# Patient Record
Sex: Male | Born: 1935 | Race: White | Hispanic: No | Smoking: Former smoker
Health system: Southern US, Community
[De-identification: ages and names within clinical notes are randomized; demographics above are authoritative.]

## PROBLEM LIST (undated history)

## (undated) ENCOUNTER — Emergency Department (HOSPITAL_BASED_OUTPATIENT_CLINIC_OR_DEPARTMENT_OTHER)
Admission: EM | Payer: Medicare Other | Source: Home / Self Care | Attending: Emergency Medicine | Admitting: Emergency Medicine

## (undated) DIAGNOSIS — E119 Type 2 diabetes mellitus without complications: Secondary | ICD-10-CM

## (undated) DIAGNOSIS — I10 Essential (primary) hypertension: Secondary | ICD-10-CM

## (undated) DIAGNOSIS — E113599 Type 2 diabetes mellitus with proliferative diabetic retinopathy without macular edema, unspecified eye: Secondary | ICD-10-CM

## (undated) DIAGNOSIS — I714 Abdominal aortic aneurysm, without rupture, unspecified: Secondary | ICD-10-CM

## (undated) DIAGNOSIS — Z794 Long term (current) use of insulin: Secondary | ICD-10-CM

## (undated) DIAGNOSIS — I4821 Permanent atrial fibrillation: Secondary | ICD-10-CM

## (undated) DIAGNOSIS — Z7901 Long term (current) use of anticoagulants: Secondary | ICD-10-CM

## (undated) DIAGNOSIS — Z951 Presence of aortocoronary bypass graft: Secondary | ICD-10-CM

## (undated) DIAGNOSIS — N401 Enlarged prostate with lower urinary tract symptoms: Secondary | ICD-10-CM

## (undated) DIAGNOSIS — H9190 Unspecified hearing loss, unspecified ear: Secondary | ICD-10-CM

## (undated) DIAGNOSIS — M5030 Other cervical disc degeneration, unspecified cervical region: Secondary | ICD-10-CM

## (undated) DIAGNOSIS — N471 Phimosis: Secondary | ICD-10-CM

## (undated) DIAGNOSIS — E785 Hyperlipidemia, unspecified: Secondary | ICD-10-CM

## (undated) DIAGNOSIS — I2510 Atherosclerotic heart disease of native coronary artery without angina pectoris: Secondary | ICD-10-CM

## (undated) DIAGNOSIS — R3916 Straining to void: Secondary | ICD-10-CM

## (undated) DIAGNOSIS — Z972 Presence of dental prosthetic device (complete) (partial): Secondary | ICD-10-CM

## (undated) DIAGNOSIS — K08109 Complete loss of teeth, unspecified cause, unspecified class: Secondary | ICD-10-CM

## (undated) DIAGNOSIS — Z923 Personal history of irradiation: Secondary | ICD-10-CM

## (undated) DIAGNOSIS — C61 Malignant neoplasm of prostate: Secondary | ICD-10-CM

## (undated) HISTORY — PX: KNEE ARTHROSCOPY: SUR90

## (undated) HISTORY — DX: Abdominal aortic aneurysm, without rupture, unspecified: I71.40

## (undated) HISTORY — DX: Hyperlipidemia, unspecified: E78.5

## (undated) HISTORY — DX: Abdominal aortic aneurysm, without rupture: I71.4

## (undated) HISTORY — PX: CARDIAC CATHETERIZATION: SHX172

## (undated) HISTORY — DX: Atherosclerotic heart disease of native coronary artery without angina pectoris: I25.10

## (undated) HISTORY — PX: CARPAL TUNNEL RELEASE: SHX101

## (undated) HISTORY — DX: Malignant neoplasm of prostate: C61

## (undated) HISTORY — PX: CATARACT EXTRACTION W/ INTRAOCULAR LENS  IMPLANT, BILATERAL: SHX1307

---

## 1988-11-23 HISTORY — PX: CORONARY ANGIOPLASTY: SHX604

## 1995-11-24 DIAGNOSIS — Z951 Presence of aortocoronary bypass graft: Secondary | ICD-10-CM

## 1995-11-24 HISTORY — DX: Presence of aortocoronary bypass graft: Z95.1

## 1995-11-24 HISTORY — PX: CORONARY ARTERY BYPASS GRAFT: SHX141

## 2004-01-18 ENCOUNTER — Ambulatory Visit (HOSPITAL_COMMUNITY): Admission: RE | Admit: 2004-01-18 | Discharge: 2004-01-18 | Payer: Self-pay | Admitting: Cardiology

## 2004-09-23 ENCOUNTER — Ambulatory Visit (HOSPITAL_COMMUNITY): Admission: RE | Admit: 2004-09-23 | Discharge: 2004-09-24 | Payer: Self-pay | Admitting: Ophthalmology

## 2004-12-26 ENCOUNTER — Ambulatory Visit (HOSPITAL_COMMUNITY): Admission: RE | Admit: 2004-12-26 | Discharge: 2004-12-26 | Payer: Self-pay | Admitting: Ophthalmology

## 2010-05-12 ENCOUNTER — Emergency Department (HOSPITAL_COMMUNITY): Admission: EM | Admit: 2010-05-12 | Discharge: 2010-05-12 | Payer: Self-pay | Admitting: Emergency Medicine

## 2010-06-24 ENCOUNTER — Ambulatory Visit: Payer: Self-pay | Admitting: Cardiology

## 2010-12-25 ENCOUNTER — Ambulatory Visit (INDEPENDENT_AMBULATORY_CARE_PROVIDER_SITE_OTHER): Payer: Medicare Other | Admitting: Cardiology

## 2010-12-25 DIAGNOSIS — E119 Type 2 diabetes mellitus without complications: Secondary | ICD-10-CM

## 2010-12-25 DIAGNOSIS — I2510 Atherosclerotic heart disease of native coronary artery without angina pectoris: Secondary | ICD-10-CM

## 2011-01-05 ENCOUNTER — Telehealth (INDEPENDENT_AMBULATORY_CARE_PROVIDER_SITE_OTHER): Payer: Self-pay | Admitting: *Deleted

## 2011-01-07 ENCOUNTER — Encounter: Payer: Self-pay | Admitting: Cardiology

## 2011-01-07 ENCOUNTER — Ambulatory Visit (HOSPITAL_COMMUNITY): Payer: Medicare Other | Attending: Cardiology

## 2011-01-07 DIAGNOSIS — R0609 Other forms of dyspnea: Secondary | ICD-10-CM

## 2011-01-07 DIAGNOSIS — R0989 Other specified symptoms and signs involving the circulatory and respiratory systems: Secondary | ICD-10-CM

## 2011-01-07 DIAGNOSIS — I2510 Atherosclerotic heart disease of native coronary artery without angina pectoris: Secondary | ICD-10-CM

## 2011-01-07 DIAGNOSIS — R079 Chest pain, unspecified: Secondary | ICD-10-CM

## 2011-01-07 HISTORY — PX: CARDIOVASCULAR STRESS TEST: SHX262

## 2011-01-14 NOTE — Progress Notes (Signed)
Summary: Nuclear Pre-Procedure  Phone Note Outgoing Call Call back at [facility] Phone [phone]   Call placed by: Stanton Kidney, EMT-P,  January 05, 2011 2:29 PM Call placed to: Patient Action Taken: Phone Call Completed Summary of Call: Reviewed information on [employer] Information Sheet (see scanned document for further details).  Spoke with the patient and his wife. Stanton Kidney, EMT-P  January 05, 2011 2:29 PM     Nuclear Med Background Indications for Stress Test: Evaluation for Ischemia, Graft Patency, PTCA Patency   History: Angioplasty, CABG, Heart Catheterization, Myocardial Perfusion Study  History Comments: '90 Angioplasty '97 CABG 2/10 MPS: EF=60%  Symptoms: Chest Pain, DOE    Nuclear Pre-Procedure Cardiac Risk Factors: Family History - CAD, History of Smoking, IDDM Type 2, Lipids

## 2011-01-14 NOTE — Assessment & Plan Note (Signed)
Summary: Cardiology Nuclear Testing  Nuclear Med Background Indications for Stress Test: Evaluation for Ischemia, Graft Patency, PTCA Patency   History: Angioplasty, CABG, Heart Catheterization, Myocardial Perfusion Study  History Comments: '90 Angioplasty '97 CABG 2/10 MPS: EF=60%  Symptoms: Chest Pain, DOE  Symptoms Comments: Last episode of CP > 1 month ago   Nuclear Pre-Procedure Cardiac Risk Factors: Family History - CAD, History of Smoking, IDDM Type 2, Lipids Caffeine/Decaff Intake: None NPO After: 9:00 PM Lungs: Clear IV 0.9% NS with Angio Cath: 18g     IV Site: R Antecubital IV Started by: Stanton Kidney, EMT-P Chest Size (in) 44     Height (in): 72 Weight (lb): 217 BMI: 29.54  Nuclear Med Study 1 or 2 day study:  1 day     Stress Test Type:  Treadmill/Lexiscan Reading MD:  Marca Ancona, MD     Referring MD:  Dr. Roger Shelter Resting Radionuclide:  Technetium 6m Tetrofosmin     Resting Radionuclide Dose:  11.0 mCi  Stress Radionuclide:  Technetium 24m Tetrofosmin     Stress Radionuclide Dose:  33.0 mCi   Stress Protocol Exercise Time (min):  2:00 min     Max HR:  98 bpm     Predicted Max HR:  146 bpm  Max Systolic BP: 183 mm Hg     Percent Max HR:  67.12 %     METS: 1.6 Rate Pressure Product:  53664  Lexiscan: 0.4 mg   Stress Test Technologist:  Bonnita Levan, RN     Nuclear Technologist:  Doyne Keel, CNMT  Rest Procedure  Myocardial perfusion imaging was performed at rest 45 minutes following the intravenous administration of Technetium 16m Tetrofosmin.  Stress Procedure  The patient received IV Lexiscan 0.4 mg over 15-seconds with concurrent low level exercise and then Technetium 32m Tetrofosmin was injected at 30-seconds while the patient continued walking one more minute.  There were no significant changes with Lexiscan.  Quantitative spect images were obtained after a 45 minute delay.  QPS Raw Data Images:  Normal; no motion artifact; normal  heart/lung ratio. Stress Images:  Normal homogeneous uptake in all areas of the myocardium. Rest Images:  Normal homogeneous uptake in all areas of the myocardium. Subtraction (SDS):  There is no evidence of scar or ischemia. Transient Ischemic Dilatation:  1.16  (Normal <1.22)  Lung/Heart Ratio:  0.28  (Normal <0.45)  Quantitative Gated Spect Images QGS EDV:  129 ml QGS ESV:  51 ml QGS EF:  60 % QGS cine images:  Normal wall motion.    Overall Impression  Exercise Capacity: Lexiscan with no exercise. BP Response: Normal blood pressure response. Clinical Symptoms: Lightheaded ECG Impression: No significant ST segment change suggestive of ischemia. Overall Impression: Normal stress nuclear study.  Appended Document: Cardiology Nuclear Testing copy sent to DR. TENNNAT  Appended Document: Cardiology Nuclear Testing COPY SENT TO DR. Deborah Chalk

## 2011-02-19 ENCOUNTER — Emergency Department (HOSPITAL_COMMUNITY)
Admission: EM | Admit: 2011-02-19 | Discharge: 2011-02-19 | Disposition: A | Discharge status: Home / Self Care | Payer: Medicare Other | Attending: Emergency Medicine | Admitting: Emergency Medicine

## 2011-02-19 ENCOUNTER — Emergency Department (HOSPITAL_COMMUNITY): Payer: Medicare Other

## 2011-02-19 DIAGNOSIS — S61209A Unspecified open wound of unspecified finger without damage to nail, initial encounter: Secondary | ICD-10-CM | POA: Insufficient documentation

## 2011-02-19 DIAGNOSIS — E119 Type 2 diabetes mellitus without complications: Secondary | ICD-10-CM | POA: Insufficient documentation

## 2011-02-19 DIAGNOSIS — W269XXA Contact with unspecified sharp object(s), initial encounter: Secondary | ICD-10-CM | POA: Insufficient documentation

## 2011-02-19 DIAGNOSIS — I10 Essential (primary) hypertension: Secondary | ICD-10-CM | POA: Insufficient documentation

## 2011-02-19 DIAGNOSIS — Z79899 Other long term (current) drug therapy: Secondary | ICD-10-CM | POA: Insufficient documentation

## 2011-02-19 DIAGNOSIS — Y92009 Unspecified place in unspecified non-institutional (private) residence as the place of occurrence of the external cause: Secondary | ICD-10-CM | POA: Insufficient documentation

## 2011-02-19 LAB — GLUCOSE, CAPILLARY: Glucose-Capillary: 157 mg/dL — ABNORMAL HIGH (ref 70–99)

## 2011-04-10 NOTE — Op Note (Signed)
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   POSTOPERATIVE DIAGNOSIS:  Chronic and recurrent vitreous hemorrhage left  eye.   OPERATION:  Pars plana vitrectomy using vitreous infusion suction cutter,  preretinal membrane peeling and excision and endolaser panretinal  photocoagulation.   SURGEON:  Guadelupe Sabin, M.D.   ASSISTANT:  Nurse.   ANESTHESIA:  General anesthesia.   OPHTHALMOSCOPY:  As previously described.   DESCRIPTION OF PROCEDURE:  After the patient was prepped and draped, a lid  speculum was inserted in the left eye.  The eye was turned downward and a  superior rectus traction suture placed.  A peritomy was performed adjacent  to the limbus from the 9 to 4 o'clock position.  The subconjunctival tissue  was cleaned and three sclerotomy sites prepared with the MVR blade 3.5 mm  from the limbus.  The 4 mm vitreous infusion terminal was secured in place  at the 4 o'clock position with the 5.0 gree Mersilene suture.  The  fiberoptic light pipe was inserted at the 2 o'clock position and the  handpiece of the vitreous infusion suction cutter at the 10 o'clock  position.  Slow vitreous infusion suction cutting were begun from an  anterior to posterior direction.  Dense vitreous hemorrhage was present.  This was slowly aspirated.  Scleral depression was used along the inferior  retina.  As the vitreous began to clear, the underlying retinal details were  seen.  There appeared to be a proliferative vascular stalk at the optic  nerve extending along the inferior temporal retinal blood vessel.   This was  slowly removed, peeled from the retinal service.  There appeared to be some  slight vitreous bleeding along the inferior temporal blood vessel.  The  pressure in the eye was slightly elevated to 30 mm and this gradually  stopped.  The surface blood was aspirated with the silicone tipped aspirator  tip.  The Endolaser photocoagulator was assembled and panretinal laser  photocoagulation applied.  A total number of burns 1150, duration 0.1  second, power 500 to 600 milliwatts.  Good panretinal burns were achieved.  Indirect ophthalmoscopy was then performed revealing no peripheral retinal  tears or detachment.  It was then elected to close.  The vitreous  instruments were withdrawn and the sclerotomy sites closed with 7.0  interrupted Vicryl sutures.  The conjunctiva was pulled back forward with  tenons capsule and closed with a running 7-0 Vicryl suture.  Depo Garamycin  and  dexamethasone were injected in the subtenon space inferiorly.  Maxitrol  and atropine  ointment were instilled in the conjunctival cul-de-sac.  A light patch and  protector shield were applied.  Duration of procedure and anesthesia  administration was one to one and a half hours.  The patient tolerated the  procedure well in general and left the operating room for the recovery room  and subsequently to the 23-hour observation unit.       HNJ/MEDQ  D:  09/23/2004  T:  09/23/2004  Job:  [id number]   cc:   Reather Littler, M.D.  [address], [address]  [place]  [place] [address]  Fax: [phone]

## 2011-04-10 NOTE — H&P (Signed)
Steve Bailey, Steve Bailey                ACCOUNT NO.:  [id number]   MEDICAL RECORD NO.:  [id number]          PATIENT TYPE:  OIB   LOCATION:  2899                         FACILITY:  [facility]   PHYSICIAN:  Guadelupe Sabin, M.D.DATE OF BIRTH:  07-31-1934   DATE OF ADMISSION:  09/23/2004  DATE OF DISCHARGE:                                HISTORY & PHYSICAL   REASON FOR ADMISSION:  This was a planned outpatient surgical admission of  this 77 year old insulin-dependent diabetic, admitted with a chronic  vitreous hemorrhage of the left eye and proliferative diabetic retinopathy  for vitrectomy surgery.   HISTORY OF PRESENT ILLNESS:  This patient has a long history of insulin-  dependent diabetes mellitus and secondary complications of diabetic  retinopathy.  He has been seen previously at the [facility] by  Dr. Alan Mulder and has had laser photocoagulation 2-3 times, of the left  eye.  Blurred vision has continued to be a problem.  The patient was first  seen in my office on March 10, 2000, at which time his vision was recorded  at 20/30 right eye, less than 20/400 left eye with correction.  Refraction  did not improve the vision.  Slit-lamp examination showed early cortical  cataract change greater in the right than left eye.  Dilated fundus  examination showed minimal retinopathy in the right eye and background  diabetic retinopathy with clinically significant macular edema in the left  eye with paramacular laser photocoagulation.  We felt that the patient's  condition was stable.  The patient was followed intermittently.  Recently,  however, he developed an acute vitreous hemorrhage in the left eye obscuring  his limited, but useful vision.  This has persisted without resolution and  the patient has elected to proceed with vitrectomy surgery at this time.  The patient was given oral discussion and printed information concerning the  procedure and its possible complications.  He  signed an informed consent and  arrangements were made for his outpatient admission at this time.   PAST MEDICAL HISTORY:  The patient is under the care of Dr. Reather Littler of  the [facility] and is felt to be in satisfactory  condition for the proposed surgery.  The patient also is being followed by  Dr. Deborah Chalk, his cardiologist, for arteriosclerotic heart disease, coronary  artery disease with bypass surgery, 1977.   MEDICATIONS:  The patient takes multiple medications including:  1.  Actos 45 mg daily.  2.  Humulin 70/30 at 19 units in the morning, 24 in the afternoon.  3.  The patient states that his blood sugars are relatively stable on this      regimen.   Other medications include:  1.  Vytorin.  2.  Norvasc.  3.  Toprol XL.  4.  Lisinopril.  5.  Aspirin 81 mg daily which the patient has discontinued for the      operation.   REVIEW OF SYSTEMS:  No current cardiorespiratory complaints.   PHYSICAL EXAMINATION:  VITAL SIGNS:  As recorded on admission, blood  pressure 122/69, pulse 57, respirations 18,  temperature 98.0.  GENERAL:  The patient is a pleasant well-nourished, well-developed 77-year-  old white male in no acute distress.  HEENT:  Eyes, visual acuity as noted above.  Detailed fundus examination at  this time shows a dense vitreous hemorrhage and retinal details are  obscured.  CHEST/LUNGS:  Clear to percussion and auscultation.  Central coronary artery  bypass surgical scar in the sternum.  ABDOMEN:  Negative.  EXTREMITIES:  Negative.   ADMISSION DIAGNOSIS:  1.  Chronic and recurrent vitreous hemorrhage at left eye.  2.  Proliferative diabetic retinopathy, left eye.  3.  Insulin-dependent diabetes mellitus.  4.  Arteriosclerotic heart disease.  5.  Coronary artery disease.  6.  Status post coronary artery bypass surgery.   SURGICAL PLAN:  Pars plana vitrectomy with endolaser photocoagulation and  possible membrane peeling as  needed.       HNJ/MEDQ  D:  09/23/2004  T:  09/23/2004  Job:  [id number]

## 2011-04-10 NOTE — Cardiovascular Report (Signed)
NAME:  EUAL, LINDSTROM                          ACCOUNT NO.:  [id number]   MEDICAL RECORD NO.:  [id number]                   PATIENT TYPE:  OIB   LOCATION:  2867                                 FACILITY:  [facility]   PHYSICIAN:  Colleen Can. Deborah Chalk, M.D.            DATE OF BIRTH:  Mar 24, 1934   DATE OF PROCEDURE:  01/18/2004  DATE OF DISCHARGE:                              CARDIAC CATHETERIZATION   HISTORY:  Mr. Matteucci had previous coronary artery bypass grafting in 1997.  He had an abnormal stress Cardiolite study.  He is referred for follow-up  evaluation.   PROCEDURE:  Left heart catheterization with selective coronary angiography,  left ventricular angiography, angiography of the saphenous vein grafts x3,  and angiography of the left internal mammary artery.   TYPE AND SITE OF ENTRY:  Percutaneous, right femoral artery with Perclose.   CATHETERS:  6-French 4-curved Judkins right and left coronary catheters; 6-  French pigtail ventriculographic catheter; right bypass graft catheter,  internal mammary graft catheter.   CONTRAST MATERIAL:  Omnipaque.   MEDICATIONS GIVEN PRIOR TO THE PROCEDURE:  Valium 10 mg p.o.   MEDICATIONS GIVEN DURING THE PROCEDURE:  Versed 2 mg IV, Ancef 1 g IV.   COMMENTS:  Angio-Seal was used.   HEMODYNAMIC DATA:  1. The aortic pressure was 143/71.  2. The LV was 135/11/19.  3. There was no aortic valve gradient noted on pullback.   ANGIOGRAPHIC DATA:  Left ventricular angiogram was performed in the RAO  position.  Overall cardiac size and silhouette are normal.  Global ejection  fraction is within normal range.  It is approximately 55%.  There is very  mild anterior hypokinesis.   1. Saphenous vein graft to the right coronary artery is widely patent with a     smooth body of the graft and nice insertion with good distal runoff.     There is some mild to moderate diffuse atherosclerosis in the right     coronary artery, but overall it appears  to be  satisfactory flow with no     significant obstructive disease.  There is mild diffuse distal disease in     this distribution.  2. Left internal mammary artery to the LAD is widely patent with a nice     insertion and good distal runoff.  3. Saphenous vein graft to the diagonal system has a smooth body of the     graft and a nice insertion.  It is a relatively small distribution but     excellent distal runoff.  4. Saphenous vein graft to the obtuse marginal is widely patent.  There is     satisfactory flow into the graft.  There is retrograde filling of a more     proximal and relatively large obtuse marginal.  This could be a potential     area of ischemia at some point in time but currently appears to be  well     perfused.  5. Right coronary artery:  The native right coronary artery is severely and     diffusely diseased in a segmental fashion proximally.  However, there is     antegrade flow and subsequent retrograde flow into the saphenous vein     graft.  The right coronary artery in its proximal segment is diffusely     diseased in the 70-80% segmental range.  6. Left coronary system:  The left main coronary artery is basically normal     with the left circumflex being totally occluded in its proximal portions.     The left anterior descending is totally occluded after a septal     perforating branch.  There appears to be severe stenosis before the     second diagonal, and the second diagonal is relatively small.  It appears     that the distal left anterior descending is totally occluded.  The vessel     would be too small for any attempts at percutaneous antegrade     revascularization, but it is also felt that it is not needed at this     point in time.   OVERALL IMPRESSION:  1. Severe native coronary artery disease with totally occluded left     circumflex, totally occluded left anterior descending, severe disease in     a small second diagonal vessel, and severe segmental  disease in the right     coronary artery.  2. Patent saphenous vein graft x3 with patent left internal mammary graft.  3. Reasonably well-preserved left ventricular function with very minimal     anterior hypokinesis.   DISCUSSION:  It is felt that Mr. Lichtman should have an excellent outlook at  this point in time.  His saphenous vein grafts appear to be in excellent  condition with no obvious atherosclerotic disease.  The distal vessels are  satisfactory and have good runoff.  It is overall felt that his prognosis  should be good.                                               Colleen Can. Deborah Chalk, M.D.    SNT/MEDQ  D:  01/18/2004  T:  01/20/2004  Job:  [id number]

## 2011-04-10 NOTE — H&P (Signed)
NAME:  Steve Bailey, Steve Bailey                         ACCOUNT NO.:  [id number]   MEDICAL RECORD NO.:  [id number]                   PATIENT TYPE:  OIB   LOCATION:                                       FACILITY:  [facility]   PHYSICIAN:  Colleen Can. Deborah Chalk, M.D.            DATE OF BIRTH:  July 13, 1934   DATE OF ADMISSION:  01/18/2004  DATE OF DISCHARGE:                                HISTORY & PHYSICAL   CHIEF COMPLAINT:  None.   HISTORY OF PRESENT ILLNESS:  Mr. Paskett is a 77 year old male who has  multiple medical problems.  He has had previous bypass grafting that dates  back to 1997.  He has basically done well since that time.  He has  clinically done well without recurrence of chest pain.  His exercise  tolerance has basically been satisfactory.  He was referred for adenosine  Cardiolite as part of his cardiac evaluation which was performed on January 09, 2004.  With this he demonstrated inferior posterior and basal  hypoperfusion.  He is now referred for repeat cardiac catheterization.  His  ejection fraction was 58%.   PAST MEDICAL HISTORY:  1. Known ischemic heart disease with remote history of angioplasty to the     LAD in 1990, previous coronary artery bypass grafting x4 in January 1997,     with left internal mammary artery to the LAD, saphenous vein grafts to     the first diagonal, saphenous vein grafts to the OM and saphenous vein     grafts to the distal right coronary.  His last Cardiolite study is as     noted above.  2. Insulin-dependent diabetes mellitus, followed by Dr. Reather Littler.  3. Weight gain.  4. History of hypertension.  5. Hyperlipidemia.   ALLERGIES:  No known drug allergies.   CURRENT MEDICATIONS:  1. Vytorin 10-40 daily.  2. Lisinopril HCT 20/12.5 daily.  3. Vitamin E daily.  4. Baby aspirin daily.  5. Beta keratin daily.  6. Norvasc 10 mg daily.  7. Glucophage 500 daily.  8. Toprol XL 100 mg daily.  9. Humulin 70/30 insulin 17 units in the morning and  22 units in the     evening.   FAMILY HISTORY:  Noncontributory.   SOCIAL HISTORY:  He has no current alcohol or tobacco.   REVIEW OF SYSTEMS:  Basically as noted above and is otherwise unremarkable.   PHYSICAL EXAMINATION:  VITAL SIGNS:  Weight 221 pounds.  Blood pressure  130/80 sitting, 120/80 standing. Heart rate 72 and regular.  Respirations  18.  Afebrile.  SKIN:  Warm and dry.  Color is unremarkable.  HEENT:  Unremarkable.  LUNGS:  Basically clear.  HEART:  Regular rhythm.  ABDOMEN:  Obese.  Soft, positive bowel sounds, nontender.  EXTREMITIES:  No edema.   LABORATORY DATA:  Pertinent laboratory studies are pending.   IMPRESSION:  1. Abnormal adenosine Cardiolite study.  2. Known coronary artery disease with previous bypass grafting dating back     to 1997.  3. Diabetes mellitus.  4. Hypertension.  5. Hyperlipidemia.   PLAN:  We will proceed with repeat cardiac catheterization.  The procedure  has been reviewed in full detail and he is willing to proceed on Friday,  01/18/2004.      Juanell Fairly C. Earl Gala, N.P.                 Colleen Can. Deborah Chalk, M.D.    LCO/MEDQ  D:  01/16/2004  T:  01/16/2004  Job:  [id number]   cc:   Reather Littler, M.D.  [address]  [place]  [place] [address]  Fax: [phone]

## 2011-04-10 NOTE — Op Note (Signed)
Steve Bailey, Steve Bailey                ACCOUNT NO.:  [id number]   MEDICAL RECORD NO.:  [id number]          PATIENT TYPE:  OIB   LOCATION:  2899                         FACILITY:  [facility]   PHYSICIAN:  Guadelupe Sabin, M.D.DATE OF BIRTH:  January 14, 1934   DATE OF PROCEDURE:  12/26/2004  DATE OF DISCHARGE:                                 OPERATIVE REPORT   PREOPERATIVE DIAGNOSIS:  Senile nuclear cataract left eye.   POSTOPERATIVE DIAGNOSIS:  Senile nuclear cataract left eye.   NAME OF OPERATION:  Planned extracapsular cataract extraction -  phacoemulsification, primary insertion of posterior chamber intraocular lens  implant.   SURGEON:  Guadelupe Sabin, M.D.   ASSISTANT:  Nurse.   ANESTHESIA:  Local 4% Xylocaine, 0.75 Marcaine retrobulbar block with Wydase  added, topical tetracaine, intraocular Xylocaine, anesthesia standby  required in this cardiac patient.   OPERATIVE PROCEDURE:  After the patient was prepped and draped. A lid  speculum was inserted in the left eye. The eye was turned downward and the  superior rectus traction suture placed. Schiotz tonometry was recorded at 10  scale units with a 5.5 g weight. A peritomy was performed adjacent to the  limbus from the 11 to 1 o'clock position. The corneoscleral junction was  cleaned and a corneoscleral groove made with a 45 degree Superblade. The  anterior chamber was then entered with the 2.5 mm diamond keratome at the 12  o'clock position and the 15 degrees blade at the 2:30 position.  Using a  bent 26- gauge needle on OcuCoat syringe, a circular capsulorrhexis was  begun and then completed with the Grabow forceps. Hydrodissection and  hydrodelineation were performed using 1% Xylocaine.  This flipped the  nucleus in a more vertical position.  The 30 degree phacoemulsification tip  was then inserted and the nucleus emulsified with back cracking and  fragmentation with the Bechert pick.  Total ultrasonic time 43 seconds,  average  power level 20%, total amount of fluid used 40 mL.  Following  removal of the nucleus, the residual cortex was aspirated with the  irrigation-aspiration tip. The posterior capsule appeared clear and intact  with a brilliant red fundus reflex.  It was therefore elected to insert an  [employer] SI 40 NB silicone three-piece posterior chamber  intraocular lens implant, diopter strength plus 20.50 diopters. This was  inserted into the anterior chamber with the McDonald forceps and then  centered into the capsular bag using the [facility] lens rotator. The lens  appeared to be well-centered. The  Provisc and [place]oat which had been used  intermittently during the procedure were aspirated replaced with balanced  salt solution and Miochol ophthalmic solution. The operative incisions  appeared to be self-sealing. It was, however, elected to place a single 10-0  interrupted nylon suture across the 12 o'clock incision to insure closure  and to prevent endophthalmitis. Maxitrol ointment was instilled in the  conjunctival cul-de-sac and a light patch and protector shield applied.  Duration of procedure and anesthesia administration, one to 1-1/4  hours.  The patient tolerated the procedure well in general, left  the operating room  for the recovery room in good condition.      HNJ/MEDQ  D:  12/26/2004  T:  12/26/2004  Job:  [id number]

## 2011-04-10 NOTE — H&P (Signed)
Steve Bailey, Steve Bailey                ACCOUNT NO.:  [id number]   MEDICAL RECORD NO.:  [id number]          PATIENT TYPE:  OIB   LOCATION:  2899                         FACILITY:  [facility]   PHYSICIAN:  Guadelupe Sabin, M.D.DATE OF BIRTH:  1934/04/10   DATE OF ADMISSION:  12/26/2004  DATE OF DISCHARGE:                                HISTORY & PHYSICAL   This was a planned outpatient readmission of this 77 year old white male  insulin-dependent diabetic admitted for cataract implant surgery of the left  eye.   PRESENT ILLNESS:  This patient has a past history of insulin-dependent  diabetes mellitus with secondary complications of diabetic retinopathy. He  has previously had laser photocoagulation to the left eye and was admitted  on September 23, 2004 for a pars plana vitrectomy with additional endolaser  photocoagulation of the left eye. Postoperatively the patient did well with  clearing of his vitreous hemorrhage and good laser photocoagulation. He,  however, gradually developed a nuclear cataract and is now admitted for  cataract implant surgery which has reduced his vision significantly. Patient  was given oral discussion and printed information concerning the procedure  and its possible complications. He signed an informed consent and  arrangements were made for his outpatient readmission at this time.   PAST MEDICAL HISTORY:  See old chart. The patient is under the care of Dr.  Reather Littler.   CURRENT MEDICATIONS:  Insulin-dependent diabetes mellitus. Other medications  under the direction of Dr. Lucianne Muss. The patient has been given Flomax or  associated compound of medication for urinary retention which developed  during the postoperative period of his previous admission in November. Other  medications include Vytorin, Norvasc, Toprol XL, lisinopril, and 81 mg  aspirin, which he has discontinued for a few days prior to the surgery.   ALLERGIES:  No known allergies.   REVIEW OF  SYSTEMS:  No cardiorespiratory complaints.   PHYSICAL EXAMINATION:  VITAL SIGNS: As recorded on admission:  Blood  pressure 136/79, temperature 97.1, heart rate 63, respirations 18.  GENERAL: This patient is a pleasant, well-nourished, well-developed 70-year-  old white male in no acute distress.  HEENT:  EYES:  Visual acuity 20/30, +2 right eye with correction, finger  counting left eye. Applanation tonometry: 16 mm right eye, 22 left eye. Slit  lamp examination: The eyes are white and clear with a clear cornea, deep and  clear anterior chamber. A nuclear and posterior subcapsular cataract is  present in the left eye. Fundus evaluation is difficult but the vitreous is  clear, retina attached with good panretinal laser photocoagulation.  CHEST: Old coronary artery bypass central scar.  LUNGS: Clear to percussion and auscultation.  HEART: Normal sinus rhythm. No cardiomegaly. No murmurs.  ABDOMEN: Negative.  EXTREMITIES: Negative.   ADMISSION DIAGNOSIS:  Nuclear and posterior subcapsular cataract left eye.  Status post posterior vitrectomy for proliferative diabetic retinopathy with  vitreous hemorrhage left eye.   SURGICAL PLAN:  Cataract implant surgery with intraocular lens implantation.   ENCLOSURE:  Please send Dr. Lucianne Muss copies of these notes and any associated  laboratory data, EKG,  or chest x-ray material.      HNJ/MEDQ  D:  12/26/2004  T:  12/26/2004  Job:  [id number]   cc:   Reather Littler, M.D.  [address], [address]  [place]  [place] [address]  Fax: [phone]

## 2011-12-29 DIAGNOSIS — E113599 Type 2 diabetes mellitus with proliferative diabetic retinopathy without macular edema, unspecified eye: Secondary | ICD-10-CM | POA: Insufficient documentation

## 2011-12-29 DIAGNOSIS — E11359 Type 2 diabetes mellitus with proliferative diabetic retinopathy without macular edema: Secondary | ICD-10-CM | POA: Diagnosis not present

## 2011-12-29 DIAGNOSIS — E119 Type 2 diabetes mellitus without complications: Secondary | ICD-10-CM | POA: Diagnosis not present

## 2012-01-29 DIAGNOSIS — I10 Essential (primary) hypertension: Secondary | ICD-10-CM | POA: Diagnosis not present

## 2012-01-29 DIAGNOSIS — E785 Hyperlipidemia, unspecified: Secondary | ICD-10-CM | POA: Diagnosis not present

## 2012-01-29 DIAGNOSIS — E119 Type 2 diabetes mellitus without complications: Secondary | ICD-10-CM | POA: Diagnosis not present

## 2012-02-17 ENCOUNTER — Other Ambulatory Visit: Payer: Self-pay | Admitting: *Deleted

## 2012-02-17 MED ORDER — METOPROLOL SUCCINATE ER 50 MG PO TB24: 50.0000 mg | ORAL_TABLET | Freq: Every day | ORAL | Status: DC

## 2012-02-17 NOTE — Telephone Encounter (Signed)
Pt aware of new location and appt. Pt aware he has to keep Appointment to get more refills on his Toprol. Pt needs appointment then refill can be made Fax Received. Refill Completed. Steve Bailey (R.M.A)  Fax Received. Refill Completed. [name] (R.M.A)

## 2012-02-18 ENCOUNTER — Other Ambulatory Visit: Payer: Self-pay | Admitting: *Deleted

## 2012-03-10 ENCOUNTER — Encounter: Payer: Self-pay | Admitting: *Deleted

## 2012-03-11 ENCOUNTER — Encounter: Payer: Self-pay | Admitting: Cardiovascular Disease

## 2012-03-11 ENCOUNTER — Ambulatory Visit (INDEPENDENT_AMBULATORY_CARE_PROVIDER_SITE_OTHER): Payer: Medicare Other | Admitting: Cardiovascular Disease

## 2012-03-11 VITALS — BP 130/70 | HR 69 | Resp 18 | Ht 72.0 in | Wt 205.8 lb

## 2012-03-11 DIAGNOSIS — E1165 Type 2 diabetes mellitus with hyperglycemia: Secondary | ICD-10-CM | POA: Insufficient documentation

## 2012-03-11 DIAGNOSIS — I2510 Atherosclerotic heart disease of native coronary artery without angina pectoris: Secondary | ICD-10-CM | POA: Insufficient documentation

## 2012-03-11 DIAGNOSIS — E785 Hyperlipidemia, unspecified: Secondary | ICD-10-CM | POA: Diagnosis not present

## 2012-03-11 DIAGNOSIS — E119 Type 2 diabetes mellitus without complications: Secondary | ICD-10-CM

## 2012-03-11 DIAGNOSIS — E1169 Type 2 diabetes mellitus with other specified complication: Secondary | ICD-10-CM | POA: Insufficient documentation

## 2012-03-11 MED ORDER — ATORVASTATIN CALCIUM 40 MG PO TABS: 40.0000 mg | ORAL_TABLET | Freq: Every day | ORAL | Status: DC

## 2012-03-11 MED ORDER — EZETIMIBE 10 MG PO TABS: 10.0000 mg | ORAL_TABLET | Freq: Every day | ORAL | Status: DC

## 2012-03-11 NOTE — Assessment & Plan Note (Signed)
We will discontinue his Vytorin start him on Lipitor 40 mg a day and Zetia. Recheck his lipids in 3 months.

## 2012-03-11 NOTE — Assessment & Plan Note (Signed)
Steve Bailey is doing fairly well. He's not having any complaints. He's been out plowing in his garden. He's not had any episodes of chest pain or shortness breath.  He's currently on Vytorin 10/80. I have recommended that we discontinue this and start on Zetia 10 mg and atorvastatin 40 mg a day. We'll check his lipids, basic metabolic profile, and hepatic profile in 3 months. I will see him back in 6 months for an office visit.

## 2012-03-11 NOTE — Progress Notes (Signed)
Steve Bailey Date of Birth  14-Jun-1934 [facility]     [place] [facility]  [address]    [address]   [address] [place], [place]  [address]    [place], [place]  [address] [phone]  Fax  [phone]  [phone]  Fax [phone]  Problem list: 1. Coronary artery disease-status post coronary artery bypass grafting in 1997 2. Diabetes mellitus 3. Hyperlipidemia  History of Present Illness:  Steve Bailey is a 78 year old gentleman with the above-noted medical history. He's done well since he was last seen in February, 2012.  Current Outpatient Prescriptions on File Prior to Visit  Medication Sig Dispense Refill  . alfuzosin (UROXATRAL) 10 MG 24 hr tablet Take 10 mg by mouth daily.      Marland Kitchen aspirin 81 MG tablet Take 81 mg by mouth daily.      Marland Kitchen ezetimibe-simvastatin (VYTORIN) 10-80 MG per tablet Take 1 tablet by mouth at bedtime.      . finasteride (PROSCAR) 5 MG tablet Take 5 mg by mouth daily.      . Insulin Isophane & Regular (HUMULIN 70/30 [place]) Inject 18 Units into the skin daily. Take as Directed.      Marland Kitchen lisinopril (PRINIVIL,ZESTRIL) 20 MG tablet Take 20 mg by mouth daily.      . metoprolol succinate (TOPROL-XL) 50 MG 24 hr tablet Take 1 tablet (50 mg total) by mouth daily. Take with or immediately following a meal.  90 tablet  0  . pioglitazone (ACTOS) 45 MG tablet Take 45 mg by mouth daily.        No Known Allergies  Past Medical History  Diagnosis Date  . DM (diabetes mellitus)   . Hyperlipidemia   . Tobacco abuse   . CAD (coronary artery disease)     asymptomatic    Past Surgical History  Procedure Date  . Coronary artery bypass graft 11-1995    shows distal LAD disease, without recurrent symptoms of angina with questionable mild apical ischemia but with normal EF   . Angioplasty     of his left anterior descending previously in 1990.  . Cardiovascular stress test 12-2008    showed EF 60%  . Cataract extraction 2010  . Knee surgery    left  . Carpal tunnel release     right hand    History  Smoking status  . Former Smoker  Smokeless tobacco  . Former Neurosurgeon  . Quit date: 11/23/1978    History  Alcohol Use No    No family history on file.  Reviw of Systems:  Reviewed in the HPI.  All other systems are negative.  Physical Exam: Blood pressure 130/70, pulse 69, resp. rate 18, height 6' (1.829 m), weight 205 lb 12.8 oz (93.35 kg). General: Well developed, well nourished, in no acute distress.  Head: Normocephalic, atraumatic, sclera non-icteric, mucus membranes are moist,   Neck: Supple. Carotids are 2 + without bruits. No JVD  Lungs: Clear bilaterally to auscultation.  Heart: regular rate.  normal  S1 S2. No murmurs, gallops or rubs.  Abdomen: Soft, non-tender, non-distended with normal bowel sounds. No hepatomegaly. No rebound/guarding. No masses.  Msk:  Strength and tone are normal  Extremities: No clubbing or cyanosis. No edema.  Distal pedal pulses are 2+ and equal bilaterally.  Neuro: Alert and oriented X 3. Moves all extremities spontaneously.  Psych:  Responds to questions appropriately with a normal affect.  ECG: 03/11/2012-normal sinus rhythm at 69 beats  a minute. Normal EKG.  Assessment / Plan:

## 2012-03-11 NOTE — Patient Instructions (Signed)
Your physician wants you to follow-up in: 6 MONTHS  You will receive a reminder letter in the mail two months in advance. If you don't receive a letter, please call our office to schedule the follow-up appointment.  Your physician has recommended you make the following change in your medication:   STOP VYTORIN START ZETIA 10 MG DAILY FOR CHOLESTEROL START ATORVASTATIN 40 MG DAILY FOR CHOLESTEROL  Your physician recommends that you return for a FASTING lipid profile: 3 MONTHS//APP GIVEN

## 2012-03-29 ENCOUNTER — Encounter: Payer: Self-pay | Admitting: *Deleted

## 2012-05-23 HISTORY — PX: ESOPHAGOGASTRODUODENOSCOPY (EGD) WITH ESOPHAGEAL DILATION: SHX5812

## 2012-06-02 DIAGNOSIS — E785 Hyperlipidemia, unspecified: Secondary | ICD-10-CM | POA: Diagnosis not present

## 2012-06-02 DIAGNOSIS — E119 Type 2 diabetes mellitus without complications: Secondary | ICD-10-CM | POA: Diagnosis not present

## 2012-06-07 DIAGNOSIS — E119 Type 2 diabetes mellitus without complications: Secondary | ICD-10-CM | POA: Diagnosis not present

## 2012-06-07 DIAGNOSIS — I10 Essential (primary) hypertension: Secondary | ICD-10-CM | POA: Diagnosis not present

## 2012-06-07 DIAGNOSIS — E538 Deficiency of other specified B group vitamins: Secondary | ICD-10-CM | POA: Diagnosis not present

## 2012-06-07 DIAGNOSIS — R809 Proteinuria, unspecified: Secondary | ICD-10-CM | POA: Diagnosis not present

## 2012-06-07 DIAGNOSIS — E785 Hyperlipidemia, unspecified: Secondary | ICD-10-CM | POA: Diagnosis not present

## 2012-06-11 DIAGNOSIS — K449 Diaphragmatic hernia without obstruction or gangrene: Secondary | ICD-10-CM | POA: Diagnosis not present

## 2012-06-11 DIAGNOSIS — K222 Esophageal obstruction: Secondary | ICD-10-CM | POA: Diagnosis not present

## 2012-06-11 DIAGNOSIS — I10 Essential (primary) hypertension: Secondary | ICD-10-CM | POA: Diagnosis not present

## 2012-06-11 DIAGNOSIS — R079 Chest pain, unspecified: Secondary | ICD-10-CM | POA: Diagnosis not present

## 2012-06-11 DIAGNOSIS — T18108A Unspecified foreign body in esophagus causing other injury, initial encounter: Secondary | ICD-10-CM | POA: Diagnosis not present

## 2012-06-11 DIAGNOSIS — E119 Type 2 diabetes mellitus without complications: Secondary | ICD-10-CM | POA: Diagnosis not present

## 2012-06-11 DIAGNOSIS — Z794 Long term (current) use of insulin: Secondary | ICD-10-CM | POA: Diagnosis not present

## 2012-06-11 DIAGNOSIS — Z951 Presence of aortocoronary bypass graft: Secondary | ICD-10-CM | POA: Diagnosis not present

## 2012-06-15 ENCOUNTER — Other Ambulatory Visit (INDEPENDENT_AMBULATORY_CARE_PROVIDER_SITE_OTHER): Payer: Medicare Other

## 2012-06-15 DIAGNOSIS — E785 Hyperlipidemia, unspecified: Secondary | ICD-10-CM

## 2012-06-15 DIAGNOSIS — E119 Type 2 diabetes mellitus without complications: Secondary | ICD-10-CM

## 2012-06-15 DIAGNOSIS — I2510 Atherosclerotic heart disease of native coronary artery without angina pectoris: Secondary | ICD-10-CM

## 2012-06-15 LAB — LIPID PANEL
Cholesterol: 94 mg/dL (ref 0–200)
HDL: 41.6 mg/dL (ref >39.00)
LDL Cholesterol: 41 mg/dL (ref 0–99)
Total CHOL/HDL Ratio: 2
Triglycerides: 58 mg/dL (ref 0.0–149.0)
VLDL: 11.6 mg/dL (ref 0.0–40.0)

## 2012-06-15 LAB — HEPATIC FUNCTION PANEL
ALT: 23 U/L (ref 0–53)
AST: 19 U/L (ref 0–37)
Albumin: 3.6 g/dL (ref 3.5–5.2)
Alkaline Phosphatase: 72 U/L (ref 39–117)
Bilirubin, Direct: 0.2 mg/dL (ref 0.0–0.3)
Total Bilirubin: 1.6 mg/dL — ABNORMAL HIGH (ref 0.3–1.2)
Total Protein: 6.6 g/dL (ref 6.0–8.3)

## 2012-06-15 LAB — BASIC METABOLIC PANEL
BUN: 21 mg/dL (ref 6–23)
CO2: 27 mEq/L (ref 19–32)
Calcium: 8.7 mg/dL (ref 8.4–10.5)
Chloride: 104 mEq/L (ref 96–112)
Creatinine, Ser: 1.3 mg/dL (ref 0.4–1.5)
GFR: 56.03 mL/min — ABNORMAL LOW (ref >60.00)
Glucose, Bld: 167 mg/dL — ABNORMAL HIGH (ref 70–99)
Potassium: 3.7 mEq/L (ref 3.5–5.1)
Sodium: 138 mEq/L (ref 135–145)

## 2012-06-23 DIAGNOSIS — N400 Enlarged prostate without lower urinary tract symptoms: Secondary | ICD-10-CM | POA: Diagnosis not present

## 2012-06-23 DIAGNOSIS — R972 Elevated prostate specific antigen [PSA]: Secondary | ICD-10-CM | POA: Diagnosis not present

## 2012-07-22 DIAGNOSIS — H31019 Macula scars of posterior pole (postinflammatory) (post-traumatic), unspecified eye: Secondary | ICD-10-CM | POA: Diagnosis not present

## 2012-07-22 DIAGNOSIS — E11311 Type 2 diabetes mellitus with unspecified diabetic retinopathy with macular edema: Secondary | ICD-10-CM | POA: Insufficient documentation

## 2012-07-22 DIAGNOSIS — E1139 Type 2 diabetes mellitus with other diabetic ophthalmic complication: Secondary | ICD-10-CM | POA: Diagnosis not present

## 2012-07-22 DIAGNOSIS — E11359 Type 2 diabetes mellitus with proliferative diabetic retinopathy without macular edema: Secondary | ICD-10-CM | POA: Diagnosis not present

## 2012-07-26 DIAGNOSIS — Z23 Encounter for immunization: Secondary | ICD-10-CM | POA: Diagnosis not present

## 2012-07-29 DIAGNOSIS — R972 Elevated prostate specific antigen [PSA]: Secondary | ICD-10-CM | POA: Diagnosis not present

## 2012-09-02 DIAGNOSIS — C61 Malignant neoplasm of prostate: Secondary | ICD-10-CM

## 2012-09-02 DIAGNOSIS — R972 Elevated prostate specific antigen [PSA]: Secondary | ICD-10-CM | POA: Diagnosis not present

## 2012-09-02 DIAGNOSIS — IMO0002 Reserved for concepts with insufficient information to code with codable children: Secondary | ICD-10-CM | POA: Diagnosis not present

## 2012-09-02 HISTORY — DX: Malignant neoplasm of prostate: C61

## 2012-09-08 ENCOUNTER — Other Ambulatory Visit: Payer: Self-pay | Admitting: *Deleted

## 2012-09-08 MED ORDER — ATORVASTATIN CALCIUM 40 MG PO TABS: 40.0000 mg | ORAL_TABLET | Freq: Every day | ORAL | Status: DC

## 2012-09-08 MED ORDER — EZETIMIBE 10 MG PO TABS: 10.0000 mg | ORAL_TABLET | Freq: Every day | ORAL | Status: DC

## 2012-09-08 NOTE — Telephone Encounter (Signed)
Fax Received. Refill Completed. Miliani Deike Chowoe (R.M.A)   

## 2012-09-08 NOTE — Telephone Encounter (Signed)
Fax Received. Refill Completed. Briggs Edelen Chowoe (R.M.A)   

## 2012-09-21 DIAGNOSIS — C61 Malignant neoplasm of prostate: Secondary | ICD-10-CM | POA: Diagnosis not present

## 2012-09-23 ENCOUNTER — Other Ambulatory Visit (HOSPITAL_COMMUNITY): Payer: Self-pay | Admitting: Urology

## 2012-09-23 DIAGNOSIS — C61 Malignant neoplasm of prostate: Secondary | ICD-10-CM

## 2012-09-23 DIAGNOSIS — I4821 Permanent atrial fibrillation: Secondary | ICD-10-CM

## 2012-09-23 HISTORY — DX: Permanent atrial fibrillation: I48.21

## 2012-09-26 ENCOUNTER — Other Ambulatory Visit (HOSPITAL_COMMUNITY): Payer: Medicare Other

## 2012-09-28 ENCOUNTER — Other Ambulatory Visit (HOSPITAL_COMMUNITY): Payer: Medicare Other

## 2012-10-03 ENCOUNTER — Other Ambulatory Visit (HOSPITAL_COMMUNITY): Payer: Self-pay | Admitting: Urology

## 2012-10-03 ENCOUNTER — Ambulatory Visit (HOSPITAL_COMMUNITY)
Admission: RE | Admit: 2012-10-03 | Discharge: 2012-10-03 | Disposition: A | Discharge status: Home / Self Care | Payer: Medicare Other | Source: Ambulatory Visit | Attending: Urology | Admitting: Urology

## 2012-10-03 ENCOUNTER — Other Ambulatory Visit (HOSPITAL_COMMUNITY): Payer: Medicare Other

## 2012-10-03 DIAGNOSIS — C61 Malignant neoplasm of prostate: Secondary | ICD-10-CM | POA: Insufficient documentation

## 2012-10-03 DIAGNOSIS — Z135 Encounter for screening for eye and ear disorders: Secondary | ICD-10-CM | POA: Diagnosis not present

## 2012-10-03 MED ORDER — GADOBENATE DIMEGLUMINE 529 MG/ML IV SOLN
19.0000 mL | Freq: Once | INTRAVENOUS | Status: AC | PRN
  Administered 2012-10-03: 19 mL via INTRAVENOUS

## 2012-10-06 DIAGNOSIS — E538 Deficiency of other specified B group vitamins: Secondary | ICD-10-CM | POA: Diagnosis not present

## 2012-10-06 DIAGNOSIS — I10 Essential (primary) hypertension: Secondary | ICD-10-CM | POA: Diagnosis not present

## 2012-10-06 DIAGNOSIS — C61 Malignant neoplasm of prostate: Secondary | ICD-10-CM | POA: Diagnosis not present

## 2012-10-06 DIAGNOSIS — E119 Type 2 diabetes mellitus without complications: Secondary | ICD-10-CM | POA: Diagnosis not present

## 2012-10-10 ENCOUNTER — Ambulatory Visit (HOSPITAL_COMMUNITY): Payer: Medicare Other | Attending: Cardiology | Admitting: Radiology

## 2012-10-10 ENCOUNTER — Encounter: Payer: Self-pay | Admitting: Nurse Practitioner

## 2012-10-10 ENCOUNTER — Telehealth: Payer: Self-pay | Admitting: *Deleted

## 2012-10-10 ENCOUNTER — Other Ambulatory Visit (HOSPITAL_COMMUNITY): Payer: Self-pay | Admitting: Nurse Practitioner

## 2012-10-10 ENCOUNTER — Ambulatory Visit (INDEPENDENT_AMBULATORY_CARE_PROVIDER_SITE_OTHER): Payer: Medicare Other | Admitting: Nurse Practitioner

## 2012-10-10 VITALS — BP 180/100 | HR 72 | Resp 20 | Ht 72.0 in | Wt 202.0 lb

## 2012-10-10 DIAGNOSIS — R809 Proteinuria, unspecified: Secondary | ICD-10-CM | POA: Diagnosis not present

## 2012-10-10 DIAGNOSIS — I517 Cardiomegaly: Secondary | ICD-10-CM | POA: Insufficient documentation

## 2012-10-10 DIAGNOSIS — F17200 Nicotine dependence, unspecified, uncomplicated: Secondary | ICD-10-CM | POA: Insufficient documentation

## 2012-10-10 DIAGNOSIS — I10 Essential (primary) hypertension: Secondary | ICD-10-CM | POA: Diagnosis not present

## 2012-10-10 DIAGNOSIS — I2510 Atherosclerotic heart disease of native coronary artery without angina pectoris: Secondary | ICD-10-CM | POA: Diagnosis not present

## 2012-10-10 DIAGNOSIS — E038 Other specified hypothyroidism: Secondary | ICD-10-CM | POA: Insufficient documentation

## 2012-10-10 DIAGNOSIS — I059 Rheumatic mitral valve disease, unspecified: Secondary | ICD-10-CM | POA: Diagnosis not present

## 2012-10-10 DIAGNOSIS — E785 Hyperlipidemia, unspecified: Secondary | ICD-10-CM | POA: Diagnosis not present

## 2012-10-10 DIAGNOSIS — I4891 Unspecified atrial fibrillation: Secondary | ICD-10-CM

## 2012-10-10 DIAGNOSIS — E119 Type 2 diabetes mellitus without complications: Secondary | ICD-10-CM | POA: Diagnosis not present

## 2012-10-10 DIAGNOSIS — E538 Deficiency of other specified B group vitamins: Secondary | ICD-10-CM | POA: Diagnosis not present

## 2012-10-10 HISTORY — PX: TRANSTHORACIC ECHOCARDIOGRAM: SHX275

## 2012-10-10 MED ORDER — LISINOPRIL 20 MG PO TABS: 20.0000 mg | ORAL_TABLET | Freq: Two times a day (BID) | ORAL | Status: DC

## 2012-10-10 MED ORDER — RIVAROXABAN 20 MG PO TABS: 20.0000 mg | ORAL_TABLET | Freq: Every day | ORAL | Status: DC

## 2012-10-10 NOTE — Patient Instructions (Addendum)
You are out of rhythm (in atrial fibrillation)  We need to check labs today  We need to put you on some blood thinner to prevent a stroke   We are going to start Xarelto 20 mg a day - take with your supper. Use the samples that you have been given.  Stop your aspirin as of Thursday. Use Tylenol if needed for discomfort, headache, etc.  Increase your Lisinopril to two times a day. I have sent a new prescription to the drug store  We need to check an ultrasound of your heart  I need to get you a follow up visit with Dr. Elease Hashimoto

## 2012-10-10 NOTE — Progress Notes (Signed)
Echocardiogram performed.  

## 2012-10-10 NOTE — Telephone Encounter (Signed)
Walk-in sent in by Dr. Remus Blake office, according to pt. Pt's BP today manual left arm 144/100 right arm 150/94 pulse 88 beats/minute. Pt had his EKQ done at Dr. Bettey Mare office. Pt's office visit was moved from yesterday to today at 3:00 PM pt aware.

## 2012-10-10 NOTE — Progress Notes (Signed)
Steve Bailey Date of Birth: 1934-04-23 Medical Record #[id number]  History of Present Illness: Steve Bailey is seen back today for a work in visit. He is seen for Dr. Elease Hashimoto. He is a former patient of Dr. Ronnald Nian. He has known CAD with remote PCI and subsequent CABG. Last cath in 2010. Managed medically. Other issues include DM, HTN, HLD and past tobacco abuse.   He comes in today. He is seen at the request of Dr. Lucianne Muss. He is here alone. He went for his regular check up with Dr. Lucianne Muss earlier today. Noted to have an irregular pulse. EKG showed atrial fib. Rate is controlled. Duration is unknown. Steve Bailey says he feels fine. Remains active on his farm. No chest pain. No palpitations. Not dizzy or lightheaded. No syncope. Not short of breath. Has been diagnosed with prostate cancer. Sees the radiation oncologist later this week.   Current Outpatient Prescriptions on File Prior to Visit  Medication Sig Dispense Refill  . alfuzosin (UROXATRAL) 10 MG 24 hr tablet Take 10 mg by mouth daily.      Marland Kitchen atorvastatin (LIPITOR) 40 MG tablet Take 1 tablet (40 mg total) by mouth daily.  90 tablet  3  . Cinnamon 500 MG capsule Take 500 mg by mouth 2 (two) times daily.      Marland Kitchen ezetimibe (ZETIA) 10 MG tablet Take 1 tablet (10 mg total) by mouth daily.  90 tablet  1  . finasteride (PROSCAR) 5 MG tablet Take 5 mg by mouth daily.      . Insulin Isophane & Regular (HUMULIN 70/30 [place]) Inject 20 Units into the skin 2 (two) times daily. Take as Directed.      . metoprolol succinate (TOPROL-XL) 50 MG 24 hr tablet Take 1 tablet (50 mg total) by mouth daily. Take with or immediately following a meal.  90 tablet  0  . [DISCONTINUED] lisinopril (PRINIVIL,ZESTRIL) 20 MG tablet Take 20 mg by mouth daily.      . Rivaroxaban (XARELTO) 20 MG TABS Take 1 tablet (20 mg total) by mouth daily with supper.  30 tablet  6    No Known Allergies  Past Medical History  Diagnosis Date  . DM (diabetes mellitus)   . Hyperlipidemia   .  Tobacco abuse   . CAD (coronary artery disease)     remote CABG in 1997; prior PCI to the LAD i n1990; Nuclear study in February of 2010 with an EF of 60% with repeat cath showing distal LAD disease. Managed medically  . Atrial fibrillation   . Prostate cancer     Past Surgical History  Procedure Date  . Coronary artery bypass graft 11-1995    shows distal LAD disease, without recurrent symptoms of angina with questionable mild apical ischemia but with normal EF   . Angioplasty     of his left anterior descending previously in 1990.  . Cardiovascular stress test 12-2008    showed EF 60%  . Cataract extraction 2010  . Knee surgery     left  . Carpal tunnel release     right hand    History  Smoking status  . Former Smoker  Smokeless tobacco  . Former Neurosurgeon  . Quit date: 11/23/1978    History  Alcohol Use No    History reviewed. No pertinent family history.  Review of Systems: The review of systems is per the HPI.  All other systems were reviewed and are negative.  Physical Exam: BP 180/100  Pulse 72  Resp 20  Ht 6' (1.829 m)  Wt 202 lb (91.627 kg)  BMI 27.40 kg/m2 Patient is very pleasant and in no acute distress. Skin is warm and dry. Color is normal.  HEENT is unremarkable. Normocephalic/atraumatic. PERRL. Sclera are nonicteric. Neck is supple. No masses. No JVD. Lungs are clear. Cardiac exam shows an irregular rhythm. His rate is controlled. Abdomen is soft. Extremities are without edema. Gait and ROM are intact. No gross neurologic deficits noted.  LABORATORY DATA: EKG from Dr. Remus Blake office shows atrial fib/flutter with a controlled VR.   Lab Results  Component Value Date   GLUCOSE 167* 06/15/2012   CHOL 94 06/15/2012   TRIG 58.0 06/15/2012   HDL 41.60 06/15/2012   LDLCALC 41 06/15/2012   ALT 23 06/15/2012   AST 19 06/15/2012   NA 138 06/15/2012   K 3.7 06/15/2012   CL 104 06/15/2012   CREATININE 1.3 06/15/2012   BUN 21 06/15/2012   CO2 27 06/15/2012     Assessment / Plan:  1. New onset of atrial fib/flutter - duration unknown. Will check echo. Check labs. His rate is controlled. We have discussed anticoagulation. He is not interested in coumadin. He is agreeable to Xarelto. Samples are given of Xarelto 20 mg a day - to take at supper. Will see him back in about 3 weeks. Could consider cardioversion at that time.   2. HTN - blood pressure is up. I have increased his Linisopril to BID.  3. CAD - remote CABG - doing well. Will continue with medical management.   4. Prostate cancer - he is to see radiation oncology later this week.   Patient is agreeable to this plan and will call if any problems develop in the interim.

## 2012-10-11 ENCOUNTER — Other Ambulatory Visit: Payer: Self-pay | Admitting: *Deleted

## 2012-10-11 ENCOUNTER — Telehealth: Payer: Self-pay | Admitting: *Deleted

## 2012-10-11 ENCOUNTER — Ambulatory Visit: Payer: Medicare Other | Admitting: Nurse Practitioner

## 2012-10-11 DIAGNOSIS — E039 Hypothyroidism, unspecified: Secondary | ICD-10-CM

## 2012-10-11 DIAGNOSIS — I25810 Atherosclerosis of coronary artery bypass graft(s) without angina pectoris: Secondary | ICD-10-CM

## 2012-10-11 LAB — BASIC METABOLIC PANEL
BUN: 20 mg/dL (ref 6–23)
CO2: 29 mEq/L (ref 19–32)
Calcium: 8.8 mg/dL (ref 8.4–10.5)
Chloride: 104 mEq/L (ref 96–112)
Creatinine, Ser: 1.2 mg/dL (ref 0.4–1.5)
GFR: 63.1 mL/min (ref >60.00)
Glucose, Bld: 117 mg/dL — ABNORMAL HIGH (ref 70–99)
Potassium: 4.4 mEq/L (ref 3.5–5.1)
Sodium: 138 mEq/L (ref 135–145)

## 2012-10-11 LAB — CBC WITH DIFFERENTIAL/PLATELET
Basophils Absolute: 0 10*3/uL (ref 0.0–0.1)
Basophils Relative: 0.1 % (ref 0.0–3.0)
Eosinophils Absolute: 0.1 10*3/uL (ref 0.0–0.7)
Eosinophils Relative: 1.3 % (ref 0.0–5.0)
HCT: 39.8 % (ref 39.0–52.0)
Hemoglobin: 13.5 g/dL (ref 13.0–17.0)
Lymphocytes Relative: 28.3 % (ref 12.0–46.0)
Lymphs Abs: 1.3 10*3/uL (ref 0.7–4.0)
MCHC: 34 g/dL (ref 30.0–36.0)
MCV: 93.9 fl (ref 78.0–100.0)
Monocytes Absolute: 0.3 10*3/uL (ref 0.1–1.0)
Monocytes Relative: 7.7 % (ref 3.0–12.0)
Neutro Abs: 2.8 10*3/uL (ref 1.4–7.7)
Neutrophils Relative %: 62.6 % (ref 43.0–77.0)
Platelets: 160 10*3/uL (ref 150.0–400.0)
RBC: 4.24 Mil/uL (ref 4.22–5.81)
RDW: 14.7 % — ABNORMAL HIGH (ref 11.5–14.6)
WBC: 4.5 10*3/uL (ref 4.5–10.5)

## 2012-10-11 LAB — TSH: TSH: 9.25 u[IU]/mL — ABNORMAL HIGH (ref 0.35–5.50)

## 2012-10-11 MED ORDER — LEVOTHYROXINE SODIUM 25 MCG PO TABS: 25.0000 ug | ORAL_TABLET | Freq: Every day | ORAL | Status: DC

## 2012-10-11 NOTE — Telephone Encounter (Signed)
received note from [facility] endocrinology stating the pt was in Afib, chart reviewed, no hx of afib, asked them to send over the EKG in question, they will fax today.

## 2012-10-11 NOTE — Telephone Encounter (Signed)
Received faxed ekg and will have it reviewed by Dr Elease Hashimoto.

## 2012-10-11 NOTE — Telephone Encounter (Signed)
Gave results of echo done yesterday, pt has f/u with Dr Elease Hashimoto, Dr Elease Hashimoto will discuss need for cardioversion at that app, pt states he is" feeling well", denies increased sob, told him to call with any changes and pt agreed to plan.

## 2012-10-12 ENCOUNTER — Ambulatory Visit: Payer: Medicare Other

## 2012-10-12 ENCOUNTER — Encounter: Payer: Self-pay | Admitting: *Deleted

## 2012-10-12 ENCOUNTER — Ambulatory Visit: Payer: Medicare Other | Admitting: Radiation Oncology

## 2012-10-12 DIAGNOSIS — C61 Malignant neoplasm of prostate: Secondary | ICD-10-CM | POA: Insufficient documentation

## 2012-10-13 ENCOUNTER — Ambulatory Visit
Admission: RE | Admit: 2012-10-13 | Discharge: 2012-10-13 | Disposition: A | Discharge status: Home / Self Care | Payer: Medicare Other | Source: Ambulatory Visit | Attending: Radiation Oncology | Admitting: Radiation Oncology

## 2012-10-13 ENCOUNTER — Telehealth: Payer: Self-pay | Admitting: *Deleted

## 2012-10-13 ENCOUNTER — Encounter: Payer: Self-pay | Admitting: Radiation Oncology

## 2012-10-13 VITALS — BP 147/88 | HR 76 | Temp 97.7°F | Resp 20 | Ht 72.0 in | Wt 205.0 lb

## 2012-10-13 DIAGNOSIS — C61 Malignant neoplasm of prostate: Secondary | ICD-10-CM | POA: Insufficient documentation

## 2012-10-13 DIAGNOSIS — I4891 Unspecified atrial fibrillation: Secondary | ICD-10-CM | POA: Insufficient documentation

## 2012-10-13 HISTORY — DX: Essential (primary) hypertension: I10

## 2012-10-13 NOTE — Addendum Note (Signed)
Encounter addended by: Glennie Hawk, RN on: 10/13/2012  9:45 AM<BR>     Documentation filed: Charges VN

## 2012-10-13 NOTE — Progress Notes (Signed)
Radiation Oncology         [phone] ________________________________  Initial outpatient Consultation  Name: Steve Bailey MRN: [id number]  Date: 10/13/2012  DOB: Jan 30, 1934  YN:WGNFA,OZHY, MD  Milford Cage,*   REFERRING PHYSICIAN: Milford Cage,*  DIAGNOSIS: 78 yo man with stage T2a adenocarcinoma of the prostate with a Gleason's score of 4+3 and a PSA of 8.12  HISTORY OF PRESENT ILLNESS::Steve Bailey is a 78 y.o. gentleman. He was noted to have an elevated PSA with a history of negative prostate biopsies.  His PSA was 8.12 earlier this year andthe patient proceeded to transrectal ultrasound with 12 biopsies of the prostate on 10/11.  The prostate volume measured 103 cc and a nodular area was noted on imaging.  Out of 12 core biopsies,1 was positive.  The maximum Gleason score was 3+4, and this was seen in 15% of the left mid.  The patient reviewed the biopsy results with his urologist and he has kindly been referred today for discussion of potential radiation treatment options.   PREVIOUS RADIATION THERAPY: No  PAST MEDICAL HISTORY:  has a past medical history of DM (diabetes mellitus); Hyperlipidemia; Tobacco abuse; CAD (coronary artery disease); Prostate cancer (09/02/12); Atrial fibrillation (dx 10/10/12); Hypertension (dx 10/10/12); and Thyroid disease (dx 10/10/12).    PAST SURGICAL HISTORY: Past Surgical History  Procedure Date  . Coronary artery bypass graft 11-1995    shows distal LAD disease, without recurrent symptoms of angina with questionable mild apical ischemia but with normal EF   . Angioplasty     of his left anterior descending previously in 1990.  . Cardiovascular stress test 12-2008    showed EF 60%  . Cataract extraction 2010    bilateral  . Knee surgery     left, arthroscopic  . Carpal tunnel release     right hand  . Esophagogastroduodenoscopy (egd) with esophageal dilation 05/2012    FAMILY HISTORY: family history  includes Cancer in his brother and sisters; Diabetes in his brother; Heart attack in his mother and sisters; Heart disease in his brothers and sisters; and Stroke in his brothers and father.  SOCIAL HISTORY:  reports that he quit smoking about 33 years ago. He quit smokeless tobacco use about 33 years ago. His smokeless tobacco use included Chew. He reports that he does not drink alcohol or use illicit drugs.  ALLERGIES: Review of patient's allergies indicates no known allergies.  MEDICATIONS:  Current Outpatient Prescriptions  Medication Sig Dispense Refill  . alfuzosin (UROXATRAL) 10 MG 24 hr tablet Take 10 mg by mouth daily.      Marland Kitchen atorvastatin (LIPITOR) 40 MG tablet Take 1 tablet (40 mg total) by mouth daily.  90 tablet  3  . Cinnamon 500 MG capsule Take 500 mg by mouth 2 (two) times daily.      Marland Kitchen ezetimibe (ZETIA) 10 MG tablet Take 1 tablet (10 mg total) by mouth daily.  90 tablet  1  . finasteride (PROSCAR) 5 MG tablet Take 5 mg by mouth daily.      . Insulin Isophane & Regular (HUMULIN 70/30 [place]) Inject 20 Units into the skin 2 (two) times daily. Take as Directed.      Marland Kitchen levothyroxine (SYNTHROID, LEVOTHROID) 25 MCG tablet Take 1 tablet (25 mcg total) by mouth daily.  30 tablet  11  . lisinopril (PRINIVIL,ZESTRIL) 20 MG tablet Take 1 tablet (20 mg total) by mouth 2 (two) times daily.  60 tablet  6  .  metoprolol succinate (TOPROL-XL) 50 MG 24 hr tablet Take 1 tablet (50 mg total) by mouth daily. Take with or immediately following a meal.  90 tablet  0  . Rivaroxaban (XARELTO) 20 MG TABS Take 1 tablet (20 mg total) by mouth daily with supper.  30 tablet  6    REVIEW OF SYSTEMS:  A 15 point review of systems is documented in the electronic medical record. This was obtained by the nursing staff. However, I reviewed this with the patient to discuss relevant findings and make appropriate changes.  A comprehensive review of systems was negative.   PHYSICAL EXAM:  height is 6' (1.829 m) and  weight is 205 lb (92.987 kg). His oral temperature is 97.7 F (36.5 C). His blood pressure is 147/88 and his pulse is 76. His respiration is 20.   Detailed exam deferred  LABORATORY DATA:  Lab Results  Component Value Date   WBC 4.5 10/10/2012   HGB 13.5 10/10/2012   HCT 39.8 10/10/2012   MCV 93.9 10/10/2012   PLT 160.0 10/10/2012   Lab Results  Component Value Date   NA 138 10/10/2012   K 4.4 10/10/2012   CL 104 10/10/2012   CO2 29 10/10/2012   Lab Results  Component Value Date   ALT 23 06/15/2012   AST 19 06/15/2012   ALKPHOS 72 06/15/2012   BILITOT 1.6* 06/15/2012     RADIOGRAPHY: Dg Eye Foreign Body  10/03/2012  *RADIOLOGY REPORT*  Clinical Data: Metal working/exposure; clearance prior to MRI  ORBITS FOR FOREIGN BODY - 2 VIEW  Comparison:  None.  Findings:  There is no evidence of metallic foreign body within the orbits.  No significant bone abnormality identified.  IMPRESSION: No evidence of metallic foreign body within the orbits.   Original Report Authenticated By: Davonna Belling, M.D.    Mr Prostate W / Wo Cm  10/03/2012  *RADIOLOGY REPORT*  Clinical Data:  Newly diagnosed prostate cancer  MR PROSTATE WITHOUT AND WITH CONTRAST  Technique:  Multiplanar multisequence MRI images were obtained of the pelvis, centered about the prostate, using both external and endorectal coils.  Pre and post contrast images were obtained.  Contrast:  19mL MULTIHANCE GADOBENATE DIMEGLUMINE 529 MG/ML IV SOLN  Comparison:  None  FINDINGS:  Prostate: Marked enlargement of the central gland of the prostate, compatible with benign prostatic hyperplasia.  Multiple central gland nodules, some of which are hemorrhagic, likely related to prior biopsy.  Possible focal macroscopic tumor along the lateral left mid gland (series 5/image 19).  No restricted diffusion.  No definite early arterial enhancement.  Transcapsular spread:  Absent.  Seminal vesicle involvement:  Absent.  Hemorrhage within the left seminal  vesicle.  Neurovascular bundle involvement: Absent.  Nodularity on the left (series 5/image 20), of uncertain etiology, but benign.  Pelvic adenopathy:  Absent.  Bone metastasis:  Absent.  Other findings:Thick-walled bladder, likely reflecting chronic outlet obstruction.  IMPRESSION: Possible focal macroscopic tumor along the lateral left mid gland.  No evidence of extracapsular spread, seminal vesicle involvement, or neurovascular bundle involvement.  No pelvic lymphadenopathy or osseous metastases.  Marked enlargement of the central gland, compatible with BPH, with suspected chronic bladder outlet obstruction.   Original Report Authenticated By: Charline Bills, M.D.       IMPRESSION: Mr. Wilms is a very nice gentleman with with stage T2a adenocarcinoma of the prostate with a Gleason's score of 4+3 and a PSA of 8.12.  He falls into an intermediate risk grouping because of  his Gleason's score and is eligible for a variety of approaches including IMRT.  PLAN:Today I reviewed the findings and workup thus far.  We discussed the natural history of prostate cancer.  We reviewed the the implications of T-stage, Gleason's Score, and PSA on decision-making and outcomes in prostate cancer.  We discussed radiation treatment in the management of prostate cancer with regard to the logistics and delivery of external beam radiation treatment as well as the logistics and delivery of prostate brachytherapy.  We compared and contrasted each of these approaches and also compared these against prostatectomy.  The patient expressed interest in external beam radiotherapy.  I filled out a patient counseling form for him with relevant treatment diagrams and we retained a copy for our records.   The patient would like to proceed with prostate IMRT.  I will share my findings with Dr. Margarita Grizzle and move forward with scheduling placement of three gold fiducial markers into the prostate to proceed with IMRT in the near future.     I  enjoyed meeting with him today, and will look forward to participating in the care of this very nice gentleman.  I spent 60 minutes minutes face to face with the patient and more than 50% of that time was spent in counseling and/or coordination of care.   ------------------------------------------------  Artist Pais. Kathrynn Running, M.D.

## 2012-10-13 NOTE — Telephone Encounter (Signed)
Called patient to inform of gold seed placement on 11-08-12 - arrival time - 3:30 pm, and his sim on 11-11-12 arrival time - 3:00 pm, spoke with patient 's wife and they are aware of these appts.

## 2012-10-13 NOTE — Progress Notes (Signed)
Please see the Nurse Progress Note in the MD Initial Consult Encounter for this patient. 

## 2012-10-13 NOTE — Progress Notes (Addendum)
Retired from [employer]. Married, 1 daughter. Had 7 sisters, 4 brothers. Interested in external beam therapy. IPSS score today 6.

## 2012-10-18 ENCOUNTER — Telehealth: Payer: Self-pay | Admitting: Cardiovascular Disease

## 2012-10-18 DIAGNOSIS — I2510 Atherosclerotic heart disease of native coronary artery without angina pectoris: Secondary | ICD-10-CM

## 2012-10-18 NOTE — Telephone Encounter (Signed)
Please advise if pt can hold  Xarelto for 7 days.

## 2012-10-18 NOTE — Telephone Encounter (Signed)
Pt having beads implanted by his urologist dr Margarita Grizzle on 12-17, and needs to go off blood thinner seven days prior , pls fax to [phone] dr Margarita Grizzle

## 2012-10-19 NOTE — Telephone Encounter (Signed)
He is on xarelto.  It only needs to be held 2 days.  He just started Xarelto for AF.  Will need delay his OV to consider cardioversion

## 2012-10-19 NOTE — Telephone Encounter (Signed)
WILL FAX TO DR WOODRUF/ UROLOGY TO HOLD XARELTO FOR 2 DAYS PRIOR TO PROCEDURE. Carmon Ginsberg [phone]

## 2012-10-24 NOTE — Telephone Encounter (Signed)
Pt aware we have cancelled his appointment to discuss afib/ cardioversion and he is to call back to reschedule after his urology app. Pt verbalized understanding.

## 2012-10-26 ENCOUNTER — Ambulatory Visit: Payer: Medicare Other

## 2012-10-26 ENCOUNTER — Ambulatory Visit: Payer: Medicare Other | Admitting: Radiation Oncology

## 2012-11-01 ENCOUNTER — Ambulatory Visit: Payer: Medicare Other | Admitting: Cardiovascular Disease

## 2012-11-08 ENCOUNTER — Telehealth: Payer: Self-pay | Admitting: Cardiovascular Disease

## 2012-11-08 DIAGNOSIS — C61 Malignant neoplasm of prostate: Secondary | ICD-10-CM | POA: Diagnosis not present

## 2012-11-08 DIAGNOSIS — IMO0002 Reserved for concepts with insufficient information to code with codable children: Secondary | ICD-10-CM | POA: Diagnosis not present

## 2012-11-08 NOTE — Telephone Encounter (Signed)
Pt has script at pharmacy, he is off currently, he will have seeds planted for prostate cancer tomorrow and has many treatments to follow, expressed importance of xarelto after procedure. Pt agreed to plan.

## 2012-11-08 NOTE — Telephone Encounter (Signed)
New Problem:    Patient's wife called in because he was given samples of Rivaroxaban (XARELTO) 20 MG TABS and  Now that his samples are finished she would like to know how to proceed. Please call back.

## 2012-11-11 ENCOUNTER — Telehealth: Payer: Self-pay | Admitting: Radiation Oncology

## 2012-11-11 ENCOUNTER — Encounter: Payer: Self-pay | Admitting: Radiation Oncology

## 2012-11-11 ENCOUNTER — Ambulatory Visit
Admission: RE | Admit: 2012-11-11 | Discharge: 2012-11-11 | Disposition: A | Discharge status: Home / Self Care | Payer: Medicare Other | Source: Ambulatory Visit | Attending: Radiation Oncology | Admitting: Radiation Oncology

## 2012-11-11 DIAGNOSIS — R351 Nocturia: Secondary | ICD-10-CM | POA: Diagnosis not present

## 2012-11-11 DIAGNOSIS — C61 Malignant neoplasm of prostate: Secondary | ICD-10-CM | POA: Insufficient documentation

## 2012-11-11 DIAGNOSIS — Z510 Encounter for antineoplastic radiation therapy: Secondary | ICD-10-CM | POA: Insufficient documentation

## 2012-11-11 DIAGNOSIS — R300 Dysuria: Secondary | ICD-10-CM | POA: Insufficient documentation

## 2012-11-11 DIAGNOSIS — Z794 Long term (current) use of insulin: Secondary | ICD-10-CM | POA: Diagnosis not present

## 2012-11-11 DIAGNOSIS — R110 Nausea: Secondary | ICD-10-CM | POA: Diagnosis not present

## 2012-11-11 DIAGNOSIS — Z79899 Other long term (current) drug therapy: Secondary | ICD-10-CM | POA: Diagnosis not present

## 2012-11-11 NOTE — Progress Notes (Signed)
  Radiation Oncology         [phone] ________________________________  Name: Steve Bailey MRN: [id number]  Date: 11/11/2012  DOB: [birth year]  SIMULATION AND TREATMENT PLANNING NOTE  DIAGNOSIS:  78 yo man with stage T2a adenocarcinoma of the prostate with a Gleason's score of 4+3 and a PSA of 8.12  NARRATIVE:  The patient was brought to the CT Simulation planning suite.  Identity was confirmed.  All relevant records and images related to the planned course of therapy were reviewed.  The patient freely provided informed written consent to proceed with treatment after reviewing the details related to the planned course of therapy. The consent form was witnessed and verified by the simulation staff.  Then, the patient was set-up in a stable reproducible supine position for radiation therapy.  A vacuum lock pillow device was custom fabricated to position his legs in a reproducible immobilized position.  Then, I performed a urethrogram under sterile conditions to identify the prostatic apex.  CT images were obtained.  Surface markings were placed.  The CT images were loaded into the planning software.  Then the prostate target and avoidance structures including the rectum, bladder, bowel and hips were contoured.  Treatment planning then occurred.  The radiation prescription was entered and confirmed.  A total of one complex treatment device was fabricated. I have requested : Intensity Modulated Radiotherapy (IMRT) is medically necessary for this case for the following reason:  Rectal sparing.Marland Kitchen  PLAN:  The patient will receive 78 Gy in 40 fractions.  ________________________________  [name], M.D.

## 2012-11-11 NOTE — Telephone Encounter (Signed)
Met w patient to discuss RO billing. Pt had no financial concerns today. ° °Dx: 185 Prostate ° °Attending Rad: MM ° °Rad Tx: IMRT x40 °

## 2012-11-14 ENCOUNTER — Telehealth: Payer: Self-pay | Admitting: Cardiovascular Disease

## 2012-11-14 MED ORDER — RIVAROXABAN 20 MG PO TABS: 20.0000 mg | ORAL_TABLET | Freq: Every day | ORAL | Status: DC

## 2012-11-14 NOTE — Telephone Encounter (Signed)
Pt daughter wants to is pt gonna stay on xarelto is so they need a Rx called in

## 2012-11-14 NOTE — Telephone Encounter (Signed)
Script sent, msg left that he needs to remain on med till he can be cardioverted.

## 2012-11-15 ENCOUNTER — Other Ambulatory Visit: Payer: Self-pay | Admitting: Nurse Practitioner

## 2012-11-15 MED ORDER — RIVAROXABAN 20 MG PO TABS: 20.0000 mg | ORAL_TABLET | Freq: Every day | ORAL | Status: DC

## 2012-11-17 ENCOUNTER — Encounter: Payer: Self-pay | Admitting: Cardiology

## 2012-11-21 DIAGNOSIS — R300 Dysuria: Secondary | ICD-10-CM | POA: Diagnosis not present

## 2012-11-21 DIAGNOSIS — Z510 Encounter for antineoplastic radiation therapy: Secondary | ICD-10-CM | POA: Diagnosis not present

## 2012-11-21 DIAGNOSIS — R110 Nausea: Secondary | ICD-10-CM | POA: Diagnosis not present

## 2012-11-21 DIAGNOSIS — Z79899 Other long term (current) drug therapy: Secondary | ICD-10-CM | POA: Diagnosis not present

## 2012-11-21 DIAGNOSIS — R351 Nocturia: Secondary | ICD-10-CM | POA: Diagnosis not present

## 2012-11-21 DIAGNOSIS — C61 Malignant neoplasm of prostate: Secondary | ICD-10-CM | POA: Diagnosis not present

## 2012-11-24 ENCOUNTER — Ambulatory Visit
Admission: RE | Admit: 2012-11-24 | Discharge: 2012-11-24 | Disposition: A | Discharge status: Home / Self Care | Payer: Medicare Other | Source: Ambulatory Visit | Attending: Radiation Oncology | Admitting: Radiation Oncology

## 2012-11-24 DIAGNOSIS — R351 Nocturia: Secondary | ICD-10-CM | POA: Diagnosis not present

## 2012-11-24 DIAGNOSIS — R300 Dysuria: Secondary | ICD-10-CM | POA: Diagnosis not present

## 2012-11-24 DIAGNOSIS — Z510 Encounter for antineoplastic radiation therapy: Secondary | ICD-10-CM | POA: Diagnosis not present

## 2012-11-24 DIAGNOSIS — C61 Malignant neoplasm of prostate: Secondary | ICD-10-CM

## 2012-11-24 DIAGNOSIS — Z794 Long term (current) use of insulin: Secondary | ICD-10-CM | POA: Diagnosis not present

## 2012-11-24 DIAGNOSIS — R110 Nausea: Secondary | ICD-10-CM | POA: Diagnosis not present

## 2012-11-24 DIAGNOSIS — Z79899 Other long term (current) drug therapy: Secondary | ICD-10-CM | POA: Diagnosis not present

## 2012-11-25 ENCOUNTER — Ambulatory Visit
Admission: RE | Admit: 2012-11-25 | Discharge: 2012-11-25 | Disposition: A | Discharge status: Home / Self Care | Payer: Medicare Other | Source: Ambulatory Visit | Attending: Radiation Oncology | Admitting: Radiation Oncology

## 2012-11-25 ENCOUNTER — Encounter: Payer: Self-pay | Admitting: Radiation Oncology

## 2012-11-25 VITALS — BP 137/76 | HR 72 | Temp 97.8°F | Resp 20 | Wt 206.2 lb

## 2012-11-25 DIAGNOSIS — C61 Malignant neoplasm of prostate: Secondary | ICD-10-CM

## 2012-11-25 DIAGNOSIS — R300 Dysuria: Secondary | ICD-10-CM | POA: Diagnosis not present

## 2012-11-25 DIAGNOSIS — Z510 Encounter for antineoplastic radiation therapy: Secondary | ICD-10-CM | POA: Diagnosis not present

## 2012-11-25 DIAGNOSIS — R351 Nocturia: Secondary | ICD-10-CM | POA: Diagnosis not present

## 2012-11-25 DIAGNOSIS — R110 Nausea: Secondary | ICD-10-CM | POA: Diagnosis not present

## 2012-11-25 DIAGNOSIS — Z79899 Other long term (current) drug therapy: Secondary | ICD-10-CM | POA: Diagnosis not present

## 2012-11-25 NOTE — Progress Notes (Signed)
  Radiation Oncology         [phone] ________________________________  Name: Steve Bailey MRN: [id number]  Date: 11/25/2012  DOB: November 13, 1934  Weekly Radiation Therapy Management  Current Dose: 3.9 Gy     Planned Dose:  78 Gy  Narrative . . . . . . . . The patient presents for routine under treatment assessment.                                            Patient here for weekly rad txs, 2/40, no c/o pqin,does have slight dysuria started today stated, slow stream, only gets up 1` x night to void, regular bowel movements, patient education given, radiation therapy and you book given along with my business card,can call for any questions/concerns, discussed side effects/symptoms to report, sees MD every Friday and prn            The patient is without complaint.                                 Set-up films were reviewed.                                 The chart was checked. Physical Findings. . .  weight is 206 lb 3.2 oz (93.532 kg). His oral temperature is 97.8 F (36.6 C). His blood pressure is 137/76 and his pulse is 72. His respiration is 20. . Weight essentially stable.  No significant changes. Impression . . . . . . . The patient is  tolerating radiation. Plan . . . . . . . . . . . . Continue treatment as planned.  ________________________________  Artist Pais. Kathrynn Running, M.D.

## 2012-11-25 NOTE — Progress Notes (Signed)
Patient here for weekly rad txs, 2/40,  no c/o pqin,does have slight dysuria started today stated, slow stream, only gets up 1` x night to void, regular bowel movements, patient education given, radiation therapy and you book given along with my business card,can call for any questions/concerns, discussed side effects/symptoms to report, sees MD every Friday and prn 2:15 PM'

## 2012-11-28 ENCOUNTER — Ambulatory Visit
Admission: RE | Admit: 2012-11-28 | Discharge: 2012-11-28 | Disposition: A | Discharge status: Home / Self Care | Payer: Medicare Other | Source: Ambulatory Visit | Attending: Radiation Oncology | Admitting: Radiation Oncology

## 2012-11-28 ENCOUNTER — Other Ambulatory Visit (INDEPENDENT_AMBULATORY_CARE_PROVIDER_SITE_OTHER): Payer: Medicare Other

## 2012-11-28 DIAGNOSIS — C61 Malignant neoplasm of prostate: Secondary | ICD-10-CM | POA: Diagnosis not present

## 2012-11-28 DIAGNOSIS — R300 Dysuria: Secondary | ICD-10-CM | POA: Diagnosis not present

## 2012-11-28 DIAGNOSIS — E039 Hypothyroidism, unspecified: Secondary | ICD-10-CM

## 2012-11-28 DIAGNOSIS — R110 Nausea: Secondary | ICD-10-CM | POA: Diagnosis not present

## 2012-11-28 DIAGNOSIS — I25810 Atherosclerosis of coronary artery bypass graft(s) without angina pectoris: Secondary | ICD-10-CM

## 2012-11-28 DIAGNOSIS — R351 Nocturia: Secondary | ICD-10-CM | POA: Diagnosis not present

## 2012-11-28 DIAGNOSIS — Z79899 Other long term (current) drug therapy: Secondary | ICD-10-CM | POA: Diagnosis not present

## 2012-11-28 DIAGNOSIS — Z510 Encounter for antineoplastic radiation therapy: Secondary | ICD-10-CM | POA: Diagnosis not present

## 2012-11-28 LAB — TSH: TSH: 4.38 u[IU]/mL (ref 0.35–5.50)

## 2012-11-29 ENCOUNTER — Ambulatory Visit
Admission: RE | Admit: 2012-11-29 | Discharge: 2012-11-29 | Disposition: A | Discharge status: Home / Self Care | Payer: Medicare Other | Source: Ambulatory Visit | Attending: Radiation Oncology | Admitting: Radiation Oncology

## 2012-11-29 DIAGNOSIS — R110 Nausea: Secondary | ICD-10-CM | POA: Diagnosis not present

## 2012-11-29 DIAGNOSIS — R351 Nocturia: Secondary | ICD-10-CM | POA: Diagnosis not present

## 2012-11-29 DIAGNOSIS — R300 Dysuria: Secondary | ICD-10-CM | POA: Diagnosis not present

## 2012-11-29 DIAGNOSIS — Z510 Encounter for antineoplastic radiation therapy: Secondary | ICD-10-CM | POA: Diagnosis not present

## 2012-11-29 DIAGNOSIS — Z79899 Other long term (current) drug therapy: Secondary | ICD-10-CM | POA: Diagnosis not present

## 2012-11-29 DIAGNOSIS — C61 Malignant neoplasm of prostate: Secondary | ICD-10-CM | POA: Diagnosis not present

## 2012-11-30 ENCOUNTER — Ambulatory Visit
Admission: RE | Admit: 2012-11-30 | Discharge: 2012-11-30 | Disposition: A | Discharge status: Home / Self Care | Payer: Medicare Other | Source: Ambulatory Visit | Attending: Radiation Oncology | Admitting: Radiation Oncology

## 2012-11-30 DIAGNOSIS — R300 Dysuria: Secondary | ICD-10-CM | POA: Diagnosis not present

## 2012-11-30 DIAGNOSIS — R351 Nocturia: Secondary | ICD-10-CM | POA: Diagnosis not present

## 2012-11-30 DIAGNOSIS — Z510 Encounter for antineoplastic radiation therapy: Secondary | ICD-10-CM | POA: Diagnosis not present

## 2012-11-30 DIAGNOSIS — C61 Malignant neoplasm of prostate: Secondary | ICD-10-CM | POA: Diagnosis not present

## 2012-11-30 DIAGNOSIS — Z79899 Other long term (current) drug therapy: Secondary | ICD-10-CM | POA: Diagnosis not present

## 2012-11-30 DIAGNOSIS — R110 Nausea: Secondary | ICD-10-CM | POA: Diagnosis not present

## 2012-12-01 ENCOUNTER — Ambulatory Visit
Admission: RE | Admit: 2012-12-01 | Discharge: 2012-12-01 | Disposition: A | Discharge status: Home / Self Care | Payer: Medicare Other | Source: Ambulatory Visit | Attending: Radiation Oncology | Admitting: Radiation Oncology

## 2012-12-01 DIAGNOSIS — Z510 Encounter for antineoplastic radiation therapy: Secondary | ICD-10-CM | POA: Diagnosis not present

## 2012-12-01 DIAGNOSIS — R300 Dysuria: Secondary | ICD-10-CM | POA: Diagnosis not present

## 2012-12-01 DIAGNOSIS — C61 Malignant neoplasm of prostate: Secondary | ICD-10-CM | POA: Diagnosis not present

## 2012-12-01 DIAGNOSIS — Z79899 Other long term (current) drug therapy: Secondary | ICD-10-CM | POA: Diagnosis not present

## 2012-12-01 DIAGNOSIS — R351 Nocturia: Secondary | ICD-10-CM | POA: Diagnosis not present

## 2012-12-01 DIAGNOSIS — R110 Nausea: Secondary | ICD-10-CM | POA: Diagnosis not present

## 2012-12-02 ENCOUNTER — Encounter: Payer: Self-pay | Admitting: Radiation Oncology

## 2012-12-02 ENCOUNTER — Ambulatory Visit
Admission: RE | Admit: 2012-12-02 | Discharge: 2012-12-02 | Disposition: A | Discharge status: Home / Self Care | Payer: Medicare Other | Source: Ambulatory Visit | Attending: Radiation Oncology | Admitting: Radiation Oncology

## 2012-12-02 VITALS — BP 161/93 | HR 73 | Temp 98.4°F | Resp 20 | Wt 204.0 lb

## 2012-12-02 DIAGNOSIS — Z79899 Other long term (current) drug therapy: Secondary | ICD-10-CM | POA: Diagnosis not present

## 2012-12-02 DIAGNOSIS — Z510 Encounter for antineoplastic radiation therapy: Secondary | ICD-10-CM | POA: Diagnosis not present

## 2012-12-02 DIAGNOSIS — R300 Dysuria: Secondary | ICD-10-CM | POA: Diagnosis not present

## 2012-12-02 DIAGNOSIS — C61 Malignant neoplasm of prostate: Secondary | ICD-10-CM | POA: Diagnosis not present

## 2012-12-02 DIAGNOSIS — R110 Nausea: Secondary | ICD-10-CM | POA: Diagnosis not present

## 2012-12-02 DIAGNOSIS — R351 Nocturia: Secondary | ICD-10-CM | POA: Diagnosis not present

## 2012-12-02 NOTE — Progress Notes (Signed)
Pt c/o "burning w/urination which began 2 days after I started radiation".  Pt on Uroxatral. He reports nocturia x 2-3 and urinary urgency. He denies other urianry or bowel issues, fatigue, loss of appetite.

## 2012-12-02 NOTE — Progress Notes (Signed)
  Radiation Oncology         [phone] ________________________________  Name: Steve Bailey MRN: [id number]  Date: 12/02/2012  DOB: 03/09/1934  Weekly Radiation Therapy Management  Current Dose: 13.65 Gy     Planned Dose:  78 Gy  Narrative . . . . . . . . The patient presents for routine under treatment assessment.                                           The patient is without complaint.                                 Set-up films were reviewed.                                 The chart was checked. Physical Findings. . .  weight is 204 lb (92.534 kg). His oral temperature is 98.4 F (36.9 C). His blood pressure is 161/93 and his pulse is 73. His respiration is 20. . Weight essentially stable.  No significant changes. Impression . . . . . . . The patient is  tolerating radiation. Plan . . . . . . . . . . . . Continue treatment as planned.  ________________________________  Artist Pais. Kathrynn Running, M.D.

## 2012-12-03 ENCOUNTER — Encounter (HOSPITAL_COMMUNITY): Payer: Self-pay | Admitting: *Deleted

## 2012-12-03 ENCOUNTER — Emergency Department (HOSPITAL_COMMUNITY)
Admission: EM | Admit: 2012-12-03 | Discharge: 2012-12-04 | Disposition: A | Discharge status: Home / Self Care | Payer: Medicare Other | Attending: Emergency Medicine | Admitting: Emergency Medicine

## 2012-12-03 DIAGNOSIS — I10 Essential (primary) hypertension: Secondary | ICD-10-CM | POA: Insufficient documentation

## 2012-12-03 DIAGNOSIS — Z79899 Other long term (current) drug therapy: Secondary | ICD-10-CM | POA: Diagnosis not present

## 2012-12-03 DIAGNOSIS — I2510 Atherosclerotic heart disease of native coronary artery without angina pectoris: Secondary | ICD-10-CM | POA: Diagnosis not present

## 2012-12-03 DIAGNOSIS — E119 Type 2 diabetes mellitus without complications: Secondary | ICD-10-CM | POA: Insufficient documentation

## 2012-12-03 DIAGNOSIS — I499 Cardiac arrhythmia, unspecified: Secondary | ICD-10-CM | POA: Diagnosis not present

## 2012-12-03 DIAGNOSIS — Z794 Long term (current) use of insulin: Secondary | ICD-10-CM | POA: Insufficient documentation

## 2012-12-03 DIAGNOSIS — E785 Hyperlipidemia, unspecified: Secondary | ICD-10-CM | POA: Diagnosis not present

## 2012-12-03 DIAGNOSIS — C61 Malignant neoplasm of prostate: Secondary | ICD-10-CM | POA: Diagnosis not present

## 2012-12-03 DIAGNOSIS — R300 Dysuria: Secondary | ICD-10-CM | POA: Diagnosis not present

## 2012-12-03 DIAGNOSIS — R3915 Urgency of urination: Secondary | ICD-10-CM | POA: Diagnosis not present

## 2012-12-03 DIAGNOSIS — N390 Urinary tract infection, site not specified: Secondary | ICD-10-CM | POA: Diagnosis not present

## 2012-12-03 DIAGNOSIS — E0789 Other specified disorders of thyroid: Secondary | ICD-10-CM | POA: Diagnosis not present

## 2012-12-03 DIAGNOSIS — Z87891 Personal history of nicotine dependence: Secondary | ICD-10-CM | POA: Diagnosis not present

## 2012-12-03 DIAGNOSIS — Z951 Presence of aortocoronary bypass graft: Secondary | ICD-10-CM | POA: Insufficient documentation

## 2012-12-03 DIAGNOSIS — Z8679 Personal history of other diseases of the circulatory system: Secondary | ICD-10-CM | POA: Insufficient documentation

## 2012-12-03 LAB — URINE MICROSCOPIC-ADD ON

## 2012-12-03 LAB — URINALYSIS, ROUTINE W REFLEX MICROSCOPIC
Glucose, UA: NEGATIVE mg/dL
Leukocytes, UA: NEGATIVE
Nitrite: NEGATIVE
Protein, ur: 100 mg/dL — AB
Specific Gravity, Urine: 1.025 (ref 1.005–1.030)
Urobilinogen, UA: 0.2 mg/dL (ref 0.0–1.0)
pH: 6.5 (ref 5.0–8.0)

## 2012-12-03 MED ORDER — DEXTROSE 5 % IV SOLN
1.0000 g | Freq: Once | INTRAVENOUS | Status: AC
  Administered 2012-12-04: 1 g via INTRAVENOUS
  Filled 2012-12-03: qty 10

## 2012-12-03 MED ORDER — OXYCODONE-ACETAMINOPHEN 5-325 MG PO TABS
2.0000 | ORAL_TABLET | Freq: Once | ORAL | Status: AC
  Administered 2012-12-04: 2 via ORAL
  Filled 2012-12-03: qty 2

## 2012-12-03 NOTE — ED Provider Notes (Signed)
History     CSN: [id number]  Arrival date & time 12/03/12  2248   First MD Initiated Contact with Patient 12/03/12 2340      Chief Complaint  Patient presents with  . Urinary Retention    (Consider location/radiation/quality/duration/timing/severity/associated sxs/prior treatment) HPI Patient with burning with urination and only able to void small amounts at a time all day.  Feels urge to void.  Patient undergoing radiation for prostates cancer with last treatment yesterday.  Patient without previous similar symptoms.  No nausea, vomiting, fever or chills.  Pain is midline and burning.  He is taking xarelto for a fib.  Past Medical History  Diagnosis Date  . DM (diabetes mellitus)   . Hyperlipidemia   . Tobacco abuse   . CAD (coronary artery disease)     remote CABG in 1997; prior PCI to the LAD i n1990; Nuclear study in February of 2010 with an EF of 60% with repeat cath showing distal LAD disease. Managed medically  . 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 Atrial fibrillation dx 10/10/12    started on med 10/10/12  . Hypertension dx 10/10/12  . Thyroid disease dx 10/10/12    started on Synthroid    Past Surgical History  Procedure Date  . Coronary artery bypass graft 11-1995    shows distal LAD disease, without recurrent symptoms of angina with questionable mild apical ischemia but with normal EF   . Angioplasty     of his left anterior descending previously in 1990.  . Cardiovascular stress test 12-2008    showed EF 60%  . Cataract extraction 2010    bilateral  . Knee surgery     left, arthroscopic  . Carpal tunnel release     right hand  . Esophagogastroduodenoscopy (egd) with esophageal dilation 05/2012    Family History  Problem Relation Age of Onset  . Stroke Father   . Heart attack Mother   . Cancer Sister     lung  . Cancer Brother     prostate  . Diabetes Brother   . Stroke Brother   . Stroke Brother   . Heart disease Brother   .  Heart disease Brother   . Heart attack Sister   . Heart disease Sister   . Heart attack Sister   . Cancer Sister     breast  . Heart disease Sister   . Heart attack Sister   . Heart disease Sister   . Heart disease Sister     History  Substance Use Topics  . Smoking status: Former Smoker    Quit date: 10/14/1979  . Smokeless tobacco: Former Neurosurgeon    Types: Chew    Quit date: 11/23/1978  . Alcohol Use: No      Review of Systems  All other systems reviewed and are negative.    Allergies  Review of patient's allergies indicates no known allergies.  Home Medications   Current Outpatient Rx  Name  Route  Sig  Dispense  Refill  . ALFUZOSIN HCL ER 10 MG PO TB24   Oral   Take 10 mg by mouth daily.         . ATORVASTATIN CALCIUM 40 MG PO TABS   Oral   Take 1 tablet (40 mg total) by mouth daily.   90 tablet   3   . CINNAMON 500 MG PO CAPS   Oral   Take 500 mg by mouth 2 (  two) times daily.         Marland Kitchen EZETIMIBE 10 MG PO TABS   Oral   Take 1 tablet (10 mg total) by mouth daily.   90 tablet   1   . FINASTERIDE 5 MG PO TABS   Oral   Take 5 mg by mouth daily.         Marland Kitchen HUMULIN 70/30 [place]   Subcutaneous   Inject 20 Units into the skin 2 (two) times daily. Take as Directed.         Marland Kitchen LEVOTHYROXINE SODIUM 25 MCG PO TABS   Oral   Take 1 tablet (25 mcg total) by mouth daily.   30 tablet   11   . LISINOPRIL 20 MG PO TABS   Oral   Take 1 tablet (20 mg total) by mouth 2 (two) times daily.   60 tablet   6   . METOPROLOL SUCCINATE ER 50 MG PO TB24   Oral   Take 1 tablet (50 mg total) by mouth daily. Take with or immediately following a meal.   90 tablet   0   . RIVAROXABAN 20 MG PO TABS   Oral   Take 1 tablet (20 mg total) by mouth daily with supper.   30 tablet   6     BP 190/108  Pulse 56  Temp 98.2 F (36.8 C) (Oral)  Resp 20  Ht 6' (1.829 m)  Wt 200 lb (90.719 kg)  BMI 27.12 kg/m2  SpO2 98%  Physical Exam  Nursing note and vitals  reviewed. Constitutional: He is oriented to person, place, and time. He appears well-developed and well-nourished.  HENT:  Head: Normocephalic and atraumatic.  Eyes: Pupils are equal, round, and reactive to light.  Neck: Normal range of motion.  Cardiovascular: An irregularly irregular rhythm present. Bradycardia present.   Pulmonary/Chest: Effort normal and breath sounds normal.  Abdominal: Soft. There is tenderness.       Suprapubic tenderness  Genitourinary: Penis normal.  Musculoskeletal: Normal range of motion.  Neurological: He is alert and oriented to person, place, and time.  Skin: Skin is warm.  Psychiatric: He has a normal mood and affect. His behavior is normal. Judgment and thought content normal.    ED Course  Procedures (including critical care time)  Labs Reviewed  URINALYSIS, ROUTINE W REFLEX MICROSCOPIC - Abnormal; Notable for the following:    Hgb urine dipstick LARGE (*)     Bilirubin Urine SMALL (*)     Ketones, ur TRACE (*)     Protein, ur 100 (*)     All other components within normal limits  URINE MICROSCOPIC-ADD ON - Abnormal; Notable for the following:    Bacteria, UA MANY (*)     All other components within normal limits  URINE CULTURE   No results found.   No diagnosis found.    MDM  Patient voided for ua. Foley placed as patient feels like he needs to void but only small amount of urine return.  Bladder scan done and only minimal urine in bladder.  Urine cultured.  Rocephin iv given and pain meds.  Patient will have foley left in place and be started on keflex.  He is advised follow up with his urologist Monday.     Hilario Quarry, MD 12/04/12 2104

## 2012-12-03 NOTE — ED Notes (Signed)
Pt c/o urinary retention since this morning.

## 2012-12-04 MED ORDER — OXYCODONE-ACETAMINOPHEN 5-325 MG PO TABS: 1.0000 | ORAL_TABLET | ORAL | Status: DC | PRN

## 2012-12-04 MED ORDER — CEPHALEXIN 500 MG PO CAPS: 500.0000 mg | ORAL_CAPSULE | Freq: Four times a day (QID) | ORAL | Status: DC

## 2012-12-04 NOTE — ED Notes (Signed)
Assisted pt to restroom & back to bed w/ no complications.

## 2012-12-05 ENCOUNTER — Telehealth: Payer: Self-pay | Admitting: *Deleted

## 2012-12-05 ENCOUNTER — Ambulatory Visit
Admission: RE | Admit: 2012-12-05 | Discharge: 2012-12-05 | Disposition: A | Discharge status: Home / Self Care | Payer: Medicare Other | Source: Ambulatory Visit | Attending: Radiation Oncology | Admitting: Radiation Oncology

## 2012-12-05 VITALS — BP 144/96 | HR 109 | Temp 97.7°F | Wt 201.4 lb

## 2012-12-05 DIAGNOSIS — C61 Malignant neoplasm of prostate: Secondary | ICD-10-CM | POA: Diagnosis not present

## 2012-12-05 DIAGNOSIS — R300 Dysuria: Secondary | ICD-10-CM | POA: Diagnosis not present

## 2012-12-05 DIAGNOSIS — Z79899 Other long term (current) drug therapy: Secondary | ICD-10-CM | POA: Diagnosis not present

## 2012-12-05 DIAGNOSIS — Z510 Encounter for antineoplastic radiation therapy: Secondary | ICD-10-CM | POA: Diagnosis not present

## 2012-12-05 DIAGNOSIS — R351 Nocturia: Secondary | ICD-10-CM | POA: Diagnosis not present

## 2012-12-05 DIAGNOSIS — I10 Essential (primary) hypertension: Secondary | ICD-10-CM

## 2012-12-05 DIAGNOSIS — R110 Nausea: Secondary | ICD-10-CM | POA: Diagnosis not present

## 2012-12-05 LAB — URINE CULTURE
Colony Count: NO GROWTH
Culture: NO GROWTH

## 2012-12-05 NOTE — Progress Notes (Signed)
Pt has apparent UTI, but unable to tolerate Keflex.  He has [facility]

## 2012-12-05 NOTE — Telephone Encounter (Signed)
Patient daughter betty Alla Feeling called stating"Dad went to [facility] Saturday, and has a foley cath and  Does have an UTI," was put on antibiotic, patient took on empty stomach and was nauseated and threw up, called Dr. Margarita Grizzle and was placed on another antibiotic, but patient is afraid to take, says he's afraid he'll throw up again, he is drinking flat ginger ale and that is helping some, checked patient med list and is not on any nausea meds, encouraged patient to come for rad tx and after or before he can see MD , daughter will let Mr. donnavan, covault [place] for call back 9:37 AM

## 2012-12-05 NOTE — Progress Notes (Signed)
Patient here for assessment of pain on urination and nausea and vomiting since starting  Cephalexin on 12/04/12.A prescription has [facility] [place].I will call to inquire about rx and dosage.See previous note by Gloriann Loan.

## 2012-12-06 ENCOUNTER — Telehealth: Payer: Self-pay | Admitting: *Deleted

## 2012-12-06 ENCOUNTER — Ambulatory Visit
Admission: RE | Admit: 2012-12-06 | Discharge: 2012-12-06 | Disposition: A | Discharge status: Home / Self Care | Payer: Medicare Other | Source: Ambulatory Visit | Attending: Radiation Oncology | Admitting: Radiation Oncology

## 2012-12-06 ENCOUNTER — Encounter: Payer: Self-pay | Admitting: Radiation Oncology

## 2012-12-06 VITALS — BP 140/90 | HR 80 | Temp 97.8°F | Resp 20 | Wt 201.6 lb

## 2012-12-06 DIAGNOSIS — R351 Nocturia: Secondary | ICD-10-CM | POA: Diagnosis not present

## 2012-12-06 DIAGNOSIS — C61 Malignant neoplasm of prostate: Secondary | ICD-10-CM

## 2012-12-06 DIAGNOSIS — R110 Nausea: Secondary | ICD-10-CM | POA: Diagnosis not present

## 2012-12-06 DIAGNOSIS — Z510 Encounter for antineoplastic radiation therapy: Secondary | ICD-10-CM | POA: Diagnosis not present

## 2012-12-06 DIAGNOSIS — R300 Dysuria: Secondary | ICD-10-CM | POA: Diagnosis not present

## 2012-12-06 DIAGNOSIS — Z79899 Other long term (current) drug therapy: Secondary | ICD-10-CM | POA: Diagnosis not present

## 2012-12-06 NOTE — Telephone Encounter (Signed)
Daughter called again, stating"Steve Bailey is still nauseated, drinks ginger ale, wasn't given any med for nausea when he saw Dr.manning yesterday,maybe Steve Bailey forgot to tell MD",was given rx Bactrim,which patient has taken, informed her MD off will have on call MD see pateint after rad tx today,and see if rx can be written ,daughter thanked [place] for return call 9:12 AM

## 2012-12-06 NOTE — Progress Notes (Signed)
Patient here  After rad tx prostate again, c/o nausea still even with taking new RX Bactrim for his UTI, took ortho vitals manually, sitting=170/100,p=75,rr=20,t=97.8, room air sats=98% Standing b/p=140/90,p=80,rr=20,  Has irregular heart beat , patient squirming around a lot, "Have to go to the bathroom ,not much comes out though", patient stated last time he threw up emesis was yesterday morning, had toast, applesauce  And 2 bottle water, now state" no bowel movement since Saturday" , requesting something for his nausea and sleep, hasn't had much sleep up most night, slept only 2-3 hours stated 2:19 PM

## 2012-12-06 NOTE — Progress Notes (Signed)
Weekly Management Note Current Dose: 17.55  Gy  Projected Dose: 78 Gy   Narrative:  The patient presents for routine under treatment assessment.  CBCT/MVCT images/Port film x-rays were reviewed.  The chart was checked. Asked to be seen prior to tx due to nausea since Saturday. No BM since Saturday as well. No abdominal pain but some distension. No fever or chills.   Physical Findings: Weight: 201 lb 9.6 oz (91.445 kg). Unchanged  Impression:  The patient is tolerating radiation.  Plan:  Continue treatment as planned. Pt suggested using milk of magnesia which I think is a good place to start. He will let us know tomorrow how it goes. If having BM does not relieve nausea, can add anti-emetic although cuase for nausea would be unclear.

## 2012-12-07 ENCOUNTER — Ambulatory Visit
Admission: RE | Admit: 2012-12-07 | Discharge: 2012-12-07 | Disposition: A | Discharge status: Home / Self Care | Payer: Medicare Other | Source: Ambulatory Visit | Attending: Radiation Oncology | Admitting: Radiation Oncology

## 2012-12-07 ENCOUNTER — Telehealth: Payer: Self-pay | Admitting: *Deleted

## 2012-12-07 DIAGNOSIS — C61 Malignant neoplasm of prostate: Secondary | ICD-10-CM

## 2012-12-07 DIAGNOSIS — Z79899 Other long term (current) drug therapy: Secondary | ICD-10-CM | POA: Diagnosis not present

## 2012-12-07 DIAGNOSIS — Z510 Encounter for antineoplastic radiation therapy: Secondary | ICD-10-CM | POA: Diagnosis not present

## 2012-12-07 DIAGNOSIS — R300 Dysuria: Secondary | ICD-10-CM | POA: Diagnosis not present

## 2012-12-07 DIAGNOSIS — R351 Nocturia: Secondary | ICD-10-CM | POA: Diagnosis not present

## 2012-12-07 DIAGNOSIS — R110 Nausea: Secondary | ICD-10-CM | POA: Diagnosis not present

## 2012-12-07 MED FILL — Oxycodone w/ Acetaminophen Tab 5-325 MG: ORAL | Qty: 6 | Status: AC

## 2012-12-07 NOTE — Telephone Encounter (Signed)
error 

## 2012-12-07 NOTE — Telephone Encounter (Signed)
[facility] [place] [facility]spoke with Michelle,pharmacist, for 10mg  compazine 1 every 6 hours prn,nausea, dispense#30, 2 refills, called treatment [place]#2, spoke with Irving Burton, she has patient on the table and he was informed to [facility] his [facility] and pick up his Rx 2:04 PM

## 2012-12-08 ENCOUNTER — Ambulatory Visit
Admission: RE | Admit: 2012-12-08 | Discharge: 2012-12-08 | Disposition: A | Discharge status: Home / Self Care | Payer: Medicare Other | Source: Ambulatory Visit | Attending: Radiation Oncology | Admitting: Radiation Oncology

## 2012-12-08 DIAGNOSIS — Z510 Encounter for antineoplastic radiation therapy: Secondary | ICD-10-CM | POA: Diagnosis not present

## 2012-12-08 DIAGNOSIS — R110 Nausea: Secondary | ICD-10-CM | POA: Diagnosis not present

## 2012-12-08 DIAGNOSIS — Z79899 Other long term (current) drug therapy: Secondary | ICD-10-CM | POA: Diagnosis not present

## 2012-12-08 DIAGNOSIS — R300 Dysuria: Secondary | ICD-10-CM | POA: Diagnosis not present

## 2012-12-08 DIAGNOSIS — R351 Nocturia: Secondary | ICD-10-CM | POA: Diagnosis not present

## 2012-12-08 DIAGNOSIS — C61 Malignant neoplasm of prostate: Secondary | ICD-10-CM | POA: Diagnosis not present

## 2012-12-09 ENCOUNTER — Encounter (HOSPITAL_COMMUNITY): Payer: Self-pay | Admitting: Emergency Medicine

## 2012-12-09 ENCOUNTER — Ambulatory Visit
Admission: RE | Admit: 2012-12-09 | Discharge: 2012-12-09 | Disposition: A | Discharge status: Home / Self Care | Payer: Medicare Other | Source: Ambulatory Visit | Attending: Radiation Oncology | Admitting: Radiation Oncology

## 2012-12-09 ENCOUNTER — Emergency Department (HOSPITAL_COMMUNITY)
Admission: EM | Admit: 2012-12-09 | Discharge: 2012-12-09 | Disposition: A | Discharge status: Home / Self Care | Payer: Medicare Other | Attending: Emergency Medicine | Admitting: Emergency Medicine

## 2012-12-09 ENCOUNTER — Encounter: Payer: Self-pay | Admitting: Radiation Oncology

## 2012-12-09 VITALS — BP 136/81 | HR 115 | Temp 98.5°F | Resp 20 | Wt 200.4 lb

## 2012-12-09 DIAGNOSIS — R110 Nausea: Secondary | ICD-10-CM | POA: Diagnosis not present

## 2012-12-09 DIAGNOSIS — C61 Malignant neoplasm of prostate: Secondary | ICD-10-CM

## 2012-12-09 DIAGNOSIS — R300 Dysuria: Secondary | ICD-10-CM | POA: Diagnosis not present

## 2012-12-09 DIAGNOSIS — E119 Type 2 diabetes mellitus without complications: Secondary | ICD-10-CM | POA: Diagnosis not present

## 2012-12-09 DIAGNOSIS — I4891 Unspecified atrial fibrillation: Secondary | ICD-10-CM | POA: Diagnosis not present

## 2012-12-09 DIAGNOSIS — I2510 Atherosclerotic heart disease of native coronary artery without angina pectoris: Secondary | ICD-10-CM | POA: Insufficient documentation

## 2012-12-09 DIAGNOSIS — I10 Essential (primary) hypertension: Secondary | ICD-10-CM | POA: Insufficient documentation

## 2012-12-09 DIAGNOSIS — R112 Nausea with vomiting, unspecified: Secondary | ICD-10-CM | POA: Insufficient documentation

## 2012-12-09 DIAGNOSIS — R42 Dizziness and giddiness: Secondary | ICD-10-CM

## 2012-12-09 DIAGNOSIS — Z87891 Personal history of nicotine dependence: Secondary | ICD-10-CM | POA: Diagnosis not present

## 2012-12-09 DIAGNOSIS — Y842 Radiological procedure and radiotherapy as the cause of abnormal reaction of the patient, or of later complication, without mention of misadventure at the time of the procedure: Secondary | ICD-10-CM | POA: Insufficient documentation

## 2012-12-09 DIAGNOSIS — T66XXXA Radiation sickness, unspecified, initial encounter: Secondary | ICD-10-CM | POA: Diagnosis not present

## 2012-12-09 DIAGNOSIS — E785 Hyperlipidemia, unspecified: Secondary | ICD-10-CM | POA: Diagnosis not present

## 2012-12-09 DIAGNOSIS — Z794 Long term (current) use of insulin: Secondary | ICD-10-CM | POA: Insufficient documentation

## 2012-12-09 DIAGNOSIS — R630 Anorexia: Secondary | ICD-10-CM | POA: Insufficient documentation

## 2012-12-09 DIAGNOSIS — H9209 Otalgia, unspecified ear: Secondary | ICD-10-CM | POA: Diagnosis not present

## 2012-12-09 DIAGNOSIS — R51 Headache: Secondary | ICD-10-CM | POA: Diagnosis not present

## 2012-12-09 DIAGNOSIS — E860 Dehydration: Secondary | ICD-10-CM | POA: Diagnosis not present

## 2012-12-09 DIAGNOSIS — Z79899 Other long term (current) drug therapy: Secondary | ICD-10-CM | POA: Diagnosis not present

## 2012-12-09 DIAGNOSIS — E079 Disorder of thyroid, unspecified: Secondary | ICD-10-CM | POA: Diagnosis not present

## 2012-12-09 DIAGNOSIS — R351 Nocturia: Secondary | ICD-10-CM | POA: Diagnosis not present

## 2012-12-09 DIAGNOSIS — Z510 Encounter for antineoplastic radiation therapy: Secondary | ICD-10-CM | POA: Diagnosis not present

## 2012-12-09 LAB — URINALYSIS, ROUTINE W REFLEX MICROSCOPIC
Bilirubin Urine: NEGATIVE
Glucose, UA: NEGATIVE mg/dL
Ketones, ur: 40 mg/dL — AB
Leukocytes, UA: NEGATIVE
Nitrite: NEGATIVE
Protein, ur: NEGATIVE mg/dL
Specific Gravity, Urine: >1.03 — ABNORMAL HIGH (ref 1.005–1.030)
Urobilinogen, UA: 0.2 mg/dL (ref 0.0–1.0)
pH: 6 (ref 5.0–8.0)

## 2012-12-09 LAB — COMPREHENSIVE METABOLIC PANEL
ALT: 31 U/L (ref 0–53)
AST: 39 U/L — ABNORMAL HIGH (ref 0–37)
Albumin: 3.1 g/dL — ABNORMAL LOW (ref 3.5–5.2)
Alkaline Phosphatase: 64 U/L (ref 39–117)
BUN: 21 mg/dL (ref 6–23)
CO2: 27 mEq/L (ref 19–32)
Calcium: 8.1 mg/dL — ABNORMAL LOW (ref 8.4–10.5)
Chloride: 101 mEq/L (ref 96–112)
Creatinine, Ser: 1.53 mg/dL — ABNORMAL HIGH (ref 0.50–1.35)
GFR calc Af Amer: 49 mL/min — ABNORMAL LOW (ref >90)
GFR calc non Af Amer: 42 mL/min — ABNORMAL LOW (ref >90)
Glucose, Bld: 126 mg/dL — ABNORMAL HIGH (ref 70–99)
Potassium: 3.8 mEq/L (ref 3.5–5.1)
Sodium: 137 mEq/L (ref 135–145)
Total Bilirubin: 0.9 mg/dL (ref 0.3–1.2)
Total Protein: 5.5 g/dL — ABNORMAL LOW (ref 6.0–8.3)

## 2012-12-09 LAB — CBC
HCT: 34 % — ABNORMAL LOW (ref 39.0–52.0)
Hemoglobin: 12 g/dL — ABNORMAL LOW (ref 13.0–17.0)
MCH: 32.2 pg (ref 26.0–34.0)
MCHC: 35.3 g/dL (ref 30.0–36.0)
MCV: 91.2 fL (ref 78.0–100.0)
Platelets: 135 10*3/uL — ABNORMAL LOW (ref 150–400)
RBC: 3.73 MIL/uL — ABNORMAL LOW (ref 4.22–5.81)
RDW: 13.9 % (ref 11.5–15.5)
WBC: 4.8 10*3/uL (ref 4.0–10.5)

## 2012-12-09 LAB — URINE MICROSCOPIC-ADD ON

## 2012-12-09 MED ORDER — SODIUM CHLORIDE 0.9 % IV BOLUS (SEPSIS): 1000.0000 mL | Freq: Once | INTRAVENOUS | Status: DC

## 2012-12-09 MED ORDER — SODIUM CHLORIDE 0.9 % IV BOLUS (SEPSIS)
1000.0000 mL | Freq: Once | INTRAVENOUS | Status: AC
  Administered 2012-12-09: 1000 mL via INTRAVENOUS

## 2012-12-09 NOTE — ED Provider Notes (Signed)
History     CSN: [id number]  Arrival date & time 12/09/12  4098   First MD Initiated Contact with Patient 12/09/12 1030      Chief Complaint  Patient presents with  . Dizziness  . Headache  . Otalgia    (Consider location/radiation/quality/duration/timing/severity/associated sxs/prior treatment) HPI Comments: 79 y/o male currently undergoing radiation treatment for prostate CA presents to the ED with his wife complaining of dizziness, decreased appetite and nausea since Sunday. Admits to vomiting twice on Monday and Tuesday and has lost 15 pounds since then. His left ear has been bothering him which he believes is making him dizzy. He called his doctor's office who prescribed him compazine which has not been helping. He was constipated earlier in the week which was relieved by milk of magnesia. He was treated on 1/11 for UTI which he is no longer experiencing any of these symptoms. Received radiation on Wednesday, however states his oncologist Dr. Michell Heinrich did not mention anything about weight loss.   The history is provided by the patient and the spouse.    Past Medical History  Diagnosis Date  . DM (diabetes mellitus)   . Hyperlipidemia   . Tobacco abuse   . CAD (coronary artery disease)     remote CABG in 1997; prior PCI to the LAD i n1990; Nuclear study in February of 2010 with an EF of 60% with repeat cath showing distal LAD disease. Managed medically  . 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 Atrial fibrillation dx 10/10/12    started on med 10/10/12  . Hypertension dx 10/10/12  . Thyroid disease dx 10/10/12    started on Synthroid    Past Surgical History  Procedure Date  . Coronary artery bypass graft 11-1995    shows distal LAD disease, without recurrent symptoms of angina with questionable mild apical ischemia but with normal EF   . Angioplasty     of his left anterior descending previously in 1990.  . Cardiovascular stress test 12-2008    showed  EF 60%  . Cataract extraction 2010    bilateral  . Knee surgery     left, arthroscopic  . Carpal tunnel release     right hand  . Esophagogastroduodenoscopy (egd) with esophageal dilation 05/2012    Family History  Problem Relation Age of Onset  . Stroke Father   . Heart attack Mother   . Cancer Sister     lung  . Cancer Brother     prostate  . Diabetes Brother   . Stroke Brother   . Stroke Brother   . Heart disease Brother   . Heart disease Brother   . Heart attack Sister   . Heart disease Sister   . Heart attack Sister   . Cancer Sister     breast  . Heart disease Sister   . Heart attack Sister   . Heart disease Sister   . Heart disease Sister     History  Substance Use Topics  . Smoking status: Former Smoker    Quit date: 10/14/1979  . Smokeless tobacco: Former Neurosurgeon    Types: Chew    Quit date: 11/23/1978  . Alcohol Use: No      Review of Systems  Constitutional: Positive for appetite change and unexpected weight change. Negative for fatigue.  HENT: Negative for neck pain and neck stiffness.   Eyes: Negative for visual disturbance.  Respiratory: Negative for shortness of breath.  Cardiovascular: Negative for chest pain.  Gastrointestinal: Positive for nausea, vomiting and constipation.  Genitourinary: Negative.   Musculoskeletal: Negative for back pain.  Skin: Positive for pallor.  Neurological: Positive for dizziness and headaches. Negative for weakness.  Psychiatric/Behavioral: Negative for confusion.    Allergies  Cephalexin  Home Medications   Current Outpatient Rx  Name  Route  Sig  Dispense  Refill  . ALFUZOSIN HCL ER 10 MG PO TB24   Oral   Take 10 mg by mouth daily.         . ATORVASTATIN CALCIUM 40 MG PO TABS   Oral   Take 1 tablet (40 mg total) by mouth daily.   90 tablet   3   . CINNAMON 500 MG PO CAPS   Oral   Take 500 mg by mouth 2 (two) times daily.         Marland Kitchen EZETIMIBE 10 MG PO TABS   Oral   Take 1 tablet (10 mg  total) by mouth daily.   90 tablet   1   . FINASTERIDE 5 MG PO TABS   Oral   Take 5 mg by mouth daily.         Marland Kitchen HUMULIN 70/30 [place]   Subcutaneous   Inject 20 Units into the skin 2 (two) times daily.          Marland Kitchen LEVOTHYROXINE SODIUM 25 MCG PO TABS   Oral   Take 1 tablet (25 mcg total) by mouth daily.   30 tablet   11   . LISINOPRIL 20 MG PO TABS   Oral   Take 1 tablet (20 mg total) by mouth 2 (two) times daily.   60 tablet   6   . MAGNESIUM HYDROXIDE 400 MG/5ML PO SUSP   Oral   Take 30 mLs by mouth daily as needed. constipation         . METOPROLOL SUCCINATE ER 50 MG PO TB24   Oral   Take 1 tablet (50 mg total) by mouth daily. Take with or immediately following a meal.   90 tablet   0   . OVER THE COUNTER MEDICATION   Oral   Take 1 tablet by mouth daily. Mega Red Capsules         . OXYCODONE-ACETAMINOPHEN 5-325 MG PO TABS   Oral   Take 1 tablet by mouth every 4 (four) hours as needed for pain.   6 tablet   0   . PROCHLORPERAZINE MALEATE 10 MG PO TABS   Oral   Take 10 mg by mouth every 6 (six) hours as needed. For nausea         . RIVAROXABAN 20 MG PO TABS   Oral   Take 1 tablet (20 mg total) by mouth daily with supper.   30 tablet   6   . SULFAMETHOXAZOLE-TMP DS 800-160 MG PO TABS   Oral   Take 1 tablet by mouth 2 (two) times daily.           BP 134/63  Pulse 57  Temp 97.9 F (36.6 C) (Oral)  Resp 15  Ht 6' (1.829 m)  Wt 185 lb (83.915 kg)  BMI 25.09 kg/m2  SpO2 95%  Physical Exam  Nursing note and vitals reviewed. Constitutional: He is oriented to person, place, and time. He appears well-developed and well-nourished. No distress.  HENT:  Head: Normocephalic and atraumatic.  Right Ear: Tympanic membrane normal.  Left Ear: Tympanic membrane normal.  Mouth/Throat: Mucous  membranes are dry.  Eyes: Conjunctivae normal and EOM are normal. Pupils are equal, round, and reactive to light.  Neck: Normal range of motion. Neck supple.    Cardiovascular: Normal rate and intact distal pulses.  An irregularly irregular rhythm present.  Pulmonary/Chest: Effort normal and breath sounds normal.  Abdominal: Soft. Bowel sounds are normal. There is no tenderness.  Musculoskeletal: Normal range of motion. He exhibits no edema.  Neurological: He is alert and oriented to person, place, and time.  Skin: Skin is warm and dry.  Psychiatric: He has a normal mood and affect. His speech is normal and behavior is normal. Thought content normal.    ED Course  Procedures (including critical care time)  Labs Reviewed  CBC - Abnormal; Notable for the following:    RBC 3.73 (*)     Hemoglobin 12.0 (*)     HCT 34.0 (*)     Platelets 135 (*)     All other components within normal limits  COMPREHENSIVE METABOLIC PANEL - Abnormal; Notable for the following:    Glucose, Bld 126 (*)     Creatinine, Ser 1.53 (*)     Calcium 8.1 (*)     Total Protein 5.5 (*)     Albumin 3.1 (*)     AST 39 (*)     GFR calc non Af Amer 42 (*)     GFR calc Af Amer 49 (*)     All other components within normal limits  URINALYSIS, ROUTINE W REFLEX MICROSCOPIC - Abnormal; Notable for the following:    APPearance CLOUDY (*)     Specific Gravity, Urine >1.030 (*)     Hgb urine dipstick MODERATE (*)     Ketones, ur 40 (*)     All other components within normal limits  URINE MICROSCOPIC-ADD ON - Abnormal; Notable for the following:    Casts HYALINE CASTS (*)     Crystals CA OXALATE CRYSTALS (*)     All other components within normal limits   No results found.   1. Dehydration   2. Dizziness       MDM  79 y/o male with dizziness and dehydration. He is feeling "much better and ready to go home" after receiving fluids. He has radiation later today and will see his oncologist. No acute findings concerning patient's symptoms. Patient is currently undergoing radiation for prostate CA. He is stable for discharge. Case discussed with Dr. Lynelle Doctor who agrees with pan  of care. Return precautions discussed.        Trevor Mace, PA-C 12/09/12 1400

## 2012-12-09 NOTE — Progress Notes (Signed)
Patient here before rad tx prostate, was at [facility] today , had 1 liter IVF's, no c/p nausea now, feels much better, slight dysuria at times, slight urgency , "I haven't fell down lately" \4:32 PM

## 2012-12-09 NOTE — ED Provider Notes (Signed)
Medical screening examination/treatment/procedure(s) were performed by non-physician practitioner and as supervising physician I was immediately available for consultation/collaboration. Devoria Albe, MD, Armando Gang   Ward Givens, MD 12/09/12 [phone]

## 2012-12-09 NOTE — ED Notes (Addendum)
States he has been feeling dizzy, not able to eat, complains of a left sided headache and left ear pain.  States he did vomit on Monday or Tuesday.

## 2012-12-09 NOTE — Progress Notes (Signed)
  Radiation Oncology         [phone] ________________________________  Name: Steve Bailey MRN: [id number]  Date: 12/09/2012  DOB: 07/25/1934  Weekly Radiation Therapy Management  Current Dose: Reviewed  Narrative . . . . . . . . The patient presents for routine under treatment assessment.                                                      The patient is without complaint.                                 Set-up films were reviewed.                                 The chart was checked. Physical Findings. . .  weight is 200 lb 6.4 oz (90.901 kg). His oral temperature is 98.5 F (36.9 C). His blood pressure is 136/81 and his pulse is 115. His respiration is 20 and oxygen saturation is 100%. . Weight essentially stable.  No significant changes. Impression . . . . . . . The patient is  tolerating radiation. Plan . . . . . . . . . . . . Continue treatment as planned.  ________________________________  Artist Pais. Kathrynn Running, M.D.

## 2012-12-09 NOTE — ED Notes (Signed)
Patient states that he unhooked himself from the monitor and went to the bathroom, states he is unable to provide a urine sample at this time.  Pt reoriented to call bell use.

## 2012-12-09 NOTE — ED Notes (Signed)
Reports that he is currently undergoing radiation treatments at [facility] for prostate CA.

## 2012-12-12 ENCOUNTER — Ambulatory Visit
Admission: RE | Admit: 2012-12-12 | Discharge: 2012-12-12 | Disposition: A | Discharge status: Home / Self Care | Payer: Medicare Other | Source: Ambulatory Visit | Attending: Radiation Oncology | Admitting: Radiation Oncology

## 2012-12-12 DIAGNOSIS — R300 Dysuria: Secondary | ICD-10-CM | POA: Diagnosis not present

## 2012-12-12 DIAGNOSIS — Z510 Encounter for antineoplastic radiation therapy: Secondary | ICD-10-CM | POA: Diagnosis not present

## 2012-12-12 DIAGNOSIS — R110 Nausea: Secondary | ICD-10-CM | POA: Diagnosis not present

## 2012-12-12 DIAGNOSIS — Z79899 Other long term (current) drug therapy: Secondary | ICD-10-CM | POA: Diagnosis not present

## 2012-12-12 DIAGNOSIS — C61 Malignant neoplasm of prostate: Secondary | ICD-10-CM | POA: Diagnosis not present

## 2012-12-12 DIAGNOSIS — R351 Nocturia: Secondary | ICD-10-CM | POA: Diagnosis not present

## 2012-12-13 ENCOUNTER — Ambulatory Visit
Admission: RE | Admit: 2012-12-13 | Discharge: 2012-12-13 | Disposition: A | Discharge status: Home / Self Care | Payer: Medicare Other | Source: Ambulatory Visit | Attending: Radiation Oncology | Admitting: Radiation Oncology

## 2012-12-13 DIAGNOSIS — R351 Nocturia: Secondary | ICD-10-CM | POA: Diagnosis not present

## 2012-12-13 DIAGNOSIS — C61 Malignant neoplasm of prostate: Secondary | ICD-10-CM | POA: Diagnosis not present

## 2012-12-13 DIAGNOSIS — R300 Dysuria: Secondary | ICD-10-CM | POA: Diagnosis not present

## 2012-12-13 DIAGNOSIS — Z79899 Other long term (current) drug therapy: Secondary | ICD-10-CM | POA: Diagnosis not present

## 2012-12-13 DIAGNOSIS — R110 Nausea: Secondary | ICD-10-CM | POA: Diagnosis not present

## 2012-12-13 DIAGNOSIS — Z510 Encounter for antineoplastic radiation therapy: Secondary | ICD-10-CM | POA: Diagnosis not present

## 2012-12-14 ENCOUNTER — Ambulatory Visit
Admission: RE | Admit: 2012-12-14 | Discharge: 2012-12-14 | Disposition: A | Discharge status: Home / Self Care | Payer: Medicare Other | Source: Ambulatory Visit | Attending: Radiation Oncology | Admitting: Radiation Oncology

## 2012-12-14 DIAGNOSIS — R351 Nocturia: Secondary | ICD-10-CM | POA: Diagnosis not present

## 2012-12-14 DIAGNOSIS — C61 Malignant neoplasm of prostate: Secondary | ICD-10-CM | POA: Diagnosis not present

## 2012-12-14 DIAGNOSIS — R110 Nausea: Secondary | ICD-10-CM | POA: Diagnosis not present

## 2012-12-14 DIAGNOSIS — Z79899 Other long term (current) drug therapy: Secondary | ICD-10-CM | POA: Diagnosis not present

## 2012-12-14 DIAGNOSIS — R300 Dysuria: Secondary | ICD-10-CM | POA: Diagnosis not present

## 2012-12-14 DIAGNOSIS — Z510 Encounter for antineoplastic radiation therapy: Secondary | ICD-10-CM | POA: Diagnosis not present

## 2012-12-15 ENCOUNTER — Ambulatory Visit
Admission: RE | Admit: 2012-12-15 | Discharge: 2012-12-15 | Disposition: A | Discharge status: Home / Self Care | Payer: Medicare Other | Source: Ambulatory Visit | Attending: Radiation Oncology | Admitting: Radiation Oncology

## 2012-12-15 DIAGNOSIS — Z510 Encounter for antineoplastic radiation therapy: Secondary | ICD-10-CM | POA: Diagnosis not present

## 2012-12-15 DIAGNOSIS — R351 Nocturia: Secondary | ICD-10-CM | POA: Diagnosis not present

## 2012-12-15 DIAGNOSIS — C61 Malignant neoplasm of prostate: Secondary | ICD-10-CM | POA: Diagnosis not present

## 2012-12-15 DIAGNOSIS — R300 Dysuria: Secondary | ICD-10-CM | POA: Diagnosis not present

## 2012-12-15 DIAGNOSIS — Z79899 Other long term (current) drug therapy: Secondary | ICD-10-CM | POA: Diagnosis not present

## 2012-12-15 DIAGNOSIS — R110 Nausea: Secondary | ICD-10-CM | POA: Diagnosis not present

## 2012-12-16 ENCOUNTER — Ambulatory Visit
Admission: RE | Admit: 2012-12-16 | Discharge: 2012-12-16 | Disposition: A | Discharge status: Home / Self Care | Payer: Medicare Other | Source: Ambulatory Visit | Attending: Radiation Oncology | Admitting: Radiation Oncology

## 2012-12-16 ENCOUNTER — Encounter: Payer: Self-pay | Admitting: Radiation Oncology

## 2012-12-16 VITALS — BP 182/72 | HR 64 | Temp 98.8°F | Resp 20 | Wt 204.9 lb

## 2012-12-16 DIAGNOSIS — R300 Dysuria: Secondary | ICD-10-CM | POA: Diagnosis not present

## 2012-12-16 DIAGNOSIS — C61 Malignant neoplasm of prostate: Secondary | ICD-10-CM | POA: Diagnosis not present

## 2012-12-16 DIAGNOSIS — R110 Nausea: Secondary | ICD-10-CM | POA: Diagnosis not present

## 2012-12-16 DIAGNOSIS — Z79899 Other long term (current) drug therapy: Secondary | ICD-10-CM | POA: Diagnosis not present

## 2012-12-16 DIAGNOSIS — Z510 Encounter for antineoplastic radiation therapy: Secondary | ICD-10-CM | POA: Diagnosis not present

## 2012-12-16 DIAGNOSIS — R351 Nocturia: Secondary | ICD-10-CM | POA: Diagnosis not present

## 2012-12-16 NOTE — Progress Notes (Addendum)
Pt denies pain, bowel issues, fatigue. He states he has nocturia x 6, dysuria. Pt states he completed course of Bactrim.  Pt was in [facility] ED last week for n/v,vertigo. He denies those problems today.

## 2012-12-16 NOTE — Progress Notes (Signed)
   Department of Radiation Oncology  Phone:  [phone] Fax:        [phone]  Weekly Treatment Note    Name: Steve Bailey Date: 12/16/2012 MRN: [id number] DOB: 1934/02/29   Current dose: 33.15 Gy  Current fraction: 17   MEDICATIONS: Current Outpatient Prescriptions  Medication Sig Dispense Refill  . alfuzosin (UROXATRAL) 10 MG 24 hr tablet Take 10 mg by mouth daily.      Marland Kitchen atorvastatin (LIPITOR) 40 MG tablet Take 1 tablet (40 mg total) by mouth daily.  90 tablet  3  . Cinnamon 500 MG capsule Take 500 mg by mouth 2 (two) times daily.      Marland Kitchen ezetimibe (ZETIA) 10 MG tablet Take 1 tablet (10 mg total) by mouth daily.  90 tablet  1  . finasteride (PROSCAR) 5 MG tablet Take 5 mg by mouth daily.      . Insulin Isophane & Regular (HUMULIN 70/30 [place]) Inject 20 Units into the skin 2 (two) times daily.       Marland Kitchen levothyroxine (SYNTHROID, LEVOTHROID) 25 MCG tablet Take 1 tablet (25 mcg total) by mouth daily.  30 tablet  11  . lisinopril (PRINIVIL,ZESTRIL) 20 MG tablet Take 1 tablet (20 mg total) by mouth 2 (two) times daily.  60 tablet  6  . magnesium hydroxide (MILK OF MAGNESIA) 400 MG/5ML suspension Take 30 mLs by mouth daily as needed. constipation      . metoprolol succinate (TOPROL-XL) 50 MG 24 hr tablet Take 1 tablet (50 mg total) by mouth daily. Take with or immediately following a meal.  90 tablet  0  . OVER THE COUNTER MEDICATION Take 1 tablet by mouth daily. Mega Red Capsules      . oxyCODONE-acetaminophen (PERCOCET/ROXICET) 5-325 MG per tablet Take 1 tablet by mouth every 4 (four) hours as needed for pain.  6 tablet  0  . prochlorperazine (COMPAZINE) 10 MG tablet Take 10 mg by mouth every 6 (six) hours as needed. For nausea      . Rivaroxaban (XARELTO) 20 MG TABS Take 1 tablet (20 mg total) by mouth daily with supper.  30 tablet  6     ALLERGIES: Cephalexin   LABORATORY DATA:  Lab Results  Component Value Date   WBC 4.8 12/09/2012   HGB 12.0* 12/09/2012   HCT 34.0*  12/09/2012   MCV 91.2 12/09/2012   PLT 135* 12/09/2012   Lab Results  Component Value Date   NA 137 12/09/2012   K 3.8 12/09/2012   CL 101 12/09/2012   CO2 27 12/09/2012   Lab Results  Component Value Date   ALT 31 12/09/2012   AST 39* 12/09/2012   ALKPHOS 64 12/09/2012   BILITOT 0.9 12/09/2012     NARRATIVE: Steve Bailey was seen today for weekly treatment management. The chart was checked and the patient's films were reviewed. The patient is doing well. He has some ongoing nocturia and dysuria which have been stable. No diarrhea. No nausea.  PHYSICAL EXAMINATION: weight is 204 lb 14.4 oz (92.942 kg). His oral temperature is 98.8 F (37.1 C). His blood pressure is 182/72 and his pulse is 64. His respiration is 20.        ASSESSMENT: The patient is doing satisfactorily with treatment.  PLAN: We will continue with the patient's radiation treatment as planned.

## 2012-12-19 ENCOUNTER — Ambulatory Visit
Admission: RE | Admit: 2012-12-19 | Discharge: 2012-12-19 | Disposition: A | Discharge status: Home / Self Care | Payer: Medicare Other | Source: Ambulatory Visit | Attending: Radiation Oncology | Admitting: Radiation Oncology

## 2012-12-19 ENCOUNTER — Other Ambulatory Visit: Payer: Self-pay | Admitting: *Deleted

## 2012-12-19 DIAGNOSIS — Z79899 Other long term (current) drug therapy: Secondary | ICD-10-CM | POA: Diagnosis not present

## 2012-12-19 DIAGNOSIS — R110 Nausea: Secondary | ICD-10-CM | POA: Diagnosis not present

## 2012-12-19 DIAGNOSIS — C61 Malignant neoplasm of prostate: Secondary | ICD-10-CM | POA: Diagnosis not present

## 2012-12-19 DIAGNOSIS — Z510 Encounter for antineoplastic radiation therapy: Secondary | ICD-10-CM | POA: Diagnosis not present

## 2012-12-19 DIAGNOSIS — R351 Nocturia: Secondary | ICD-10-CM | POA: Diagnosis not present

## 2012-12-19 DIAGNOSIS — R300 Dysuria: Secondary | ICD-10-CM | POA: Diagnosis not present

## 2012-12-19 MED ORDER — LISINOPRIL 20 MG PO TABS: 20.0000 mg | ORAL_TABLET | Freq: Two times a day (BID) | ORAL | Status: DC

## 2012-12-20 ENCOUNTER — Ambulatory Visit
Admission: RE | Admit: 2012-12-20 | Discharge: 2012-12-20 | Disposition: A | Discharge status: Home / Self Care | Payer: Medicare Other | Source: Ambulatory Visit | Attending: Radiation Oncology | Admitting: Radiation Oncology

## 2012-12-20 DIAGNOSIS — Z510 Encounter for antineoplastic radiation therapy: Secondary | ICD-10-CM | POA: Diagnosis not present

## 2012-12-20 DIAGNOSIS — Z79899 Other long term (current) drug therapy: Secondary | ICD-10-CM | POA: Diagnosis not present

## 2012-12-20 DIAGNOSIS — R110 Nausea: Secondary | ICD-10-CM | POA: Diagnosis not present

## 2012-12-20 DIAGNOSIS — C61 Malignant neoplasm of prostate: Secondary | ICD-10-CM | POA: Diagnosis not present

## 2012-12-20 DIAGNOSIS — R351 Nocturia: Secondary | ICD-10-CM | POA: Diagnosis not present

## 2012-12-20 DIAGNOSIS — R300 Dysuria: Secondary | ICD-10-CM | POA: Diagnosis not present

## 2012-12-21 ENCOUNTER — Ambulatory Visit
Admission: RE | Admit: 2012-12-21 | Discharge: 2012-12-21 | Disposition: A | Discharge status: Home / Self Care | Payer: Medicare Other | Source: Ambulatory Visit | Attending: Radiation Oncology | Admitting: Radiation Oncology

## 2012-12-21 DIAGNOSIS — Z510 Encounter for antineoplastic radiation therapy: Secondary | ICD-10-CM | POA: Diagnosis not present

## 2012-12-21 DIAGNOSIS — Z79899 Other long term (current) drug therapy: Secondary | ICD-10-CM | POA: Diagnosis not present

## 2012-12-21 DIAGNOSIS — R300 Dysuria: Secondary | ICD-10-CM | POA: Diagnosis not present

## 2012-12-21 DIAGNOSIS — R110 Nausea: Secondary | ICD-10-CM | POA: Diagnosis not present

## 2012-12-21 DIAGNOSIS — R351 Nocturia: Secondary | ICD-10-CM | POA: Diagnosis not present

## 2012-12-21 DIAGNOSIS — C61 Malignant neoplasm of prostate: Secondary | ICD-10-CM | POA: Diagnosis not present

## 2012-12-22 ENCOUNTER — Ambulatory Visit
Admission: RE | Admit: 2012-12-22 | Discharge: 2012-12-22 | Disposition: A | Discharge status: Home / Self Care | Payer: Medicare Other | Source: Ambulatory Visit | Attending: Radiation Oncology | Admitting: Radiation Oncology

## 2012-12-22 DIAGNOSIS — R110 Nausea: Secondary | ICD-10-CM | POA: Diagnosis not present

## 2012-12-22 DIAGNOSIS — Z510 Encounter for antineoplastic radiation therapy: Secondary | ICD-10-CM | POA: Diagnosis not present

## 2012-12-22 DIAGNOSIS — C61 Malignant neoplasm of prostate: Secondary | ICD-10-CM | POA: Diagnosis not present

## 2012-12-22 DIAGNOSIS — Z79899 Other long term (current) drug therapy: Secondary | ICD-10-CM | POA: Diagnosis not present

## 2012-12-22 DIAGNOSIS — R300 Dysuria: Secondary | ICD-10-CM | POA: Diagnosis not present

## 2012-12-22 DIAGNOSIS — R351 Nocturia: Secondary | ICD-10-CM | POA: Diagnosis not present

## 2012-12-23 ENCOUNTER — Ambulatory Visit
Admission: RE | Admit: 2012-12-23 | Discharge: 2012-12-23 | Disposition: A | Discharge status: Home / Self Care | Payer: Medicare Other | Source: Ambulatory Visit | Attending: Radiation Oncology | Admitting: Radiation Oncology

## 2012-12-23 ENCOUNTER — Encounter: Payer: Self-pay | Admitting: Radiation Oncology

## 2012-12-23 VITALS — BP 176/81 | HR 63 | Temp 97.8°F | Resp 20 | Wt 203.0 lb

## 2012-12-23 DIAGNOSIS — R110 Nausea: Secondary | ICD-10-CM | POA: Diagnosis not present

## 2012-12-23 DIAGNOSIS — C61 Malignant neoplasm of prostate: Secondary | ICD-10-CM | POA: Diagnosis not present

## 2012-12-23 DIAGNOSIS — Z510 Encounter for antineoplastic radiation therapy: Secondary | ICD-10-CM | POA: Diagnosis not present

## 2012-12-23 DIAGNOSIS — R351 Nocturia: Secondary | ICD-10-CM | POA: Diagnosis not present

## 2012-12-23 DIAGNOSIS — R300 Dysuria: Secondary | ICD-10-CM | POA: Diagnosis not present

## 2012-12-23 DIAGNOSIS — T66XXXA Radiation sickness, unspecified, initial encounter: Secondary | ICD-10-CM

## 2012-12-23 DIAGNOSIS — N3090 Cystitis, unspecified without hematuria: Secondary | ICD-10-CM

## 2012-12-23 DIAGNOSIS — Z79899 Other long term (current) drug therapy: Secondary | ICD-10-CM | POA: Diagnosis not present

## 2012-12-23 DIAGNOSIS — E079 Disorder of thyroid, unspecified: Secondary | ICD-10-CM

## 2012-12-23 MED ORDER — PHENAZOPYRIDINE HCL 200 MG PO TABS: 200.0000 mg | ORAL_TABLET | Freq: Three times a day (TID) | ORAL | Status: DC | PRN

## 2012-12-23 NOTE — Progress Notes (Signed)
Patient  Rad tx prostate 22/40 completed, alert,oriented x3, ,still has dysuria, takes 1/2 teaspoon MOM daily for constipation, up 6x last night to void, increased frequency /urgency, bottom is irritated Steve Bailey,  Eating and drinking 3-4 bottles water daily, gator ade v-8 juice too 2:19 PM

## 2012-12-23 NOTE — Progress Notes (Signed)
  Radiation Oncology         [phone] ________________________________  Name: Steve Bailey MRN: [id number]  Date: 12/23/2012  DOB: 03-Aug-1934  Weekly Radiation Therapy Management  Current Dose: 42.9 Gy     Planned Dose:  78 Gy  Narrative . . . . . . . . The patient presents for routine under treatment assessment.                                                        The patient is without complaint.  Dysuria.                                 Set-up films were reviewed.                                 The chart was checked. Physical Findings. . .  weight is 203 lb (92.08 kg). His oral temperature is 97.8 F (36.6 C). His blood pressure is 176/81 and his pulse is 63. His respiration is 20. . Weight essentially stable.  No significant changes. Impression . . . . . . . The patient is  tolerating radiation. Plan . . . . . . . . . . . . Continue treatment as planned.  Given Pyridium.  ________________________________  [name], M.D.

## 2012-12-26 ENCOUNTER — Ambulatory Visit
Admission: RE | Admit: 2012-12-26 | Discharge: 2012-12-26 | Disposition: A | Discharge status: Home / Self Care | Payer: Medicare Other | Source: Ambulatory Visit | Attending: Radiation Oncology | Admitting: Radiation Oncology

## 2012-12-26 DIAGNOSIS — R300 Dysuria: Secondary | ICD-10-CM | POA: Diagnosis not present

## 2012-12-26 DIAGNOSIS — R351 Nocturia: Secondary | ICD-10-CM | POA: Diagnosis not present

## 2012-12-26 DIAGNOSIS — C61 Malignant neoplasm of prostate: Secondary | ICD-10-CM | POA: Diagnosis not present

## 2012-12-26 DIAGNOSIS — R110 Nausea: Secondary | ICD-10-CM | POA: Diagnosis not present

## 2012-12-26 DIAGNOSIS — Z79899 Other long term (current) drug therapy: Secondary | ICD-10-CM | POA: Diagnosis not present

## 2012-12-26 DIAGNOSIS — Z510 Encounter for antineoplastic radiation therapy: Secondary | ICD-10-CM | POA: Diagnosis not present

## 2012-12-27 ENCOUNTER — Ambulatory Visit
Admission: RE | Admit: 2012-12-27 | Discharge: 2012-12-27 | Disposition: A | Discharge status: Home / Self Care | Payer: Medicare Other | Source: Ambulatory Visit | Attending: Radiation Oncology | Admitting: Radiation Oncology

## 2012-12-27 DIAGNOSIS — R110 Nausea: Secondary | ICD-10-CM | POA: Diagnosis not present

## 2012-12-27 DIAGNOSIS — Z79899 Other long term (current) drug therapy: Secondary | ICD-10-CM | POA: Diagnosis not present

## 2012-12-27 DIAGNOSIS — R300 Dysuria: Secondary | ICD-10-CM | POA: Diagnosis not present

## 2012-12-27 DIAGNOSIS — Z510 Encounter for antineoplastic radiation therapy: Secondary | ICD-10-CM | POA: Diagnosis not present

## 2012-12-27 DIAGNOSIS — C61 Malignant neoplasm of prostate: Secondary | ICD-10-CM | POA: Diagnosis not present

## 2012-12-27 DIAGNOSIS — R351 Nocturia: Secondary | ICD-10-CM | POA: Diagnosis not present

## 2012-12-28 ENCOUNTER — Ambulatory Visit
Admission: RE | Admit: 2012-12-28 | Discharge: 2012-12-28 | Disposition: A | Discharge status: Home / Self Care | Payer: Medicare Other | Source: Ambulatory Visit | Attending: Radiation Oncology | Admitting: Radiation Oncology

## 2012-12-28 DIAGNOSIS — C61 Malignant neoplasm of prostate: Secondary | ICD-10-CM | POA: Diagnosis not present

## 2012-12-28 DIAGNOSIS — R300 Dysuria: Secondary | ICD-10-CM | POA: Diagnosis not present

## 2012-12-28 DIAGNOSIS — Z79899 Other long term (current) drug therapy: Secondary | ICD-10-CM | POA: Diagnosis not present

## 2012-12-28 DIAGNOSIS — R351 Nocturia: Secondary | ICD-10-CM | POA: Diagnosis not present

## 2012-12-28 DIAGNOSIS — Z510 Encounter for antineoplastic radiation therapy: Secondary | ICD-10-CM | POA: Diagnosis not present

## 2012-12-28 DIAGNOSIS — R110 Nausea: Secondary | ICD-10-CM | POA: Diagnosis not present

## 2012-12-29 ENCOUNTER — Ambulatory Visit
Admission: RE | Admit: 2012-12-29 | Discharge: 2012-12-29 | Disposition: A | Discharge status: Home / Self Care | Payer: Medicare Other | Source: Ambulatory Visit | Attending: Radiation Oncology | Admitting: Radiation Oncology

## 2012-12-29 DIAGNOSIS — Z510 Encounter for antineoplastic radiation therapy: Secondary | ICD-10-CM | POA: Diagnosis not present

## 2012-12-29 DIAGNOSIS — Z79899 Other long term (current) drug therapy: Secondary | ICD-10-CM | POA: Diagnosis not present

## 2012-12-29 DIAGNOSIS — R110 Nausea: Secondary | ICD-10-CM | POA: Diagnosis not present

## 2012-12-29 DIAGNOSIS — R351 Nocturia: Secondary | ICD-10-CM | POA: Diagnosis not present

## 2012-12-29 DIAGNOSIS — C61 Malignant neoplasm of prostate: Secondary | ICD-10-CM | POA: Diagnosis not present

## 2012-12-29 DIAGNOSIS — R300 Dysuria: Secondary | ICD-10-CM | POA: Diagnosis not present

## 2012-12-30 ENCOUNTER — Ambulatory Visit
Admission: RE | Admit: 2012-12-30 | Discharge: 2012-12-30 | Disposition: A | Discharge status: Home / Self Care | Payer: Medicare Other | Source: Ambulatory Visit | Attending: Radiation Oncology | Admitting: Radiation Oncology

## 2012-12-30 ENCOUNTER — Encounter: Payer: Self-pay | Admitting: Radiation Oncology

## 2012-12-30 VITALS — BP 185/89 | HR 68 | Temp 98.8°F | Resp 20 | Wt 200.5 lb

## 2012-12-30 DIAGNOSIS — R110 Nausea: Secondary | ICD-10-CM | POA: Diagnosis not present

## 2012-12-30 DIAGNOSIS — C61 Malignant neoplasm of prostate: Secondary | ICD-10-CM

## 2012-12-30 DIAGNOSIS — Z510 Encounter for antineoplastic radiation therapy: Secondary | ICD-10-CM | POA: Diagnosis not present

## 2012-12-30 DIAGNOSIS — R300 Dysuria: Secondary | ICD-10-CM | POA: Diagnosis not present

## 2012-12-30 DIAGNOSIS — Z79899 Other long term (current) drug therapy: Secondary | ICD-10-CM | POA: Diagnosis not present

## 2012-12-30 DIAGNOSIS — R351 Nocturia: Secondary | ICD-10-CM | POA: Diagnosis not present

## 2012-12-30 NOTE — Progress Notes (Signed)
  Radiation Oncology         [phone] ________________________________  Name: Steve Bailey MRN: [id number]  Date: 12/30/2012  DOB: 12-07-1933  Weekly Radiation Therapy Management  Current Dose: 52.65 Gy     Planned Dose:  78 Gy  Narrative . . . . . . . . The patient presents for routine under treatment assessment.                                                      Patient here prostate ca rad txs 27/40 completed, alert,oriented x3, takes pyridium 1 po q 12 hours ,says hardly any pain anymore", takes MOM q 2-3 days keeps his bowels regular, eating well, drinking plenty fluids                                  Set-up films were reviewed.                                 The chart was checked. Physical Findings. . .  weight is 200 lb 8 oz (90.946 kg). His oral temperature is 98.8 F (37.1 C). His blood pressure is 185/89 and his pulse is 68. His respiration is 20. . Weight essentially stable.  No significant changes. Impression . . . . . . . The patient is tolerating radiation. Plan . . . . . . . . . . . . Continue treatment as planned.  ________________________________  Artist Pais. Kathrynn Running, M.D.

## 2012-12-30 NOTE — Progress Notes (Signed)
Patient here prostate ca rad txs 27/40 completed, alert,oriented x3, takes pyridium 1 po q 12 hours ,says hardly any pain anymore", takes MOM q 2-3 days keeps his bowels regular, eating well, drinking plenty fluids ,  2:21 PM

## 2013-01-02 ENCOUNTER — Ambulatory Visit
Admission: RE | Admit: 2013-01-02 | Discharge: 2013-01-02 | Disposition: A | Discharge status: Home / Self Care | Payer: Medicare Other | Source: Ambulatory Visit | Attending: Radiation Oncology | Admitting: Radiation Oncology

## 2013-01-02 DIAGNOSIS — Z79899 Other long term (current) drug therapy: Secondary | ICD-10-CM | POA: Diagnosis not present

## 2013-01-02 DIAGNOSIS — C61 Malignant neoplasm of prostate: Secondary | ICD-10-CM | POA: Diagnosis not present

## 2013-01-02 DIAGNOSIS — R300 Dysuria: Secondary | ICD-10-CM | POA: Diagnosis not present

## 2013-01-02 DIAGNOSIS — R110 Nausea: Secondary | ICD-10-CM | POA: Diagnosis not present

## 2013-01-02 DIAGNOSIS — Z510 Encounter for antineoplastic radiation therapy: Secondary | ICD-10-CM | POA: Diagnosis not present

## 2013-01-02 DIAGNOSIS — R351 Nocturia: Secondary | ICD-10-CM | POA: Diagnosis not present

## 2013-01-03 ENCOUNTER — Ambulatory Visit
Admission: RE | Admit: 2013-01-03 | Discharge: 2013-01-03 | Disposition: A | Discharge status: Home / Self Care | Payer: Medicare Other | Source: Ambulatory Visit | Attending: Radiation Oncology | Admitting: Radiation Oncology

## 2013-01-03 DIAGNOSIS — R110 Nausea: Secondary | ICD-10-CM | POA: Diagnosis not present

## 2013-01-03 DIAGNOSIS — C61 Malignant neoplasm of prostate: Secondary | ICD-10-CM | POA: Diagnosis not present

## 2013-01-03 DIAGNOSIS — Z79899 Other long term (current) drug therapy: Secondary | ICD-10-CM | POA: Diagnosis not present

## 2013-01-03 DIAGNOSIS — Z510 Encounter for antineoplastic radiation therapy: Secondary | ICD-10-CM | POA: Diagnosis not present

## 2013-01-03 DIAGNOSIS — R300 Dysuria: Secondary | ICD-10-CM | POA: Diagnosis not present

## 2013-01-03 DIAGNOSIS — R351 Nocturia: Secondary | ICD-10-CM | POA: Diagnosis not present

## 2013-01-04 ENCOUNTER — Ambulatory Visit
Admission: RE | Admit: 2013-01-04 | Discharge: 2013-01-04 | Disposition: A | Discharge status: Home / Self Care | Payer: Medicare Other | Source: Ambulatory Visit | Attending: Radiation Oncology | Admitting: Radiation Oncology

## 2013-01-04 DIAGNOSIS — R351 Nocturia: Secondary | ICD-10-CM | POA: Diagnosis not present

## 2013-01-04 DIAGNOSIS — R300 Dysuria: Secondary | ICD-10-CM | POA: Diagnosis not present

## 2013-01-04 DIAGNOSIS — R110 Nausea: Secondary | ICD-10-CM | POA: Diagnosis not present

## 2013-01-04 DIAGNOSIS — Z79899 Other long term (current) drug therapy: Secondary | ICD-10-CM | POA: Diagnosis not present

## 2013-01-04 DIAGNOSIS — C61 Malignant neoplasm of prostate: Secondary | ICD-10-CM | POA: Diagnosis not present

## 2013-01-04 DIAGNOSIS — Z510 Encounter for antineoplastic radiation therapy: Secondary | ICD-10-CM | POA: Diagnosis not present

## 2013-01-05 ENCOUNTER — Ambulatory Visit
Admission: RE | Admit: 2013-01-05 | Discharge: 2013-01-05 | Disposition: A | Discharge status: Home / Self Care | Payer: Medicare Other | Source: Ambulatory Visit | Attending: Radiation Oncology | Admitting: Radiation Oncology

## 2013-01-05 DIAGNOSIS — R300 Dysuria: Secondary | ICD-10-CM | POA: Diagnosis not present

## 2013-01-05 DIAGNOSIS — R110 Nausea: Secondary | ICD-10-CM | POA: Diagnosis not present

## 2013-01-05 DIAGNOSIS — Z79899 Other long term (current) drug therapy: Secondary | ICD-10-CM | POA: Diagnosis not present

## 2013-01-05 DIAGNOSIS — Z510 Encounter for antineoplastic radiation therapy: Secondary | ICD-10-CM | POA: Diagnosis not present

## 2013-01-05 DIAGNOSIS — C61 Malignant neoplasm of prostate: Secondary | ICD-10-CM | POA: Diagnosis not present

## 2013-01-05 DIAGNOSIS — R351 Nocturia: Secondary | ICD-10-CM | POA: Diagnosis not present

## 2013-01-06 ENCOUNTER — Ambulatory Visit
Admission: RE | Admit: 2013-01-06 | Discharge: 2013-01-06 | Disposition: A | Discharge status: Home / Self Care | Payer: Medicare Other | Source: Ambulatory Visit | Attending: Radiation Oncology | Admitting: Radiation Oncology

## 2013-01-06 DIAGNOSIS — C61 Malignant neoplasm of prostate: Secondary | ICD-10-CM | POA: Diagnosis not present

## 2013-01-06 DIAGNOSIS — Z79899 Other long term (current) drug therapy: Secondary | ICD-10-CM | POA: Diagnosis not present

## 2013-01-06 DIAGNOSIS — Z510 Encounter for antineoplastic radiation therapy: Secondary | ICD-10-CM | POA: Diagnosis not present

## 2013-01-06 DIAGNOSIS — R351 Nocturia: Secondary | ICD-10-CM | POA: Diagnosis not present

## 2013-01-06 DIAGNOSIS — R110 Nausea: Secondary | ICD-10-CM | POA: Diagnosis not present

## 2013-01-06 DIAGNOSIS — R300 Dysuria: Secondary | ICD-10-CM | POA: Diagnosis not present

## 2013-01-09 ENCOUNTER — Ambulatory Visit
Admission: RE | Admit: 2013-01-09 | Discharge: 2013-01-09 | Disposition: A | Discharge status: Home / Self Care | Payer: Medicare Other | Source: Ambulatory Visit | Attending: Radiation Oncology | Admitting: Radiation Oncology

## 2013-01-09 DIAGNOSIS — Z510 Encounter for antineoplastic radiation therapy: Secondary | ICD-10-CM | POA: Diagnosis not present

## 2013-01-09 DIAGNOSIS — R351 Nocturia: Secondary | ICD-10-CM | POA: Diagnosis not present

## 2013-01-09 DIAGNOSIS — Z79899 Other long term (current) drug therapy: Secondary | ICD-10-CM | POA: Diagnosis not present

## 2013-01-09 DIAGNOSIS — R110 Nausea: Secondary | ICD-10-CM | POA: Diagnosis not present

## 2013-01-09 DIAGNOSIS — R300 Dysuria: Secondary | ICD-10-CM | POA: Diagnosis not present

## 2013-01-09 DIAGNOSIS — C61 Malignant neoplasm of prostate: Secondary | ICD-10-CM | POA: Diagnosis not present

## 2013-01-10 ENCOUNTER — Ambulatory Visit
Admission: RE | Admit: 2013-01-10 | Discharge: 2013-01-10 | Disposition: A | Discharge status: Home / Self Care | Payer: Medicare Other | Source: Ambulatory Visit | Attending: Radiation Oncology | Admitting: Radiation Oncology

## 2013-01-10 DIAGNOSIS — Z79899 Other long term (current) drug therapy: Secondary | ICD-10-CM | POA: Diagnosis not present

## 2013-01-10 DIAGNOSIS — Z510 Encounter for antineoplastic radiation therapy: Secondary | ICD-10-CM | POA: Diagnosis not present

## 2013-01-10 DIAGNOSIS — R300 Dysuria: Secondary | ICD-10-CM | POA: Diagnosis not present

## 2013-01-10 DIAGNOSIS — C61 Malignant neoplasm of prostate: Secondary | ICD-10-CM | POA: Diagnosis not present

## 2013-01-10 DIAGNOSIS — R110 Nausea: Secondary | ICD-10-CM | POA: Diagnosis not present

## 2013-01-10 DIAGNOSIS — R351 Nocturia: Secondary | ICD-10-CM | POA: Diagnosis not present

## 2013-01-11 ENCOUNTER — Ambulatory Visit
Admission: RE | Admit: 2013-01-11 | Discharge: 2013-01-11 | Disposition: A | Discharge status: Home / Self Care | Payer: Medicare Other | Source: Ambulatory Visit | Attending: Radiation Oncology | Admitting: Radiation Oncology

## 2013-01-11 ENCOUNTER — Encounter: Payer: Self-pay | Admitting: Radiation Oncology

## 2013-01-11 VITALS — BP 146/68 | HR 66 | Temp 97.8°F | Resp 20 | Wt 203.5 lb

## 2013-01-11 DIAGNOSIS — R351 Nocturia: Secondary | ICD-10-CM | POA: Diagnosis not present

## 2013-01-11 DIAGNOSIS — Z510 Encounter for antineoplastic radiation therapy: Secondary | ICD-10-CM | POA: Diagnosis not present

## 2013-01-11 DIAGNOSIS — C61 Malignant neoplasm of prostate: Secondary | ICD-10-CM | POA: Diagnosis not present

## 2013-01-11 DIAGNOSIS — R110 Nausea: Secondary | ICD-10-CM | POA: Diagnosis not present

## 2013-01-11 DIAGNOSIS — R300 Dysuria: Secondary | ICD-10-CM | POA: Diagnosis not present

## 2013-01-11 DIAGNOSIS — Z79899 Other long term (current) drug therapy: Secondary | ICD-10-CM | POA: Diagnosis not present

## 2013-01-11 NOTE — Progress Notes (Signed)
  Radiation Oncology         [phone] ________________________________  Name: Steve Bailey MRN: [id number]  Date: 01/11/2013  DOB: [birth year]  Weekly Radiation Therapy Management  Current Dose: 68.25 Gy     Planned Dose:  78 Gy  Narrative . . . . . . . . The patient presents for routine under treatment assessment.                                                     Patient rad tx prostate 35/40 completed, alert,oriented x3, pyridium taken today ansd last 2 days, mom 1/2 teasponn days he needs it stated", no c/o pain, bowels regular, drinking plenty fluids, slight dysuria"NOt too bad now"                                 Set-up films were reviewed.                                 The chart was checked. Physical Findings. . .  weight is 203 lb 8 oz (92.307 kg). His oral temperature is 97.8 F (36.6 C). His blood pressure is 146/68 and his pulse is 66. His respiration is 20. . Weight essentially stable.  No significant changes. Impression . . . . . . . The patient is  tolerating radiation. Plan . . . . . . . . . . . . Continue treatment as planned.  ________________________________  Artist Pais. Kathrynn Running, M.D.

## 2013-01-11 NOTE — Progress Notes (Signed)
Patient rad tx prostate 35/40 completed, alert,oriented x3, pyridium taken today ansd last 2 days, mom 1/2 teasponn days he needs it stated", no c/o pain, bowels regular, drinking plenty fluids, slight dysuria"NOt too bad now" 2:57 PM

## 2013-01-12 ENCOUNTER — Ambulatory Visit
Admission: RE | Admit: 2013-01-12 | Discharge: 2013-01-12 | Disposition: A | Discharge status: Home / Self Care | Payer: Medicare Other | Source: Ambulatory Visit | Attending: Radiation Oncology | Admitting: Radiation Oncology

## 2013-01-12 DIAGNOSIS — R110 Nausea: Secondary | ICD-10-CM | POA: Diagnosis not present

## 2013-01-12 DIAGNOSIS — Z510 Encounter for antineoplastic radiation therapy: Secondary | ICD-10-CM | POA: Diagnosis not present

## 2013-01-12 DIAGNOSIS — R351 Nocturia: Secondary | ICD-10-CM | POA: Diagnosis not present

## 2013-01-12 DIAGNOSIS — C61 Malignant neoplasm of prostate: Secondary | ICD-10-CM | POA: Diagnosis not present

## 2013-01-12 DIAGNOSIS — R300 Dysuria: Secondary | ICD-10-CM | POA: Diagnosis not present

## 2013-01-12 DIAGNOSIS — Z79899 Other long term (current) drug therapy: Secondary | ICD-10-CM | POA: Diagnosis not present

## 2013-01-13 ENCOUNTER — Ambulatory Visit
Admission: RE | Admit: 2013-01-13 | Discharge: 2013-01-13 | Disposition: A | Discharge status: Home / Self Care | Payer: Medicare Other | Source: Ambulatory Visit | Attending: Radiation Oncology | Admitting: Radiation Oncology

## 2013-01-13 DIAGNOSIS — R351 Nocturia: Secondary | ICD-10-CM | POA: Diagnosis not present

## 2013-01-13 DIAGNOSIS — R110 Nausea: Secondary | ICD-10-CM | POA: Diagnosis not present

## 2013-01-13 DIAGNOSIS — C61 Malignant neoplasm of prostate: Secondary | ICD-10-CM | POA: Diagnosis not present

## 2013-01-13 DIAGNOSIS — Z79899 Other long term (current) drug therapy: Secondary | ICD-10-CM | POA: Diagnosis not present

## 2013-01-13 DIAGNOSIS — R300 Dysuria: Secondary | ICD-10-CM | POA: Diagnosis not present

## 2013-01-13 DIAGNOSIS — Z510 Encounter for antineoplastic radiation therapy: Secondary | ICD-10-CM | POA: Diagnosis not present

## 2013-01-16 ENCOUNTER — Ambulatory Visit
Admission: RE | Admit: 2013-01-16 | Discharge: 2013-01-16 | Disposition: A | Discharge status: Home / Self Care | Payer: Medicare Other | Source: Ambulatory Visit | Attending: Radiation Oncology | Admitting: Radiation Oncology

## 2013-01-16 DIAGNOSIS — R300 Dysuria: Secondary | ICD-10-CM | POA: Diagnosis not present

## 2013-01-16 DIAGNOSIS — Z510 Encounter for antineoplastic radiation therapy: Secondary | ICD-10-CM | POA: Diagnosis not present

## 2013-01-16 DIAGNOSIS — C61 Malignant neoplasm of prostate: Secondary | ICD-10-CM | POA: Diagnosis not present

## 2013-01-16 DIAGNOSIS — R110 Nausea: Secondary | ICD-10-CM | POA: Diagnosis not present

## 2013-01-16 DIAGNOSIS — Z79899 Other long term (current) drug therapy: Secondary | ICD-10-CM | POA: Diagnosis not present

## 2013-01-16 DIAGNOSIS — R351 Nocturia: Secondary | ICD-10-CM | POA: Diagnosis not present

## 2013-01-17 ENCOUNTER — Ambulatory Visit
Admission: RE | Admit: 2013-01-17 | Discharge: 2013-01-17 | Disposition: A | Discharge status: Home / Self Care | Payer: Medicare Other | Source: Ambulatory Visit | Attending: Radiation Oncology | Admitting: Radiation Oncology

## 2013-01-17 DIAGNOSIS — R300 Dysuria: Secondary | ICD-10-CM | POA: Diagnosis not present

## 2013-01-17 DIAGNOSIS — C61 Malignant neoplasm of prostate: Secondary | ICD-10-CM | POA: Diagnosis not present

## 2013-01-17 DIAGNOSIS — R351 Nocturia: Secondary | ICD-10-CM | POA: Diagnosis not present

## 2013-01-17 DIAGNOSIS — Z510 Encounter for antineoplastic radiation therapy: Secondary | ICD-10-CM | POA: Diagnosis not present

## 2013-01-17 DIAGNOSIS — R110 Nausea: Secondary | ICD-10-CM | POA: Diagnosis not present

## 2013-01-17 DIAGNOSIS — Z79899 Other long term (current) drug therapy: Secondary | ICD-10-CM | POA: Diagnosis not present

## 2013-01-18 ENCOUNTER — Encounter: Payer: Self-pay | Admitting: Radiation Oncology

## 2013-01-18 ENCOUNTER — Ambulatory Visit
Admission: RE | Admit: 2013-01-18 | Discharge: 2013-01-18 | Disposition: A | Discharge status: Home / Self Care | Payer: Medicare Other | Source: Ambulatory Visit | Attending: Radiation Oncology | Admitting: Radiation Oncology

## 2013-01-18 VITALS — BP 172/90 | HR 66 | Temp 98.0°F | Resp 20 | Wt 198.8 lb

## 2013-01-18 DIAGNOSIS — Z79899 Other long term (current) drug therapy: Secondary | ICD-10-CM | POA: Diagnosis not present

## 2013-01-18 DIAGNOSIS — C61 Malignant neoplasm of prostate: Secondary | ICD-10-CM

## 2013-01-18 DIAGNOSIS — R110 Nausea: Secondary | ICD-10-CM | POA: Diagnosis not present

## 2013-01-18 DIAGNOSIS — R351 Nocturia: Secondary | ICD-10-CM | POA: Diagnosis not present

## 2013-01-18 DIAGNOSIS — Z510 Encounter for antineoplastic radiation therapy: Secondary | ICD-10-CM | POA: Diagnosis not present

## 2013-01-18 DIAGNOSIS — R300 Dysuria: Secondary | ICD-10-CM | POA: Diagnosis not present

## 2013-01-18 NOTE — Progress Notes (Signed)
  Radiation Oncology         [phone] ________________________________  Name: Steve Bailey MRN: [id number]  Date: 01/18/2013  DOB: 1934-09-14  Weekly Radiation Therapy Management  Current Dose: 78 Gy     Planned Dose:  78 Gy  Narrative . . . . . . . . The patient presents for routine under treatment assessment.              Patient here final rad tx 40/40 prostate, alert,oriented x3, nop c/o dysuria, takes pyridium 1 x day now ,, getting better stated patient, drinks plenty fluids, regular bowels stated, energy good, 1 month f/u appt given, nocturia 5-6x stated                                 Set-up films were reviewed.                                 The chart was checked. Physical Findings. . .  weight is 198 lb 12.8 oz (90.175 kg). His oral temperature is 98 F (36.7 C). His blood pressure is 172/90 and his pulse is 66. His respiration is 20. . Weight essentially stable.  No significant changes. Impression . . . . . . . The patient is  tolerating radiation. Plan . . . . . . . . . . . . Complete treatment today, and follow-up in one month  ________________________________  Artist Pais. Kathrynn Running, M.D.

## 2013-01-18 NOTE — Progress Notes (Addendum)
Patient here final rad tx 40/40 prostate, alert,oriented x3, nop c/o dysuria, takes pyridium 1 x day now ,, getting better stated patient, drinks plenty fluids, regular bowels stated, energy good, 1 month f/u appt given, nocturia 5-6x stated 2:23 PM

## 2013-01-19 DIAGNOSIS — E119 Type 2 diabetes mellitus without complications: Secondary | ICD-10-CM | POA: Diagnosis not present

## 2013-01-22 NOTE — Progress Notes (Signed)
  Radiation Oncology         [phone] ________________________________  Name: Steve Bailey MRN: [id number]  Date: 01/18/2013  DOB: 09/13/1934  End of Treatment Note  Diagnosis:   79 yo man with stage T2a adenocarcinoma of the prostate with a Gleason's score of 4+3 and a PSA of 8.12  Indication for treatment:  Curative radiotherapy and       Radiation treatment dates:   11/24/2012-01/18/2013  Site/dose:   The prostate received 78 gray in 40 fractions of 1.95 gray  Beams/energy:   6 megavolt photons were delivered using volumetric ARC therapy or rotational IMRT, with daily cone beam CT for image guidance.  Narrative: The patient tolerated radiation treatment relatively well.   The patient experienced slight dysuria during radiation which was not severe upon completion. He did rely on a half teaspoon of milk of magnesia daily to stay regular with bowels.  Plan: The patient has completed radiation treatment. The patient will return to radiation oncology clinic for routine followup in one month. I advised them to call or return sooner if they have any questions or concerns related to their recovery or treatment. ________________________________  Artist Pais. Kathrynn Running, M.D.

## 2013-01-23 DIAGNOSIS — E119 Type 2 diabetes mellitus without complications: Secondary | ICD-10-CM | POA: Diagnosis not present

## 2013-01-23 DIAGNOSIS — R809 Proteinuria, unspecified: Secondary | ICD-10-CM | POA: Diagnosis not present

## 2013-01-24 ENCOUNTER — Ambulatory Visit (INDEPENDENT_AMBULATORY_CARE_PROVIDER_SITE_OTHER): Payer: Medicare Other | Admitting: Cardiovascular Disease

## 2013-01-24 ENCOUNTER — Encounter: Payer: Self-pay | Admitting: Cardiovascular Disease

## 2013-01-24 VITALS — BP 150/80 | HR 72 | Ht 72.0 in | Wt 201.0 lb

## 2013-01-24 DIAGNOSIS — I10 Essential (primary) hypertension: Secondary | ICD-10-CM

## 2013-01-24 DIAGNOSIS — H31019 Macula scars of posterior pole (postinflammatory) (post-traumatic), unspecified eye: Secondary | ICD-10-CM | POA: Diagnosis not present

## 2013-01-24 DIAGNOSIS — I4891 Unspecified atrial fibrillation: Secondary | ICD-10-CM

## 2013-01-24 DIAGNOSIS — E119 Type 2 diabetes mellitus without complications: Secondary | ICD-10-CM | POA: Diagnosis not present

## 2013-01-24 DIAGNOSIS — E11359 Type 2 diabetes mellitus with proliferative diabetic retinopathy without macular edema: Secondary | ICD-10-CM | POA: Diagnosis not present

## 2013-01-24 DIAGNOSIS — E11311 Type 2 diabetes mellitus with unspecified diabetic retinopathy with macular edema: Secondary | ICD-10-CM | POA: Diagnosis not present

## 2013-01-24 NOTE — Assessment & Plan Note (Signed)
BP is OK,  A bit high today.  Encouraged him to avoid salt

## 2013-01-24 NOTE — Progress Notes (Signed)
Steve Bailey Date of Birth  31-Oct-1934 [facility]      Office  [address]    [address]   [address] [place], [place]  [address]    [name], [place]  [address] [phone]  Fax  [phone]  [phone]  Fax [phone]  Problem list: 1. Coronary artery disease-status post coronary artery bypass grafting in 1997 2. Diabetes mellitus 3. Hyperlipidemia  History of Present Illness:  Steve Bailey is a 79 year old gentleman with the above-noted medical history. He was diagnosed as having atrial fibrillation last November and was started on Xarelto.  He had many radiation oncology appointments for his prostate cancer since that time. He has not yet been cardioverted.  He's completely asymptomatic. He denies any chest pain, shortness breath, syncope, or presyncope.  January 24, 2013:  Steve Bailey has done well since he was last seen by Steve Bailey.  He has been rate controlled.  No symptoms.  Current Outpatient Prescriptions on File Prior to Visit  Medication Sig Dispense Refill  . alfuzosin (UROXATRAL) 10 MG 24 hr tablet Take 10 mg by mouth daily.      Marland Kitchen atorvastatin (LIPITOR) 40 MG tablet Take 1 tablet (40 mg total) by mouth daily.  90 tablet  3  . Cinnamon 500 MG capsule Take 500 mg by mouth 2 (two) times daily.      Marland Kitchen ezetimibe (ZETIA) 10 MG tablet Take 1 tablet (10 mg total) by mouth daily.  90 tablet  1  . finasteride (PROSCAR) 5 MG tablet Take 5 mg by mouth daily.      . Insulin Isophane & Regular (HUMULIN 70/30 [place]) Inject 20 Units into the skin 2 (two) times daily.       Marland Kitchen levothyroxine (SYNTHROID, LEVOTHROID) 25 MCG tablet Take 1 tablet (25 mcg total) by mouth daily.  30 tablet  11  . lisinopril (PRINIVIL,ZESTRIL) 20 MG tablet Take 1 tablet (20 mg total) by mouth 2 (two) times daily.  180 tablet  3  . magnesium hydroxide (MILK OF MAGNESIA) 400 MG/5ML suspension Take 30 mLs by mouth daily as needed. constipation      . metoprolol succinate (TOPROL-XL) 50 MG 24 hr  tablet Take 1 tablet (50 mg total) by mouth daily. Take with or immediately following a meal.  90 tablet  0  . OVER THE COUNTER MEDICATION Take 1 tablet by mouth daily. Mega Red Capsules      . Rivaroxaban (XARELTO) 20 MG TABS Take 1 tablet (20 mg total) by mouth daily with supper.  30 tablet  6   No current facility-administered medications on file prior to visit.    Allergies  Allergen Reactions  . Cephalexin Nausea And Vomiting    Past Medical History  Diagnosis Date  . DM (diabetes mellitus)   . Hyperlipidemia   . Tobacco abuse   . CAD (coronary artery disease)     remote CABG in 1997; prior PCI to the LAD i n1990; Nuclear study in February of 2010 with an EF of 60% with repeat cath showing distal LAD disease. Managed medically  . 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 Atrial fibrillation dx 10/10/12    started on med 10/10/12  . Hypertension dx 10/10/12  . Thyroid disease dx 10/10/12    started on Synthroid    Past Surgical History  Procedure Laterality Date  . Coronary artery bypass graft  11-1995    shows distal LAD disease, without recurrent  symptoms of angina with questionable mild apical ischemia but with normal EF   . Angioplasty      of his left anterior descending previously in 1990.  . Cardiovascular stress test  12-2008    showed EF 60%  . Cataract extraction  2010    bilateral  . Knee surgery      left, arthroscopic  . Carpal tunnel release      right hand  . Esophagogastroduodenoscopy (egd) with esophageal dilation  05/2012    History  Smoking status  . Former Smoker  . Quit date: 10/14/1979  Smokeless tobacco  . Former Neurosurgeon  . Types: Chew  . Quit date: 11/23/1978    History  Alcohol Use No    Family History  Problem Relation Age of Onset  . Stroke Father   . Heart attack Mother   . Cancer Sister     lung  . Cancer Brother     prostate  . Diabetes Brother   . Stroke Brother   . Stroke Brother   . Heart disease  Brother   . Heart disease Brother   . Heart attack Sister   . Heart disease Sister   . Heart attack Sister   . Cancer Sister     breast  . Heart disease Sister   . Heart attack Sister   . Heart disease Sister   . Heart disease Sister     Reviw of Systems:  Reviewed in the HPI.  All other systems are negative.  Physical Exam: Blood pressure 150/80, pulse 72, height 6' (1.829 m), weight 201 lb (91.173 kg), SpO2 99.00%. General: Well developed, well nourished, in no acute distress.  Head: Normocephalic, atraumatic, sclera non-icteric, mucus membranes are moist,   Neck: Supple. Carotids are 2 + without bruits. No JVD  Lungs: Clear bilaterally to auscultation.  Heart: irregularly irregular  rate.  normal  S1 S2. No murmurs, gallops or rubs.  Abdomen: Soft, non-tender, non-distended with normal bowel sounds. No hepatomegaly. No rebound/guarding. No masses.  Msk:  Strength and tone are normal  Extremities: No clubbing or cyanosis. No edema.  Distal pedal pulses are 2+ and equal bilaterally.  Neuro: Alert and oriented X 3. Moves all extremities spontaneously.  Psych:  Responds to questions appropriately with a normal affect.  ECG:  Assessment / Plan:

## 2013-01-24 NOTE — Patient Instructions (Addendum)
Your physician wants you to follow-up in: 6 months with ekg  You will receive a reminder letter in the mail two months in advance. If you don't receive a letter, please call our office to schedule the follow-up appointment.  Your physician recommends that you return for a FASTING lipid profile: 6 months   REDUCE HIGH SODIUM FOODS LIKE CANNED SOUP, GRAVY, SAUCES, READY PREPARED FOODS LIKE FROZEN FOODS; LEAN CUISINE, LASAGNA. BACON, SAUSAGE, LUNCH MEAT, FAST FOODS.Marland Kitchen

## 2013-01-24 NOTE — Assessment & Plan Note (Signed)
He is rate controlled and has no symptoms.  Will continue his current meds.  I will see him again in 6 months.

## 2013-02-16 ENCOUNTER — Ambulatory Visit: Payer: Medicare Other | Admitting: Radiation Oncology

## 2013-02-21 ENCOUNTER — Encounter: Payer: Self-pay | Admitting: Radiation Oncology

## 2013-02-23 ENCOUNTER — Encounter: Payer: Self-pay | Admitting: Radiation Oncology

## 2013-02-23 ENCOUNTER — Ambulatory Visit
Admission: RE | Admit: 2013-02-23 | Discharge: 2013-02-23 | Disposition: A | Discharge status: Home / Self Care | Payer: Medicare Other | Source: Ambulatory Visit | Attending: Radiation Oncology | Admitting: Radiation Oncology

## 2013-02-23 ENCOUNTER — Other Ambulatory Visit: Payer: Self-pay | Admitting: *Deleted

## 2013-02-23 VITALS — BP 182/77 | HR 72 | Temp 98.0°F | Resp 20 | Wt 202.2 lb

## 2013-02-23 DIAGNOSIS — C61 Malignant neoplasm of prostate: Secondary | ICD-10-CM

## 2013-02-23 HISTORY — DX: Personal history of irradiation: Z92.3

## 2013-02-23 MED ORDER — EZETIMIBE 10 MG PO TABS: 10.0000 mg | ORAL_TABLET | Freq: Every day | ORAL | Status: DC

## 2013-02-23 NOTE — Progress Notes (Signed)
Radiation Oncology         [phone] ________________________________  Name: Steve Bailey MRN: [id number]  Date: 02/23/2013  DOB: Jan 13, 1934  Follow-Up Visit Note  CC: Reather Littler, MD  Milford Cage,*  Diagnosis:   79 yo man with stage T2a adenocarcinoma of the prostate with a Gleason's score of 4+3 and a PSA of 8.12 s/p curative radiotherapy 11/24/2012-01/18/2013 using IMRT to 78 Gy in 40 fractions  Interval Since Last Radiation:  1  months  Narrative:  The patient returns today for routine follow-up.  He is without any new complaints. He denies dysuria and pain. He reports a good urinary stream but he does awaken with nocturia 4-5 times nightly. He suffers with some modest urgency and frequency. The patient does report daily regular bowel movements. He reports good energy and appetite.                              ALLERGIES:  is allergic to cephalexin.  Meds: Current Outpatient Prescriptions  Medication Sig Dispense Refill  . alfuzosin (UROXATRAL) 10 MG 24 hr tablet Take 10 mg by mouth daily.      Marland Kitchen atorvastatin (LIPITOR) 40 MG tablet Take 1 tablet (40 mg total) by mouth daily.  90 tablet  3  . Cinnamon 500 MG capsule Take 500 mg by mouth 2 (two) times daily.      . finasteride (PROSCAR) 5 MG tablet Take 5 mg by mouth daily.      . Insulin Isophane & Regular (HUMULIN 70/30 [place]) Inject 20 Units into the skin 2 (two) times daily.       Marland Kitchen levothyroxine (SYNTHROID, LEVOTHROID) 25 MCG tablet Take 1 tablet (25 mcg total) by mouth daily.  30 tablet  11  . lisinopril (PRINIVIL,ZESTRIL) 20 MG tablet Take 1 tablet (20 mg total) by mouth 2 (two) times daily.  180 tablet  3  . magnesium hydroxide (MILK OF MAGNESIA) 400 MG/5ML suspension Take 30 mLs by mouth daily as needed. constipation      . metoprolol succinate (TOPROL-XL) 50 MG 24 hr tablet Take 1 tablet (50 mg total) by mouth daily. Take with or immediately following a meal.  90 tablet  0  . OVER THE COUNTER MEDICATION Take 1 tablet by  mouth daily. Mega Red Capsules      . Rivaroxaban (XARELTO) 20 MG TABS Take 1 tablet (20 mg total) by mouth daily with supper.  30 tablet  6  . ezetimibe (ZETIA) 10 MG tablet Take 1 tablet (10 mg total) by mouth daily.  90 tablet  3   No current facility-administered medications for this encounter.    Physical Findings: The patient is in no acute distress. Patient is alert and oriented.  weight is 202 lb 3.2 oz (91.717 kg). His oral temperature is 98 F (36.7 C). His blood pressure is 182/77 and his pulse is 72. His respiration is 20. Marland Kitchen  No significant changes.  Impression:  The patient is recovering from the effects of radiation.    Plan:  He will continue to follow-up with urology for ongoing PSA determinations.  I will look forward to following his response through their correspondence, and be happy to participate in care if clinically indicated.  I talked to the patient about what to expect in the future, including his risk for erectile dysfunction and rectal bleeding.  I encouraged him to call or return to the office if he has any  question about his previous radiation or possible radiation effects.  He was comfortable with this plan.  _____________________________________  Artist Pais. Kathrynn Running, M.D.

## 2013-02-23 NOTE — Progress Notes (Signed)
Patient here follow up s/p radiation prostate 11/24/12-01/18/13, 78GY/72fx Alert,oriented x3 no c/o pain, no dysuria, good stream, nocturia 4-5x, frequency/urgency , regular daily bowel movements, takes 1/2 t. Mom daily, no c/o nausea, no fatigue 10:52 AM

## 2013-02-23 NOTE — Telephone Encounter (Signed)
Fax Received. Refill Completed. Steve Bailey (R.M.A)   

## 2013-03-07 DIAGNOSIS — C61 Malignant neoplasm of prostate: Secondary | ICD-10-CM | POA: Diagnosis not present

## 2013-03-14 ENCOUNTER — Other Ambulatory Visit: Payer: Self-pay | Admitting: *Deleted

## 2013-03-14 DIAGNOSIS — R351 Nocturia: Secondary | ICD-10-CM | POA: Diagnosis not present

## 2013-03-14 DIAGNOSIS — N400 Enlarged prostate without lower urinary tract symptoms: Secondary | ICD-10-CM | POA: Diagnosis not present

## 2013-03-14 DIAGNOSIS — C61 Malignant neoplasm of prostate: Secondary | ICD-10-CM | POA: Diagnosis not present

## 2013-05-22 ENCOUNTER — Other Ambulatory Visit: Payer: Self-pay | Admitting: Endocrinology

## 2013-05-22 DIAGNOSIS — D510 Vitamin B12 deficiency anemia due to intrinsic factor deficiency: Secondary | ICD-10-CM

## 2013-05-22 DIAGNOSIS — E1059 Type 1 diabetes mellitus with other circulatory complications: Secondary | ICD-10-CM

## 2013-05-25 ENCOUNTER — Ambulatory Visit (INDEPENDENT_AMBULATORY_CARE_PROVIDER_SITE_OTHER): Payer: Medicare Other

## 2013-05-25 DIAGNOSIS — E1059 Type 1 diabetes mellitus with other circulatory complications: Secondary | ICD-10-CM | POA: Diagnosis not present

## 2013-05-25 DIAGNOSIS — D510 Vitamin B12 deficiency anemia due to intrinsic factor deficiency: Secondary | ICD-10-CM

## 2013-05-25 LAB — COMPREHENSIVE METABOLIC PANEL
ALT: 30 U/L (ref 0–53)
AST: 29 U/L (ref 0–37)
Albumin: 3.8 g/dL (ref 3.5–5.2)
Alkaline Phosphatase: 66 U/L (ref 39–117)
BUN: 19 mg/dL (ref 6–23)
CO2: 26 mEq/L (ref 19–32)
Calcium: 8.7 mg/dL (ref 8.4–10.5)
Chloride: 103 mEq/L (ref 96–112)
Creatinine, Ser: 1.1 mg/dL (ref 0.4–1.5)
GFR: 69.71 mL/min (ref >60.00)
Glucose, Bld: 241 mg/dL — ABNORMAL HIGH (ref 70–99)
Potassium: 4.8 mEq/L (ref 3.5–5.1)
Sodium: 136 mEq/L (ref 135–145)
Total Bilirubin: 1.1 mg/dL (ref 0.3–1.2)
Total Protein: 6.6 g/dL (ref 6.0–8.3)

## 2013-05-25 LAB — CBC WITH DIFFERENTIAL/PLATELET
Basophils Absolute: 0 10*3/uL (ref 0.0–0.1)
Basophils Relative: 0.4 % (ref 0.0–3.0)
Eosinophils Absolute: 0 10*3/uL (ref 0.0–0.7)
Eosinophils Relative: 0.9 % (ref 0.0–5.0)
HCT: 37.1 % — ABNORMAL LOW (ref 39.0–52.0)
Hemoglobin: 12.9 g/dL — ABNORMAL LOW (ref 13.0–17.0)
Lymphocytes Relative: 9.2 % — ABNORMAL LOW (ref 12.0–46.0)
Lymphs Abs: 0.5 10*3/uL — ABNORMAL LOW (ref 0.7–4.0)
MCHC: 34.8 g/dL (ref 30.0–36.0)
MCV: 97.4 fl (ref 78.0–100.0)
Monocytes Absolute: 0.3 10*3/uL (ref 0.1–1.0)
Monocytes Relative: 5.5 % (ref 3.0–12.0)
Neutro Abs: 4.3 10*3/uL (ref 1.4–7.7)
Neutrophils Relative %: 84 % — ABNORMAL HIGH (ref 43.0–77.0)
Platelets: 133 10*3/uL — ABNORMAL LOW (ref 150.0–400.0)
RBC: 3.81 Mil/uL — ABNORMAL LOW (ref 4.22–5.81)
RDW: 13.9 % (ref 11.5–14.6)
WBC: 5.1 10*3/uL (ref 4.5–10.5)

## 2013-05-25 LAB — VITAMIN B12: Vitamin B-12: 738 pg/mL (ref 211–911)

## 2013-05-25 LAB — MICROALBUMIN / CREATININE URINE RATIO
Creatinine,U: 253.5 mg/dL
Microalb Creat Ratio: 2.7 mg/g (ref 0.0–30.0)
Microalb, Ur: 6.8 mg/dL — ABNORMAL HIGH (ref 0.0–1.9)

## 2013-05-25 LAB — HEMOGLOBIN A1C: Hgb A1c MFr Bld: 6.3 % (ref 4.6–6.5)

## 2013-05-29 ENCOUNTER — Encounter: Payer: Self-pay | Admitting: Endocrinology

## 2013-05-29 ENCOUNTER — Ambulatory Visit (INDEPENDENT_AMBULATORY_CARE_PROVIDER_SITE_OTHER): Payer: Medicare Other | Admitting: Endocrinology

## 2013-05-29 VITALS — BP 120/70 | HR 70 | Temp 98.7°F | Ht 71.5 in | Wt 206.4 lb

## 2013-05-29 DIAGNOSIS — E7800 Pure hypercholesterolemia, unspecified: Secondary | ICD-10-CM | POA: Diagnosis not present

## 2013-05-29 DIAGNOSIS — E039 Hypothyroidism, unspecified: Secondary | ICD-10-CM

## 2013-05-29 DIAGNOSIS — E119 Type 2 diabetes mellitus without complications: Secondary | ICD-10-CM | POA: Diagnosis not present

## 2013-05-29 MED ORDER — GLUCOSE BLOOD VI STRP: ORAL_STRIP | Status: DC

## 2013-05-29 NOTE — Patient Instructions (Addendum)
Get new Accucheck strips from drugstore, cal  800 # to help reset date and time, check sugars either in am or midday or bedtime. Avoid walking barefoot and check feet daily

## 2013-05-29 NOTE — Progress Notes (Addendum)
Patient ID: Steve Bailey, male   DOB: 02-26-1934, 79 y.o.   MRN: [id number] Reason for Appointment: Diabetes follow-up   History of Present Illness   Diagnosis: Type 2 DIABETES MELITUS, date of diagnosis: 1995          Oral hypoglycemic drugs: None     Insulin regimen: Humulin 70/30, 20 units a.c. twice a day      Proper timing of medications in relation to meals: Recently taking insulin before meals  Insulin delivery device: Pens.  Glucometer: Accucheck Aviva.  Blood Glucose readings: None recently, has strips dated 2012 in his box  Hypoglycemia frequency: None recently.   Carbohydrate intake: at meals: Mostly with vegetables, also getting some lean meats like Chicken and pork but not consistently.  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    History: His blood sugars are again very well controlled with Humulin premixed insulin twice a day. HEMOGLOBIN A1c is usually near normal.  However glucose was over 200 in the lab and he thinks he was drinking a sports drink that day before coming in He is taking his insulin before breakfast and supper as discussed on the last visit.  The last HbgA1c was 5.5 on 01/19/13, and now 6.3           HYPERTENSION:  Has been present for several years.  The blood pressure control is excellent with no lightheadedness, no monitoring at home  HYPERLIPIDEMIA:         The lipid abnormality consists of elevated LDL  which was last checked in 11/13 and was 65 .     Appointment on 05/25/2013  Component Date Value Range Status  . Hemoglobin A1C 05/25/2013 6.3  4.6 - 6.5 % Final   Glycemic Control Guidelines for People with Diabetes:Non Diabetic:  <6%Goal of Therapy: <7%Additional Action Suggested:  >8%   . Sodium 05/25/2013 136  135 - 145 mEq/L Final  . Potassium 05/25/2013 4.8  3.5 - 5.1 mEq/L Final  . Chloride 05/25/2013 103  96 - 112 mEq/L Final  . CO2 05/25/2013 26  19 - 32 mEq/L Final  . Glucose, Bld 05/25/2013 241* 70 - 99 mg/dL Final  . BUN  40/98/1191 19  6 - 23 mg/dL Final  . Creatinine, Ser 05/25/2013 1.1  0.4 - 1.5 mg/dL Final  . Total Bilirubin 05/25/2013 1.1  0.3 - 1.2 mg/dL Final  . Alkaline Phosphatase 05/25/2013 66  39 - 117 U/L Final  . AST 05/25/2013 29  0 - 37 U/L Final  . ALT 05/25/2013 30  0 - 53 U/L Final  . Total Protein 05/25/2013 6.6  6.0 - 8.3 g/dL Final  . Albumin 47/82/9562 3.8  3.5 - 5.2 g/dL Final  . Calcium 13/06/6577 8.7  8.4 - 10.5 mg/dL Final  . GFR 46/96/2952 69.71  >60.00 mL/min Final  . WBC 05/25/2013 5.1  4.5 - 10.5 K/uL Final  . RBC 05/25/2013 3.81* 4.22 - 5.81 Mil/uL Final  . Hemoglobin 05/25/2013 12.9* 13.0 - 17.0 g/dL Final  . HCT 84/13/2440 37.1* 39.0 - 52.0 % Final  . MCV 05/25/2013 97.4  78.0 - 100.0 fl Final  . MCHC 05/25/2013 34.8  30.0 - 36.0 g/dL Final  . RDW 09/19/2535 13.9  11.5 - 14.6 % Final  . Platelets 05/25/2013 133.0* 150.0 - 400.0 K/uL Final  . Neutrophils Relative % 05/25/2013 84.0* 43.0 - 77.0 % Final  . Lymphocytes Relative 05/25/2013 9.2* 12.0 - 46.0 % Final  . Monocytes Relative 05/25/2013 5.5  3.0 -  12.0 % Final  . Eosinophils Relative 05/25/2013 0.9  0.0 - 5.0 % Final  . Basophils Relative 05/25/2013 0.4  0.0 - 3.0 % Final  . Neutro Abs 05/25/2013 4.3  1.4 - 7.7 K/uL Final  . Lymphs Abs 05/25/2013 0.5* 0.7 - 4.0 K/uL Final  . Monocytes Absolute 05/25/2013 0.3  0.1 - 1.0 K/uL Final  . Eosinophils Absolute 05/25/2013 0.0  0.0 - 0.7 K/uL Final  . Basophils Absolute 05/25/2013 0.0  0.0 - 0.1 K/uL Final  . Microalb, Ur 05/25/2013 6.8* 0.0 - 1.9 mg/dL Final  . Creatinine,U 09/81/1914 253.5   Final  . Microalb Creat Ratio 05/25/2013 2.7  0.0 - 30.0 mg/g Final  . Vitamin B-12 05/25/2013 738  211 - 911 pg/mL Final      Medication List       This list is accurate as of: 05/29/13  9:24 AM.  Always use your most recent med list.               alfuzosin 10 MG 24 hr tablet  Commonly known as:  UROXATRAL  Take 10 mg by mouth daily.     atorvastatin 40 MG tablet   Commonly known as:  LIPITOR  Take 1 tablet (40 mg total) by mouth daily.     Cinnamon 500 MG capsule  Take 500 mg by mouth 2 (two) times daily.     ezetimibe 10 MG tablet  Commonly known as:  ZETIA  Take 1 tablet (10 mg total) by mouth daily.     finasteride 5 MG tablet  Commonly known as:  PROSCAR  Take 5 mg by mouth daily.     HUMULIN 70/30 [place]  Inject 20 Units into the skin 2 (two) times daily.     levothyroxine 25 MCG tablet  Commonly known as:  SYNTHROID, LEVOTHROID  Take 1 tablet (25 mcg total) by mouth daily.     lisinopril 20 MG tablet  Commonly known as:  PRINIVIL,ZESTRIL  Take 1 tablet (20 mg total) by mouth 2 (two) times daily.     magnesium hydroxide 400 MG/5ML suspension  Commonly known as:  MILK OF MAGNESIA  Take 30 mLs by mouth daily as needed. constipation     metoprolol succinate 50 MG 24 hr tablet  Commonly known as:  TOPROL-XL  Take 1 tablet (50 mg total) by mouth daily. Take with or immediately following a meal.     OVER THE COUNTER MEDICATION  Take 1 tablet by mouth daily. Mega Red Capsules     Rivaroxaban 20 MG Tabs  Commonly known as:  XARELTO  Take 1 tablet (20 mg total) by mouth daily with supper.        Allergies:  Allergies  Allergen Reactions  . Cephalexin Nausea And Vomiting    Past Medical History  Diagnosis Date  . DM (diabetes mellitus)   . Hyperlipidemia   . Tobacco abuse   . CAD (coronary artery disease)     remote CABG in 1997; prior PCI to the LAD i n1990; Nuclear study in February of 2010 with an EF of 60% with repeat cath showing distal LAD disease. Managed medically  . 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 Atrial fibrillation dx 10/10/12    started on med 10/10/12  . Hypertension dx 10/10/12  . Thyroid disease dx 10/10/12    started on Synthroid  . History of radiation therapy 11/24/12-01/18/13    Prostate 78Gy/47fx    Past  Surgical History  Procedure Laterality Date  . Coronary artery bypass  graft  11-1995    shows distal LAD disease, without recurrent symptoms of angina with questionable mild apical ischemia but with normal EF   . Angioplasty      of his left anterior descending previously in 1990.  . Cardiovascular stress test  12-2008    showed EF 60%  . Cataract extraction  2010    bilateral  . Knee surgery      left, arthroscopic  . Carpal tunnel release      right hand  . Esophagogastroduodenoscopy (egd) with esophageal dilation  05/2012    Family History  Problem Relation Age of Onset  . Stroke Father   . Heart attack Mother   . Cancer Sister     lung  . Cancer Brother     prostate  . Diabetes Brother   . Stroke Brother   . Stroke Brother   . Heart disease Brother   . Heart disease Brother   . Heart attack Sister   . Heart disease Sister   . Heart attack Sister   . Cancer Sister     breast  . Heart disease Sister   . Heart attack Sister   . Heart disease Sister   . Heart disease Sister     Social History:  reports that he quit smoking about 33 years ago. He quit smokeless tobacco use about 34 years ago. His smokeless tobacco use included Chew. He reports that he does not drink alcohol or use illicit drugs.  Review of Systems  Cardiovascular: Negative for leg swelling.  Neurological: Negative for weakness.       He has no numbness, some burning in his feet not interfering with sleep  Endo/Heme/Allergies:       He has a diagnosis of hypothyroidism and known results of TSH available; he does not think he is taking any thyroid medication even though it is on his chart     Examination:   BP 120/70  Pulse 70  Temp(Src) 98.7 F (37.1 C) (Oral)  Ht 5' 11.5" (1.816 m)  Wt 206 lb 6 oz (93.611 kg)  BMI 28.39 kg/m2  SpO2 95%  Body mass index is 28.39 kg/(m^2).   Diabetic foot exam done  Assesment:   1. Diabetes type 2  The patient's diabetes control appears to be well controlled but he is not monitoring his blood sugars at all. His A1c is  excellent at 6.3 and he had no hypoglycemia. He is compliant with his insulin before meals and appears to have a stable control despite taking premixed insulin. Discussed adding protein to each meal especially in summer when he is getting more starchy vegetables. Also advised to avoid drinks with sugar including sports drinks which made his glucose high on the lab work. Emphasized the need to check blood sugars at various times and also take insulin consistently before breakfast and supper. Discussed foot care and diabetic neuropathy  Counseling time over 50% of today's 20 minute visit  Complications: Only increase microalbumin, last level was 118 in 2/14  2. History of hypercholesterolemia, will need to recheck his lipid levels today and labs will be added on  3. Reduced pedal pulses but no symptoms of claudication  ? Hypothyroidism: No history available on the chart but the diagnosis has been entered; this needs to be substantiated and will recheck TSH today  PLAN:  Currently not on any oral hypoglycemics like metformin and will continue  on insulin alone at the same doses  To check home blood sugars on waking up or 2 hours after any meal. He will get strips at the drugstore for the Accu-Chek, advised him not to use expired strips  Walking regularly when not being active otherwise  Medication changes:  none   Corianne Buccellato 05/29/2013, 9:24 AM

## 2013-06-13 DIAGNOSIS — C61 Malignant neoplasm of prostate: Secondary | ICD-10-CM | POA: Diagnosis not present

## 2013-06-19 ENCOUNTER — Other Ambulatory Visit: Payer: Self-pay | Admitting: Nurse Practitioner

## 2013-06-23 ENCOUNTER — Other Ambulatory Visit: Payer: Self-pay | Admitting: Cardiovascular Disease

## 2013-07-14 ENCOUNTER — Other Ambulatory Visit: Payer: Self-pay | Admitting: *Deleted

## 2013-07-14 ENCOUNTER — Other Ambulatory Visit: Payer: Self-pay | Admitting: Endocrinology

## 2013-07-14 MED ORDER — INSULIN NPH ISOPHANE & REGULAR (70-30) 100 UNIT/ML ~~LOC~~ SUSP: SUBCUTANEOUS | Status: DC

## 2013-07-28 ENCOUNTER — Other Ambulatory Visit: Payer: Self-pay | Admitting: *Deleted

## 2013-07-28 MED ORDER — INSULIN ISOPHANE & REGULAR (HUMAN 70-30)100 UNIT/ML KWIKPEN: 18.0000 [IU] | PEN_INJECTOR | Freq: Every morning | SUBCUTANEOUS | Status: DC

## 2013-07-28 NOTE — Telephone Encounter (Signed)
Rx ordered wrong. Reordering.

## 2013-07-31 ENCOUNTER — Other Ambulatory Visit: Payer: Self-pay | Admitting: *Deleted

## 2013-08-09 ENCOUNTER — Encounter: Payer: Self-pay | Admitting: Nurse Practitioner

## 2013-08-09 ENCOUNTER — Ambulatory Visit (INDEPENDENT_AMBULATORY_CARE_PROVIDER_SITE_OTHER): Payer: Medicare Other | Admitting: Nurse Practitioner

## 2013-08-09 ENCOUNTER — Other Ambulatory Visit: Payer: Medicare Other

## 2013-08-09 VITALS — BP 160/80 | HR 96 | Ht 72.0 in | Wt 210.8 lb

## 2013-08-09 DIAGNOSIS — I4891 Unspecified atrial fibrillation: Secondary | ICD-10-CM

## 2013-08-09 DIAGNOSIS — E785 Hyperlipidemia, unspecified: Secondary | ICD-10-CM

## 2013-08-09 DIAGNOSIS — I10 Essential (primary) hypertension: Secondary | ICD-10-CM

## 2013-08-09 DIAGNOSIS — I259 Chronic ischemic heart disease, unspecified: Secondary | ICD-10-CM | POA: Diagnosis not present

## 2013-08-09 LAB — CBC WITH DIFFERENTIAL/PLATELET
Basophils Absolute: 0 10*3/uL (ref 0.0–0.1)
Basophils Relative: 0.5 % (ref 0.0–3.0)
Eosinophils Absolute: 0.1 10*3/uL (ref 0.0–0.7)
Eosinophils Relative: 3.9 % (ref 0.0–5.0)
HCT: 38.7 % — ABNORMAL LOW (ref 39.0–52.0)
Hemoglobin: 13.3 g/dL (ref 13.0–17.0)
Lymphocytes Relative: 23.6 % (ref 12.0–46.0)
Lymphs Abs: 0.9 10*3/uL (ref 0.7–4.0)
MCHC: 34.4 g/dL (ref 30.0–36.0)
MCV: 96.3 fl (ref 78.0–100.0)
Monocytes Absolute: 0.3 10*3/uL (ref 0.1–1.0)
Monocytes Relative: 8.3 % (ref 3.0–12.0)
Neutro Abs: 2.5 10*3/uL (ref 1.4–7.7)
Neutrophils Relative %: 63.7 % (ref 43.0–77.0)
Platelets: 136 10*3/uL — ABNORMAL LOW (ref 150.0–400.0)
RBC: 4.01 Mil/uL — ABNORMAL LOW (ref 4.22–5.81)
RDW: 14 % (ref 11.5–14.6)
WBC: 3.9 10*3/uL — ABNORMAL LOW (ref 4.5–10.5)

## 2013-08-09 LAB — BASIC METABOLIC PANEL
BUN: 14 mg/dL (ref 6–23)
CO2: 27 mEq/L (ref 19–32)
Calcium: 8.6 mg/dL (ref 8.4–10.5)
Chloride: 103 mEq/L (ref 96–112)
Creatinine, Ser: 1.1 mg/dL (ref 0.4–1.5)
GFR: 69.67 mL/min (ref >60.00)
Glucose, Bld: 151 mg/dL — ABNORMAL HIGH (ref 70–99)
Potassium: 3.8 mEq/L (ref 3.5–5.1)
Sodium: 136 mEq/L (ref 135–145)

## 2013-08-09 LAB — LIPID PANEL
Cholesterol: 115 mg/dL (ref 0–200)
HDL: 44.7 mg/dL (ref >39.00)
LDL Cholesterol: 58 mg/dL (ref 0–99)
Total CHOL/HDL Ratio: 3
Triglycerides: 64 mg/dL (ref 0.0–149.0)
VLDL: 12.8 mg/dL (ref 0.0–40.0)

## 2013-08-09 LAB — HEPATIC FUNCTION PANEL
ALT: 32 U/L (ref 0–53)
AST: 25 U/L (ref 0–37)
Albumin: 4 g/dL (ref 3.5–5.2)
Alkaline Phosphatase: 66 U/L (ref 39–117)
Bilirubin, Direct: 0.1 mg/dL (ref 0.0–0.3)
Total Bilirubin: 0.9 mg/dL (ref 0.3–1.2)
Total Protein: 6.8 g/dL (ref 6.0–8.3)

## 2013-08-09 LAB — TSH: TSH: 3.37 u[IU]/mL (ref 0.35–5.50)

## 2013-08-09 MED ORDER — METOPROLOL SUCCINATE ER 100 MG PO TB24: 100.0000 mg | ORAL_TABLET | Freq: Every day | ORAL | Status: DC

## 2013-08-09 NOTE — Progress Notes (Signed)
Steve Bailey Date of Birth: 03/27/1934 Medical Record #[id number]  History of Present Illness: Steve Bailey is seen back today for a 6 month check. Seen for Dr. Elease Bailey. He is a former patient of Dr. Ronnald Bailey. He has known CAD with remote PCI and subsequent CABG. Last cath in 2010. Managed medically. Other issues include DM, HTN, HLD and past tobacco abuse. He does have chronic atrial fib and has been managed with rate control and anticoagulation with Xarelto. He has also had prostate cancer and has had radiation.   Seen here back in March of 2014 - seemed to be doing ok. Remained in atrial fib.   Comes back today. Here alone. Doing well. Still doing some farming. Not checking his BP at home. No complaints of palpitations. Not dizzy or lightheaded. No angina. Does have some upper left chest pain that sounds more musculoskeletal - Tylenol makes it go away. He does not feel like it is his heart pain. Needs labs today. Not sure if he is on his Toprol - some mixup at the drug store. He remains quite talkative.   Current Outpatient Prescriptions  Medication Sig Dispense Refill  . alfuzosin (UROXATRAL) 10 MG 24 hr tablet Take 10 mg by mouth daily.      Marland Kitchen atorvastatin (LIPITOR) 40 MG tablet TAKE 1 TABLET EVERY DAY  90 tablet  3  . Cinnamon 500 MG capsule Take 500 mg by mouth 2 (two) times daily.      Marland Kitchen ezetimibe (ZETIA) 10 MG tablet Take 1 tablet (10 mg total) by mouth daily.  90 tablet  3  . finasteride (PROSCAR) 5 MG tablet Take 5 mg by mouth daily.      Marland Kitchen glucose blood (ACCU-CHEK ACTIVE STRIPS) test strip Use as instructed  100 each  0  . Insulin Isophane & Regular (HUMULIN 70/30 KWIKPEN) (70-30) 100 UNIT/ML SUPN Inject 18 Units into the skin every morning. and 22 units at supper daily  5 pen  5  . Insulin Isophane & Regular (HUMULIN 70/30 [place]) Inject 20 Units into the skin 2 (two) times daily.       . insulin NPH-regular (NOVOLIN 70/30) (70-30) 100 UNIT/ML injection INJECT 20 UNITS INTO SKIN 2 (TWO)  TIMES A DAY  10 mL  3  . levothyroxine (SYNTHROID, LEVOTHROID) 25 MCG tablet Take 1 tablet (25 mcg total) by mouth daily.  30 tablet  11  . lisinopril (PRINIVIL,ZESTRIL) 20 MG tablet Take 1 tablet (20 mg total) by mouth 2 (two) times daily.  180 tablet  3  . magnesium hydroxide (MILK OF MAGNESIA) 400 MG/5ML suspension Take 30 mLs by mouth daily as needed. constipation      . metoprolol succinate (TOPROL-XL) 50 MG 24 hr tablet Take 1 tablet (50 mg total) by mouth daily. Take with or immediately following a meal.  90 tablet  0  . OVER THE COUNTER MEDICATION Take 1 tablet by mouth daily. Mega Red Capsules      . Rivaroxaban (XARELTO) 20 MG TABS Take 1 tablet (20 mg total) by mouth daily with supper.  30 tablet  6  . XARELTO 20 MG TABS TAKE 1 TABLET (20 MG TOTAL) BY MOUTH DAILY WITH SUPPER.  30 tablet  6   No current facility-administered medications for this visit.    Allergies  Allergen Reactions  . Cephalexin Nausea And Vomiting    Past Medical History  Diagnosis Date  . DM (diabetes mellitus)   . Hyperlipidemia   . Tobacco abuse   .  CAD (coronary artery disease)     remote CABG in 1997; prior PCI to the LAD i n1990; Nuclear study in February of 2010 with an EF of 60% with repeat cath showing distal LAD disease. Managed medically  . 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 Atrial fibrillation dx 10/10/12    started on med 10/10/12  . Hypertension dx 10/10/12  . Thyroid disease dx 10/10/12    started on Synthroid  . History of radiation therapy 11/24/12-01/18/13    Prostate 78Gy/56fx    Past Surgical History  Procedure Laterality Date  . Coronary artery bypass graft  11-1995    shows distal LAD disease, without recurrent symptoms of angina with questionable mild apical ischemia but with normal EF   . Angioplasty      of his left anterior descending previously in 1990.  . Cardiovascular stress test  12-2008    showed EF 60%  . Cataract extraction  2010    bilateral   . Knee surgery      left, arthroscopic  . Carpal tunnel release      right hand  . Esophagogastroduodenoscopy (egd) with esophageal dilation  05/2012    History  Smoking status  . Former Smoker  . Quit date: 10/14/1979  Smokeless tobacco  . Former Neurosurgeon  . Types: Chew  . Quit date: 11/23/1978    History  Alcohol Use No    Family History  Problem Relation Age of Onset  . Stroke Father   . Heart attack Mother   . Cancer Sister     lung  . Cancer Brother     prostate  . Diabetes Brother   . Stroke Brother   . Stroke Brother   . Heart disease Brother   . Heart disease Brother   . Heart attack Sister   . Heart disease Sister   . Heart attack Sister   . Cancer Sister     breast  . Heart disease Sister   . Heart attack Sister   . Heart disease Sister   . Heart disease Sister     Review of Systems: The review of systems is per the HPI.  All other systems were reviewed and are negative.  Physical Exam: BP 160/80  Pulse 96  Ht 6' (1.829 m)  Wt 210 lb 12.8 oz (95.618 kg)  BMI 28.58 kg/m2 Patient is very pleasant and in no acute distress. Skin is warm and dry. Color is normal.  HEENT is unremarkable. Normocephalic/atraumatic. PERRL. Sclera are nonicteric. Neck is supple. No masses. No JVD. Lungs are clear. Cardiac exam shows a regular rate and rhythm. Abdomen is soft. Extremities are without edema. Gait and ROM are intact. No gross neurologic deficits noted.  LABORATORY DATA: PENDING  EKG today shows sinus rhythm with PACs - rate of 99.   Lab Results  Component Value Date   WBC 5.1 05/25/2013   HGB 12.9* 05/25/2013   HCT 37.1* 05/25/2013   PLT 133.0* 05/25/2013   GLUCOSE 241* 05/25/2013   CHOL 94 06/15/2012   TRIG 58.0 06/15/2012   HDL 41.60 06/15/2012   LDLCALC 41 06/15/2012   ALT 30 05/25/2013   AST 29 05/25/2013   NA 136 05/25/2013   K 4.8 05/25/2013   CL 103 05/25/2013   CREATININE 1.1 05/25/2013   BUN 19 05/25/2013   CO2 26 05/25/2013   TSH 4.38 11/28/2012   HGBA1C 6.3  05/25/2013   MICROALBUR 6.8* 05/25/2013  Assessment / Plan: 1. Chronic atrial fib - but in sinus today - he was never cardioverted - I suspect he is going in and out - will keep him on his Xarelto due to increase stroke risk - I did increase his Toprol to 100 mg a day - hopefully this will help his BP and bring his rate down some.   2. CAD - no exertional symptoms.   3. HTN - Toprol increased today.  4. HLD - checking labs today  5. DM - sees Dr. Lucianne Muss next month.   Patient is agreeable to this plan and will call if any problems develop in the interim.   Rosalio Macadamia, RN, ANP-C [facility] [address] [place], [place]  [address]

## 2013-08-09 NOTE — Patient Instructions (Addendum)
We will check labs today  Stay on your current medicines but I am increasing your Toprol to 100 mg a day - this is at the [facility] in [place]  I will see you in 3 months  Call the [facility] office at [phone] if you have any questions, problems or concerns.

## 2013-08-10 ENCOUNTER — Encounter: Payer: Self-pay | Admitting: Cardiology

## 2013-08-11 DIAGNOSIS — E11359 Type 2 diabetes mellitus with proliferative diabetic retinopathy without macular edema: Secondary | ICD-10-CM | POA: Diagnosis not present

## 2013-08-11 DIAGNOSIS — E1139 Type 2 diabetes mellitus with other diabetic ophthalmic complication: Secondary | ICD-10-CM | POA: Diagnosis not present

## 2013-08-16 ENCOUNTER — Other Ambulatory Visit: Payer: Self-pay | Admitting: Cardiovascular Disease

## 2013-08-17 DIAGNOSIS — Z23 Encounter for immunization: Secondary | ICD-10-CM | POA: Diagnosis not present

## 2013-09-08 DIAGNOSIS — C61 Malignant neoplasm of prostate: Secondary | ICD-10-CM | POA: Diagnosis not present

## 2013-09-13 ENCOUNTER — Other Ambulatory Visit: Payer: Self-pay | Admitting: Cardiovascular Disease

## 2013-09-15 DIAGNOSIS — C61 Malignant neoplasm of prostate: Secondary | ICD-10-CM | POA: Diagnosis not present

## 2013-09-15 DIAGNOSIS — N400 Enlarged prostate without lower urinary tract symptoms: Secondary | ICD-10-CM | POA: Diagnosis not present

## 2013-09-15 DIAGNOSIS — R351 Nocturia: Secondary | ICD-10-CM | POA: Diagnosis not present

## 2013-09-29 ENCOUNTER — Ambulatory Visit: Payer: Medicare Other | Admitting: Endocrinology

## 2013-09-29 DIAGNOSIS — Z0289 Encounter for other administrative examinations: Secondary | ICD-10-CM

## 2013-10-12 ENCOUNTER — Other Ambulatory Visit: Payer: Self-pay | Admitting: Nurse Practitioner

## 2013-11-02 ENCOUNTER — Encounter: Payer: Self-pay | Admitting: Cardiovascular Disease

## 2013-11-02 ENCOUNTER — Ambulatory Visit: Payer: Medicare Other | Admitting: Cardiovascular Disease

## 2013-11-02 ENCOUNTER — Ambulatory Visit (INDEPENDENT_AMBULATORY_CARE_PROVIDER_SITE_OTHER): Payer: Medicare Other | Admitting: Cardiovascular Disease

## 2013-11-02 VITALS — BP 178/77 | HR 100 | Ht 72.0 in | Wt 218.0 lb

## 2013-11-02 DIAGNOSIS — I25810 Atherosclerosis of coronary artery bypass graft(s) without angina pectoris: Secondary | ICD-10-CM

## 2013-11-02 DIAGNOSIS — E039 Hypothyroidism, unspecified: Secondary | ICD-10-CM

## 2013-11-02 DIAGNOSIS — I10 Essential (primary) hypertension: Secondary | ICD-10-CM

## 2013-11-02 DIAGNOSIS — Z91199 Patient's noncompliance with other medical treatment and regimen due to unspecified reason: Secondary | ICD-10-CM

## 2013-11-02 DIAGNOSIS — Z9114 Patient's other noncompliance with medication regimen: Secondary | ICD-10-CM

## 2013-11-02 DIAGNOSIS — E785 Hyperlipidemia, unspecified: Secondary | ICD-10-CM

## 2013-11-02 DIAGNOSIS — I4891 Unspecified atrial fibrillation: Secondary | ICD-10-CM

## 2013-11-02 DIAGNOSIS — Z9119 Patient's noncompliance with other medical treatment and regimen: Secondary | ICD-10-CM

## 2013-11-02 MED ORDER — AMLODIPINE BESYLATE 5 MG PO TABS: 5.0000 mg | ORAL_TABLET | Freq: Every day | ORAL | Status: DC

## 2013-11-02 MED ORDER — ATORVASTATIN CALCIUM 40 MG PO TABS: ORAL_TABLET | ORAL | Status: DC

## 2013-11-02 MED ORDER — APIXABAN 2.5 MG PO TABS: 2.5000 mg | ORAL_TABLET | Freq: Two times a day (BID) | ORAL | Status: DC

## 2013-11-02 NOTE — Patient Instructions (Addendum)
Your physician recommends that you schedule a follow-up appointment in: 2 months  Your physician has recommended you make the following change in your medication:   STOP:  Xarelto  START:  Eliquis  2.5 mg twice a day  START: Norvasc 5 mg daily  Your physician recommends that you return for lab work in: one week for LFT'S, Lipids in 3 months

## 2013-11-02 NOTE — Progress Notes (Signed)
Patient ID: Kristi Hyer, male   DOB: 04-16-1934, 79 y.o.   MRN: [id number]           SUBJECTIVE: The patient is a 79 year old male who I am meeting for the first time today. He has a history of coronary artery disease and coronary artery bypass graft surgery, with his most recent cardiac catheterization performed in 2010. He also has hypertension, hyperlipidemia, IDDM, and atrial fibrillation. He is very noncompliant with his medication regimen. The nursing room him elicited that he is taking Xarelto every other day due to headaches. He has not filled his Lipitor or Zetia since early summer. He also has not been taking his Synthroid.  He does not have a primary care physician. He denies chest pain, shortness of breath, syncope, orthopnea, leg swelling and paroxysmal nocturnal dyspnea. He has occasional palpitations. He used to be seen at our [facility] in [place] but this was too long of a drive for him.     Allergies  Allergen Reactions  . Cephalexin Nausea And Vomiting    Current Outpatient Prescriptions  Medication Sig Dispense Refill  . alfuzosin (UROXATRAL) 10 MG 24 hr tablet Take 10 mg by mouth daily.      . Cinnamon 500 MG capsule Take 500 mg by mouth 2 (two) times daily.      . finasteride (PROSCAR) 5 MG tablet Take 5 mg by mouth daily.      Marland Kitchen glucose blood (ACCU-CHEK ACTIVE STRIPS) test strip Use as instructed  100 each  0  . Insulin Isophane & Regular (HUMULIN 70/30 [place]) (70-30) 100 UNIT/ML SUPN Inject 18 Units into the skin every morning. and 22 units at supper daily  5 pen  5  . Insulin Isophane & Regular (HUMULIN 70/30 [place]) Inject 20 Units into the skin 2 (two) times daily.       . insulin NPH-regular (NOVOLIN 70/30) (70-30) 100 UNIT/ML injection INJECT 20 UNITS INTO SKIN 2 (TWO) TIMES A DAY  10 mL  3  . lisinopril (PRINIVIL,ZESTRIL) 20 MG tablet TAKE 1 TABLET (20 MG TOTAL) BY MOUTH 2 (TWO) TIMES DAILY.  180 tablet  0  . magnesium hydroxide (MILK OF  MAGNESIA) 400 MG/5ML suspension Take 30 mLs by mouth daily as needed. constipation      . metoprolol succinate (TOPROL-XL) 100 MG 24 hr tablet Take 1 tablet (100 mg total) by mouth daily. Take with or immediately following a meal.  90 tablet  3  . OVER THE COUNTER MEDICATION Take 1 tablet by mouth daily. Mega Red Capsules      . atorvastatin (LIPITOR) 40 MG tablet TAKE 1 TABLET EVERY DAY  90 tablet  3  . ezetimibe (ZETIA) 10 MG tablet Take 1 tablet (10 mg total) by mouth daily.  90 tablet  3  . levothyroxine (SYNTHROID, LEVOTHROID) 25 MCG tablet Take 1 tablet (25 mcg total) by mouth daily.  30 tablet  11  . XARELTO 20 MG TABS TAKE 1 TABLET (20 MG TOTAL) BY MOUTH DAILY WITH SUPPER.  30 tablet  6   No current facility-administered medications for this visit.    Past Medical History  Diagnosis Date  . DM (diabetes mellitus)   . Hyperlipidemia   . Tobacco abuse   . CAD (coronary artery disease)     remote CABG in 1997; prior PCI to the LAD i n1990; Nuclear study in February of 2010 with an EF of 60% with repeat cath showing distal LAD disease. Managed medically  .  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 Atrial fibrillation dx 10/10/12    started on med 10/10/12  . Hypertension dx 10/10/12  . Thyroid disease dx 10/10/12    started on Synthroid  . History of radiation therapy 11/24/12-01/18/13    Prostate 78Gy/43fx    Past Surgical History  Procedure Laterality Date  . Coronary artery bypass graft  11-1995    shows distal LAD disease, without recurrent symptoms of angina with questionable mild apical ischemia but with normal EF   . Angioplasty      of his left anterior descending previously in 1990.  . Cardiovascular stress test  12-2008    showed EF 60%  . Cataract extraction  2010    bilateral  . Knee surgery      left, arthroscopic  . Carpal tunnel release      right hand  . Esophagogastroduodenoscopy (egd) with esophageal dilation  05/2012    History   Social  History  . Marital Status: Married    Spouse Name: N/A    Number of Children: N/A  . Years of Education: N/A   Occupational History  . Not on file.   Social History Main Topics  . Smoking status: Former Smoker    Quit date: 10/14/1979  . Smokeless tobacco: Former Neurosurgeon    Types: Chew    Quit date: 11/23/1978  . Alcohol Use: No  . Drug Use: No  . Sexual Activity: No   Other Topics Concern  . Not on file   Social History Narrative  . No narrative on file     Filed Vitals:   11/02/13 0916  BP: 178/77  Pulse: 100  Height: 6' (1.829 m)  Weight: 218 lb (98.884 kg)   Repeat vitals: HR 64 bpm, BP 205/89  PHYSICAL EXAM General: NAD Neck: No JVD, no thyromegaly or thyroid nodule.  Lungs: Clear to auscultation bilaterally with normal respiratory effort. CV: Nondisplaced PMI.  Heart regular, HR 62 bpm, normal S1/S2, no S3/S4, no murmur.  No peripheral edema.  No carotid bruit.  Normal pedal pulses.  Abdomen: Soft, nontender, no hepatosplenomegaly, no distention.  Neurologic: Alert and oriented x 3.  Psych: Normal affect. Extremities: No clubbing or cyanosis.   ECG: reviewed and available in electronic records (NSR with PAC's on 08/09/2013).      ASSESSMENT AND PLAN: 1. CAD s/p CABG: symptomatically stable. Will refill Lipitor 40 mg daily and check LFT's in four weeks and repeat a lipid panel in 3 months. Continue metoprolol at current dose. As pt is noncompliant with medications, I will only have him take an anticoagulant rather than having him take ASA too. 2. HTN: uncontrolled. Will add amlodipine 5 mg daily. 3. Hyperlipidemia: last lipids in 07/2013 were within normal limits (TC 115, TG 64, HDL 45, LDL 58). As he has not been taking Lipitor, I will refill a 90-day supply of 40 mg daily, and repeat LFT's in 4 weeks and lipids in 3 months. 4. Atrial fibrillation: currently in a regular rhythm. As he has been having headaches with Xarelto, I will switch initially to apixaban  2.5 mg bid. I would consider increasing this to 5 mg bid at his next visit. 5. Hypothyroidism: he has not been taking Synthroid. I will refill a 90-day supply of 25 mcg daily.   Prentice Docker, M.D., F.A.C.C.

## 2013-11-08 ENCOUNTER — Ambulatory Visit: Payer: Medicare Other | Admitting: Nurse Practitioner

## 2013-11-27 ENCOUNTER — Other Ambulatory Visit: Payer: Self-pay | Admitting: Cardiovascular Disease

## 2013-12-19 DIAGNOSIS — N400 Enlarged prostate without lower urinary tract symptoms: Secondary | ICD-10-CM | POA: Diagnosis not present

## 2013-12-19 DIAGNOSIS — R351 Nocturia: Secondary | ICD-10-CM | POA: Diagnosis not present

## 2013-12-19 DIAGNOSIS — C61 Malignant neoplasm of prostate: Secondary | ICD-10-CM | POA: Diagnosis not present

## 2014-01-08 ENCOUNTER — Ambulatory Visit (INDEPENDENT_AMBULATORY_CARE_PROVIDER_SITE_OTHER): Payer: Medicare Other | Admitting: Cardiovascular Disease

## 2014-01-08 ENCOUNTER — Encounter: Payer: Self-pay | Admitting: Cardiovascular Disease

## 2014-01-08 VITALS — BP 177/78 | HR 60 | Ht 72.0 in | Wt 224.0 lb

## 2014-01-08 DIAGNOSIS — I4891 Unspecified atrial fibrillation: Secondary | ICD-10-CM

## 2014-01-08 DIAGNOSIS — I10 Essential (primary) hypertension: Secondary | ICD-10-CM

## 2014-01-08 DIAGNOSIS — E785 Hyperlipidemia, unspecified: Secondary | ICD-10-CM

## 2014-01-08 DIAGNOSIS — I25810 Atherosclerosis of coronary artery bypass graft(s) without angina pectoris: Secondary | ICD-10-CM

## 2014-01-08 DIAGNOSIS — Z9119 Patient's noncompliance with other medical treatment and regimen: Secondary | ICD-10-CM

## 2014-01-08 DIAGNOSIS — Z91148 Patient's other noncompliance with medication regimen for other reason: Secondary | ICD-10-CM | POA: Insufficient documentation

## 2014-01-08 DIAGNOSIS — E039 Hypothyroidism, unspecified: Secondary | ICD-10-CM

## 2014-01-08 DIAGNOSIS — Z91199 Patient's noncompliance with other medical treatment and regimen due to unspecified reason: Secondary | ICD-10-CM | POA: Diagnosis not present

## 2014-01-08 DIAGNOSIS — E782 Mixed hyperlipidemia: Secondary | ICD-10-CM

## 2014-01-08 DIAGNOSIS — Z9114 Patient's other noncompliance with medication regimen: Secondary | ICD-10-CM

## 2014-01-08 MED ORDER — ATORVASTATIN CALCIUM 40 MG PO TABS: ORAL_TABLET | ORAL | Status: DC

## 2014-01-08 MED ORDER — AMLODIPINE BESYLATE 5 MG PO TABS: 5.0000 mg | ORAL_TABLET | Freq: Every day | ORAL | Status: DC

## 2014-01-08 MED ORDER — METOPROLOL SUCCINATE ER 100 MG PO TB24: 100.0000 mg | ORAL_TABLET | Freq: Every day | ORAL | Status: DC

## 2014-01-08 MED ORDER — NITROGLYCERIN 0.4 MG SL SUBL: 0.4000 mg | SUBLINGUAL_TABLET | SUBLINGUAL | Status: DC | PRN

## 2014-01-08 MED ORDER — APIXABAN 2.5 MG PO TABS: 2.5000 mg | ORAL_TABLET | Freq: Two times a day (BID) | ORAL | Status: DC

## 2014-01-08 MED ORDER — LISINOPRIL 20 MG PO TABS: 20.0000 mg | ORAL_TABLET | Freq: Every day | ORAL | Status: DC

## 2014-01-08 NOTE — Patient Instructions (Addendum)
Your physician recommends that you schedule a follow-up appointment in: 3 months  Your physician has recommended you make the following change in your medication:   1) START TAKING NITRO AS NEEDED The proper use and anticipated side effects of nitroglycerine has been carefully explained.  If a single episode of chest pain is not relieved by one tablet, the patient will try another within 5 minutes; and if this doesn't relieve the pain, the patient is instructed to call 911 for transportation to an emergency department. 2) RE-START YOUR ELIQUIS 2.5MG  TWICE DAILY, SAMPLES PROVIDED TODAY 3) RE-START YOUR AMLODIPINE 5MG  ONCE DAILY  Your physician recommends that you return for lab work in: [place] TSH,LFTS, 3 MONTHS FOR YOUR FASTING LIPID PANEL, DATES WRITTEN ON YOUR LAB SLIPS  WE WILL CALL YOU WITH YOUR TEST RESULTS/INSTRUCTIONS/NEXT STEPS ONCE RECEIVED BY THE PROVIDER   Your physician recommends THAT YOU ESTABLISH CARE WITH A PRIMARY CARE PHYSICIAN FROM THE LIST PROVIDED AT [facility]

## 2014-01-08 NOTE — Progress Notes (Signed)
Patient ID: Asaad Gulley, male   DOB: 07-26-1934, 80 y.o.   MRN: [id number]      SUBJECTIVE: The patient is a 80 year old male who has a history of coronary artery disease and coronary artery bypass graft surgery, with his most recent cardiac catheterization performed in 2010. He also has hypertension, hyperlipidemia, IDDM, and atrial fibrillation. He is very noncompliant with his medication regimen. Since his last visit, he has not been taking Synthroid and Eliquis. He ran out of amlodipine.  He does not have a primary care physician. He denies shortness of breath, syncope, orthopnea, leg swelling and paroxysmal nocturnal dyspnea. He has occasional palpitations. He had chest pain last night, relieved with two baby aspirin.     Allergies  Allergen Reactions  . Cephalexin Nausea And Vomiting    Current Outpatient Prescriptions  Medication Sig Dispense Refill  . alfuzosin (UROXATRAL) 10 MG 24 hr tablet Take 10 mg by mouth daily.      Marland Kitchen atorvastatin (LIPITOR) 40 MG tablet TAKE 1 TABLET EVERY DAY  90 tablet  3  . Cinnamon 500 MG capsule Take 500 mg by mouth 2 (two) times daily.      Marland Kitchen ezetimibe (ZETIA) 10 MG tablet Take 1 tablet (10 mg total) by mouth daily.  90 tablet  3  . finasteride (PROSCAR) 5 MG tablet Take 5 mg by mouth daily.      Marland Kitchen glucose blood (ACCU-CHEK ACTIVE STRIPS) test strip Use as instructed  100 each  0  . Insulin Isophane & Regular (HUMULIN 70/30 KWIKPEN) (70-30) 100 UNIT/ML SUPN Inject 18 Units into the skin every morning. and 22 units at supper daily  5 pen  5  . Insulin Isophane & Regular (HUMULIN 70/30 [place]) Inject 20 Units into the skin 2 (two) times daily.       . insulin NPH-regular (NOVOLIN 70/30) (70-30) 100 UNIT/ML injection INJECT 20 UNITS INTO SKIN 2 (TWO) TIMES A DAY  10 mL  3  . levothyroxine (SYNTHROID, LEVOTHROID) 25 MCG tablet Take 1 tablet (25 mcg total) by mouth daily.  30 tablet  11  . lisinopril (PRINIVIL,ZESTRIL) 20 MG tablet TAKE 1 TABLET (20 MG TOTAL)  BY MOUTH 2 (TWO) TIMES DAILY.  180 tablet  0  . OVER THE COUNTER MEDICATION Take 1 tablet by mouth daily. Mega Red Capsules      . amLODipine (NORVASC) 5 MG tablet Take 1 tablet (5 mg total) by mouth daily.  90 tablet  4  . apixaban (ELIQUIS) 2.5 MG TABS tablet Take 1 tablet (2.5 mg total) by mouth 2 (two) times daily.  90 tablet  3  . metoprolol succinate (TOPROL-XL) 100 MG 24 hr tablet Take 1 tablet (100 mg total) by mouth daily. Take with or immediately following a meal.  90 tablet  3   No current facility-administered medications for this visit.    Past Medical History  Diagnosis Date  . DM (diabetes mellitus)   . Hyperlipidemia   . Tobacco abuse   . CAD (coronary artery disease)     remote CABG in 1997; prior PCI to the LAD i n1990; Nuclear study in February of 2010 with an EF of 60% with repeat cath showing distal LAD disease. Managed medically  . 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 Atrial fibrillation dx 10/10/12    started on med 10/10/12  . Hypertension dx 10/10/12  . Thyroid disease dx 10/10/12    started on Synthroid  . History  of radiation therapy 11/24/12-01/18/13    Prostate 78Gy/53fx    Past Surgical History  Procedure Laterality Date  . Coronary artery bypass graft  11-1995    shows distal LAD disease, without recurrent symptoms of angina with questionable mild apical ischemia but with normal EF   . Angioplasty      of his left anterior descending previously in 1990.  . Cardiovascular stress test  12-2008    showed EF 60%  . Cataract extraction  2010    bilateral  . Knee surgery      left, arthroscopic  . Carpal tunnel release      right hand  . Esophagogastroduodenoscopy (egd) with esophageal dilation  05/2012    History   Social History  . Marital Status: Married    Spouse Name: N/A    Number of Children: N/A  . Years of Education: N/A   Occupational History  . Not on file.   Social History Main Topics  . Smoking status: Former  Smoker    Quit date: 10/14/1979  . Smokeless tobacco: Former Systems developer    Types: [place] date: 11/23/1978  . Alcohol Use: No  . Drug Use: No  . Sexual Activity: No   Other Topics Concern  . Not on file   Social History Narrative  . No narrative on file     Filed Vitals:   01/08/14 0852  BP: 177/78  Pulse: 60  Height: 6' (1.829 m)  Weight: 224 lb (101.606 kg)    PHYSICAL EXAM General: NAD Neck: No JVD, no thyromegaly or thyroid nodule.  Lungs: Clear to auscultation bilaterally with normal respiratory effort. CV: Nondisplaced PMI.  Heart regular S1/S2, no S3/S4, no murmur.  No peripheral edema.  No carotid bruit.  Normal pedal pulses.  Abdomen: Soft, nontender, no hepatosplenomegaly, no distention.  Neurologic: Alert and oriented x 3.  Psych: Normal affect. Extremities: No clubbing or cyanosis.   ECG: reviewed and available in electronic records.      ASSESSMENT AND PLAN: 1. CAD s/p CABG: symptomatically stable, with only one episode of chest pain in several months. Will refill sublingual nitroglycerin. At his last visit, I refilled Lipitor 40 mg daily and planned to check LFT's with a repeat lipid panel in 2 months. Continue metoprolol at current dose. As pt is noncompliant with medications, I will only have him take an anticoagulant rather than having him take ASA too.  2. HTN: uncontrolled, but he ran out of amlodipine 5 mg daily. Will refill. 3. Hyperlipidemia: last lipids in 07/2013 were within normal limits (TC 115, TG 64, HDL 45, LDL 58). Will repeat LFT's now and lipids in 3 months.  4. Atrial fibrillation: currently in a regular rhythm. We are providing samples of apixaban 2.5 mg bid as well as a discount card. I would consider increasing this to 5 mg bid at his next visit if he demonstrates compliance.  5. Hypothyroidism: he has not been taking Synthroid. I refilled a 90-day supply of 25 mcg daily at his last visit. Will check a TSH.  Dispo: f/u 3  months.  Kate Sable, M.D., F.A.C.C.

## 2014-01-29 DIAGNOSIS — I10 Essential (primary) hypertension: Secondary | ICD-10-CM | POA: Diagnosis not present

## 2014-01-29 DIAGNOSIS — I4891 Unspecified atrial fibrillation: Secondary | ICD-10-CM | POA: Diagnosis not present

## 2014-01-29 DIAGNOSIS — E785 Hyperlipidemia, unspecified: Secondary | ICD-10-CM | POA: Diagnosis not present

## 2014-01-29 DIAGNOSIS — Z91199 Patient's noncompliance with other medical treatment and regimen due to unspecified reason: Secondary | ICD-10-CM | POA: Diagnosis not present

## 2014-01-29 DIAGNOSIS — I259 Chronic ischemic heart disease, unspecified: Secondary | ICD-10-CM | POA: Diagnosis not present

## 2014-01-29 DIAGNOSIS — Z9119 Patient's noncompliance with other medical treatment and regimen: Secondary | ICD-10-CM | POA: Diagnosis not present

## 2014-01-29 LAB — HEPATIC FUNCTION PANEL
ALT: 15 U/L (ref 0–53)
AST: 14 U/L (ref 0–37)
Albumin: 4 g/dL (ref 3.5–5.2)
Alkaline Phosphatase: 90 U/L (ref 39–117)
Bilirubin, Direct: 0.2 mg/dL (ref 0.0–0.3)
Indirect Bilirubin: 0.6 mg/dL (ref 0.2–1.2)
Total Bilirubin: 0.8 mg/dL (ref 0.2–1.2)
Total Protein: 6.4 g/dL (ref 6.0–8.3)

## 2014-01-30 LAB — TSH: TSH: 4.876 u[IU]/mL — ABNORMAL HIGH (ref 0.350–4.500)

## 2014-02-02 ENCOUNTER — Telehealth: Payer: Self-pay

## 2014-02-02 NOTE — Telephone Encounter (Signed)
Message copied by Bernita Raisin on Fri Feb 02, 2014 11:17 AM ------      Message from: Kate Sable A      Created: Fri Feb 02, 2014 10:11 AM       Ok. ------

## 2014-02-02 NOTE — Telephone Encounter (Signed)
Pt notified of MD's comments regarding lab results,no pcp to forward to

## 2014-03-08 DIAGNOSIS — N400 Enlarged prostate without lower urinary tract symptoms: Secondary | ICD-10-CM | POA: Diagnosis not present

## 2014-03-08 DIAGNOSIS — C61 Malignant neoplasm of prostate: Secondary | ICD-10-CM | POA: Diagnosis not present

## 2014-03-08 DIAGNOSIS — R351 Nocturia: Secondary | ICD-10-CM | POA: Diagnosis not present

## 2014-03-16 DIAGNOSIS — N400 Enlarged prostate without lower urinary tract symptoms: Secondary | ICD-10-CM | POA: Diagnosis not present

## 2014-03-16 DIAGNOSIS — R351 Nocturia: Secondary | ICD-10-CM | POA: Diagnosis not present

## 2014-03-16 DIAGNOSIS — C61 Malignant neoplasm of prostate: Secondary | ICD-10-CM | POA: Diagnosis not present

## 2014-04-13 ENCOUNTER — Ambulatory Visit: Payer: Medicare Other | Admitting: Cardiovascular Disease

## 2014-04-20 DIAGNOSIS — E1139 Type 2 diabetes mellitus with other diabetic ophthalmic complication: Secondary | ICD-10-CM | POA: Diagnosis not present

## 2014-04-20 DIAGNOSIS — E11311 Type 2 diabetes mellitus with unspecified diabetic retinopathy with macular edema: Secondary | ICD-10-CM | POA: Diagnosis not present

## 2014-04-20 DIAGNOSIS — E11359 Type 2 diabetes mellitus with proliferative diabetic retinopathy without macular edema: Secondary | ICD-10-CM | POA: Diagnosis not present

## 2014-04-20 DIAGNOSIS — H31019 Macula scars of posterior pole (postinflammatory) (post-traumatic), unspecified eye: Secondary | ICD-10-CM | POA: Diagnosis not present

## 2014-04-30 ENCOUNTER — Encounter: Payer: Self-pay | Admitting: Cardiovascular Disease

## 2014-04-30 ENCOUNTER — Ambulatory Visit (INDEPENDENT_AMBULATORY_CARE_PROVIDER_SITE_OTHER): Payer: Medicare Other | Admitting: Cardiovascular Disease

## 2014-04-30 VITALS — BP 180/100 | HR 66 | Ht 72.0 in | Wt 215.0 lb

## 2014-04-30 DIAGNOSIS — I2510 Atherosclerotic heart disease of native coronary artery without angina pectoris: Secondary | ICD-10-CM

## 2014-04-30 DIAGNOSIS — Z91199 Patient's noncompliance with other medical treatment and regimen due to unspecified reason: Secondary | ICD-10-CM

## 2014-04-30 DIAGNOSIS — E119 Type 2 diabetes mellitus without complications: Secondary | ICD-10-CM | POA: Diagnosis not present

## 2014-04-30 DIAGNOSIS — E785 Hyperlipidemia, unspecified: Secondary | ICD-10-CM | POA: Diagnosis not present

## 2014-04-30 DIAGNOSIS — Z9119 Patient's noncompliance with other medical treatment and regimen: Secondary | ICD-10-CM

## 2014-04-30 DIAGNOSIS — Z9114 Patient's other noncompliance with medication regimen: Secondary | ICD-10-CM

## 2014-04-30 DIAGNOSIS — I4891 Unspecified atrial fibrillation: Secondary | ICD-10-CM

## 2014-04-30 DIAGNOSIS — I10 Essential (primary) hypertension: Secondary | ICD-10-CM

## 2014-04-30 DIAGNOSIS — E039 Hypothyroidism, unspecified: Secondary | ICD-10-CM

## 2014-04-30 MED ORDER — ATORVASTATIN CALCIUM 40 MG PO TABS: ORAL_TABLET | ORAL | Status: DC

## 2014-04-30 MED ORDER — AMLODIPINE BESYLATE 10 MG PO TABS: 10.0000 mg | ORAL_TABLET | Freq: Every day | ORAL | Status: DC

## 2014-04-30 NOTE — Patient Instructions (Addendum)
Your physician wants you to follow-up in:  6 months You will receive a reminder letter in the mail two months in advance. If you don't receive a letter, please call our office to schedule the follow-up appointment.    Your physician has recommended you make the following change in your medication:     INCREASE Amlodipine to 10 mg daily   I have refilled your Lipitor for you   Please get your cholesterol checked,no food or drink 12 hrs before test   You have been referred to endocrinology, Dr.Gebreselassie Nida .You will receive a call from their office to schedule an appointment    Thank you for choosing [facility] !

## 2014-04-30 NOTE — Progress Notes (Signed)
Patient ID: Steve Bailey, male   DOB: 01-15-1934, 80 y.o.   MRN: [id number]      SUBJECTIVE: The patient is a 80 year old male who has a history of coronary artery disease and coronary artery bypass graft surgery, with his most recent cardiac catheterization performed in 2010. He also has hypertension, hyperlipidemia, IDDM, and atrial fibrillation. He is very noncompliant with his medication regimen.  He believes he ran out of his Lipitor and the pharmacy didn't refill it.  He claims to be taking every other medication on his list.  He does not have a primary care physician, but claims he is going to see one in July. He has not seen his diabetes doctor in over 9 months. He takes insulin. He denies chest pain, shortness of breath, syncope, orthopnea, leg swelling and paroxysmal nocturnal dyspnea.     Allergies  Allergen Reactions  . Cephalexin Nausea And Vomiting    Current Outpatient Prescriptions  Medication Sig Dispense Refill  . alfuzosin (UROXATRAL) 10 MG 24 hr tablet Take 10 mg by mouth daily.      Marland Kitchen amLODipine (NORVASC) 5 MG tablet Take 1 tablet (5 mg total) by mouth daily.  90 tablet  1  . apixaban (ELIQUIS) 2.5 MG TABS tablet Take 1 tablet (2.5 mg total) by mouth 2 (two) times daily.  180 tablet  1  . atorvastatin (LIPITOR) 40 MG tablet TAKE 1 TABLET EVERY DAY  90 tablet  1  . Cinnamon 500 MG capsule Take 500 mg by mouth 2 (two) times daily.      . finasteride (PROSCAR) 5 MG tablet Take 5 mg by mouth daily.      Marland Kitchen glucose blood (ACCU-CHEK ACTIVE STRIPS) test strip Use as instructed  100 each  0  . Insulin Isophane & Regular (HUMULIN 70/30 KWIKPEN) (70-30) 100 UNIT/ML SUPN Inject 18 Units into the skin every morning. and 22 units at supper daily  5 pen  5  . Insulin Isophane & Regular (HUMULIN 70/30 [place]) Inject 20 Units into the skin 2 (two) times daily.       . insulin NPH-regular (NOVOLIN 70/30) (70-30) 100 UNIT/ML injection INJECT 20 UNITS INTO SKIN 2 (TWO) TIMES A DAY  10 mL  3    . levothyroxine (SYNTHROID, LEVOTHROID) 25 MCG tablet Take 1 tablet (25 mcg total) by mouth daily.  30 tablet  11  . lisinopril (PRINIVIL,ZESTRIL) 20 MG tablet Take 1 tablet (20 mg total) by mouth daily.  90 tablet  1  . metoprolol succinate (TOPROL-XL) 100 MG 24 hr tablet Take 1 tablet (100 mg total) by mouth daily. Take with or immediately following a meal.  90 tablet  1  . nitroGLYCERIN (NITROSTAT) 0.4 MG SL tablet Place 1 tablet (0.4 mg total) under the tongue every 5 (five) minutes as needed for chest pain.  25 tablet  6  . OVER THE COUNTER MEDICATION Take 1 tablet by mouth daily. Mega Red Capsules       No current facility-administered medications for this visit.    Past Medical History  Diagnosis Date  . DM (diabetes mellitus)   . Hyperlipidemia   . Tobacco abuse   . CAD (coronary artery disease)     remote CABG in 1997; prior PCI to the LAD i n1990; Nuclear study in February of 2010 with an EF of 60% with repeat cath showing distal LAD disease. Managed medically  . 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 Atrial fibrillation  dx 10/10/12    started on med 10/10/12  . Hypertension dx 10/10/12  . Thyroid disease dx 10/10/12    started on Synthroid  . History of radiation therapy 11/24/12-01/18/13    Prostate 78Gy/27fx    Past Surgical History  Procedure Laterality Date  . Coronary artery bypass graft  11-1995    shows distal LAD disease, without recurrent symptoms of angina with questionable mild apical ischemia but with normal EF   . Angioplasty      of his left anterior descending previously in 1990.  . Cardiovascular stress test  12-2008    showed EF 60%  . Cataract extraction  2010    bilateral  . Knee surgery      left, arthroscopic  . Carpal tunnel release      right hand  . Esophagogastroduodenoscopy (egd) with esophageal dilation  05/2012    History   Social History  . Marital Status: Married    Spouse Name: N/A    Number of Children: N/A  .  Years of Education: N/A   Occupational History  . Not on file.   Social History Main Topics  . Smoking status: Former Smoker    Quit date: 10/14/1979  . Smokeless tobacco: Former Systems developer    Types: [place] date: 11/23/1978  . Alcohol Use: No  . Drug Use: No  . Sexual Activity: No   Other Topics Concern  . Not on file   Social History Narrative  . No narrative on file     Filed Vitals:   04/30/14 0940  BP: 180/100  Pulse: 66  Height: 6' (1.829 m)  Weight: 215 lb (97.523 kg)  SpO2: 99%    PHYSICAL EXAM General: NAD Neck: No JVD, no thyromegaly. Lungs: Clear to auscultation bilaterally with normal respiratory effort. CV: Nondisplaced PMI.  Regular rate and rhythm, normal S1/S2, no S3/S4, no murmur. No pretibial or periankle edema.  No carotid bruit.  Normal pedal pulses.  Abdomen: Soft, nontender, no hepatosplenomegaly, no distention.  Neurologic: Alert and oriented x 3.  Psych: Normal affect. Extremities: No clubbing or cyanosis.   ECG: reviewed and available in electronic records.      ASSESSMENT AND PLAN: 1. CAD s/p CABG: Symptomatically stable. Will refill Lipitor and check lipids. Otherwise, continue metoprolol. As he is on Eliquis, I am avoiding ASA. 2. HTN: Uncontrolled, so will increase amlodipine to 10 mg daily.  3. Hyperlipidemia: Last lipids in 07/2013 were within normal limits (TC 115, TG 64, HDL 45, LDL 58). LFT's normal in 01/2014. Check lipids.  4. Atrial fibrillation: Currently in a regular rhythm. We are providing samples of apixaban 2.5 mg bid as well as a discount card. I would consider increasing this to 5 mg bid at his next visit if he demonstrates compliance.  5. Hypothyroidism: TSH 4.8 in 01/2014.  6. Diabetes mellitus: I will make a referral to Dr. Dorris Fetch.  Dispo: f/u 6 months.  Kate Sable, M.D., F.A.C.C.

## 2014-05-04 DIAGNOSIS — E785 Hyperlipidemia, unspecified: Secondary | ICD-10-CM | POA: Diagnosis not present

## 2014-05-05 LAB — LIPID PANEL
Cholesterol: 133 mg/dL (ref 0–200)
HDL: 36 mg/dL — ABNORMAL LOW (ref >39)
LDL Cholesterol: 71 mg/dL (ref 0–99)
Total CHOL/HDL Ratio: 3.7 Ratio
Triglycerides: 131 mg/dL (ref <150)
VLDL: 26 mg/dL (ref 0–40)

## 2014-05-07 ENCOUNTER — Encounter: Payer: Self-pay | Admitting: *Deleted

## 2014-06-15 DIAGNOSIS — R351 Nocturia: Secondary | ICD-10-CM | POA: Diagnosis not present

## 2014-06-15 DIAGNOSIS — C61 Malignant neoplasm of prostate: Secondary | ICD-10-CM | POA: Diagnosis not present

## 2014-06-15 DIAGNOSIS — N400 Enlarged prostate without lower urinary tract symptoms: Secondary | ICD-10-CM | POA: Diagnosis not present

## 2014-07-11 ENCOUNTER — Telehealth: Payer: Self-pay | Admitting: *Deleted

## 2014-07-11 MED ORDER — LISINOPRIL 20 MG PO TABS: 20.0000 mg | ORAL_TABLET | Freq: Every day | ORAL | Status: DC

## 2014-07-11 NOTE — Telephone Encounter (Signed)
[facility]. States they sent it into [place] a couple days ago/tmj

## 2014-07-11 NOTE — Telephone Encounter (Signed)
Medication sent to pharmacy  

## 2014-08-03 DIAGNOSIS — Z23 Encounter for immunization: Secondary | ICD-10-CM | POA: Diagnosis not present

## 2014-08-08 ENCOUNTER — Telehealth: Payer: Self-pay | Admitting: Cardiovascular Disease

## 2014-08-08 MED ORDER — AMLODIPINE BESYLATE 10 MG PO TABS: 10.0000 mg | ORAL_TABLET | Freq: Every day | ORAL | Status: DC

## 2014-08-08 NOTE — Telephone Encounter (Signed)
Received fax refill request  Rx # [id number] Medication:  Amlodopine Besylate 5 mg tab Qty 90 Sig:  Take one tablet by mouth every day  Physician:  Bronson Ing

## 2014-08-08 NOTE — Telephone Encounter (Signed)
Medication sent to pharmacy  

## 2014-08-09 ENCOUNTER — Other Ambulatory Visit: Payer: Self-pay | Admitting: Endocrinology

## 2014-08-12 ENCOUNTER — Other Ambulatory Visit: Payer: Self-pay | Admitting: Endocrinology

## 2014-08-17 ENCOUNTER — Telehealth: Payer: Self-pay | Admitting: Endocrinology

## 2014-08-17 ENCOUNTER — Encounter: Payer: Self-pay | Admitting: Endocrinology

## 2014-08-17 ENCOUNTER — Ambulatory Visit (INDEPENDENT_AMBULATORY_CARE_PROVIDER_SITE_OTHER): Payer: Medicare Other | Admitting: Endocrinology

## 2014-08-17 VITALS — BP 136/80 | HR 100 | Temp 97.6°F | Ht 72.0 in | Wt 212.0 lb

## 2014-08-17 DIAGNOSIS — E039 Hypothyroidism, unspecified: Secondary | ICD-10-CM

## 2014-08-17 DIAGNOSIS — E119 Type 2 diabetes mellitus without complications: Secondary | ICD-10-CM | POA: Diagnosis not present

## 2014-08-17 DIAGNOSIS — I2510 Atherosclerotic heart disease of native coronary artery without angina pectoris: Secondary | ICD-10-CM | POA: Diagnosis not present

## 2014-08-17 DIAGNOSIS — E538 Deficiency of other specified B group vitamins: Secondary | ICD-10-CM

## 2014-08-17 DIAGNOSIS — I10 Essential (primary) hypertension: Secondary | ICD-10-CM | POA: Diagnosis not present

## 2014-08-17 LAB — CBC WITH DIFFERENTIAL/PLATELET
Basophils Absolute: 0 10*3/uL (ref 0.0–0.1)
Basophils Relative: 0.4 % (ref 0.0–3.0)
Eosinophils Absolute: 0.1 10*3/uL (ref 0.0–0.7)
Eosinophils Relative: 1.3 % (ref 0.0–5.0)
HCT: 39.5 % (ref 39.0–52.0)
Hemoglobin: 13.3 g/dL (ref 13.0–17.0)
Lymphocytes Relative: 20.3 % (ref 12.0–46.0)
Lymphs Abs: 0.8 10*3/uL (ref 0.7–4.0)
MCHC: 33.8 g/dL (ref 30.0–36.0)
MCV: 95.7 fl (ref 78.0–100.0)
Monocytes Absolute: 0.2 10*3/uL (ref 0.1–1.0)
Monocytes Relative: 4.8 % (ref 3.0–12.0)
Neutro Abs: 3 10*3/uL (ref 1.4–7.7)
Neutrophils Relative %: 73.2 % (ref 43.0–77.0)
Platelets: 146 10*3/uL — ABNORMAL LOW (ref 150.0–400.0)
RBC: 4.13 Mil/uL — ABNORMAL LOW (ref 4.22–5.81)
RDW: 13.8 % (ref 11.5–15.5)
WBC: 4.1 10*3/uL (ref 4.0–10.5)

## 2014-08-17 LAB — COMPREHENSIVE METABOLIC PANEL
ALT: 12 U/L (ref 0–53)
AST: 19 U/L (ref 0–37)
Albumin: 4.1 g/dL (ref 3.5–5.2)
Alkaline Phosphatase: 80 U/L (ref 39–117)
BUN: 17 mg/dL (ref 6–23)
CO2: 25 mEq/L (ref 19–32)
Calcium: 9 mg/dL (ref 8.4–10.5)
Chloride: 104 mEq/L (ref 96–112)
Creatinine, Ser: 1.5 mg/dL (ref 0.4–1.5)
GFR: 47.7 mL/min — ABNORMAL LOW (ref >60.00)
Glucose, Bld: 250 mg/dL — ABNORMAL HIGH (ref 70–99)
Potassium: 4.2 mEq/L (ref 3.5–5.1)
Sodium: 135 mEq/L (ref 135–145)
Total Bilirubin: 1.3 mg/dL — ABNORMAL HIGH (ref 0.2–1.2)
Total Protein: 7.1 g/dL (ref 6.0–8.3)

## 2014-08-17 LAB — HEMOGLOBIN A1C: Hgb A1c MFr Bld: 7.1 % — ABNORMAL HIGH (ref 4.6–6.5)

## 2014-08-17 LAB — URINALYSIS, ROUTINE W REFLEX MICROSCOPIC
Hgb urine dipstick: NEGATIVE
Leukocytes, UA: NEGATIVE
Nitrite: NEGATIVE
Specific Gravity, Urine: 1.025 (ref 1.000–1.030)
Total Protein, Urine: 100 — AB
Urine Glucose: 250 — AB
Urobilinogen, UA: 0.2 (ref 0.0–1.0)
pH: 6 (ref 5.0–8.0)

## 2014-08-17 LAB — MICROALBUMIN / CREATININE URINE RATIO
Creatinine,U: 367.4 mg/dL
Microalb Creat Ratio: 26.2 mg/g (ref 0.0–30.0)
Microalb, Ur: 96.4 mg/dL — ABNORMAL HIGH (ref 0.0–1.9)

## 2014-08-17 LAB — TSH: TSH: 1.62 u[IU]/mL (ref 0.35–4.50)

## 2014-08-17 MED ORDER — INSULIN ISOPHANE & REGULAR (HUMAN 70-30)100 UNIT/ML KWIKPEN: 18.0000 [IU] | PEN_INJECTOR | Freq: Every morning | SUBCUTANEOUS | Status: DC

## 2014-08-17 NOTE — Patient Instructions (Signed)
Please check blood sugars at least half the time about 2 hours after any meal and 3 times per week on waking up. Please bring blood sugar monitor to each visit  Take a starch snack before any extra physical activity  Call back with names of your blood pressure Meds

## 2014-08-17 NOTE — Telephone Encounter (Signed)
[facility] thank you

## 2014-08-17 NOTE — Telephone Encounter (Signed)
Rx sent 

## 2014-08-17 NOTE — Telephone Encounter (Signed)
Please order

## 2014-08-17 NOTE — Progress Notes (Signed)
Patient ID: Steve Bailey, male   DOB: [birth year], 80 y.o.   MRN: [id number]    Reason for Appointment: Diabetes follow-up   History of Present Illness   Diagnosis: Type 2 DIABETES MELITUS, date of diagnosis: 1995          Oral hypoglycemic drugs: None      Insulin regimen: Humulin 70/30, 20 units 10-15 min a.c. twice a day with pens       History:  He has not been seen in followup for over a year now His blood sugars are difficult to assess since he has not checked his blood sugar in 2 months and no recent labs available He has been on long-term Humulin premixed insulin twice a day before meals and is quite compliant with this.  HEMOGLOBIN A1c is usually upper normal in the past His diet is somewhat variable but had not seen any dietitian in several years  He does appear to have lost weight this year  Glucometer: Accucheck Aviva, this is nonfunctional.  Blood Glucose readings: None for 2 months  Hypoglycemia frequency: None recently. He does not usually take an extra snack before doing heavy physical activity and will take a sweet drink when his blood sugar feels low and he is sweating  Carbohydrate intake: at meals: Mostly with vegetables, also getting some lean meats like Chicken and pork but not consistently.  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  Weight history:  Wt Readings from Last 3 Encounters:  08/17/14 212 lb (96.163 kg)  04/30/14 215 lb (97.523 kg)  01/08/14 224 lb (101.606 kg)     Lab Results  Component Value Date   HGBA1C 6.3 05/25/2013   Lab Results  Component Value Date   MICROALBUR 6.8* 05/25/2013   LDLCALC 71 05/04/2014   CREATININE 1.1 08/09/2013             HYPERTENSION:  Has been present for several years.  now checking blood pressure at home and he thinks recent readings are 120-135 His medications were adjusted by the cardiologist recently but he does not know which medications he is taking and not clear if he is taking the amlodipine twice a  day   HYPERLIPIDEMIA:         The lipid abnormality consists of elevated LDL  and this is fairly well-controlled, followed by cardiologist   Also has low HDL    No visits with results within 1 Week(s) from this visit. Latest known visit with results is:  Office Visit on 04/30/2014  Component Date Value Ref Range Status  . Cholesterol 05/04/2014 133  0 - 200 mg/dL Final   Comment: ATP III Classification:                                < 200        mg/dL        Desirable                               200 - 239     mg/dL        Borderline High                               >= 240        mg/dL  High                             . Triglycerides 05/04/2014 131  <150 mg/dL Final  . HDL 05/04/2014 36* >39 mg/dL Final  . Total CHOL/HDL Ratio 05/04/2014 3.7   Final  . VLDL 05/04/2014 26  0 - 40 mg/dL Final  . LDL Cholesterol 05/04/2014 71  0 - 99 mg/dL Final   Comment:                            Total Cholesterol/HDL Ratio:CHD Risk                                                 Coronary Heart Disease Risk Table                                                                 Men       Women                                   1/2 Average Risk              3.4        3.3                                       Average Risk              5.0        4.4                                    2X Average Risk              9.6        7.1                                    3X Average Risk             23.4       11.0                          Use the calculated Patient Ratio above and the CHD Risk table                           to determine the patient's CHD Risk.                          ATP III Classification (LDL):                                <  100        mg/dL         Optimal                               100 - 129     mg/dL         Near or Above Optimal                               130 - 159     mg/dL         Borderline High                               160 - 189     mg/dL         High                                 > 190        mg/dL         Very High                                 Medication List       This list is accurate as of: 08/17/14 10:03 AM.  Always use your most recent med list.               alfuzosin 10 MG 24 hr tablet  Commonly known as:  UROXATRAL  Take 10 mg by mouth daily.     amLODipine 10 MG tablet  Commonly known as:  NORVASC  Take 1 tablet (10 mg total) by mouth daily.     apixaban 2.5 MG Tabs tablet  Commonly known as:  ELIQUIS  Take 1 tablet (2.5 mg total) by mouth 2 (two) times daily.     atorvastatin 40 MG tablet  Commonly known as:  LIPITOR  TAKE 1 TABLET EVERY DAY     Cinnamon 500 MG capsule  Take 500 mg by mouth 2 (two) times daily.     finasteride 5 MG tablet  Commonly known as:  PROSCAR  Take 5 mg by mouth daily.     glucose blood test strip  Commonly known as:  ACCU-CHEK ACTIVE STRIPS  Use as instructed     HUMULIN 70/30 [place]  Inject 20 Units into the skin 2 (two) times daily.     insulin NPH-regular Human (70-30) 100 UNIT/ML injection  Commonly known as:  NOVOLIN 70/30  INJECT 20 UNITS INTO SKIN 2 (TWO) TIMES A DAY     Insulin Isophane & Regular Human (70-30) 100 UNIT/ML PEN  Commonly known as:  HUMULIN 70/30 KWIKPEN  Inject 18 Units into the skin every morning. and 22 units at supper daily     levothyroxine 25 MCG tablet  Commonly known as:  SYNTHROID, LEVOTHROID  Take 1 tablet (25 mcg total) by mouth daily.     lisinopril 20 MG tablet  Commonly known as:  PRINIVIL,ZESTRIL  Take 1 tablet (20 mg total) by mouth daily.     metoprolol succinate 100 MG 24 hr tablet  Commonly known as:  TOPROL-XL  Take 1 tablet (100 mg total) by mouth daily. Take with or immediately following  a meal.     nitroGLYCERIN 0.4 MG SL tablet  Commonly known as:  NITROSTAT  Place 1 tablet (0.4 mg total) under the tongue every 5 (five) minutes as needed for chest pain.     OVER THE COUNTER MEDICATION  Take 1 tablet by mouth daily. Mega  Red Capsules        Allergies:  Allergies  Allergen Reactions  . Cephalexin Nausea And Vomiting    Past Medical History  Diagnosis Date  . DM (diabetes mellitus)   . Hyperlipidemia   . Tobacco abuse   . CAD (coronary artery disease)     remote CABG in 1997; prior PCI to the LAD i n1990; Nuclear study in February of 2010 with an EF of 60% with repeat cath showing distal LAD disease. Managed medically  . 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 Atrial fibrillation dx 10/10/12    started on med 10/10/12  . Hypertension dx 10/10/12  . Thyroid disease dx 10/10/12    started on Synthroid  . History of radiation therapy 11/24/12-01/18/13    Prostate 78Gy/40fx    Past Surgical History  Procedure Laterality Date  . Coronary artery bypass graft  11-1995    shows distal LAD disease, without recurrent symptoms of angina with questionable mild apical ischemia but with normal EF   . Angioplasty      of his left anterior descending previously in 1990.  . Cardiovascular stress test  12-2008    showed EF 60%  . Cataract extraction  2010    bilateral  . Knee surgery      left, arthroscopic  . Carpal tunnel release      right hand  . Esophagogastroduodenoscopy (egd) with esophageal dilation  05/2012    Family History  Problem Relation Age of Onset  . Stroke Father   . Heart attack Mother   . Cancer Sister     lung  . Cancer Brother     prostate  . Diabetes Brother   . Stroke Brother   . Stroke Brother   . Heart disease Brother   . Heart disease Brother   . Heart attack Sister   . Heart disease Sister   . Heart attack Sister   . Cancer Sister     breast  . Heart disease Sister   . Heart attack Sister   . Heart disease Sister   . Heart disease Sister     Social History:  reports that he quit smoking about 34 years ago. He quit smokeless tobacco use about 35 years ago. His smokeless tobacco use included Chew. He reports that he does not drink alcohol or use  illicit drugs.  Review of Systems  Cardiovascular: Negative for palpitations and leg swelling.       Has known atrial fibrillation  Neurological:       He has no numbness or tingling in his toes  Endo/Heme/Allergies:       He has a diagnosis of hypothyroidism and known results of TSH available; he does not think he is taking any thyroid medication even though it is on his chart     Examination:   BP 136/80  Pulse 100  Temp(Src) 97.6 F (36.4 C) (Oral)  Ht 6' (1.829 m)  Wt 212 lb (96.163 kg)  BMI 28.75 kg/m2  SpO2 97%  Body mass index is 28.75 kg/(m^2).   Diabetic foot exam done  Heart rhythm is irregular  Assesment/PLAN:  1. Diabetes type 2 with BMI 29   The patient's diabetes control  needs to be assessed with A1c today Also he needs to resume glucose monitoring, given a new Accu-Chek monitor and instructed on its use Discussed need to alternate fasting and postprandial monitoring and bring the monitor for review on each visit For now he will continue the same dose of insulin before meals   He appears to be losing weight this year and probably doing better on diet Discussed timing of insulin, prevention of hypoglycemia with exercise and foot care  No diabetic complications, last microalbumin was normal   2. History of dyslipidemia followed by cardiologist, has borderline LDL of 71 and low HDL   3. Reduced pedal pulses but no symptoms of claudication  ? Hypothyroidism: Apparently diagnosed in the past and will recheck TSH today  He has had his influenza vaccine at the drug store, need to verify Pneumovax history  Counseling time over 50% of today's 25 minute visit  Akio Hudnall 08/17/2014, 10:03 AM

## 2014-09-10 DIAGNOSIS — N400 Enlarged prostate without lower urinary tract symptoms: Secondary | ICD-10-CM | POA: Diagnosis not present

## 2014-09-10 DIAGNOSIS — C61 Malignant neoplasm of prostate: Secondary | ICD-10-CM | POA: Diagnosis not present

## 2014-09-10 DIAGNOSIS — R351 Nocturia: Secondary | ICD-10-CM | POA: Diagnosis not present

## 2014-09-15 ENCOUNTER — Telehealth: Payer: Self-pay | Admitting: *Deleted

## 2014-09-15 MED ORDER — APIXABAN 2.5 MG PO TABS
2.5000 mg | ORAL_TABLET | Freq: Two times a day (BID) | ORAL | Status: DC
Start: 2014-09-15 — End: 2014-10-31

## 2014-09-15 NOTE — Telephone Encounter (Signed)
[facility]ax received refill request [facility]. Medication sent to pharmacy.

## 2014-09-17 DIAGNOSIS — R3912 Poor urinary stream: Secondary | ICD-10-CM | POA: Diagnosis not present

## 2014-09-17 DIAGNOSIS — C61 Malignant neoplasm of prostate: Secondary | ICD-10-CM | POA: Diagnosis not present

## 2014-09-17 DIAGNOSIS — N401 Enlarged prostate with lower urinary tract symptoms: Secondary | ICD-10-CM | POA: Diagnosis not present

## 2014-09-18 ENCOUNTER — Telehealth: Payer: Self-pay | Admitting: Cardiovascular Disease

## 2014-09-18 NOTE — Telephone Encounter (Signed)
To soon for refills

## 2014-09-18 NOTE — Telephone Encounter (Signed)
Received fax refill request  Rx # [id number] Medication:  Lisinopril 20 mg tablet Qty 90 Sig:  Take one tablet by mouth every day Physician:  Bronson Ing

## 2014-10-04 ENCOUNTER — Other Ambulatory Visit: Payer: Self-pay | Admitting: Endocrinology

## 2014-10-30 ENCOUNTER — Other Ambulatory Visit: Payer: Self-pay | Admitting: *Deleted

## 2014-10-30 DIAGNOSIS — E11351 Type 2 diabetes mellitus with proliferative diabetic retinopathy with macular edema: Secondary | ICD-10-CM | POA: Diagnosis not present

## 2014-10-30 DIAGNOSIS — H31012 Macula scars of posterior pole (postinflammatory) (post-traumatic), left eye: Secondary | ICD-10-CM | POA: Diagnosis not present

## 2014-10-30 MED ORDER — GLUCOSE BLOOD VI STRP: ORAL_STRIP | Status: DC

## 2014-10-31 ENCOUNTER — Encounter: Payer: Self-pay | Admitting: Cardiovascular Disease

## 2014-10-31 ENCOUNTER — Ambulatory Visit (INDEPENDENT_AMBULATORY_CARE_PROVIDER_SITE_OTHER): Payer: Medicare Other | Admitting: Cardiovascular Disease

## 2014-10-31 VITALS — BP 142/82 | HR 83 | Ht 72.0 in | Wt 214.0 lb

## 2014-10-31 DIAGNOSIS — E119 Type 2 diabetes mellitus without complications: Secondary | ICD-10-CM | POA: Diagnosis not present

## 2014-10-31 DIAGNOSIS — Z91148 Patient's other noncompliance with medication regimen for other reason: Secondary | ICD-10-CM

## 2014-10-31 DIAGNOSIS — Z9114 Patient's other noncompliance with medication regimen: Secondary | ICD-10-CM

## 2014-10-31 DIAGNOSIS — E038 Other specified hypothyroidism: Secondary | ICD-10-CM

## 2014-10-31 DIAGNOSIS — I2510 Atherosclerotic heart disease of native coronary artery without angina pectoris: Secondary | ICD-10-CM | POA: Diagnosis not present

## 2014-10-31 DIAGNOSIS — E785 Hyperlipidemia, unspecified: Secondary | ICD-10-CM

## 2014-10-31 DIAGNOSIS — I10 Essential (primary) hypertension: Secondary | ICD-10-CM | POA: Diagnosis not present

## 2014-10-31 DIAGNOSIS — Z5181 Encounter for therapeutic drug level monitoring: Secondary | ICD-10-CM | POA: Diagnosis not present

## 2014-10-31 DIAGNOSIS — I25708 Atherosclerosis of coronary artery bypass graft(s), unspecified, with other forms of angina pectoris: Secondary | ICD-10-CM | POA: Diagnosis not present

## 2014-10-31 DIAGNOSIS — Z7901 Long term (current) use of anticoagulants: Secondary | ICD-10-CM

## 2014-10-31 MED ORDER — APIXABAN 5 MG PO TABS: 5.0000 mg | ORAL_TABLET | Freq: Two times a day (BID) | ORAL | Status: DC

## 2014-10-31 NOTE — Progress Notes (Signed)
Patient ID: Steve Bailey, male   DOB: March 12, 1934, 80 y.o.   MRN: [id number]      SUBJECTIVE: The patient is a 80 year old male who has a history of coronary artery disease and coronary artery bypass graft surgery, with his most recent cardiac catheterization performed in 2010. He also has hypertension, hyperlipidemia, IDDM, and atrial fibrillation. He is noted to be noncompliant with his medication regimen.  He has been taking Eliquis 2.5 mg daily. He only gets chest pain on occasion when he walks uphill. He denies leg swelling and palpitations.   Review of Systems: As per "subjective", otherwise negative.  Allergies  Allergen Reactions  . Cephalexin Nausea And Vomiting    Current Outpatient Prescriptions  Medication Sig Dispense Refill  . alfuzosin (UROXATRAL) 10 MG 24 hr tablet Take 10 mg by mouth daily.    Marland Kitchen amLODipine (NORVASC) 10 MG tablet Take 1 tablet (10 mg total) by mouth daily. 90 tablet 3  . apixaban (ELIQUIS) 2.5 MG TABS tablet Take 1 tablet (2.5 mg total) by mouth 2 (two) times daily. 180 tablet 1  . atorvastatin (LIPITOR) 40 MG tablet TAKE 1 TABLET EVERY DAY 90 tablet 3  . Cinnamon 500 MG capsule Take 500 mg by mouth 2 (two) times daily.    . finasteride (PROSCAR) 5 MG tablet Take 5 mg by mouth daily.    Marland Kitchen glucose blood (ACCU-CHEK AVIVA PLUS) test strip Use as instructed to check blood sugar daily 50 each 2  . HUMULIN 70/30 KWIKPEN (70-30) 100 UNIT/ML PEN INJECT 18 UNITS INTO THE SKIN EVERY MORNING & 22 UNITS AT SUPPER DAILY 45 pen 1  . Insulin Isophane & Regular (HUMULIN 70/30 [place]) Inject 20 Units into the skin 2 (two) times daily.     . insulin NPH-regular (NOVOLIN 70/30) (70-30) 100 UNIT/ML injection INJECT 20 UNITS INTO SKIN 2 (TWO) TIMES A DAY 10 mL 3  . levothyroxine (SYNTHROID, LEVOTHROID) 25 MCG tablet Take 1 tablet (25 mcg total) by mouth daily. 30 tablet 11  . lisinopril (PRINIVIL,ZESTRIL) 20 MG tablet Take 1 tablet (20 mg total) by mouth daily. 30 tablet 6  .  metoprolol succinate (TOPROL-XL) 100 MG 24 hr tablet Take 1 tablet (100 mg total) by mouth daily. Take with or immediately following a meal. 90 tablet 1  . nitroGLYCERIN (NITROSTAT) 0.4 MG SL tablet Place 1 tablet (0.4 mg total) under the tongue every 5 (five) minutes as needed for chest pain. 25 tablet 6  . OVER THE COUNTER MEDICATION Take 1 tablet by mouth daily. Mega Red Capsules     No current facility-administered medications for this visit.    Past Medical History  Diagnosis Date  . DM (diabetes mellitus)   . Hyperlipidemia   . Tobacco abuse   . CAD (coronary artery disease)     remote CABG in 1997; prior PCI to the LAD i n1990; Nuclear study in February of 2010 with an EF of 60% with repeat cath showing distal LAD disease. Managed medically  . 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 Atrial fibrillation dx 10/10/12    started on med 10/10/12  . Hypertension dx 10/10/12  . Thyroid disease dx 10/10/12    started on Synthroid  . History of radiation therapy 11/24/12-01/18/13    Prostate 78Gy/72fx    Past Surgical History  Procedure Laterality Date  . Coronary artery bypass graft  11-1995    shows distal LAD disease, without recurrent symptoms of angina with questionable  mild apical ischemia but with normal EF   . Angioplasty      of his left anterior descending previously in 1990.  . Cardiovascular stress test  12-2008    showed EF 60%  . Cataract extraction  2010    bilateral  . Knee surgery      left, arthroscopic  . Carpal tunnel release      right hand  . Esophagogastroduodenoscopy (egd) with esophageal dilation  05/2012    History   Social History  . Marital Status: Married    Spouse Name: N/A    Number of Children: N/A  . Years of Education: N/A   Occupational History  . Not on file.   Social History Main Topics  . Smoking status: Former Smoker    Quit date: 10/14/1979  . Smokeless tobacco: Former Systems developer    Types: [place] date:  11/23/1978  . Alcohol Use: No  . Drug Use: No  . Sexual Activity: No   Other Topics Concern  . Not on file   Social History Narrative  . No narrative on file    BP 142/82  Pulse 83  Wt 214 lbs Ht 6'  PHYSICAL EXAM General: NAD HEENT: Normal. Neck: No JVD, no thyromegaly. Lungs: Clear to auscultation bilaterally with normal respiratory effort. CV: Nondisplaced PMI.  Regular rate and rhythm, normal S1/S2, no S3/S4, no murmur. No pretibial or periankle edema.  No carotid bruit.  Normal pedal pulses.  Abdomen: Soft, nontender, no hepatosplenomegaly, no distention.  Neurologic: Alert and oriented x 3.  Psych: Normal affect. Skin: Normal. Musculoskeletal: Normal range of motion, no gross deformities. Extremities: No clubbing or cyanosis.   ECG: Most recent ECG reviewed.    ASSESSMENT AND PLAN: 1. CAD s/p CABG: Symptomatically stable. Continue Lipitor and metoprolol. As he is on Eliquis, I am avoiding ASA. 2. Essential HTN: Mildly elevated today. Will monitor with no changes today. Continue lisinopril and amlodipine. 3. Hyperlipidemia: Lipids on 05/04/14 showed TC 133, TG 131, HDL 36, LDL 71. Continue Lipitor 40 mg. 4. Atrial fibrillation: Currently in a regular rhythm. I have carefully instructed him to take Eliquis 5 mg bid and highlighted it on his medication list. 5. Hypothyroidism: TSH 1.62 on 08/17/2014. No changes. 6. Diabetes mellitus: HbA1C 7.1% on 08/17/14.  Dispo: f/u 6 months.   Kate Sable, M.D., F.A.C.C.

## 2014-10-31 NOTE — Patient Instructions (Signed)
Your physician wants you to follow-up in: [place]. [place] You will receive a reminder letter in the mail two months in advance. If you don't receive a letter, please call our office to schedule the follow-up appointment.  Your physician has recommended you make the following change in your medication:   TAKE ELIQUIS 5 MG TWICE A DAY  Thank you for choosing [facility]!!

## 2014-11-19 ENCOUNTER — Ambulatory Visit (INDEPENDENT_AMBULATORY_CARE_PROVIDER_SITE_OTHER): Payer: Medicare Other | Admitting: Endocrinology

## 2014-11-19 ENCOUNTER — Other Ambulatory Visit: Payer: Self-pay | Admitting: *Deleted

## 2014-11-19 ENCOUNTER — Encounter: Payer: Self-pay | Admitting: Endocrinology

## 2014-11-19 VITALS — BP 138/68 | HR 82 | Temp 97.9°F | Resp 14 | Ht 72.0 in | Wt 216.4 lb

## 2014-11-19 DIAGNOSIS — E7800 Pure hypercholesterolemia, unspecified: Secondary | ICD-10-CM

## 2014-11-19 DIAGNOSIS — Z6829 Body mass index (BMI) 29.0-29.9, adult: Secondary | ICD-10-CM

## 2014-11-19 DIAGNOSIS — E119 Type 2 diabetes mellitus without complications: Secondary | ICD-10-CM | POA: Diagnosis not present

## 2014-11-19 DIAGNOSIS — E1165 Type 2 diabetes mellitus with hyperglycemia: Secondary | ICD-10-CM

## 2014-11-19 DIAGNOSIS — E669 Obesity, unspecified: Secondary | ICD-10-CM

## 2014-11-19 DIAGNOSIS — IMO0002 Reserved for concepts with insufficient information to code with codable children: Secondary | ICD-10-CM

## 2014-11-19 DIAGNOSIS — I10 Essential (primary) hypertension: Secondary | ICD-10-CM | POA: Diagnosis not present

## 2014-11-19 DIAGNOSIS — I2510 Atherosclerotic heart disease of native coronary artery without angina pectoris: Secondary | ICD-10-CM

## 2014-11-19 LAB — GLUCOSE, POCT (MANUAL RESULT ENTRY): POC Glucose: 191 mg/dl — AB (ref 70–99)

## 2014-11-19 MED ORDER — GLUCOSE BLOOD VI STRP: ORAL_STRIP | Status: DC

## 2014-11-19 NOTE — Patient Instructions (Signed)
Please check blood sugars at least half the time about 2 hours after any meal and 3 times per week on waking up. Please bring blood sugar monitor to each visit  If sugar at 10-11 am high go up to 23 units in am  If sugar high after supper and in ams go up on pm dose to 23  Call sugar readings in 2 weeks to [phone]

## 2014-11-19 NOTE — Progress Notes (Signed)
Patient ID: Steve Bailey, male   DOB: 1934/04/21, 80 y.o.   MRN: [id number]    Reason for Appointment: Diabetes follow-up   History of Present Illness   Diagnosis: Type 2 DIABETES MELITUS, date of diagnosis: 1995          Oral hypoglycemic drugs: None      Insulin regimen: Humulin 70/30, 20 units 10-15 min a.c. twice a day with pens       History:  He has not been checking his blood sugars at home again as he thinks he has the wrong strips and did not try to get new strips seen in followup for over a year now His blood sugars are difficult to assess since he has not checked his blood sugar in 2 months and no recent labs available He has been on long-term Humulin premixed insulin twice a day.  Sometimes he will take the insulin right after eating He apparently took the generic OTC version of the premixed insulin for sometime when he ran out of his prescription.  HEMOGLOBIN A1c was higher than usual on his last visit at 7.1   Glucometer: One Touch Verio  Blood Glucose readings: None  available  Hypoglycemia frequency: None .  He does not usually take an extra snack before doing heavy physical activity; will take a sweet drink when his blood sugar feels low and he is sweating  Carbohydrate intake: at meals: Mostly with vegetables, also getting some lean meats like Chicken and pork but not consistently.  Had sausage and bread this morning for breakfast.   Physical activity: Usually active or trying to walk indoors in winter Weight history:  Wt Readings from Last 3 Encounters:  11/19/14 216 lb 6.4 oz (98.158 kg)  10/31/14 214 lb (97.07 kg)  08/17/14 212 lb (96.163 kg)     Lab Results  Component Value Date   HGBA1C 7.1* 08/17/2014   HGBA1C 6.3 05/25/2013   Lab Results  Component Value Date   MICROALBUR 96.4* 08/17/2014   LDLCALC 71 05/04/2014   CREATININE 1.5 08/17/2014          Medication List       This list is accurate as of: 11/19/14 10:35 AM.  Always use your  most recent med list.               alfuzosin 10 MG 24 hr tablet  Commonly known as:  UROXATRAL  Take 10 mg by mouth daily.     amLODipine 10 MG tablet  Commonly known as:  NORVASC  Take 1 tablet (10 mg total) by mouth daily.     ELIQUIS 2.5 MG Tabs tablet  Generic drug:  apixaban  Take 2.5 mg by mouth. Takes 1 tablet once a day     apixaban 5 MG Tabs tablet  Commonly known as:  ELIQUIS  Take 1 tablet (5 mg total) by mouth 2 (two) times daily.     atorvastatin 40 MG tablet  Commonly known as:  LIPITOR  TAKE 1 TABLET EVERY DAY     Cinnamon 500 MG capsule  Take 500 mg by mouth 2 (two) times daily.     finasteride 5 MG tablet  Commonly known as:  PROSCAR  Take 5 mg by mouth daily.     FREESTYLE LITE test strip  Generic drug:  glucose blood  1 each by Other route as needed for other. Use as instructed 2 times per day     glucose blood test strip  Commonly known  as:  ACCU-CHEK AVIVA PLUS  Use as instructed to check blood sugar daily     HUMULIN 70/30 [place]  Inject 20 Units into the skin 2 (two) times daily.     insulin NPH-regular Human (70-30) 100 UNIT/ML injection  Commonly known as:  NOVOLIN 70/30  INJECT 20 UNITS INTO SKIN 2 (TWO) TIMES A DAY     HUMULIN 70/30 KWIKPEN (70-30) 100 UNIT/ML PEN  Generic drug:  Insulin Isophane & Regular Human  INJECT 18 UNITS INTO THE SKIN EVERY MORNING & 22 UNITS AT SUPPER DAILY     levothyroxine 25 MCG tablet  Commonly known as:  SYNTHROID, LEVOTHROID  Take 1 tablet (25 mcg total) by mouth daily.     lisinopril 20 MG tablet  Commonly known as:  PRINIVIL,ZESTRIL  Take 1 tablet (20 mg total) by mouth daily.     metoprolol succinate 100 MG 24 hr tablet  Commonly known as:  TOPROL-XL  Take 1 tablet (100 mg total) by mouth daily. Take with or immediately following a meal.     nitroGLYCERIN 0.4 MG SL tablet  Commonly known as:  NITROSTAT  Place 1 tablet (0.4 mg total) under the tongue every 5 (five) minutes as needed for chest  pain.     OVER THE COUNTER MEDICATION  Take 1 tablet by mouth daily. Mega Red Capsules        Allergies:  Allergies  Allergen Reactions  . Cephalexin Nausea And Vomiting    Past Medical History  Diagnosis Date  . DM (diabetes mellitus)   . Hyperlipidemia   . Tobacco abuse   . CAD (coronary artery disease)     remote CABG in 1997; prior PCI to the LAD i n1990; Nuclear study in February of 2010 with an EF of 60% with repeat cath showing distal LAD disease. Managed medically  . 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 Atrial fibrillation dx 10/10/12    started on med 10/10/12  . Hypertension dx 10/10/12  . Thyroid disease dx 10/10/12    started on Synthroid  . History of radiation therapy 11/24/12-01/18/13    Prostate 78Gy/65fx    Past Surgical History  Procedure Laterality Date  . Coronary artery bypass graft  11-1995    shows distal LAD disease, without recurrent symptoms of angina with questionable mild apical ischemia but with normal EF   . Angioplasty      of his left anterior descending previously in 1990.  . Cardiovascular stress test  12-2008    showed EF 60%  . Cataract extraction  2010    bilateral  . Knee surgery      left, arthroscopic  . Carpal tunnel release      right hand  . Esophagogastroduodenoscopy (egd) with esophageal dilation  05/2012    Family History  Problem Relation Age of Onset  . Stroke Father   . Heart attack Mother   . Cancer Sister     lung  . Cancer Brother     prostate  . Diabetes Brother   . Stroke Brother   . Stroke Brother   . Heart disease Brother   . Heart disease Brother   . Heart attack Sister   . Heart disease Sister   . Heart attack Sister   . Cancer Sister     breast  . Heart disease Sister   . Heart attack Sister   . Heart disease Sister   . Heart disease Sister  Social History:  reports that he quit smoking about 35 years ago. He quit smokeless tobacco use about 36 years ago. His smokeless  tobacco use included Chew. He reports that he does not drink alcohol or use illicit drugs.  Review of Systems  Cardiovascular:       Has known atrial fibrillation and was in sinus rhythm earlier this month with cardiologist  Neurological:            He has a diagnosis of hypothyroidism probably diagnosed in 2013 by cardiologist. He does not know if he is taking any thyroid medication even though it is on his chart   Lab Results  Component Value Date   TSH 1.62 08/17/2014   TSH 4.876* 01/29/2014   TSH 3.37 08/09/2013           HYPERTENSION:  Has been present for several years.    His medications were adjusted by the cardiologist   HYPERLIPIDEMIA:         The lipid abnormality consists of elevated LDL  and this is fairly well-controlled, followed by cardiologist   Also has low HDL   Lab Results  Component Value Date   CHOL 133 05/04/2014   HDL 36* 05/04/2014   LDLCALC 71 05/04/2014   TRIG 131 05/04/2014   CHOLHDL 3.7 05/04/2014    Diabetic foot exam done in 9/15 showed normal monofilament sensation but reduced pulses   Examination:   BP 138/68 mmHg  Pulse 82  Temp(Src) 97.9 F (36.6 C)  Resp 14  Ht 6' (1.829 m)  Wt 216 lb 6.4 oz (98.158 kg)  BMI 29.34 kg/m2  SpO2 98%  Body mass index is 29.34 kg/(m^2).   No pedal edema present  Assesment/PLAN:  1. Diabetes type 2 with BMI 29   The patient's diabetes control  needs to be assessed with A1c today Also he needs to resume glucose monitoring and was given a prescription for strips. His glucose is high today in the office after breakfast but not clear if he has high fasting readings also He has not been watching his diet consistently recently and not clear if he needs higher doses of insulin. Discussed timing of insulin to be taken at least 15 minutes before eating   Discussed need to alternate fasting and postprandial monitoring and bring the monitor for review on each visit He will be given written instructions  on adjusting insulin dose based on glucose readings and also he will call back and 2 weeks to report his sugars.  Discussed these instructions with the patient in detail   2. History of dyslipidemia followed by cardiologist   3. ?  Mild hypothyroidism: He will check to see if he is taking his levothyroxine.  Last TSH was normal  4.  He needs to have a primary care physician.  Also should have Prevnar on his next visit  5.  Hypertension: Appears well controlled   Counseling time over 50% of today's 25 minute visit  Rees Matura 11/19/2014, 10:35 AM

## 2015-02-18 ENCOUNTER — Telehealth: Payer: Self-pay | Admitting: Endocrinology

## 2015-02-18 ENCOUNTER — Ambulatory Visit: Payer: Medicare Other | Admitting: Endocrinology

## 2015-02-18 NOTE — Telephone Encounter (Signed)
Patient no showed today's appt. Please advise on how to follow up. °A. No follow up necessary. °B. Follow up urgent. Contact patient immediately. °C. Follow up necessary. Contact patient and schedule visit in ___ days. °D. Follow up advised. Contact patient and schedule visit in ____weeks. ° °

## 2015-02-18 NOTE — Telephone Encounter (Signed)
Needs to be rescheduled at the next available appointment

## 2015-02-25 ENCOUNTER — Ambulatory Visit (INDEPENDENT_AMBULATORY_CARE_PROVIDER_SITE_OTHER): Payer: Medicare Other | Admitting: Endocrinology

## 2015-02-25 ENCOUNTER — Encounter: Payer: Self-pay | Admitting: Endocrinology

## 2015-02-25 VITALS — BP 142/68 | HR 85 | Temp 97.5°F | Resp 12 | Wt 217.8 lb

## 2015-02-25 DIAGNOSIS — E7800 Pure hypercholesterolemia, unspecified: Secondary | ICD-10-CM

## 2015-02-25 DIAGNOSIS — I10 Essential (primary) hypertension: Secondary | ICD-10-CM

## 2015-02-25 DIAGNOSIS — E119 Type 2 diabetes mellitus without complications: Secondary | ICD-10-CM

## 2015-02-25 DIAGNOSIS — E038 Other specified hypothyroidism: Secondary | ICD-10-CM | POA: Diagnosis not present

## 2015-02-25 LAB — LIPID PANEL
Cholesterol: 121 mg/dL (ref 0–200)
HDL: 38.9 mg/dL — ABNORMAL LOW (ref >39.00)
LDL Cholesterol: 65 mg/dL (ref 0–99)
NonHDL: 82.1
Total CHOL/HDL Ratio: 3
Triglycerides: 85 mg/dL (ref 0.0–149.0)
VLDL: 17 mg/dL (ref 0.0–40.0)

## 2015-02-25 LAB — BASIC METABOLIC PANEL
BUN: 21 mg/dL (ref 6–23)
CO2: 25 mEq/L (ref 19–32)
Calcium: 9 mg/dL (ref 8.4–10.5)
Chloride: 108 mEq/L (ref 96–112)
Creatinine, Ser: 1.2 mg/dL (ref 0.40–1.50)
GFR: 62.1 mL/min (ref >60.00)
Glucose, Bld: 108 mg/dL — ABNORMAL HIGH (ref 70–99)
Potassium: 4.5 mEq/L (ref 3.5–5.1)
Sodium: 137 mEq/L (ref 135–145)

## 2015-02-25 LAB — TSH: TSH: 4.25 u[IU]/mL (ref 0.35–4.50)

## 2015-02-25 LAB — HEMOGLOBIN A1C: Hgb A1c MFr Bld: 6.9 % — ABNORMAL HIGH (ref 4.6–6.5)

## 2015-02-25 NOTE — Progress Notes (Signed)
Patient ID: Steve Bailey, male   DOB: 1934-10-21, 81 y.o.   MRN: [id number]    Reason for Appointment: Diabetes follow-up   History of Present Illness   Diagnosis: Type 2 DIABETES MELITUS, date of diagnosis: 1995          Oral hypoglycemic drugs: None      Insulin regimen: Humulin 70/30, 18-22  20 units   a.c. twice a day with pens       History:  He has not had an A1c checked since 07/2014 He has brought his blood sugar monitor now for download after a long time He does appear to have relatively high readings in the mornings and sometimes in the afternoon but he now says that he checks his blood sugar about 30 minutes after eating, not clear why he is doing this He does think he is taking his insulin right before eating even though A1c checked it to take this 15-30 minutes before eating  He is fairly compliant with his insulin Able to maintain his weight and recently starting to be more active also Not on any oral hypoglycemic drugs  Glucometer:  Freestyle Blood Glucose readings from download  PRE-MEAL  a.m.  Lunch  4-5 PM   7-8 PM  Overall  Glucose range:  138-196   128-210   230, 246   140-184    Mean/median:  165   150    156   168     Hypoglycemia frequency: None .  He  will take a sweet drink when his blood sugar feels low   Carbohydrate intake: at meals: Mostly with vegetables, also getting some lean meats like Chicken and pork but not consistently.   Egg, bacon, grits in am  Meals may be occasionally higher in fat and he is eating a bowl of cereal at bedtime  Physical activity: Usually active or trying to walk   Weight history:  Wt Readings from Last 3 Encounters:  02/25/15 217 lb 12.8 oz (98.793 kg)  11/19/14 216 lb 6.4 oz (98.158 kg)  10/31/14 214 lb (97.07 kg)     Lab Results  Component Value Date   HGBA1C 7.1* 08/17/2014   HGBA1C 6.3 05/25/2013   Lab Results  Component Value Date   MICROALBUR 96.4* 08/17/2014   LDLCALC 71 05/04/2014   CREATININE 1.5  08/17/2014          Medication List       This list is accurate as of: 02/25/15  1:17 PM.  Always use your most recent med list.               alfuzosin 10 MG 24 hr tablet  Commonly known as:  UROXATRAL  Take 10 mg by mouth daily.     amLODipine 10 MG tablet  Commonly known as:  NORVASC  Take 1 tablet (10 mg total) by mouth daily.     ELIQUIS 2.5 MG Tabs tablet  Generic drug:  apixaban  Take 2.5 mg by mouth. Takes 1 tablet once a day     apixaban 5 MG Tabs tablet  Commonly known as:  ELIQUIS  Take 1 tablet (5 mg total) by mouth 2 (two) times daily.     atorvastatin 40 MG tablet  Commonly known as:  LIPITOR  TAKE 1 TABLET EVERY DAY     Cinnamon 500 MG capsule  Take 500 mg by mouth 2 (two) times daily.     finasteride 5 MG tablet  Commonly known as:  PROSCAR  Take 5 mg by mouth daily.     glucose blood test strip  Commonly known as:  FREESTYLE LITE  Use as instructed 2 times per day dx code E11.65     HUMULIN 70/30 KWIKPEN (70-30) 100 UNIT/ML PEN  Generic drug:  Insulin Isophane & Regular Human  INJECT 18 UNITS INTO THE SKIN EVERY MORNING & 22 UNITS AT SUPPER DAILY     levothyroxine 25 MCG tablet  Commonly known as:  SYNTHROID, LEVOTHROID  Take 1 tablet (25 mcg total) by mouth daily.     lisinopril 20 MG tablet  Commonly known as:  PRINIVIL,ZESTRIL  Take 1 tablet (20 mg total) by mouth daily.     metoprolol succinate 100 MG 24 hr tablet  Commonly known as:  TOPROL-XL  Take 1 tablet (100 mg total) by mouth daily. Take with or immediately following a meal.     nitroGLYCERIN 0.4 MG SL tablet  Commonly known as:  NITROSTAT  Place 1 tablet (0.4 mg total) under the tongue every 5 (five) minutes as needed for chest pain.     OVER THE COUNTER MEDICATION  Take 1 tablet by mouth daily. Mega Red Capsules        Allergies:  Allergies  Allergen Reactions  . Cephalexin Nausea And Vomiting    Past Medical History  Diagnosis Date  . DM (diabetes mellitus)    . Hyperlipidemia   . Tobacco abuse   . CAD (coronary artery disease)     remote CABG in 1997; prior PCI to the LAD i n1990; Nuclear study in February of 2010 with an EF of 60% with repeat cath showing distal LAD disease. Managed medically  . 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 Atrial fibrillation dx 10/10/12    started on med 10/10/12  . Hypertension dx 10/10/12  . Thyroid disease dx 10/10/12    started on Synthroid  . History of radiation therapy 11/24/12-01/18/13    Prostate 78Gy/40fx    Past Surgical History  Procedure Laterality Date  . Coronary artery bypass graft  11-1995    shows distal LAD disease, without recurrent symptoms of angina with questionable mild apical ischemia but with normal EF   . Angioplasty      of his left anterior descending previously in 1990.  . Cardiovascular stress test  12-2008    showed EF 60%  . Cataract extraction  2010    bilateral  . Knee surgery      left, arthroscopic  . Carpal tunnel release      right hand  . Esophagogastroduodenoscopy (egd) with esophageal dilation  05/2012    Family History  Problem Relation Age of Onset  . Stroke Father   . Heart attack Mother   . Cancer Sister     lung  . Cancer Brother     prostate  . Diabetes Brother   . Stroke Brother   . Stroke Brother   . Heart disease Brother   . Heart disease Brother   . Heart attack Sister   . Heart disease Sister   . Heart attack Sister   . Cancer Sister     breast  . Heart disease Sister   . Heart attack Sister   . Heart disease Sister   . Heart disease Sister     Social History:  reports that he quit smoking about 35 years ago. He quit smokeless tobacco use about 36 years ago. His smokeless tobacco use included  Chew. He reports that he does not drink alcohol or use illicit drugs.  ROS     He has a diagnosis of hypothyroidism probably diagnosed in 2013 by cardiologist. He is supposed to be taking 25 g of levothyroxine  Lab Results   Component Value Date   TSH 1.62 08/17/2014   TSH 4.876* 01/29/2014   TSH 3.37 08/09/2013           HYPERTENSION:  Has been present for several years.    His medications were recently not changed by the cardiologist   HYPERLIPIDEMIA:         The lipid abnormality consists of elevated LDL and this is fairly well-controlled, followed by cardiologist   Also has persistently low HDL   Lab Results  Component Value Date   CHOL 133 05/04/2014   HDL 36* 05/04/2014   LDLCALC 71 05/04/2014   TRIG 131 05/04/2014   CHOLHDL 3.7 05/04/2014    Diabetic foot exam done in 9/15 showed normal monofilament sensation but reduced pulses   Examination:   BP 142/68 mmHg  Pulse 85  Temp(Src) 97.5 F (36.4 C) (Oral)  Resp 12  Wt 217 lb 12.8 oz (98.793 kg)  SpO2 95%  Body mass index is 29.53 kg/(m^2).   No pedal edema   Assesment/PLAN:  1. Diabetes type 2 with BMI 29   He is now checking his blood sugar more consistently and is using a brand-new monitor which he finally brought for download He does appear to have relatively high readings at times but as discussed above he is often checking blood sugar 30-60 minutes after eating The patient's diabetes control  needs to be assessed with A1c today Overall he is maintaining his weight and is generally very active despite his age and history of cardiac disease Diet: This may be sometimes higher in fat and he is eating a bowl of cereal at bedtime   For now we will assess his A1c and if it is significantly higher will consider increasing his insulin or starting a small doses of insulin at lunch also  To continue 70/30 insulin as this would be less expensive for him  May also consider metformin  He will try to avoid eating cereal at bedtime and eat peanut butter crackers  To check blood sugars only before eating or 2 hours later  Discussed timing of insulin to be taken at least 15 minutes before eating    Discussed these instructions with  the patient in detail and asked to call if blood sugars are over 200 consistently  2. History of dyslipidemia treated with Lipitor 40 mg Has history of CAD and possible mild PVD with reduced pulses  3.  Hypertension: Well controlled  4.  Mild hypothyroidism: We will check his TSH again   [facility] 02/25/2015, 1:17 PM

## 2015-02-25 NOTE — Patient Instructions (Addendum)
Please check blood sugars at least half the time about 2 hours after any meal and 3 times per week on waking up.   Please bring blood sugar monitor to each visit. Recommended blood sugar levels about 2 hours after meal is 140-180 and on waking up 90-130  Call if sugar >200

## 2015-04-16 DIAGNOSIS — E11351 Type 2 diabetes mellitus with proliferative diabetic retinopathy with macular edema: Secondary | ICD-10-CM | POA: Diagnosis not present

## 2015-04-16 DIAGNOSIS — E11311 Type 2 diabetes mellitus with unspecified diabetic retinopathy with macular edema: Secondary | ICD-10-CM | POA: Diagnosis not present

## 2015-05-20 ENCOUNTER — Other Ambulatory Visit: Payer: Self-pay

## 2015-05-20 MED ORDER — LISINOPRIL 20 MG PO TABS: 20.0000 mg | ORAL_TABLET | Freq: Every day | ORAL | Status: DC

## 2015-05-20 NOTE — Telephone Encounter (Signed)
Refill complete 

## 2015-05-28 ENCOUNTER — Ambulatory Visit: Payer: Medicare Other | Admitting: Endocrinology

## 2015-05-28 ENCOUNTER — Telehealth: Payer: Self-pay | Admitting: Endocrinology

## 2015-05-28 NOTE — Telephone Encounter (Signed)
Patient no showed today's appt. Please advise on how to follow up. °A. No follow up necessary. °B. Follow up urgent. Contact patient immediately. °C. Follow up necessary. Contact patient and schedule visit in ___ days. °D. Follow up advised. Contact patient and schedule visit in ____weeks. ° °

## 2015-05-28 NOTE — Telephone Encounter (Signed)
Needs to be rescheduled, first available

## 2015-06-04 ENCOUNTER — Other Ambulatory Visit: Payer: Self-pay | Admitting: Endocrinology

## 2015-06-06 ENCOUNTER — Encounter: Payer: Self-pay | Admitting: Endocrinology

## 2015-06-06 ENCOUNTER — Other Ambulatory Visit (INDEPENDENT_AMBULATORY_CARE_PROVIDER_SITE_OTHER): Payer: Medicare Other

## 2015-06-06 ENCOUNTER — Ambulatory Visit (INDEPENDENT_AMBULATORY_CARE_PROVIDER_SITE_OTHER): Payer: Medicare Other | Admitting: Endocrinology

## 2015-06-06 VITALS — BP 136/76 | HR 86 | Temp 98.0°F | Resp 16 | Ht 72.0 in | Wt 208.6 lb

## 2015-06-06 DIAGNOSIS — E538 Deficiency of other specified B group vitamins: Secondary | ICD-10-CM | POA: Diagnosis not present

## 2015-06-06 DIAGNOSIS — E038 Other specified hypothyroidism: Secondary | ICD-10-CM

## 2015-06-06 DIAGNOSIS — IMO0002 Reserved for concepts with insufficient information to code with codable children: Secondary | ICD-10-CM

## 2015-06-06 DIAGNOSIS — E7800 Pure hypercholesterolemia, unspecified: Secondary | ICD-10-CM

## 2015-06-06 DIAGNOSIS — E119 Type 2 diabetes mellitus without complications: Secondary | ICD-10-CM

## 2015-06-06 DIAGNOSIS — E1165 Type 2 diabetes mellitus with hyperglycemia: Secondary | ICD-10-CM | POA: Diagnosis not present

## 2015-06-06 LAB — COMPREHENSIVE METABOLIC PANEL
ALT: 14 U/L (ref 0–53)
AST: 18 U/L (ref 0–37)
Albumin: 4.2 g/dL (ref 3.5–5.2)
Alkaline Phosphatase: 82 U/L (ref 39–117)
BUN: 19 mg/dL (ref 6–23)
CO2: 29 mEq/L (ref 19–32)
Calcium: 9.3 mg/dL (ref 8.4–10.5)
Chloride: 103 mEq/L (ref 96–112)
Creatinine, Ser: 1.22 mg/dL (ref 0.40–1.50)
GFR: 60.89 mL/min (ref >60.00)
Glucose, Bld: 90 mg/dL (ref 70–99)
Potassium: 4 mEq/L (ref 3.5–5.1)
Sodium: 138 mEq/L (ref 135–145)
Total Bilirubin: 1.1 mg/dL (ref 0.2–1.2)
Total Protein: 7.1 g/dL (ref 6.0–8.3)

## 2015-06-06 LAB — MICROALBUMIN / CREATININE URINE RATIO
Creatinine,U: 21.9 mg/dL
Microalb Creat Ratio: 6.8 mg/g (ref 0.0–30.0)
Microalb, Ur: 1.5 mg/dL (ref 0.0–1.9)

## 2015-06-06 LAB — LIPID PANEL
Cholesterol: 133 mg/dL (ref 0–200)
HDL: 44.6 mg/dL (ref >39.00)
LDL Cholesterol: 73 mg/dL (ref 0–99)
NonHDL: 88.4
Total CHOL/HDL Ratio: 3
Triglycerides: 75 mg/dL (ref 0.0–149.0)
VLDL: 15 mg/dL (ref 0.0–40.0)

## 2015-06-06 LAB — HEMOGLOBIN A1C: Hgb A1c MFr Bld: 6.9 % — ABNORMAL HIGH (ref 4.6–6.5)

## 2015-06-06 LAB — POCT GLYCOSYLATED HEMOGLOBIN (HGB A1C): Hemoglobin A1C: 6.9

## 2015-06-06 MED ORDER — PIOGLITAZONE HCL-METFORMIN HCL 15-500 MG PO TABS: ORAL_TABLET | ORAL | Status: DC

## 2015-06-06 NOTE — Patient Instructions (Signed)
Must take Insulin 30 min before the meals  Start new diabetes pill at supper  Check blood sugars on waking up ..2  .. times a week Also check blood sugars about 2 hours after a meal and do this after different meals by rotation  Recommended blood sugar levels on waking up is 90-130 and about 2 hours after meal is 140-180 Please bring blood sugar monitor to each visit.

## 2015-06-06 NOTE — Progress Notes (Signed)
Patient ID: Steve Bailey, male   DOB: [birth year], 81 y.o.   MRN: [id number]    Reason for Appointment: Diabetes follow-up   History of Present Illness   Diagnosis: Type 2 DIABETES MELITUS, date of diagnosis: 1995          Oral hypoglycemic drugs: None      Insulin regimen: Humulin 70/30,  20 units   a.c. twice a day with pens       History:  Although his blood sugars have been usually well controlled over the last few years his A1c is now relatively higher since 2015 Previously was taking Actos for insulin resistance and apparently this was stopped by cardiologist but has had no history of LV dysfunction  He has brought his blood sugar monitor now for download  Current blood sugar patterns and problems:  He is checking his blood sugars somewhat randomly before or 30 minutes after eating instead of  2 hours after  He has had sporadic readings over 200 after breakfast or supper  More recently has couple of readings that are fairly good in the morning  Despite discussion about timing of insulin he is now taking this randomly after eating, sometimes up to an hour later  He has lost significant amount of weight this summer and he says he is eating more vegetables and trying to stay active   a long time Not on any oral hypoglycemic drugs  Glucometer:  Freestyle Blood Glucose readings from download   Mean values apply above for all meters except median for One Touch  PRE-MEAL Fasting Lunch Dinner Bedtime Overall  Glucose range:  119, 128      Mean/median:      175    POST-MEAL PC Breakfast PC Lunch PC Dinner  Glucose range:  176, 212   195, 97   184-252   Mean/median:       Hypoglycemia frequency: None . He  will take a sweet drink when his blood sugar feels low   Carbohydrate intake: at meals: Mostly with vegetables, also getting some lean meats like Chicken and pork but not consistently.   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  Always use your most recent med list.               alfuzosin 10 MG 24 hr tablet  Commonly known as:  UROXATRAL  Take 10 mg by mouth daily.     amLODipine 10 MG tablet  Commonly known as:  NORVASC  Take 1 tablet (10 mg total) by mouth daily.     ELIQUIS 2.5 MG Tabs tablet  Generic drug:  apixaban  Take 2.5 mg by mouth. Takes 1 tablet once  a day     apixaban 5 MG Tabs tablet  Commonly known as:  ELIQUIS  Take 1 tablet (5 mg total) by mouth 2 (two) times daily.     atorvastatin 40 MG tablet  Commonly known as:  LIPITOR  TAKE 1 TABLET EVERY DAY     Cinnamon 500 MG capsule  Take 500 mg by mouth 2 (two) times daily.     finasteride 5 MG tablet  Commonly known as:  PROSCAR  Take 5 mg by mouth daily.     glucose blood test strip  Commonly known as:  FREESTYLE LITE  Use as instructed 2 times per day dx code E11.65     HUMULIN 70/30 KWIKPEN (70-30) 100 UNIT/ML PEN  Generic drug:  Insulin Isophane & Regular Human  INJECT 18 UNITS INTO THE SKIN EVERY MORNING & 22 UNITS AT SUPPER DAILY     levothyroxine 25 MCG tablet  Commonly known as:  SYNTHROID, LEVOTHROID  Take 1 tablet (25 mcg total) by mouth daily.     lisinopril 20 MG tablet  Commonly known as:  PRINIVIL,ZESTRIL  Take 1 tablet (20 mg total) by mouth daily.     metoprolol succinate 100 MG 24 hr tablet  Commonly known as:  TOPROL-XL   Take 1 tablet (100 mg total) by mouth daily. Take with or immediately following a meal.     nitroGLYCERIN 0.4 MG SL tablet  Commonly known as:  NITROSTAT  Place 1 tablet (0.4 mg total) under the tongue every 5 (five) minutes as needed for chest pain.     OVER THE COUNTER MEDICATION  Take 1 tablet by mouth daily. Mega Red Capsules     pioglitazone-metformin 15-500 MG per tablet  Commonly known as:  ACTOPLUS MET  1 tab at supper        Allergies:  Allergies  Allergen Reactions  . Cephalexin Nausea And Vomiting    Past Medical History  Diagnosis Date  . DM (diabetes mellitus)   . Hyperlipidemia   . Tobacco abuse   . CAD (coronary artery disease)     remote CABG in 1997; prior PCI to the LAD i n1990; Nuclear study in February of 2010 with an EF of 60% with repeat cath showing distal LAD disease. Managed medically  . 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 Atrial fibrillation dx 10/10/12    started on med 10/10/12  . Hypertension dx 10/10/12  . Thyroid disease dx 10/10/12    started on Synthroid  . History of radiation therapy 11/24/12-01/18/13    Prostate 78Gy/69f    Past Surgical History  Procedure Laterality Date  . Coronary artery bypass graft  11-1995    shows distal LAD disease, without recurrent symptoms of angina with questionable mild apical ischemia but with normal EF   . Angioplasty      of his left anterior descending previously in 1990.  . Cardiovascular stress test  12-2008    showed EF 60%  . Cataract extraction  2010    bilateral  . Knee surgery      left, arthroscopic  . Carpal tunnel release      right hand  . Esophagogastroduodenoscopy (egd) with esophageal dilation  05/2012    Family History  Problem Relation Age of Onset  . Stroke Father   . Heart attack Mother   . Cancer Sister     lung  . Cancer Brother     prostate  . Diabetes  Brother   . Stroke Brother   . Stroke Brother   . Heart disease Brother   . Heart disease  Brother   . Heart attack Sister   . Heart disease Sister   . Heart attack Sister   . Cancer Sister     breast  . Heart disease Sister   . Heart attack Sister   . Heart disease Sister   . Heart disease Sister     Social History:  reports that he quit smoking about 35 years ago. He quit smokeless tobacco use about 36 years ago. His smokeless tobacco use included Chew. He reports that he does not drink alcohol or use illicit drugs.  ROS   Eye exam last in 6/16   He has a diagnosis of hypothyroidism probably diagnosed in 2013 by cardiologist. He is supposed to be taking 25 g of levothyroxine, last TSH was upper normal  Lab Results  Component Value Date   TSH 4.25 02/25/2015   TSH 1.62 08/17/2014   TSH 4.876* 01/29/2014           HYPERTENSION:  Has been present for several years.    Currently taking lisinopril, amlodipine and metoprolol  HYPERLIPIDEMIA:         The lipid abnormality consists of elevated LDL and this is fairly well-controlled with 40 mg Lipitor, followed by cardiologist   Also has persistently low HDL   Lab Results  Component Value Date   CHOL 121 02/25/2015   HDL 38.90* 02/25/2015   LDLCALC 65 02/25/2015   TRIG 85.0 02/25/2015   CHOLHDL 3 02/25/2015    Diabetic foot exam done in 7/16 showed normal monofilament sensation and pulses   Examination:   BP 136/76 mmHg  Pulse 86  Temp(Src) 98 F (36.7 C)  Resp 16  Ht 6' (1.829 m)  Wt 208 lb 9.6 oz (94.62 kg)  BMI 28.28 kg/m2  SpO2 98%  Body mass index is 28.28 kg/(m^2).   No pedal edema  Diabetic foot exam shows normal monofilament sensation in the toes and plantar surfaces, no skin lesions or ulcers on the feet and normal pedal pulses Standing blood pressure 132/74  Assesment/PLAN:  1. Diabetes type 2 with BMI 29   He is an A1c of 6.9 but his home blood sugars are erratic with few readings over 200 in the evenings and occasionally after breakfast also See history of present illness for  detailed discussion of his current management, blood sugar patterns and problems identified  Most likely is not understanding the need to take his insulin 15-30 minutes before eating and also to check his blood sugars about 2 hours after eating He has lost weight, most likely has cut down on portions and is more active this summer Diet: This may be sometimes higher in fat and sometimes eating cereal in the morning   For his insulin resistance and low HDL will start him on Actoplusmet low-dose  To continue 70/30 insulin unchanged for now but change the timing of the injection  To check blood sugars only before eating or 2 hours later  Discussed timing of insulin to be taken at least 15 minutes before eating    Discussed these instructions with the patient in detail and showed him the printout  Discussed general principles of foot care  2. History of dyslipidemia treated with Lipitor 40 mg Has history of CAD and LDL is below 100  3.  Hypertension: Well controlled  4.  Mild hypothyroidism: We will check  his TSH again at least twice a year.  Encouraged him to establish with a PCP  Counseling time on subjects discussed above is over 50% of today's 25 minute visit   Brendt Dible 06/06/2015, 2:33 PM

## 2015-06-28 ENCOUNTER — Ambulatory Visit (INDEPENDENT_AMBULATORY_CARE_PROVIDER_SITE_OTHER): Payer: Medicare Other | Admitting: Cardiovascular Disease

## 2015-06-28 ENCOUNTER — Encounter: Payer: Self-pay | Admitting: Cardiovascular Disease

## 2015-06-28 VITALS — BP 148/80 | HR 80 | Ht 72.0 in | Wt 207.0 lb

## 2015-06-28 DIAGNOSIS — L03111 Cellulitis of right axilla: Secondary | ICD-10-CM

## 2015-06-28 DIAGNOSIS — I482 Chronic atrial fibrillation, unspecified: Secondary | ICD-10-CM

## 2015-06-28 DIAGNOSIS — I25810 Atherosclerosis of coronary artery bypass graft(s) without angina pectoris: Secondary | ICD-10-CM | POA: Diagnosis not present

## 2015-06-28 DIAGNOSIS — I10 Essential (primary) hypertension: Secondary | ICD-10-CM

## 2015-06-28 DIAGNOSIS — E785 Hyperlipidemia, unspecified: Secondary | ICD-10-CM | POA: Diagnosis not present

## 2015-06-28 MED ORDER — LISINOPRIL 40 MG PO TABS: 40.0000 mg | ORAL_TABLET | Freq: Every day | ORAL | Status: DC

## 2015-06-28 MED ORDER — DOXYCYCLINE HYCLATE 100 MG PO CAPS: 100.0000 mg | ORAL_CAPSULE | Freq: Two times a day (BID) | ORAL | Status: DC

## 2015-06-28 NOTE — Patient Instructions (Addendum)
Your physician wants you to follow-up in: 6 months You will receive a reminder letter in the mail two months in advance. If you don't receive a letter, please call our office to schedule the follow-up appointment.    INCREASE Lisinopril to 40 mg daily    Take Doxycycline 100 mg twice a day for 7 days     Thank you for choosing [facility] !

## 2015-06-28 NOTE — Progress Notes (Signed)
Patient ID: Steve Bailey, male   DOB: 1934/03/24, 81 y.o.   MRN: [id number]      SUBJECTIVE: Steve Bailey presents for routine follow-up. He has a history of coronary artery disease and coronary artery bypass graft surgery, with his most recent cardiac catheterization performed in 2010. He also has hypertension, hyperlipidemia, IDDM, and atrial fibrillation. He is noted to be noncompliant with his medication regimen.  ECG performed in the office today demonstrates atrial fibrillation, heart rate 83 bpm, with a diffuse nonspecific T wave abnormality.  Says he is taking all his medications as prescribed. Denies chest pain, shortness of breath, and leg swelling, as well as palpitations. He feels somewhat dizzy when he first stands up in the morning but denies syncope. His primary complaint relates to a boil in his right axilla.  Denies bleeding problems on Eliquis and affirms he is taking 5 mg bid.  Review of Systems: As per "subjective", otherwise negative.  Allergies  Allergen Reactions  . Cephalexin Nausea And Vomiting    Current Outpatient Prescriptions  Medication Sig Dispense Refill  . alfuzosin (UROXATRAL) 10 MG 24 hr tablet Take 10 mg by mouth daily.    Marland Kitchen amLODipine (NORVASC) 10 MG tablet Take 1 tablet (10 mg total) by mouth daily. 90 tablet 3  . apixaban (ELIQUIS) 5 MG TABS tablet Take 1 tablet (5 mg total) by mouth 2 (two) times daily. 60 tablet 6  . atorvastatin (LIPITOR) 40 MG tablet TAKE 1 TABLET EVERY DAY 90 tablet 3  . Cinnamon 500 MG capsule Take 500 mg by mouth 2 (two) times daily.    . finasteride (PROSCAR) 5 MG tablet Take 5 mg by mouth daily.    Marland Kitchen glucose blood (FREESTYLE LITE) test strip Use as instructed 2 times per day dx code E11.65 100 each 3  . HUMULIN 70/30 KWIKPEN (70-30) 100 UNIT/ML PEN INJECT 18 UNITS INTO THE SKIN EVERY MORNING & 22 UNITS AT SUPPER DAILY (Patient taking differently: INJECT 20 UNITS INTO THE SKIN EVERY MORNING & 20 UNITS AT SUPPER DAILY) 45 pen 0    . levothyroxine (SYNTHROID, LEVOTHROID) 25 MCG tablet Take 1 tablet (25 mcg total) by mouth daily. 30 tablet 11  . lisinopril (PRINIVIL,ZESTRIL) 20 MG tablet Take 1 tablet (20 mg total) by mouth daily. 30 tablet 1  . metoprolol succinate (TOPROL-XL) 100 MG 24 hr tablet Take 1 tablet (100 mg total) by mouth daily. Take with or immediately following a meal. 90 tablet 1  . nitroGLYCERIN (NITROSTAT) 0.4 MG SL tablet Place 1 tablet (0.4 mg total) under the tongue every 5 (five) minutes as needed for chest pain. 25 tablet 6  . OVER THE COUNTER MEDICATION Take 1 tablet by mouth daily. Mega Red Capsules    . pioglitazone-metformin (ACTOPLUS MET) 15-500 MG per tablet 1 tab at supper 30 tablet 3   No current facility-administered medications for this visit.    Past Medical History  Diagnosis Date  . DM (diabetes mellitus)   . Hyperlipidemia   . Tobacco abuse   . CAD (coronary artery disease)     remote CABG in 1997; prior PCI to the LAD i n1990; Nuclear study in February of 2010 with an EF of 60% with repeat cath showing distal LAD disease. Managed medically  . 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 Atrial fibrillation dx 10/10/12    started on med 10/10/12  . Hypertension dx 10/10/12  . Thyroid disease dx 10/10/12  started on Synthroid  . History of radiation therapy 11/24/12-01/18/13    Prostate 78Gy/63f    Past Surgical History  Procedure Laterality Date  . Coronary artery bypass graft  11-1995    shows distal LAD disease, without recurrent symptoms of angina with questionable mild apical ischemia but with normal EF   . Angioplasty      of his left anterior descending previously in 1990.  . Cardiovascular stress test  12-2008    showed EF 60%  . Cataract extraction  2010    bilateral  . Knee surgery      left, arthroscopic  . Carpal tunnel release      right hand  . Esophagogastroduodenoscopy (egd) with esophageal dilation  05/2012    History   Social History   . Marital Status: Married    Spouse Name: N/A  . Number of Children: N/A  . Years of Education: N/A   Occupational History  . Not on file.   Social History Main Topics  . Smoking status: Former Smoker    Quit date: 10/14/1979  . Smokeless tobacco: Former [employer] developer   Types: [place]: 11/23/1978  . Alcohol Use: No  . Drug Use: No  . Sexual Activity: No   Other Topics Concern  . Not on file   Social History Narrative     Filed Vitals:   06/28/15 0850  BP: 148/80  Pulse: 103  Height: 6' (1.829 m)  Weight: 207 lb (93.895 kg)  SpO2: 99%    PHYSICAL EXAM General: NAD HEENT: Normal. Neck: No JVD, no thyromegaly. Lungs: Clear to auscultation bilaterally with normal respiratory effort. CV: Regular rate and irregular rhythm, normal S1/S2, no S3, no murmur. No pretibial or periankle edema. No carotid bruit. Axilla: Erythematous lesion in right axilla. Non purulent. No warmth. Abdomen: Soft, nontender, no distention.  Neurologic: Alert and oriented.  Psych: Normal affect. Skin: Scabs on b/l pretibial regions. Musculoskeletal: No gross deformities. Extremities: No clubbing or cyanosis.   ECG: Most recent ECG reviewed.      ASSESSMENT AND PLAN: 1. CAD s/p CABG: Symptomatically stable. Continue Lipitor and metoprolol. As he is on Eliquis, I am avoiding ASA.  2. Essential HTN: Mildly elevated today. Increase lisinopril to 40 mg and continue amlodipine 10 mg.  3. Hyperlipidemia: Lipids on 06/06/15 showed TC 133, TG 75, HDL 44, LDL 73. Continue Lipitor 40 mg.  4. Atrial fibrillation: HR controlled on Toprol-XL 100 mg daily. Anticoagulated with Eliquis. No changes.  5. Right axillary boil/cellulitis: Will treat with doxycycline 100 mg bid x 7 days.  Dispo: f/u 6 months with NP.  SKate Sable M.D., F.A.C.C.

## 2015-07-14 ENCOUNTER — Other Ambulatory Visit: Payer: Self-pay | Admitting: Cardiovascular Disease

## 2015-07-24 ENCOUNTER — Other Ambulatory Visit: Payer: Self-pay | Admitting: *Deleted

## 2015-07-24 DIAGNOSIS — I10 Essential (primary) hypertension: Secondary | ICD-10-CM

## 2015-07-24 MED ORDER — ATORVASTATIN CALCIUM 40 MG PO TABS: ORAL_TABLET | ORAL | Status: DC

## 2015-08-08 ENCOUNTER — Other Ambulatory Visit: Payer: Self-pay | Admitting: *Deleted

## 2015-08-08 DIAGNOSIS — E1165 Type 2 diabetes mellitus with hyperglycemia: Secondary | ICD-10-CM

## 2015-08-08 DIAGNOSIS — IMO0002 Reserved for concepts with insufficient information to code with codable children: Secondary | ICD-10-CM

## 2015-08-08 MED ORDER — PIOGLITAZONE HCL-METFORMIN HCL 15-500 MG PO TABS: ORAL_TABLET | ORAL | Status: DC

## 2015-08-16 ENCOUNTER — Other Ambulatory Visit: Payer: Self-pay

## 2015-08-16 MED ORDER — APIXABAN 5 MG PO TABS: 5.0000 mg | ORAL_TABLET | Freq: Two times a day (BID) | ORAL | Status: DC

## 2015-08-27 DIAGNOSIS — Z23 Encounter for immunization: Secondary | ICD-10-CM | POA: Diagnosis not present

## 2015-09-06 ENCOUNTER — Ambulatory Visit (INDEPENDENT_AMBULATORY_CARE_PROVIDER_SITE_OTHER): Payer: Medicare Other | Admitting: Endocrinology

## 2015-09-06 ENCOUNTER — Encounter: Payer: Self-pay | Admitting: Endocrinology

## 2015-09-06 VITALS — BP 136/64 | HR 91 | Temp 98.0°F | Resp 14 | Ht 72.0 in | Wt 212.2 lb

## 2015-09-06 DIAGNOSIS — E038 Other specified hypothyroidism: Secondary | ICD-10-CM | POA: Diagnosis not present

## 2015-09-06 DIAGNOSIS — E538 Deficiency of other specified B group vitamins: Secondary | ICD-10-CM | POA: Diagnosis not present

## 2015-09-06 DIAGNOSIS — E7800 Pure hypercholesterolemia, unspecified: Secondary | ICD-10-CM | POA: Diagnosis not present

## 2015-09-06 DIAGNOSIS — E1165 Type 2 diabetes mellitus with hyperglycemia: Secondary | ICD-10-CM

## 2015-09-06 DIAGNOSIS — IMO0002 Reserved for concepts with insufficient information to code with codable children: Secondary | ICD-10-CM

## 2015-09-06 DIAGNOSIS — I25810 Atherosclerosis of coronary artery bypass graft(s) without angina pectoris: Secondary | ICD-10-CM | POA: Diagnosis not present

## 2015-09-06 LAB — COMPREHENSIVE METABOLIC PANEL
ALT: 14 U/L (ref 0–53)
AST: 17 U/L (ref 0–37)
Albumin: 4 g/dL (ref 3.5–5.2)
Alkaline Phosphatase: 67 U/L (ref 39–117)
BUN: 20 mg/dL (ref 6–23)
CO2: 25 mEq/L (ref 19–32)
Calcium: 9 mg/dL (ref 8.4–10.5)
Chloride: 105 mEq/L (ref 96–112)
Creatinine, Ser: 1.19 mg/dL (ref 0.40–1.50)
GFR: 62.62 mL/min (ref >60.00)
Glucose, Bld: 132 mg/dL — ABNORMAL HIGH (ref 70–99)
Potassium: 3.8 mEq/L (ref 3.5–5.1)
Sodium: 139 mEq/L (ref 135–145)
Total Bilirubin: 0.9 mg/dL (ref 0.2–1.2)
Total Protein: 6.9 g/dL (ref 6.0–8.3)

## 2015-09-06 LAB — CBC
HCT: 37.4 % — ABNORMAL LOW (ref 39.0–52.0)
Hemoglobin: 12.6 g/dL — ABNORMAL LOW (ref 13.0–17.0)
MCHC: 33.7 g/dL (ref 30.0–36.0)
MCV: 97.1 fl (ref 78.0–100.0)
Platelets: 158 10*3/uL (ref 150.0–400.0)
RBC: 3.85 Mil/uL — ABNORMAL LOW (ref 4.22–5.81)
RDW: 14.9 % (ref 11.5–15.5)
WBC: 4.1 10*3/uL (ref 4.0–10.5)

## 2015-09-06 LAB — MICROALBUMIN / CREATININE URINE RATIO
Creatinine,U: 237.4 mg/dL
Microalb Creat Ratio: 3.9 mg/g (ref 0.0–30.0)
Microalb, Ur: 9.2 mg/dL — ABNORMAL HIGH (ref 0.0–1.9)

## 2015-09-06 LAB — LIPID PANEL
Cholesterol: 121 mg/dL (ref 0–200)
HDL: 47.7 mg/dL (ref >39.00)
LDL Cholesterol: 60 mg/dL (ref 0–99)
NonHDL: 72.98
Total CHOL/HDL Ratio: 3
Triglycerides: 67 mg/dL (ref 0.0–149.0)
VLDL: 13.4 mg/dL (ref 0.0–40.0)

## 2015-09-06 LAB — POCT GLYCOSYLATED HEMOGLOBIN (HGB A1C): Hemoglobin A1C: 6.1

## 2015-09-06 LAB — TSH: TSH: 2.42 u[IU]/mL (ref 0.35–4.50)

## 2015-09-06 LAB — T4, FREE: Free T4: 1.05 ng/dL (ref 0.60–1.60)

## 2015-09-06 NOTE — Progress Notes (Signed)
Patient ID: Steve Bailey, male   DOB: October 25, 1934, 81 y.o.   MRN: [id number]    Reason for Appointment: Diabetes follow-up   History of Present Illness   Diagnosis: Type 2 DIABETES MELITUS, date of diagnosis: 1995          Oral hypoglycemic drugs: None      Insulin regimen: Humulin 70/30,  20 units   a.c. twice a day with pens       History:  Although his blood sugars have been usually well controlled over the last few years with A1c upper normal his A1c is  relatively higher since 2015 Previously was taking Actos for insulin resistance and apparently this was stopped by cardiologist but has had no history of LV dysfunction.  He also is refusing to take this because of news media items he had previously heard  He has brought his blood sugar monitor now for download again  A1c appears to be better on this visit probably because of improved timing of insulin, more activity this summer  Current blood sugar patterns and problems:  He is checking his blood sugars somewhat randomly and not clear how often he is checking them after meals are around how long after  He was told to take his insulin before eating and he is usually trying to do this 10-20 minutes before eating no instead of after  Apparently he has  been a little more active overall recently; however although he had lost weight this is back up a little   He does not think he has unusually high readings now Not on any oral hypoglycemic drugs  Glucometer:  Freestyle Blood Glucose readings from recall:  95-135  Hypoglycemia frequency: None . He  will take a sweet drink when his blood sugar feels low   Carbohydrate intake: at meals: Mostly with vegetables, also getting some lean meats like Chicken and pork but not consistently.   Egg, bacon, grits in am  Meals may be occasionally higher in fat.  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  Always use your most recent med list.               alfuzosin 10 MG 24 hr tablet  Commonly known as:  UROXATRAL  Take 10 mg by mouth daily.     amLODipine 10 MG tablet  Commonly known as:  NORVASC  Take 1 tablet (10 mg total) by mouth daily.     apixaban 5 MG Tabs tablet  Commonly known as:  ELIQUIS  Take 1 tablet (5 mg total) by mouth 2 (two) times daily.     atorvastatin 40 MG tablet  Commonly known as:  LIPITOR  TAKE 1 TABLET EVERY DAY     Cinnamon 500 MG capsule  Take 500 mg by mouth 2 (two)  times daily.     doxycycline 100 MG capsule  Commonly known as:  VIBRAMYCIN  Take 1 capsule (100 mg total) by mouth 2 (two) times daily.     finasteride 5 MG tablet  Commonly known as:  PROSCAR  Take 5 mg by mouth daily.     glucose blood test strip  Commonly known as:  FREESTYLE LITE  Use as instructed 2 times per day dx code E11.65     HUMULIN 70/30 KWIKPEN (70-30) 100 UNIT/ML PEN  Generic drug:  Insulin Isophane & Regular Human  INJECT 18 UNITS INTO THE SKIN EVERY MORNING & 22 UNITS AT SUPPER DAILY     levothyroxine 25 MCG tablet  Commonly known as:  SYNTHROID, LEVOTHROID  Take 1 tablet (25 mcg total) by mouth daily.     lisinopril 40 MG tablet  Commonly known as:  PRINIVIL,ZESTRIL  Take 1 tablet (40 mg total) by mouth daily.     metoprolol succinate 100 MG 24 hr tablet  Commonly known as:  TOPROL-XL  Take 1 tablet (100 mg total) by mouth daily. Take with or immediately following a meal.     nitroGLYCERIN 0.4  MG SL tablet  Commonly known as:  NITROSTAT  Place 1 tablet (0.4 mg total) under the tongue every 5 (five) minutes as needed for chest pain.     OVER THE COUNTER MEDICATION  Take 1 tablet by mouth daily. Mega Red Capsules     pioglitazone-metformin 15-500 MG tablet  Commonly known as:  ACTOPLUS MET  1 tab at supper        Allergies:  Allergies  Allergen Reactions  . Cephalexin Nausea And Vomiting    Past Medical History  Diagnosis Date  . DM (diabetes mellitus) ([place])   . Hyperlipidemia   . Tobacco abuse   . CAD (coronary artery disease)     remote CABG in 1997; prior PCI to the LAD i n1990; Nuclear study in February of 2010 with an EF of 60% with repeat cath showing distal LAD disease. Managed medically  . Prostate cancer ([place]) 09/02/12    Gleason 3+4=7, vol 103 cc  . Atrial fibrillation ([place]) dx 10/10/12    started on med 10/10/12  . Hypertension dx 10/10/12  . Thyroid disease dx 10/10/12    started on Synthroid  . History of radiation therapy 11/24/12-01/18/13    Prostate 78Gy/87f    Past Surgical History  Procedure Laterality Date  . Coronary artery bypass graft  11-1995    shows distal LAD disease, without recurrent symptoms of angina with questionable mild apical ischemia but with normal EF   . Angioplasty      of his left anterior descending previously in 1990.  . Cardiovascular stress test  12-2008    showed EF 60%  . Cataract extraction  2010    bilateral  . Knee surgery      left, arthroscopic  . Carpal tunnel release      right hand  . Esophagogastroduodenoscopy (egd) with esophageal dilation  05/2012    Family History  Problem Relation Age of Onset  . Stroke Father   . Heart attack Mother   . Cancer Sister     lung  . Cancer Brother     prostate  . Diabetes Brother   . Stroke Brother   . Stroke Brother   . Heart disease Brother   . Heart disease Brother   . Heart attack Sister   . Heart disease Sister   .  Heart attack Sister   . Cancer  Sister     breast  . Heart disease Sister   . Heart attack Sister   . Heart disease Sister   . Heart disease Sister     Social History:  reports that he quit smoking about 35 years ago. He quit smokeless tobacco use about 36 years ago. His smokeless tobacco use included Chew. He reports that he does not drink alcohol or use illicit drugs.  ROS   Eye exam last in 6/16   He has a diagnosis of hypothyroidism probably diagnosed in 2013 by cardiologist. He is still on 25 g of levothyroxine, last TSH normal   Lab Results  Component Value Date   FREET4 1.05 09/06/2015   TSH 2.42 09/06/2015   TSH 4.25 02/25/2015   TSH 1.62 08/17/2014           HYPERTENSION:  Has been present for several years.    Currently taking lisinopril 40 mg, amlodipine and metoprolol  HYPERLIPIDEMIA:    he has had high LDL and this is fairly well-controlled with 40 mg Lipitor, followed by cardiologist   Also has persistently low HDL   Lab Results  Component Value Date   CHOL 121 09/06/2015   HDL 47.70 09/06/2015   LDLCALC 60 09/06/2015   TRIG 67.0 09/06/2015   CHOLHDL 3 09/06/2015    Diabetic foot exam done in 7/16 showed normal monofilament sensation and pulses   Examination:   BP 136/64 mmHg  Pulse 91  Temp(Src) 98 F (36.7 C)  Resp 14  Ht 6' (1.829 m)  Wt 212 lb 3.2 oz (96.253 kg)  BMI 28.77 kg/m2  SpO2 97%  Body mass index is 28.77 kg/(m^2).     Assesment/PLAN:  1. Diabetes type 2 with BMI 29   He appears to have better controlled with A1c now 6.9 See history of present illness for detailed discussion of his current management, blood sugar patterns and problems identified He is doing a little better with timing his insulin before eating instead of after and probably has better postprandial control Also he has been more active this summer and this may be helping also along with the wording some of the higher fat foods otherwise  He was recommended Actoplusmet previously but he  refuses to take this for fear of side effects from Actos even though he was reassured about this.  Since his control is fairly good with low-dose insulin will continue this Reminded him to bring his monitor on next visit    2. History of dyslipidemia treated with Lipitor 40 mg Has history of CAD and LDL is below70  3.  Hypertension: Well controlled  4.  Mild hypothyroidism, Probably symptomatic: TSH normal on 25 g  Encouraged him to establish with a PCP    [facility] 09/08/2015, 3:42 PM

## 2015-09-10 DIAGNOSIS — C61 Malignant neoplasm of prostate: Secondary | ICD-10-CM | POA: Diagnosis not present

## 2015-09-19 ENCOUNTER — Other Ambulatory Visit: Payer: Self-pay | Admitting: Urology

## 2015-09-19 DIAGNOSIS — R3912 Poor urinary stream: Secondary | ICD-10-CM | POA: Diagnosis not present

## 2015-09-19 DIAGNOSIS — C61 Malignant neoplasm of prostate: Secondary | ICD-10-CM | POA: Diagnosis not present

## 2015-09-19 DIAGNOSIS — N401 Enlarged prostate with lower urinary tract symptoms: Secondary | ICD-10-CM | POA: Diagnosis not present

## 2015-10-16 ENCOUNTER — Other Ambulatory Visit: Payer: Self-pay | Admitting: Endocrinology

## 2015-10-21 ENCOUNTER — Other Ambulatory Visit: Payer: Self-pay | Admitting: *Deleted

## 2015-10-21 MED ORDER — AMLODIPINE BESYLATE 10 MG PO TABS: 10.0000 mg | ORAL_TABLET | Freq: Every day | ORAL | Status: DC

## 2015-10-31 ENCOUNTER — Encounter (HOSPITAL_COMMUNITY)
Admission: RE | Admit: 2015-10-31 | Discharge: 2015-10-31 | Disposition: A | Discharge status: Home / Self Care | Payer: Medicare Other | Source: Ambulatory Visit | Attending: Urology | Admitting: Urology

## 2015-10-31 ENCOUNTER — Ambulatory Visit (HOSPITAL_COMMUNITY): Admission: RE | Admit: 2015-10-31 | Payer: Medicare Other | Source: Ambulatory Visit

## 2015-10-31 DIAGNOSIS — C61 Malignant neoplasm of prostate: Secondary | ICD-10-CM | POA: Insufficient documentation

## 2015-10-31 DIAGNOSIS — N400 Enlarged prostate without lower urinary tract symptoms: Secondary | ICD-10-CM | POA: Diagnosis not present

## 2015-10-31 MED ORDER — TECHNETIUM TC 99M MEDRONATE IV KIT
22.5000 | PACK | Freq: Once | INTRAVENOUS | Status: AC | PRN
  Administered 2015-10-31: 22.5 via INTRAVENOUS

## 2015-11-19 DIAGNOSIS — N401 Enlarged prostate with lower urinary tract symptoms: Secondary | ICD-10-CM | POA: Diagnosis not present

## 2015-11-19 DIAGNOSIS — C61 Malignant neoplasm of prostate: Secondary | ICD-10-CM | POA: Diagnosis not present

## 2015-11-19 DIAGNOSIS — I714 Abdominal aortic aneurysm, without rupture: Secondary | ICD-10-CM | POA: Diagnosis not present

## 2015-11-19 DIAGNOSIS — N138 Other obstructive and reflux uropathy: Secondary | ICD-10-CM | POA: Diagnosis not present

## 2015-11-29 ENCOUNTER — Encounter: Payer: Self-pay | Admitting: Surgery

## 2015-12-03 ENCOUNTER — Other Ambulatory Visit: Payer: Medicare Other

## 2015-12-03 ENCOUNTER — Other Ambulatory Visit (INDEPENDENT_AMBULATORY_CARE_PROVIDER_SITE_OTHER): Payer: Medicare Other

## 2015-12-03 DIAGNOSIS — E1165 Type 2 diabetes mellitus with hyperglycemia: Secondary | ICD-10-CM | POA: Diagnosis not present

## 2015-12-03 LAB — COMPREHENSIVE METABOLIC PANEL
ALT: 13 U/L (ref 0–53)
AST: 18 U/L (ref 0–37)
Albumin: 4.2 g/dL (ref 3.5–5.2)
Alkaline Phosphatase: 73 U/L (ref 39–117)
BUN: 22 mg/dL (ref 6–23)
CO2: 27 mEq/L (ref 19–32)
Calcium: 9.2 mg/dL (ref 8.4–10.5)
Chloride: 104 mEq/L (ref 96–112)
Creatinine, Ser: 1.03 mg/dL (ref 0.40–1.50)
GFR: 73.93 mL/min (ref >60.00)
Glucose, Bld: 101 mg/dL — ABNORMAL HIGH (ref 70–99)
Potassium: 4.3 mEq/L (ref 3.5–5.1)
Sodium: 139 mEq/L (ref 135–145)
Total Bilirubin: 0.8 mg/dL (ref 0.2–1.2)
Total Protein: 6.8 g/dL (ref 6.0–8.3)

## 2015-12-06 ENCOUNTER — Ambulatory Visit (INDEPENDENT_AMBULATORY_CARE_PROVIDER_SITE_OTHER): Payer: Medicare Other | Admitting: Endocrinology

## 2015-12-06 ENCOUNTER — Encounter: Payer: Self-pay | Admitting: Endocrinology

## 2015-12-06 VITALS — BP 130/72 | Temp 98.6°F | Ht 72.0 in | Wt 206.4 lb

## 2015-12-06 DIAGNOSIS — E119 Type 2 diabetes mellitus without complications: Secondary | ICD-10-CM | POA: Diagnosis not present

## 2015-12-06 LAB — POCT GLYCOSYLATED HEMOGLOBIN (HGB A1C): Hemoglobin A1C: 5.8

## 2015-12-06 MED ORDER — METFORMIN HCL ER 750 MG PO TB24: 750.0000 mg | ORAL_TABLET | Freq: Every day | ORAL | Status: DC

## 2015-12-06 NOTE — Progress Notes (Signed)
Patient ID: Steve Bailey, male   DOB: Dec 23, 1933, 82 y.o.   MRN: [id number]    Reason for Appointment: Diabetes follow-up   History of Present Illness   Diagnosis: Type 2 DIABETES MELITUS, date of diagnosis: 1995          Oral hypoglycemic drugs: None      Insulin regimen: Humulin 70/30,  20 units   a.c. twice a day with pens       History:  His blood sugars have been usually well controlled over the last few years with A1c upper normal Previously was taking Actos for insulin resistance and he does not think he is taking this even though he was taking ACTOplusmet last year.  He is still concerned about the discussion on news media about this His A1c had gone up to 6.9 previously but appears improved now He has brought his blood sugar monitor now for download   A1c appears to be better on this visit probably because of improved timing of insulin, more activity this summer  Current blood sugar patterns and problems:  He is checking his blood sugars somewhat irregularly and infrequently  Fasting blood sugars are usually fairly good.  He has only 3 recent reading  Has one reading after breakfast and another after dinner which look fairly good  Afternoon blood sugar was 124  He was told to take his insulin before eating and he is doing this more consistently now  His weight is slightly better, usually moderating his portions  He usually not active in winter  Glucometer:  Freestyle Blood Glucose readings from download show average 138, range 119-165, highest reading after supper  Hypoglycemia frequency: None . He  will take a sweet drink when his blood sugar feels low   Carbohydrate intake: at meals: Mostly with vegetables, also getting some lean meats like Chicken and pork but not consistently.   Egg, bacon, grits in am  Meals may be occasionally higher in fat.  Variable snacks at bedtime  Physical activity: [place] or trying to walk   Weight  history:  Wt Readings from Last 3 Encounters:  12/06/15 206 lb 6.4 oz (93.622 kg)  09/06/15 212 lb 3.2 oz (96.253 kg)  06/28/15 207 lb (93.895 kg)     Lab Results  Component Value Date   HGBA1C 6.1 09/06/2015   HGBA1C 6.9 06/06/2015   HGBA1C 6.9* 06/06/2015   Lab Results  Component Value Date   MICROALBUR 9.2* 09/06/2015   LDLCALC 60 09/06/2015   CREATININE 1.03 12/03/2015          Medication List       This list is accurate as of: 12/06/15  9:01 AM.  Always use your most recent med list.               alfuzosin 10 MG 24 hr tablet  Commonly known as:  UROXATRAL  Take 10 mg by mouth daily.     amLODipine 10 MG tablet  Commonly known as:  NORVASC  Take 1 tablet (10 mg total) by mouth daily.     apixaban 5 MG Tabs tablet  Commonly known as:  ELIQUIS  Take 1 tablet (5 mg total) by mouth 2 (two) times daily.     atorvastatin 40 MG tablet  Commonly known as:  LIPITOR  TAKE 1 TABLET EVERY DAY     bicalutamide 50 MG tablet  Commonly known as:  CASODEX  TAKE 1 TABLET BY MOUTH  DAILY X 4 WEEKS TO PREVENT "ANDROGEN FLAIR"     Cinnamon 500 MG capsule  Take 500 mg by mouth 2 (two) times daily.     doxycycline 100 MG capsule  Commonly known as:  VIBRAMYCIN  Take 1 capsule (100 mg total) by mouth 2 (two) times daily.     finasteride 5 MG tablet  Commonly known as:  PROSCAR  Take 5 mg by mouth daily.     glucose blood test strip  Commonly known as:  FREESTYLE LITE  Use as instructed 2 times per day dx code E11.65     HUMULIN 70/30 KWIKPEN (70-30) 100 UNIT/ML PEN  Generic drug:  Insulin Isophane & Regular Human  INJECT 18 UNITS INTO THE SKIN EVERY MORNING & 22 UNITS AT SUPPER DAILY - NEEDS APPT     levothyroxine 25 MCG tablet  Commonly known as:  SYNTHROID, LEVOTHROID  Take 1 tablet (25 mcg total) by mouth daily.     lisinopril 40 MG tablet  Commonly known as:  PRINIVIL,ZESTRIL  Take 1 tablet (40 mg total) by mouth daily.     metoprolol succinate 100 MG  24 hr tablet  Commonly known as:  TOPROL-XL  Take 1 tablet (100 mg total) by mouth daily. Take with or immediately following a meal.     nitroGLYCERIN 0.4 MG SL tablet  Commonly known as:  NITROSTAT  Place 1 tablet (0.4 mg total) under the tongue every 5 (five) minutes as needed for chest pain.     OVER THE COUNTER MEDICATION  Take 1 tablet by mouth daily. Mega Red Capsules     pioglitazone-metformin 15-500 MG tablet  Commonly known as:  ACTOPLUS MET  1 tab at supper        Allergies:  Allergies  Allergen Reactions  . Cephalexin Nausea And Vomiting    Past Medical History  Diagnosis Date  . DM (diabetes mellitus) ([place])   . Hyperlipidemia   . Tobacco abuse   . CAD (coronary artery disease)     remote CABG in 1997; prior PCI to the LAD i n1990; Nuclear study in February of 2010 with an EF of 60% with repeat cath showing distal LAD disease. Managed medically  . Prostate cancer ([place]) 09/02/12    Gleason 3+4=7, vol 103 cc  . Atrial fibrillation ([place]) dx 10/10/12    started on med 10/10/12  . Hypertension dx 10/10/12  . Thyroid disease dx 10/10/12    started on Synthroid  . History of radiation therapy 11/24/12-01/18/13    Prostate 78Gy/33f    Past Surgical History  Procedure Laterality Date  . Coronary artery bypass graft  11-1995    shows distal LAD disease, without recurrent symptoms of angina with questionable mild apical ischemia but with normal EF   . Angioplasty      of his left anterior descending previously in 1990.  . Cardiovascular stress test  12-2008    showed EF 60%  . Cataract extraction  2010    bilateral  . Knee surgery      left, arthroscopic  . Carpal tunnel release      right hand  . Esophagogastroduodenoscopy (egd) with esophageal dilation  05/2012    Family History  Problem Relation Age of Onset  . Stroke Father   . Heart attack Mother   . Cancer Sister     lung  . Cancer Brother     prostate  . Diabetes Brother   . Stroke Brother   .  Stroke Brother   . Heart disease Brother   . Heart disease Brother   . Heart attack Sister   . Heart disease Sister   . Heart attack Sister   . Cancer Sister     breast  . Heart disease Sister   . Heart attack Sister   . Heart disease Sister   . Heart disease Sister     Social History:  reports that he quit smoking about 36 years ago. He quit smokeless tobacco use about 37 years ago. His smokeless tobacco use included Chew. He reports that he does not drink alcohol or use illicit drugs.  ROS   Eye exam last in 6/16   He has a diagnosis of hypothyroidism probably diagnosed in 2013 by cardiologist. He is still on 25 g of levothyroxine, last TSH normal   Lab Results  Component Value Date   FREET4 1.05 09/06/2015   TSH 2.42 09/06/2015   TSH 4.25 02/25/2015   TSH 1.62 08/17/2014           HYPERTENSION:  Has been present for several years.    Currently taking lisinopril 40 mg, amlodipine and metoprolol.  Also followed by cardiologist  He was told by his urologist that he has an aneurysm in his abdomen and is due to see a vascular surgeon  HYPERLIPIDEMIA:    he has had high LDL and this is fairly well-controlled with 40 mg Lipitor, followed by cardiologist   Also has persistently low HDL   Lab Results  Component Value Date   CHOL 121 09/06/2015   HDL 47.70 09/06/2015   LDLCALC 60 09/06/2015   TRIG 67.0 09/06/2015   CHOLHDL 3 09/06/2015    Diabetic foot exam done in 7/16 showed normal monofilament sensation and pulses   Examination:   BP 130/72 mmHg  Temp(Src) 98.6 F (37 C) (Oral)  Ht 6' (1.829 m)  Wt 206 lb 6.4 oz (93.622 kg)  BMI 27.99 kg/m2  Body mass index is 27.99 kg/(m^2).     Assesment/PLAN:  1. Diabetes type 2 with BMI 28   He appears to have better controlled with A1c now 6.1 See history of present illness for detailed discussion of his current management, blood sugar patterns and problems identified His control is fairly good with taking  premixed insulin again He is a little confused about whether he takes Actoplusmet or not Since he refuses to consider taking Actos will have him take metformin ER alone Advised him to check more readings after meals and reminded him to take insulin before eating   2.  Hypertension: Well controlled    Steve Bailey 12/06/2015, 9:01 AM

## 2015-12-06 NOTE — Progress Notes (Signed)
Pre visit review using our clinic review tool, if applicable. No additional management support is needed unless otherwise documented below in the visit note. 

## 2015-12-06 NOTE — Patient Instructions (Signed)
Check blood sugars on waking up 2-3 times a week Also check blood sugars about 2 hours after a meal and do this after different meals by rotation  Recommended blood sugar levels on waking up is 90-130 and about 2 hours after meal is 130-160  Please bring your blood sugar monitor to each visit, thank you  

## 2015-12-09 ENCOUNTER — Encounter: Payer: Medicare Other | Admitting: Surgery

## 2015-12-10 ENCOUNTER — Encounter: Payer: Self-pay | Admitting: Vascular Surgery

## 2015-12-10 DIAGNOSIS — Z5111 Encounter for antineoplastic chemotherapy: Secondary | ICD-10-CM | POA: Diagnosis not present

## 2015-12-10 DIAGNOSIS — C61 Malignant neoplasm of prostate: Secondary | ICD-10-CM | POA: Diagnosis not present

## 2015-12-18 ENCOUNTER — Encounter: Payer: Self-pay | Admitting: Vascular Surgery

## 2015-12-18 ENCOUNTER — Ambulatory Visit (INDEPENDENT_AMBULATORY_CARE_PROVIDER_SITE_OTHER): Payer: Medicare Other | Admitting: Vascular Surgery

## 2015-12-18 VITALS — BP 140/71 | HR 77 | Temp 97.0°F | Resp 16 | Ht 72.0 in | Wt 206.0 lb

## 2015-12-18 DIAGNOSIS — I714 Abdominal aortic aneurysm, without rupture, unspecified: Secondary | ICD-10-CM | POA: Insufficient documentation

## 2015-12-18 NOTE — Addendum Note (Signed)
Addended by: Mena Goes on: 12/18/2015 03:54 PM   Modules accepted: Orders

## 2015-12-18 NOTE — Progress Notes (Signed)
Referred by: Alexis Frock, MD [place], [place] [id number]  Reason for referral: AAA  History of Present Illness  The patient is a 82 y.o. (1934-05-27) male who presents with chief complaint: aneurysm.  This patient had a CT abd/pelvis for evaluation for any metastatic disease from prostate cancer.  Incidentally this CT demonstrated an AAA, measuring 4.5 cm.  The patient has chronic back pain.  He had a bone scan to look for any bone mets.  The patient notes no history of embolic episodes from the AAA.  The patient's risk factors for AAA included: age, male sex, and prior history of smoking.  The patient not smoke cigarettes anymore  Past Medical History  Diagnosis Date  . DM (diabetes mellitus) ([place])   . Hyperlipidemia   . Tobacco abuse   . CAD (coronary artery disease)     remote CABG in 1997; prior PCI to the LAD i n1990; Nuclear study in February of 2010 with an EF of 60% with repeat cath showing distal LAD disease. Managed medically  . Prostate cancer ([place]) 09/02/12    Gleason 3+4=7, vol 103 cc  . Atrial fibrillation ([place]) dx 10/10/12    started on med 10/10/12  . Hypertension dx 10/10/12  . Thyroid disease dx 10/10/12    started on Synthroid  . History of radiation therapy 11/24/12-01/18/13    Prostate 78Gy/41fx    Past Surgical History  Procedure Laterality Date  . Coronary artery bypass graft  11-1995    shows distal LAD disease, without recurrent symptoms of angina with questionable mild apical ischemia but with normal EF   . Angioplasty      of his left anterior descending previously in 1990.  . Cardiovascular stress test  12-2008    showed EF 60%  . Cataract extraction  2010    bilateral  . Knee surgery      left, arthroscopic  . Carpal tunnel release      right hand  . Esophagogastroduodenoscopy (egd) with esophageal dilation  05/2012    Social History   Social History  . Marital Status: Married    Spouse Name: N/A  . Number of Children: N/A  .  Years of Education: N/A   Occupational History  . Not on file.   Social History Main Topics  . Smoking status: Former Smoker    Quit date: 10/14/1979  . Smokeless tobacco: Former Systems developer    Types: [place] date: 11/23/1978  . Alcohol Use: No  . Drug Use: No  . Sexual Activity: No   Other Topics Concern  . Not on file   Social History Narrative    Family History  Problem Relation Age of Onset  . Stroke Father   . Heart attack Mother   . Cancer Sister     lung  . Cancer Brother     prostate  . Diabetes Brother   . Stroke Brother   . Stroke Brother   . Heart disease Brother   . Heart disease Brother   . Heart attack Sister   . Heart disease Sister   . Heart attack Sister   . Cancer Sister     breast  . Heart disease Sister   . Heart attack Sister   . Heart disease Sister   . Heart disease Sister     Current Outpatient Prescriptions  Medication Sig Dispense Refill  . apixaban (ELIQUIS) 5 MG TABS tablet Take 1 tablet (5  mg total) by mouth 2 (two) times daily. 60 tablet 6  . atorvastatin (LIPITOR) 40 MG tablet TAKE 1 TABLET EVERY DAY 90 tablet 3  . Cinnamon 500 MG capsule Take 500 mg by mouth 2 (two) times daily.    Marland Kitchen glucose blood (FREESTYLE LITE) test strip Use as instructed 2 times per day dx code E11.65 100 each 3  . HUMULIN 70/30 KWIKPEN (70-30) 100 UNIT/ML PEN INJECT 18 UNITS INTO THE SKIN EVERY MORNING & 22 UNITS AT SUPPER DAILY - NEEDS APPT 45 mL 0  . levothyroxine (SYNTHROID, LEVOTHROID) 25 MCG tablet Take 1 tablet (25 mcg total) by mouth daily. 30 tablet 11  . lisinopril (PRINIVIL,ZESTRIL) 40 MG tablet Take 1 tablet (40 mg total) by mouth daily. 90 tablet 3  . metoprolol succinate (TOPROL-XL) 100 MG 24 hr tablet Take 1 tablet (100 mg total) by mouth daily. Take with or immediately following a meal. 90 tablet 1  . nitroGLYCERIN (NITROSTAT) 0.4 MG SL tablet Place 1 tablet (0.4 mg total) under the tongue every 5 (five) minutes as needed for chest pain. 25  tablet 6  . OVER THE COUNTER MEDICATION Take 1 tablet by mouth daily. Mega Red Capsules    . alfuzosin (UROXATRAL) 10 MG 24 hr tablet Take 10 mg by mouth daily. Reported on 12/18/2015    . amLODipine (NORVASC) 10 MG tablet Take 1 tablet (10 mg total) by mouth daily. (Patient not taking: Reported on 12/18/2015) 90 tablet 0  . bicalutamide (CASODEX) 50 MG tablet Reported on 12/18/2015  0  . doxycycline (VIBRAMYCIN) 100 MG capsule Take 1 capsule (100 mg total) by mouth 2 (two) times daily. (Patient not taking: Reported on 12/18/2015) 14 capsule 0  . finasteride (PROSCAR) 5 MG tablet Take 5 mg by mouth daily. Reported on 12/18/2015    . metFORMIN (GLUCOPHAGE-XR) 750 MG 24 hr tablet Take 1 tablet (750 mg total) by mouth daily with breakfast. (Patient not taking: Reported on 12/18/2015) 90 tablet 3   No current facility-administered medications for this visit.     Allergies  Allergen Reactions  . Cephalexin Nausea And Vomiting     REVIEW OF SYSTEMS:  (Positives checked otherwise negative)  CARDIOVASCULAR:   [ ]  chest pain,  [ ]  chest pressure,  [ ]  palpitations,  [ ]  shortness of breath when laying flat,  [ ]  shortness of breath with exertion,   [ ]  pain in feet when walking,  [ ]  pain in feet when laying flat, [ ]  history of blood clot in veins (DVT),  [ ]  history of phlebitis,  [ ]  swelling in legs,  [ ]  varicose veins  PULMONARY:   [ ]  productive cough,  [ ]  asthma,  [ ]  wheezing  NEUROLOGIC:   [ ]  weakness in arms or legs,  [ ]  numbness in arms or legs,  [ ]  difficulty speaking or slurred speech,  [ ]  temporary loss of vision in one eye,  [ ]  dizziness  HEMATOLOGIC:   [ ]  bleeding problems,  [ ]  problems with blood clotting too easily  MUSCULOSKEL:   [ ]  joint pain, [ ]  joint swelling  GASTROINTEST:   [ ]  vomiting blood,  [ ]  blood in stool     GENITOURINARY:   [ ]  burning with urination,  [ ]  blood in urine  PSYCHIATRIC:   [ ]  history of major  depression  INTEGUMENTARY:   [ ]  rashes,  [ ]  ulcers  CONSTITUTIONAL:   [ ]  fever,  [ ]   chills   For VQI Use Only  PRE-ADM LIVING: Home  AMB STATUS: Ambulatory  CAD Sx: History of MI, but no symptoms No MI within 6 months  PRIOR CHF: None  STRESS TEST: [x]  No, [ ]  Normal, [ ]  + ischemia, [ ]  + MI, [ ]  Both   Physical Examination  Filed Vitals:   12/18/15 1352 12/18/15 1400  BP: 150/88 140/71  Pulse: 84 77  Temp: 97 F (36.1 C)   TempSrc: Oral   Resp: 16   Height: 6' (1.829 m)   Weight: 206 lb (93.441 kg)   SpO2: 99%    Body mass index is 27.93 kg/(m^2).  General: A&O x 3, WDWN  Head: [place]/AT  Ear/Nose/Throat: Hearing grossly intact, nares w/o erythema or drainage, oropharynx w/o Erythema/Exudate  Eyes: PERRLA, EOMI  Neck: Supple, no nuchal rigidity, no palpable LAD  Pulmonary: Sym exp, good air movt, CTAB, no rales, rhonchi, & wheezing  Cardiac: RRR, Nl S1, S2, no Murmurs, rubs or gallops  Vascular: Vessel Right Left  Radial Palpable Palpable  Brachial Palpable Palpable  Carotid Palpable, without bruit Palpable, without bruit  Aorta Not palpable N/A  Femoral Palpable Palpable  Popliteal Not palpable Not palpable  PT Not Palpable Not Palpable  DP Not Palpable Not Palpable   Gastrointestinal: soft, NTND, no G/R, no HSM, no masses, no CVAT B, pannus limited AAA palpation  Musculoskeletal: M/S 5/5 throughout , Extremities without ischemic changes   Neurologic: CN 2-12 intact , Pain and light touch intact in extremities , Motor exam as listed above  Psychiatric: Judgment intact, Mood & affect appropriate for pt's clinical situation  Dermatologic: See M/S exam for extremity exam, no rashes otherwise noted  Lymph : No Cervical, Axillary, or Inguinal lymphadenopathy    CT abd/pelvis w/o contrast: able to open CD once, then fails to load.    Based on my review of the CT, he has extensive atherosclerosis in his aortoiliac arteries.  He has a distal  aortic AAA 4-5 cm in diameter.  Unable to get ruler to function appropriately.    Medical Decision Making  The patient is a 82 y.o. male who presents with: small asx AAA, prostate cancer, lumbago   I'm not certain the etiology of this patient's back pain, but I doubt his small AAA is the etiology.  Based on this patient's exam and diagnostic studies, he needs q6 month AAA duplex.  The threshold for repair is AAA size > 5.5 cm, growth > 1 cm/yr, and symptomatic status.  I emphasized the importance of maximal medical management including strict control of blood pressure, blood glucose, and lipid levels, antiplatelet agents, obtaining regular exercise, and cessation of smoking.    Thank you for allowing [place] to participate in this patient's care.   Adele Barthel, MD [facility] Office: [phone] Pager: [phone]  12/18/2015, 3:27 PM

## 2015-12-19 ENCOUNTER — Other Ambulatory Visit: Payer: Self-pay | Admitting: Endocrinology

## 2015-12-25 ENCOUNTER — Encounter: Payer: Self-pay | Admitting: Physician Assistant

## 2015-12-25 ENCOUNTER — Ambulatory Visit (INDEPENDENT_AMBULATORY_CARE_PROVIDER_SITE_OTHER): Payer: Medicare Other | Admitting: Physician Assistant

## 2015-12-25 ENCOUNTER — Ambulatory Visit: Payer: Medicare Other | Admitting: Cardiovascular Disease

## 2015-12-25 VITALS — BP 134/68 | HR 73 | Ht 72.0 in | Wt 216.0 lb

## 2015-12-25 DIAGNOSIS — I4891 Unspecified atrial fibrillation: Secondary | ICD-10-CM | POA: Diagnosis not present

## 2015-12-25 DIAGNOSIS — S6721XA Crushing injury of right hand, initial encounter: Secondary | ICD-10-CM

## 2015-12-25 DIAGNOSIS — I714 Abdominal aortic aneurysm, without rupture, unspecified: Secondary | ICD-10-CM

## 2015-12-25 DIAGNOSIS — I2510 Atherosclerotic heart disease of native coronary artery without angina pectoris: Secondary | ICD-10-CM | POA: Diagnosis not present

## 2015-12-25 DIAGNOSIS — I10 Essential (primary) hypertension: Secondary | ICD-10-CM | POA: Diagnosis not present

## 2015-12-25 NOTE — Assessment & Plan Note (Signed)
This is a new diagnosis for this patient. It is measuring at 4.5 cm. Being followed by Dr. Bridgett Larsson.

## 2015-12-25 NOTE — Progress Notes (Signed)
Cardiology Office Note   Date:  12/25/2015   ID:  Steve Bailey, DOB September 25, 1934, MRN [id number]  PCP:  Dr. Elayne Snare Cardiologist:  Dr. Bronson Ing   Chief Complaint: Hand pain    History of Present Illness: Steve Bailey is a 82 y.o. male who presents for six-month follow-up. He has history of CAD status post CABG in 1997 with prior PCI to the LAD in 1990. Repeat cath 2005 showing distal LAD disease management medically. He also has history of chronic atrial fibrillation on Eliquis, HTN, HLD, IDDM. He was recently found to have a AAA measuring 4.5 cm on CT scan done for metastatic prostate cancer. He was seen by Dr. Bridgett Larsson who is going to monitor him every 6 months.  Patient comes in today stating that he thinks he shocked himself back into normal rhythm last month when he was working on a car and had an accident with a spark plug. He also crushed his right hand yesterday working on cars and it is swollen and bruised. He refuses to go to the emergency room. He denies any chest pain, palpitations, dyspnea, dyspnea on exertion, dizziness or presyncope. He thinks he is taken all his medications but has been unreliable in the past.    Past Medical History  Diagnosis Date  . DM (diabetes mellitus) ([place])   . Hyperlipidemia   . Tobacco abuse   . CAD (coronary artery disease)     remote CABG in 1997; prior PCI to the LAD i n1990; Nuclear study in February of 2010 with an EF of 60% with repeat cath showing distal LAD disease. Managed medically  . Prostate cancer ([place]) 09/02/12    Gleason 3+4=7, vol 103 cc  . Atrial fibrillation ([place]) dx 10/10/12    started on med 10/10/12  . Hypertension dx 10/10/12  . Thyroid disease dx 10/10/12    started on Synthroid  . History of radiation therapy 11/24/12-01/18/13    Prostate 78Gy/79f    Past Surgical History  Procedure Laterality Date  . Coronary artery bypass graft  11-1995    shows distal LAD disease, without recurrent symptoms of angina with  questionable mild apical ischemia but with normal EF   . Angioplasty      of his left anterior descending previously in 1990.  . Cardiovascular stress test  12-2008    showed EF 60%  . Cataract extraction  2010    bilateral  . Knee surgery      left, arthroscopic  . Carpal tunnel release      right hand  . Esophagogastroduodenoscopy (egd) with esophageal dilation  05/2012     Current Outpatient Prescriptions  Medication Sig Dispense Refill  . alfuzosin (UROXATRAL) 10 MG 24 hr tablet Take 10 mg by mouth daily. Reported on 12/18/2015    . amLODipine (NORVASC) 10 MG tablet Take 1 tablet (10 mg total) by mouth daily. (Patient not taking: Reported on 12/18/2015) 90 tablet 0  . apixaban (ELIQUIS) 5 MG TABS tablet Take 1 tablet (5 mg total) by mouth 2 (two) times daily. 60 tablet 6  . atorvastatin (LIPITOR) 40 MG tablet TAKE 1 TABLET EVERY DAY 90 tablet 3  . bicalutamide (CASODEX) 50 MG tablet Reported on 12/18/2015  0  . Cinnamon 500 MG capsule Take 500 mg by mouth 2 (two) times daily.    . finasteride (PROSCAR) 5 MG tablet Take 5 mg by mouth daily. Reported on 12/18/2015    . glucose blood (FREESTYLE LITE) test strip Use as instructed  2 times per day dx code E11.65 100 each 3  . HUMULIN 70/30 KWIKPEN (70-30) 100 UNIT/ML PEN INJECT 18 UNITS INTO THE SKIN EVERY MORNING & 22 UNITS AT SUPPER DAILY - NEEDS APPT 45 mL 0  . levothyroxine (SYNTHROID, LEVOTHROID) 25 MCG tablet Take 1 tablet (25 mcg total) by mouth daily. 30 tablet 11  . lisinopril (PRINIVIL,ZESTRIL) 40 MG tablet Take 1 tablet (40 mg total) by mouth daily. 90 tablet 3  . metFORMIN (GLUCOPHAGE-XR) 750 MG 24 hr tablet Take 1 tablet (750 mg total) by mouth daily with breakfast. (Patient not taking: Reported on 12/18/2015) 90 tablet 3  . metoprolol succinate (TOPROL-XL) 100 MG 24 hr tablet Take 1 tablet (100 mg total) by mouth daily. Take with or immediately following a meal. 90 tablet 1  . nitroGLYCERIN (NITROSTAT) 0.4 MG SL tablet Place 1  tablet (0.4 mg total) under the tongue every 5 (five) minutes as needed for chest pain. 25 tablet 6  . OVER THE COUNTER MEDICATION Take 1 tablet by mouth daily. Mega Red Capsules    . pioglitazone-metformin (ACTOPLUS MET) 15-500 MG tablet TAKE 1 TABLET EVERY DAY AT SUPPER 90 tablet 0   No current facility-administered medications for this visit.    Allergies:   Cephalexin    Social History:  The patient  reports that he quit smoking about 36 years ago. He quit smokeless tobacco use about 37 years ago. His smokeless tobacco use included Chew. He reports that he does not drink alcohol or use illicit drugs.   Family History:  The patient's   family history includes Cancer in his brother, sister, and sister; Diabetes in his brother; Heart attack in his mother, sister, sister, and sister; Heart disease in his brother, brother, sister, sister, sister, and sister; Stroke in his brother, brother, and father.    ROS:  Please see the history of present illness.   Otherwise, review of systems are positive for none.   All other systems are reviewed and negative.    PHYSICAL EXAM: VS:  BP 134/68 mmHg  Pulse 73  Ht 6' (1.829 m)  Wt 216 lb (97.977 kg)  BMI 29.29 kg/m2  SpO2 99% , BMI Body mass index is 29.29 kg/(m^2). GEN: Well nourished, well developed, in no acute distress Neck: no JVD, HJR, carotid bruits, or masses Cardiac: RRR; positive S4 , no rubs, thrill or heave,  Respiratory:  clear to auscultation bilaterally, normal work of breathing GI: soft, nontender, nondistended, + BS MS: no deformity or atrophy Extremities: Right hand swollen and tender with bruising on the palm, otherwise lower extremities without cyanosis, clubbing, edema, good distal pulses bilaterally.  Skin: warm and dry, no rash Neuro:  Strength and sensation are intact    EKG:  EKG is ordered today. Atrial fibrillation with no acute change   Recent Labs: 09/06/2015: Hemoglobin 12.6*; Platelets 158.0; TSH  2.42 12/03/2015: ALT 13; BUN 22; Creatinine, Ser 1.03; Potassium 4.3; Sodium 139    Lipid Panel    Component Value Date/Time   CHOL 121 09/06/2015 0930   TRIG 67.0 09/06/2015 0930   HDL 47.70 09/06/2015 0930   CHOLHDL 3 09/06/2015 0930   VLDL 13.4 09/06/2015 0930   LDLCALC 60 09/06/2015 0930      Wt Readings from Last 3 Encounters:  12/25/15 216 lb (97.977 kg)  12/18/15 206 lb (93.441 kg)  12/06/15 206 lb 6.4 oz (93.622 kg)      Other studies Reviewed: Additional studies/ records that were reviewed today include  and review of the records demonstrates:  Cardiac cath 2005 OVERALL IMPRESSION:  1. Severe native coronary artery disease with totally occluded left     circumflex, totally occluded left anterior descending, severe disease in     a small second diagonal vessel, and severe segmental disease in the right     coronary artery.  2. Patent saphenous vein graft x3 with patent left internal mammary graft.  3. Reasonably well-preserved left ventricular function with very minimal     anterior hypokinesis.    DISCUSSION:  It is felt that Mr. Balsam should have an excellent outlook at  this point in time.  His saphenous vein grafts appear to be in excellent  condition with no obvious atherosclerotic disease.  The distal vessels are  satisfactory and have good runoff.  It is overall felt that his prognosis  should be good.   2-D echo 2013Study Conclusions  - Left ventricle: Upper septal thickening. There is akinesis   of the inferolateral wall with suggestion of some   calcification of this wall. The cavity size was normal.   The estimated ejection fraction was 55%. Features are   consistent with a pseudonormal left ventricular filling   pattern, with concomitant abnormal relaxation and   increased filling pressure (grade 2 diastolic   dysfunction). Doppler parameters are consistent with high   ventricular filling pressure. - Aortic valve: Trivial regurgitation. - Aorta:  Slight dilitation of aortic root. Ascending aorta   not dilated. Aortic root dimension: 51m (ED). Ascending   aortic diameter: 359m(S). - Mitral valve: Mild regurgitation. - Left atrium: The atrium was mildly dilated. - Right ventricle: The cavity size was mildly dilated.   Systolic function was mildly reduced. - Pulmonary arteries: PA peak pressure: 3121mg (S).    ASSESSMENT AND PLAN:  Coronary artery disease Stable without chest pain. Continue current medications  Hypertension Blood pressure well controlled  Atrial fibrillation Patient remains in atrial fibrillation despite thinking he shocked himself back into normal rhythm last month while working on a car.Continue Eliquis 5 mg twice a day  AAA (abdominal aortic [place]. It is measuring at 4.5 cm. Being followed by Dr. CheBridgett LarssonCrushing injury of right hand Patient had his hand crushed while working on a car yesterday. It is quite swollen and bruised. He can move all his fingers. He refused to go to the emergency room. I offered to order an x-ray and refer him to an orthopedic doctor but he really fuses. He says he may go to the urgent care and have it checked out. I urged him to do that today.     Signed, MicErmalinda BarriosA-C  12/25/2015 12:21 PM    [facility] [id number]  [id number]: [phone]: [phone]

## 2015-12-25 NOTE — Assessment & Plan Note (Signed)
Patient remains in atrial fibrillation despite thinking he shocked himself back into normal rhythm last month while working on a car.Continue Eliquis 5 mg twice a day

## 2015-12-25 NOTE — Assessment & Plan Note (Signed)
Patient had his hand crushed while working on a car yesterday. It is quite swollen and bruised. He can move all his fingers. He refused to go to the emergency room. I offered to order an x-ray and refer him to an orthopedic doctor but he really fuses. He says he may go to the urgent care and have it checked out. I urged him to do that today.

## 2015-12-25 NOTE — Assessment & Plan Note (Signed)
Blood pressure well controlled

## 2015-12-25 NOTE — Patient Instructions (Signed)
Your physician wants you to follow-up in: 6 months with Dr. Bronson Ing. You will receive a reminder letter in the mail two months in advance. If you don't receive a letter, please call our office to schedule the follow-up appointment.  Your physician recommends that you continue on your current medications as directed. Please refer to the Current Medication list given to you today.  If you need a refill on your cardiac medications before your next appointment, please call your pharmacy.  Thank you for choosing [facility]!

## 2015-12-25 NOTE — Assessment & Plan Note (Signed)
Stable without chest pain. Continue current medications. 

## 2015-12-28 IMAGING — NM NM BONE WHOLE BODY
2 series · 2 of 2 positions shown · non-contrast
Comparison: None.

CLINICAL DATA: Prostate cancer

EXAM:
NUCLEAR MEDICINE WHOLE BODY BONE SCAN
TECHNIQUE: Whole body anterior and posterior images were obtained approximately
3 hours after intravenous injection of radiopharmaceutical.
RADIOPHARMACEUTICALS:  22.5 mCi 6echnetium-SSm MDP IV

[Series 1: wbr_bone_40 whole body · 2.66mm/px · 1 of 1 slices shown (1 of 2)]
[im 1/1]
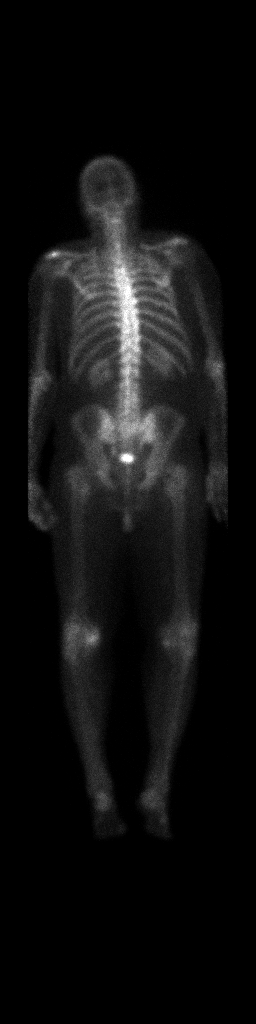

[Series 1: wbr_bone_40 whole body · 2.66mm/px · 1 of 1 slices shown (2 of 2)]
[im 1/1]
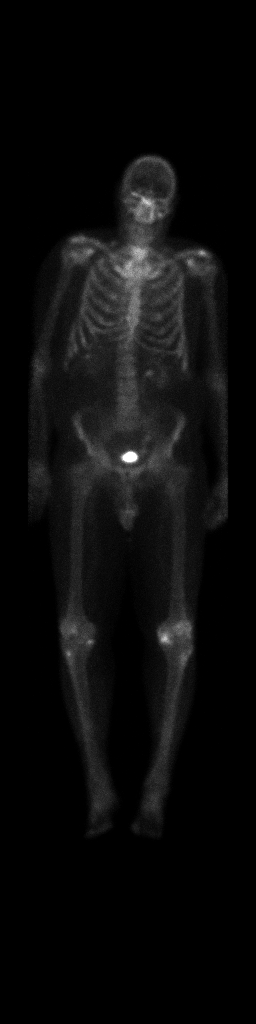

[2 of 2 positions shown; findings below may reference images not displayed]

FINDINGS: A whole-body bone scan was performed. There is homogeneous tracer
activity within bony structures without abnormal foci of increased
activity to suggest metastatic disease. Mild increased activity left
shoulder and left knee medially most likely degenerative in nature.
Normal bladder activity.
IMPRESSION: No evidence of metastatic disease. Probable mild degenerative
changes left shoulder and left knee medially.

## 2016-01-24 ENCOUNTER — Other Ambulatory Visit: Payer: Self-pay | Admitting: Endocrinology

## 2016-02-17 ENCOUNTER — Emergency Department (HOSPITAL_COMMUNITY): Payer: Medicare Other

## 2016-02-17 ENCOUNTER — Emergency Department (HOSPITAL_COMMUNITY)
Admission: EM | Admit: 2016-02-17 | Discharge: 2016-02-17 | Disposition: A | Discharge status: Home / Self Care | Payer: Medicare Other | Attending: Emergency Medicine | Admitting: Emergency Medicine

## 2016-02-17 ENCOUNTER — Encounter (HOSPITAL_COMMUNITY): Payer: Self-pay

## 2016-02-17 DIAGNOSIS — I10 Essential (primary) hypertension: Secondary | ICD-10-CM | POA: Diagnosis not present

## 2016-02-17 DIAGNOSIS — E119 Type 2 diabetes mellitus without complications: Secondary | ICD-10-CM | POA: Diagnosis not present

## 2016-02-17 DIAGNOSIS — S0990XA Unspecified injury of head, initial encounter: Secondary | ICD-10-CM | POA: Diagnosis not present

## 2016-02-17 DIAGNOSIS — T148 Other injury of unspecified body region: Secondary | ICD-10-CM | POA: Diagnosis not present

## 2016-02-17 DIAGNOSIS — Z79899 Other long term (current) drug therapy: Secondary | ICD-10-CM | POA: Diagnosis not present

## 2016-02-17 DIAGNOSIS — Y990 Civilian activity done for income or pay: Secondary | ICD-10-CM | POA: Insufficient documentation

## 2016-02-17 DIAGNOSIS — I2510 Atherosclerotic heart disease of native coronary artery without angina pectoris: Secondary | ICD-10-CM | POA: Insufficient documentation

## 2016-02-17 DIAGNOSIS — R55 Syncope and collapse: Secondary | ICD-10-CM | POA: Diagnosis not present

## 2016-02-17 DIAGNOSIS — Y9389 Activity, other specified: Secondary | ICD-10-CM | POA: Insufficient documentation

## 2016-02-17 DIAGNOSIS — Z951 Presence of aortocoronary bypass graft: Secondary | ICD-10-CM | POA: Insufficient documentation

## 2016-02-17 DIAGNOSIS — S199XXA Unspecified injury of neck, initial encounter: Secondary | ICD-10-CM | POA: Diagnosis present

## 2016-02-17 DIAGNOSIS — S129XXA Fracture of neck, unspecified, initial encounter: Secondary | ICD-10-CM

## 2016-02-17 DIAGNOSIS — I4891 Unspecified atrial fibrillation: Secondary | ICD-10-CM | POA: Insufficient documentation

## 2016-02-17 DIAGNOSIS — W11XXXA Fall on and from ladder, initial encounter: Secondary | ICD-10-CM | POA: Insufficient documentation

## 2016-02-17 DIAGNOSIS — W19XXXA Unspecified fall, initial encounter: Secondary | ICD-10-CM

## 2016-02-17 DIAGNOSIS — Z87891 Personal history of nicotine dependence: Secondary | ICD-10-CM | POA: Diagnosis not present

## 2016-02-17 DIAGNOSIS — S300XXA Contusion of lower back and pelvis, initial encounter: Secondary | ICD-10-CM | POA: Insufficient documentation

## 2016-02-17 DIAGNOSIS — M25519 Pain in unspecified shoulder: Secondary | ICD-10-CM | POA: Diagnosis not present

## 2016-02-17 DIAGNOSIS — S299XXA Unspecified injury of thorax, initial encounter: Secondary | ICD-10-CM | POA: Diagnosis not present

## 2016-02-17 DIAGNOSIS — Z8546 Personal history of malignant neoplasm of prostate: Secondary | ICD-10-CM | POA: Diagnosis not present

## 2016-02-17 DIAGNOSIS — Y9289 Other specified places as the place of occurrence of the external cause: Secondary | ICD-10-CM | POA: Insufficient documentation

## 2016-02-17 DIAGNOSIS — E079 Disorder of thyroid, unspecified: Secondary | ICD-10-CM | POA: Diagnosis not present

## 2016-02-17 DIAGNOSIS — S3992XA Unspecified injury of lower back, initial encounter: Secondary | ICD-10-CM | POA: Diagnosis not present

## 2016-02-17 DIAGNOSIS — S12500A Unspecified displaced fracture of sixth cervical vertebra, initial encounter for closed fracture: Secondary | ICD-10-CM | POA: Diagnosis not present

## 2016-02-17 DIAGNOSIS — Z7901 Long term (current) use of anticoagulants: Secondary | ICD-10-CM | POA: Insufficient documentation

## 2016-02-17 DIAGNOSIS — Z794 Long term (current) use of insulin: Secondary | ICD-10-CM | POA: Diagnosis not present

## 2016-02-17 DIAGNOSIS — M542 Cervicalgia: Secondary | ICD-10-CM | POA: Diagnosis not present

## 2016-02-17 DIAGNOSIS — R51 Headache: Secondary | ICD-10-CM | POA: Diagnosis not present

## 2016-02-17 DIAGNOSIS — E785 Hyperlipidemia, unspecified: Secondary | ICD-10-CM | POA: Diagnosis not present

## 2016-02-17 DIAGNOSIS — Z7984 Long term (current) use of oral hypoglycemic drugs: Secondary | ICD-10-CM | POA: Insufficient documentation

## 2016-02-17 LAB — CBC
HCT: 30.7 % — ABNORMAL LOW (ref 39.0–52.0)
Hemoglobin: 10.1 g/dL — ABNORMAL LOW (ref 13.0–17.0)
MCH: 32.6 pg (ref 26.0–34.0)
MCHC: 32.9 g/dL (ref 30.0–36.0)
MCV: 99 fL (ref 78.0–100.0)
Platelets: 137 10*3/uL — ABNORMAL LOW (ref 150–400)
RBC: 3.1 MIL/uL — ABNORMAL LOW (ref 4.22–5.81)
RDW: 14.7 % (ref 11.5–15.5)
WBC: 5.5 10*3/uL (ref 4.0–10.5)

## 2016-02-17 LAB — BASIC METABOLIC PANEL
Anion gap: 9 (ref 5–15)
BUN: 22 mg/dL — ABNORMAL HIGH (ref 6–20)
CO2: 24 mmol/L (ref 22–32)
Calcium: 8.9 mg/dL (ref 8.9–10.3)
Chloride: 106 mmol/L (ref 101–111)
Creatinine, Ser: 1.29 mg/dL — ABNORMAL HIGH (ref 0.61–1.24)
GFR calc Af Amer: 59 mL/min — ABNORMAL LOW (ref >60)
GFR calc non Af Amer: 51 mL/min — ABNORMAL LOW (ref >60)
Glucose, Bld: 169 mg/dL — ABNORMAL HIGH (ref 65–99)
Potassium: 4.3 mmol/L (ref 3.5–5.1)
Sodium: 139 mmol/L (ref 135–145)

## 2016-02-17 MED ORDER — ONDANSETRON HCL 4 MG/2ML IJ SOLN
4.0000 mg | Freq: Once | INTRAMUSCULAR | Status: AC
  Administered 2016-02-17: 4 mg via INTRAVENOUS
  Filled 2016-02-17: qty 2

## 2016-02-17 MED ORDER — HYDROCODONE-ACETAMINOPHEN 5-325 MG PO TABS: ORAL_TABLET | ORAL | Status: DC

## 2016-02-17 MED ORDER — FENTANYL CITRATE (PF) 100 MCG/2ML IJ SOLN
50.0000 ug | INTRAMUSCULAR | Status: DC | PRN
Start: 2016-02-17 — End: 2016-02-17
  Administered 2016-02-17: 50 ug via INTRAVENOUS
  Filled 2016-02-17: qty 2

## 2016-02-17 MED ORDER — HYDROCODONE-ACETAMINOPHEN 5-325 MG PO TABS
1.0000 | ORAL_TABLET | Freq: Once | ORAL | Status: AC
  Administered 2016-02-17: 1 via ORAL
  Filled 2016-02-17: qty 1

## 2016-02-17 NOTE — ED Notes (Signed)
Pt ambulated in hallway w/ steady gait.

## 2016-02-17 NOTE — ED Provider Notes (Signed)
CSN: [id number]     Arrival date & time 02/17/16  1559 History   First MD Initiated Contact with Patient 02/17/16 1600     Chief Complaint  Patient presents with  . Fall  . 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 Patient struck head on the gas tank of the truck. Reported brief loss of consciousness. Patient awoke and was able to ambulate into his house where he took some aspirin. Patient's wife called his daughter who in turn called EMS. Patient was immobilized in a cervical collar prior to arrival. Patient currently complains of pain at the base of his posterior skull, lower cervical spine, and lower L-spine. Moves extremities well. He denies any pain or tenderness in his chest or abdomen. No chest pain or shortness of breath. No other treatments prior to arrival. Onset of symptoms acute. Course is constant. Movement makes pain worse.  Patient is a 82 y.o. male presenting with fall and syncope. The history is provided by the patient.  Fall Associated symptoms include neck pain. Pertinent negatives include no abdominal pain, chest pain, congestion, coughing, fever, headaches, myalgias, nausea, numbness, rash, sore throat, vomiting or weakness.  Loss of Consciousness Associated symptoms: no chest pain, no confusion, no fever, no headaches, no nausea, no shortness of breath, no vomiting and no weakness     Past Medical History  Diagnosis Date  . DM (diabetes mellitus) ([place])   . Hyperlipidemia   . Tobacco abuse   . CAD (coronary artery disease)     remote CABG in 1997; prior PCI to the LAD i n1990; Nuclear study in February of 2010 with an EF of 60% with repeat cath showing distal LAD disease. Managed medically  . Prostate cancer ([place]) 09/02/12     Gleason 3+4=7, vol 103 cc  . Atrial fibrillation ([place]) dx 10/10/12    started on med 10/10/12  . Hypertension dx 10/10/12  . Thyroid disease dx 10/10/12    started on Synthroid  . History of radiation therapy 11/24/12-01/18/13    Prostate 78Gy/82f   Past Surgical History  Procedure Laterality Date  . Coronary artery bypass graft  11-1995    shows distal LAD disease, without recurrent symptoms of angina with questionable mild apical ischemia but with normal EF   . Angioplasty      of his left anterior descending previously in 1990.  . Cardiovascular stress test  12-2008    showed EF 60%  . Cataract extraction  2010    bilateral  . Knee surgery      left, arthroscopic  . Carpal tunnel release      right hand  . Esophagogastroduodenoscopy (egd) with esophageal dilation  05/2012   Family History  Problem Relation Age of Onset  . Stroke Father   . Heart attack Mother   . Cancer Sister     lung  . Cancer Brother     prostate  . Diabetes Brother   . Stroke Brother   . Stroke Brother   . Heart disease Brother   . Heart disease Brother   . Heart attack Sister   . Heart disease Sister   . Heart attack Sister   . Cancer Sister     breast  . Heart disease Sister   . Heart attack Sister   .  Heart disease Sister   . Heart disease Sister    Social History  Substance Use Topics  . Smoking status: Former Smoker    Quit date: 10/14/1979  . Smokeless tobacco: Former Systems developer    Types: [place] date: 11/23/1978  . Alcohol Use: No    Review of Systems  Constitutional: Negative for fever.  HENT: Negative for congestion, dental problem, rhinorrhea, sinus pressure and sore throat.   Eyes: Negative for photophobia, discharge, redness and visual disturbance.  Respiratory: Negative for cough and shortness of breath.   Cardiovascular: Positive for syncope. Negative for chest pain.  Gastrointestinal: Negative for nausea, vomiting, abdominal pain and diarrhea.  Genitourinary: Negative  for dysuria.  Musculoskeletal: Positive for back pain and neck pain. Negative for myalgias, gait problem and neck stiffness.  Skin: Negative for rash.  Neurological: Negative for syncope, speech difficulty, weakness, light-headedness, numbness and headaches.  Psychiatric/Behavioral: Negative for confusion.      Allergies  Cephalexin  Home Medications   Prior to Admission medications   Medication Sig Start Date End Date Taking? Authorizing Provider  alfuzosin (UROXATRAL) 10 MG 24 hr tablet Take 10 mg by mouth daily. Reported on 12/18/2015    Historical Provider, MD  amLODipine (NORVASC) 10 MG tablet Take 1 tablet (10 mg total) by mouth daily. Patient not taking: Reported on 12/18/2015 10/21/15   Herminio Commons, MD  apixaban (ELIQUIS) 5 MG TABS tablet Take 1 tablet (5 mg total) by mouth 2 (two) times daily. 08/16/15   Herminio Commons, MD  atorvastatin (LIPITOR) 40 MG tablet TAKE 1 TABLET EVERY DAY 07/24/15   Herminio Commons, MD  bicalutamide (CASODEX) 50 MG tablet Reported on 12/18/2015 11/21/15   Historical Provider, MD  Cinnamon 500 MG capsule Take 500 mg by mouth 2 (two) times daily.    Historical Provider, MD  finasteride (PROSCAR) 5 MG tablet Take 5 mg by mouth daily. Reported on 12/18/2015    Historical Provider, MD  glucose blood (FREESTYLE LITE) test strip Use as instructed 2 times per day dx code E11.65 11/19/14   Elayne Snare, MD  HUMULIN 70/30 KWIKPEN (70-30) 100 UNIT/ML PEN INJECT 18 UNITS INTO THE SKIN EVERY MORNING & 22 UNITS AT SUPPER DAILY - NEEDS APPT 01/24/16   Elayne Snare, MD  levothyroxine (SYNTHROID, LEVOTHROID) 25 MCG tablet Take 1 tablet (25 mcg total) by mouth daily. 10/11/12   Burtis Junes, NP  lisinopril (PRINIVIL,ZESTRIL) 40 MG tablet Take 1 tablet (40 mg total) by mouth daily. 06/28/15   Herminio Commons, MD  metFORMIN (GLUCOPHAGE-XR) 750 MG 24 hr tablet Take 1 tablet (750 mg total) by mouth daily with breakfast. Patient not taking: Reported on 12/18/2015  12/06/15   Elayne Snare, MD  metoprolol succinate (TOPROL-XL) 100 MG 24 hr tablet Take 1 tablet (100 mg total) by mouth daily. Take with or immediately following a meal. 01/08/14   Herminio Commons, MD  nitroGLYCERIN (NITROSTAT) 0.4 MG SL tablet Place 1 tablet (0.4 mg total) under the tongue every 5 (five) minutes as needed for chest pain. 01/08/14   Herminio Commons, MD  OVER THE COUNTER MEDICATION Take 1 tablet by mouth daily. Mega Red Capsules    Historical Provider, MD  pioglitazone-metformin (ACTOPLUS MET) 15-500 MG tablet TAKE 1 TABLET EVERY DAY AT SUPPER 12/20/15   Elayne Snare, MD   BP 172/74 mmHg  Pulse 71  Temp(Src) 97.9 F (36.6 C) (Oral)  Resp 17  SpO2 99%   Physical Exam  Constitutional: He is oriented to person, place, and time. He appears well-developed and well-nourished. No distress.  HENT:  Head: Normocephalic and atraumatic. Head is without raccoon's eyes and without Battle's sign.  Right Ear: Tympanic membrane, external ear and ear canal normal. Tympanic membrane is not perforated. No hemotympanum.  Left Ear: Tympanic membrane, external ear and ear canal normal. Tympanic membrane is not perforated. No hemotympanum.  Nose: Nose normal. No mucosal edema, septal deviation or nasal septal hematoma. No epistaxis.  Mouth/Throat: Uvula is midline, oropharynx is clear and moist and mucous membranes are normal. Normal dentition. No posterior oropharyngeal edema or posterior oropharyngeal erythema.  No malocclusion of jaw. No facial pain to palpation. Upper partial in place.   Eyes: Conjunctivae, EOM and lids are normal. Pupils are equal, round, and reactive to light. Right eye exhibits no discharge. Left eye exhibits no discharge.  Fundoscopic exam:      The right eye shows no hemorrhage.       The left eye shows no hemorrhage.  Slit lamp exam:      The right eye shows no hyphema.       The left eye shows no hyphema.  No visible hyphema. Pupils are 2-3 mm bilaterally.   Neck:  Trachea normal, normal range of motion and full passive range of motion without pain. Neck supple. No spinous process tenderness present. Normal range of motion present.  Cardiovascular: Normal rate, regular rhythm and normal heart sounds.   No murmur heard. Pulmonary/Chest: Effort normal and breath sounds normal. No respiratory distress. He has no wheezes. He has no rales. He exhibits no tenderness.  No visible signs of trauma including hematomas, bruising, lacerations, abrasions.   Abdominal: Soft. Bowel sounds are normal. He exhibits no distension. There is no tenderness. There is no rebound and no guarding.  No visible signs of trauma including hematomas, bruising, lacerations, abrasions.   Musculoskeletal:       Right shoulder: He exhibits normal range of motion, no tenderness and no bony tenderness.       Left shoulder: He exhibits normal range of motion, no tenderness and no bony tenderness.       Right elbow: He exhibits normal range of motion. No tenderness found.       Left elbow: He exhibits normal range of motion. No tenderness found.       Right wrist: He exhibits normal range of motion and no tenderness.       Left wrist: He exhibits normal range of motion and no tenderness.       Right hip: He exhibits normal range of motion and no tenderness.       Left hip: He exhibits normal range of motion and no tenderness.       Right knee: He exhibits normal range of motion. No tenderness found.       Left knee: He exhibits normal range of motion. No tenderness found.       Right ankle: He exhibits normal range of motion. No tenderness.       Left ankle: He exhibits normal range of motion. No tenderness.       Cervical back: He exhibits tenderness and bony tenderness. Decreased range of motion: in c-collar.       Thoracic back: Normal. He exhibits normal range of motion, no tenderness and no bony tenderness.       Lumbar back: He exhibits tenderness and bony tenderness (lower l-spine,  sacrum). He exhibits normal range of motion.  Right upper arm: He exhibits no tenderness, no bony tenderness and no swelling.       Left upper arm: He exhibits no tenderness, no bony tenderness and no swelling.       Right forearm: He exhibits no tenderness, no bony tenderness and no swelling.       Left forearm: He exhibits no tenderness, no bony tenderness and no swelling.       Right hand: Normal. He exhibits normal range of motion and no tenderness.       Left hand: Normal. He exhibits normal range of motion and no tenderness.       Right upper leg: He exhibits no tenderness, no bony tenderness and no swelling.       Left upper leg: He exhibits no tenderness, no bony tenderness and no swelling.       Right lower leg: He exhibits no tenderness, no bony tenderness and no swelling.       Left lower leg: He exhibits no tenderness, no bony tenderness and no swelling.       Right foot: There is normal range of motion and no tenderness.       Left foot: There is normal range of motion and no tenderness.  Neurological: He is alert and oriented to person, place, and time. He has normal strength and normal reflexes. No cranial nerve deficit or sensory deficit. Coordination and gait normal. GCS eye subscore is 4. GCS verbal subscore is 5. GCS motor subscore is 6.  Normal gross movement all extremities.   Skin: Skin is warm and dry.  Psychiatric: He has a normal mood and affect.  Nursing note and vitals reviewed.   ED Course  Procedures (including critical care time) Labs Review Labs Reviewed  CBC - Abnormal; Notable for the following:    RBC 3.10 (*)    Hemoglobin 10.1 (*)    HCT 30.7 (*)    Platelets 137 (*)    All other components within normal limits  BASIC METABOLIC PANEL - Abnormal; Notable for the following:    Glucose, Bld 169 (*)    BUN 22 (*)    Creatinine, Ser 1.29 (*)    GFR calc non Af Amer 51 (*)    GFR calc Af Amer 59 (*)    All other components within normal limits     Imaging Review Dg Chest 2 View  02/17/2016  CLINICAL DATA:  Status post fall.  Positive loss of consciousness. EXAM: CHEST  2 VIEW COMPARISON:  09/22/2004 FINDINGS: There is no focal parenchymal opacity. There is no pleural effusion or pneumothorax. The heart and mediastinal contours are unremarkable. There is evidence of prior CABG. The osseous structures are unremarkable. IMPRESSION: No active cardiopulmonary disease. Electronically Signed   By: Kathreen Devoid   On: 02/17/2016 16:40   Ct Head Wo Contrast  02/17/2016  CLINICAL DATA:  Loss of consciousness after fall from 8 foot ladder. EXAM: CT HEAD WITHOUT CONTRAST CT CERVICAL SPINE WITHOUT CONTRAST TECHNIQUE: Multidetector CT imaging of the head and cervical spine was performed following the standard protocol without intravenous contrast. Multiplanar CT image reconstructions of the cervical spine were also generated. COMPARISON:  None. FINDINGS: CT HEAD FINDINGS Bony calvarium appears intact. No mass effect or midline shift is noted. Ventricular size is within normal limits. There is no evidence of mass lesion, hemorrhage or acute infarction. CT CERVICAL SPINE FINDINGS Moderate degenerative disc disease is noted at C3-4. There appears to be fusion of of the  C5, C6 and C7 vertebral bodies most likely due to degenerative change. No spondylolisthesis is noted. Anterior osteophyte formation is noted at C4-5. Minimally displaced fracture is seen involving the right posterior lamina of C6. IMPRESSION: Normal head CT. Minimally displaced fracture is seen involving the right posterior lamina of C6 posteriorly. No other fracture or spondylolisthesis is noted. Multilevel degenerative disc disease is noted. Electronically Signed   By: Marijo Conception, M.D.   On: 02/17/2016 18:04   Ct Cervical Spine Wo Contrast  02/17/2016  CLINICAL DATA:  Loss of consciousness after fall from 8 foot ladder. EXAM: CT HEAD WITHOUT CONTRAST CT CERVICAL SPINE WITHOUT CONTRAST  TECHNIQUE: Multidetector CT imaging of the head and cervical spine was performed following the standard protocol without intravenous contrast. Multiplanar CT image reconstructions of the cervical spine were also generated. COMPARISON:  None. FINDINGS: CT HEAD FINDINGS Bony calvarium appears intact. No mass effect or midline shift is noted. Ventricular size is within normal limits. There is no evidence of mass lesion, hemorrhage or acute infarction. CT CERVICAL SPINE FINDINGS Moderate degenerative disc disease is noted at C3-4. There appears to be fusion of of the C5, C6 and C7 vertebral bodies most likely due to degenerative change. No spondylolisthesis is noted. Anterior osteophyte formation is noted at C4-5. Minimally displaced fracture is seen involving the right posterior lamina of C6. IMPRESSION: Normal head CT. Minimally displaced fracture is seen involving the right posterior lamina of C6 posteriorly. No other fracture or spondylolisthesis is noted. Multilevel degenerative disc disease is noted. Electronically Signed   By: Marijo Conception, M.D.   On: 02/17/2016 18:04   Ct Lumbar Spine Wo Contrast  02/17/2016  CLINICAL DATA:  Status post fall from a ladder. EXAM: CT LUMBAR SPINE WITHOUT CONTRAST TECHNIQUE: Multidetector CT imaging of the lumbar spine was performed without intravenous contrast administration. Multiplanar CT image reconstructions were also generated. COMPARISON:  None. FINDINGS: The alignment is anatomic. The vertebral body heights are maintained. There is no acute fracture or static listhesis. The paravertebral soft tissues are normal. The intraspinal soft tissues are not fully imaged on this examination due to poor soft tissue contrast, but there is no gross soft tissue abnormality. There is degenerative disc disease with disc height loss at L5-S1. There is mild broad-based disc bulge at L2-3. There is a mild broad-based disc bulge and bilateral facet arthropathy at L3-4. There is mild  spinal stenosis at L3-4. Broad-based disc bulge at L4-5 with bilateral facet arthropathy and mild central canal stenosis. Mild bilateral facet arthropathy at L5-S1. There is abdominal aortic atherosclerosis. There is an infrarenal abdominal aortic aneurysm partially visualized measuring approximately 4.2 cm in transverse diameter. IMPRESSION: 1.  No acute osseous injury of the lumbar spine. 2. Lumbar spine spondylosis as described above. 3. Infrarenal abdominal aortic aneurysm partially visualized measuring approximately 4.2 cm in transverse diameter. Electronically Signed   By: Kathreen Devoid   On: 02/17/2016 17:59   I have personally reviewed and evaluated these images and lab results as part of my medical decision-making.   EKG Interpretation None       4:09 PM Patient seen and examined. Pt is alert and oriented x 4. Does report LOC but remembers events surrounding the fall. Moves arms and legs well. No chest or abdominal pain. Currently has pain at base of occiput, lower c-spine, and lower l-spine. Imaging and labs ordered. No facial pain or malocclusion. Normal lung and heart sounds. No abdominal tenderness. 2+ peripheral pulses. On blood  thinner for afib.   Vital signs reviewed and are as follows: BP 172/74 mmHg  Pulse 71  Temp(Src) 97.9 F (36.6 C) (Oral)  Resp 17  SpO2 99%  7:19 PM Patient discussed with and seen by Dr. Eulis Foster earlier. Imaging shows C6 fracture only. Dr. Eulis Foster is spoken with neurosurgery regarding this and patient has been placed in an Aspen collar. Previously known AAA followed by Dr. Bridgett Larsson. Labs are otherwise unremarkable.  Patient feels good and wants to go home. He is able to sit up and ambulate without difficulty. He has family to watch him at home. Chest and abdominal exam remains unchanged during ED stay. He has not developed any tenderness in these areas. No lightheadedness or syncope. Vital signs are stable. Do not suspect acute bleeding.   Patient was counseled  on head injury precautions and symptoms that should indicate their return to the ED.  These include severe worsening headache, vision changes, confusion, loss of consciousness, trouble walking, nausea & vomiting, or weakness/tingling in extremities.    Patient counseled on use of narcotic pain medications. Counseled not to combine these medications with others containing tylenol. Urged not to drink alcohol, drive, or perform any other activities that requires focus while taking these medications. The patient verbalizes understanding and agrees with the plan.  Use pain medication only under direct supervision at the lowest possible dose needed to control your pain.    MDM   Final diagnoses:  Cervical spine fracture, initial encounter  Contusion of lower back, initial encounter  Fall, initial encounter   Patient with fall from height. Main complaint is neck and lower back pain. Imaging revealed C6 vertebral fracture. Do Definitive fracture management performed with Aspen collar. Neurosurgery consulted by telephone. Patient has no neurological deficits suspect spinal cord injury. He has no functional deficits which would preclude discharged home tonight. Family will help monitor his activity. Return instructions as discussed above. Patient is anxious to leave.    Carlisle Cater, PA-C 02/17/16 1927  Daleen Bo, MD 02/18/16 [phone]

## 2016-02-17 NOTE — ED Provider Notes (Signed)
Face-to-face evaluation   History: Patient was working on painting a truck, when the ladder. He was on tipped over, causing to fall, striking his buttocks and head on the truck.  Physical exam: Alert, elderly man. He is able to move his arms and legs easily. He is wearing a cervical collar, the neck is tilted to the left.  Clinical impression- mild head injury without suspected intracranial bleeding, with likely neck injury, and possible jumped facet.  18:15- page placed for neurosurgery consultation  6:16 PM Reevaluation with update and discussion. After initial assessment and treatment, an updated evaluation reveals he is comfortable, no further complaints. Findings discussed with patient and family members, all questions answered.Daleen Bo L   Disposition- DC home with aspen collar for protection with follow-up with neurosurgeon, in one week.     Plan- CT imaging to evaluate for spinal instability. Also, rule out intracranial bleeding or contusion.   Dg Chest 2 View  02/17/2016  CLINICAL DATA:  Status post fall.  Positive loss of consciousness. EXAM: CHEST  2 VIEW COMPARISON:  09/22/2004 FINDINGS: There is no focal parenchymal opacity. There is no pleural effusion or pneumothorax. The heart and mediastinal contours are unremarkable. There is evidence of prior CABG. The osseous structures are unremarkable. IMPRESSION: No active cardiopulmonary disease. Electronically Signed   By: Kathreen Devoid   On: 02/17/2016 16:40   Ct Head Wo Contrast  02/17/2016  CLINICAL DATA:  Loss of consciousness after fall from 8 foot ladder. EXAM: CT HEAD WITHOUT CONTRAST CT CERVICAL SPINE WITHOUT CONTRAST TECHNIQUE: Multidetector CT imaging of the head and cervical spine was performed following the standard protocol without intravenous contrast. Multiplanar CT image reconstructions of the cervical spine were also generated. COMPARISON:  None. FINDINGS: CT HEAD FINDINGS Bony calvarium appears intact. No  mass effect or midline shift is noted. Ventricular size is within normal limits. There is no evidence of mass lesion, hemorrhage or acute infarction. CT CERVICAL SPINE FINDINGS Moderate degenerative disc disease is noted at C3-4. There appears to be fusion of of the C5, C6 and C7 vertebral bodies most likely due to degenerative change. No spondylolisthesis is noted. Anterior osteophyte formation is noted at C4-5. Minimally displaced fracture is seen involving the right posterior lamina of C6. IMPRESSION: Normal head CT. Minimally displaced fracture is seen involving the right posterior lamina of C6 posteriorly. No other fracture or spondylolisthesis is noted. Multilevel degenerative disc disease is noted. Electronically Signed   By: Marijo Conception, M.D.   On: 02/17/2016 18:04   Ct Cervical Spine Wo Contrast  02/17/2016  CLINICAL DATA:  Loss of consciousness after fall from 8 foot ladder. EXAM: CT HEAD WITHOUT CONTRAST CT CERVICAL SPINE WITHOUT CONTRAST TECHNIQUE: Multidetector CT imaging of the head and cervical spine was performed following the standard protocol without intravenous contrast. Multiplanar CT image reconstructions of the cervical spine were also generated. COMPARISON:  None. FINDINGS: CT HEAD FINDINGS Bony calvarium appears intact. No mass effect or midline shift is noted. Ventricular size is within normal limits. There is no evidence of mass lesion, hemorrhage or acute infarction. CT CERVICAL SPINE FINDINGS Moderate degenerative disc disease is noted at C3-4. There appears to be fusion of of the C5, C6 and C7 vertebral bodies most likely due to degenerative change. No spondylolisthesis is noted. Anterior osteophyte formation is noted at C4-5. Minimally displaced fracture is seen involving the right posterior lamina of C6. IMPRESSION: Normal head CT. Minimally displaced fracture is seen involving the right posterior lamina of  C6 posteriorly. No other fracture or spondylolisthesis is noted.  Multilevel degenerative disc disease is noted. Electronically Signed   By: Marijo Conception, M.D.   On: 02/17/2016 18:04   Ct Lumbar Spine Wo Contrast  02/17/2016  CLINICAL DATA:  Status post fall from a ladder. EXAM: CT LUMBAR SPINE WITHOUT CONTRAST TECHNIQUE: Multidetector CT imaging of the lumbar spine was performed without intravenous contrast administration. Multiplanar CT image reconstructions were also generated. COMPARISON:  None. FINDINGS: The alignment is anatomic. The vertebral body heights are maintained. There is no acute fracture or static listhesis. The paravertebral soft tissues are normal. The intraspinal soft tissues are not fully imaged on this examination due to poor soft tissue contrast, but there is no gross soft tissue abnormality. There is degenerative disc disease with disc height loss at L5-S1. There is mild broad-based disc bulge at L2-3. There is a mild broad-based disc bulge and bilateral facet arthropathy at L3-4. There is mild spinal stenosis at L3-4. Broad-based disc bulge at L4-5 with bilateral facet arthropathy and mild central canal stenosis. Mild bilateral facet arthropathy at L5-S1. There is abdominal aortic atherosclerosis. There is an infrarenal abdominal aortic aneurysm partially visualized measuring approximately 4.2 cm in transverse diameter. IMPRESSION: 1.  No acute osseous injury of the lumbar spine. 2. Lumbar spine spondylosis as described above. 3. Infrarenal abdominal aortic aneurysm partially visualized measuring approximately 4.2 cm in transverse diameter. Electronically Signed   By: Kathreen Devoid   On: 02/17/2016 17:59    Medical screening examination/treatment/procedure(s) were conducted as a shared visit with non-physician practitioner(s) and myself.  I personally evaluated the patient during the encounter  Daleen Bo, MD 02/18/16 2344

## 2016-02-17 NOTE — ED Notes (Signed)
Pt just now reports to RN that he did lose consciousness when he hit head on gas tank. Neuro intact. Denies numbness/tingling.

## 2016-02-17 NOTE — Discharge Instructions (Signed)
Please read and follow all provided instructions.  Your diagnoses today include:  1. Cervical spine fracture, initial encounter   2. Contusion of lower back, initial encounter   3. Fall, initial encounter     Tests performed today include:  CT scan of your head that did not show any serious injury.  CT scan of neck which shows a break in the C6 vertebra.   CT scan of your lower back that shows no broken bones, shows known aortic aneurysm  X-ray chest - normal  Blood counts and electrolytes - no problems  Vital signs. See below for your results today.   Medications prescribed:   Vicodin (hydrocodone/acetaminophen) - narcotic pain medication  DO NOT drive or perform any activities that require you to be awake and alert because this medicine can make you drowsy. BE VERY CAREFUL not to take multiple medicines containing Tylenol (also called acetaminophen). Doing so can lead to an overdose which can damage your liver and cause liver failure and possibly death.  Use pain medication only under direct supervision at the lowest possible dose needed to control your pain.   Take any prescribed medications only as directed.  Home care instructions:  Follow any educational materials contained in this packet.  BE VERY CAREFUL not to take multiple medicines containing Tylenol (also called acetaminophen). Doing so can lead to an overdose which can damage your liver and cause liver failure and possibly death.   Follow-up instructions: Please follow-up with your primary care provider in the next 3 days for further evaluation of your symptoms.   See the spine doctor listed in 1 week for recheck.   Return instructions:  SEEK IMMEDIATE MEDICAL ATTENTION IF:  There is confusion or drowsiness (although children frequently become drowsy after injury).   You cannot awaken the injured person.   You have more than one episode of vomiting.   You notice dizziness or unsteadiness which is  getting worse, or inability to walk.   You have convulsions or unconsciousness.   You experience severe, persistent headaches not relieved by Tylenol.  You cannot use arms or legs normally.   There are changes in pupil sizes. (This is the black center in the colored part of the eye)   There is clear or bloody discharge from the nose or ears.   You have change in speech, vision, swallowing, or understanding.   Localized weakness, numbness, tingling, or change in bowel or bladder control.  You have any other emergent concerns.  Additional Information: You have had a head injury which does not appear to require admission at this time.  Your vital signs today were: BP 157/75 mmHg   Pulse 77   Temp(Src) 97.9 F (36.6 C) (Oral)   Resp 13   SpO2 96% If your blood pressure (BP) was elevated above 135/85 this visit, please have this repeated by your doctor within one month. --------------

## 2016-02-17 NOTE — ED Notes (Signed)
Per EMS - pt was outside painting a dump truck. Pt was standing on an 40ft ladder, ladder fell over and pt fell and hit buttocks and neck on gas tank of dump truck. Pt c/o neck pain and pain between shoulders. Pt a&o x 4. Pt ambulatory upon EMS arrival. Denies LOC/dizziness/lightheadedness. BP 182/66. Pt takes Eliquis.

## 2016-02-24 DIAGNOSIS — S12501D Unspecified nondisplaced fracture of sixth cervical vertebra, subsequent encounter for fracture with routine healing: Secondary | ICD-10-CM | POA: Diagnosis not present

## 2016-02-24 DIAGNOSIS — Z6829 Body mass index (BMI) 29.0-29.9, adult: Secondary | ICD-10-CM | POA: Diagnosis not present

## 2016-02-24 DIAGNOSIS — I10 Essential (primary) hypertension: Secondary | ICD-10-CM | POA: Diagnosis not present

## 2016-02-25 DIAGNOSIS — E11311 Type 2 diabetes mellitus with unspecified diabetic retinopathy with macular edema: Secondary | ICD-10-CM | POA: Diagnosis not present

## 2016-02-25 DIAGNOSIS — H31012 Macula scars of posterior pole (postinflammatory) (post-traumatic), left eye: Secondary | ICD-10-CM | POA: Diagnosis not present

## 2016-02-25 DIAGNOSIS — Z961 Presence of intraocular lens: Secondary | ICD-10-CM | POA: Insufficient documentation

## 2016-02-25 DIAGNOSIS — E113513 Type 2 diabetes mellitus with proliferative diabetic retinopathy with macular edema, bilateral: Secondary | ICD-10-CM | POA: Insufficient documentation

## 2016-02-25 LAB — HM DIABETES EYE EXAM

## 2016-03-31 ENCOUNTER — Other Ambulatory Visit: Payer: Medicare Other

## 2016-04-03 ENCOUNTER — Ambulatory Visit (INDEPENDENT_AMBULATORY_CARE_PROVIDER_SITE_OTHER): Payer: Medicare Other | Admitting: Endocrinology

## 2016-04-03 ENCOUNTER — Encounter: Payer: Self-pay | Admitting: Endocrinology

## 2016-04-03 VITALS — BP 130/62 | HR 96 | Temp 98.1°F | Resp 16 | Ht 72.0 in | Wt 201.8 lb

## 2016-04-03 DIAGNOSIS — I2510 Atherosclerotic heart disease of native coronary artery without angina pectoris: Secondary | ICD-10-CM | POA: Diagnosis not present

## 2016-04-03 DIAGNOSIS — E7800 Pure hypercholesterolemia, unspecified: Secondary | ICD-10-CM | POA: Diagnosis not present

## 2016-04-03 DIAGNOSIS — E038 Other specified hypothyroidism: Secondary | ICD-10-CM | POA: Diagnosis not present

## 2016-04-03 DIAGNOSIS — E119 Type 2 diabetes mellitus without complications: Secondary | ICD-10-CM | POA: Diagnosis not present

## 2016-04-03 LAB — BASIC METABOLIC PANEL
BUN: 27 mg/dL — ABNORMAL HIGH (ref 6–23)
CO2: 27 mEq/L (ref 19–32)
Calcium: 9.6 mg/dL (ref 8.4–10.5)
Chloride: 105 mEq/L (ref 96–112)
Creatinine, Ser: 1.25 mg/dL (ref 0.40–1.50)
GFR: 59.08 mL/min — ABNORMAL LOW (ref >60.00)
Glucose, Bld: 180 mg/dL — ABNORMAL HIGH (ref 70–99)
Potassium: 4.8 mEq/L (ref 3.5–5.1)
Sodium: 140 mEq/L (ref 135–145)

## 2016-04-03 LAB — LIPID PANEL
Cholesterol: 137 mg/dL (ref 0–200)
HDL: 51.8 mg/dL (ref >39.00)
LDL Cholesterol: 69 mg/dL (ref 0–99)
NonHDL: 85.05
Total CHOL/HDL Ratio: 3
Triglycerides: 79 mg/dL (ref 0.0–149.0)
VLDL: 15.8 mg/dL (ref 0.0–40.0)

## 2016-04-03 LAB — T4, FREE: Free T4: 0.88 ng/dL (ref 0.60–1.60)

## 2016-04-03 LAB — POCT GLYCOSYLATED HEMOGLOBIN (HGB A1C): Hemoglobin A1C: 6.3

## 2016-04-03 LAB — TSH: TSH: 2.62 u[IU]/mL (ref 0.35–4.50)

## 2016-04-03 MED ORDER — METFORMIN HCL ER 750 MG PO TB24: 1500.0000 mg | ORAL_TABLET | Freq: Every day | ORAL | Status: DC

## 2016-04-03 NOTE — Patient Instructions (Signed)
Check blood sugars on waking up 3  times a week Also check blood sugars about 2 hours after a meal and do this after different meals by rotation  Recommended blood sugar levels on waking up is 90-130 and about 2 hours after meal is 130-160  Please bring your blood sugar monitor to each visit, thank you  Must take insulin 10 MIN BEFORE THE MEAL  METFORMIN, NEW RX, TAKE 2 at supper

## 2016-04-03 NOTE — Progress Notes (Signed)
Patient ID: Steve Bailey, male   DOB: 18-Mar-1934, 82 y.o.   MRN: [id number]    Reason for Appointment: Diabetes follow-up   History of Present Illness   Diagnosis: Type 2 DIABETES MELITUS, date of diagnosis: 1995          Oral hypoglycemic drugs: None      Insulin regimen: Humulin 70/30,  20 units  a.c. twice a day with pens       History:  His blood sugars have been usually well controlled over the last few years with A1c near normal Previously was taking Actos for insulin resistance  He refuses to take this because of perceived side effects His A1c had gone up to 6.9 previously but more consistently normal at 6.3 He has again not brought his blood sugar monitor  for download   Current blood sugar patterns and problems:  He is checking his blood sugars very irregularly and infrequently   He said that sometimes he will take his insulin after eating instead of before despite instructions on taking it before meals; this may occasionally cause high readings at home over 200  He thinks fasting blood sugars are between 90-100   His weight is significantly improved from increased activity and watching portions  No reported hypoglycemia   Glucometer:  Freestyle  Hypoglycemia frequency: None . He  will take a sweet drink when his blood sugar feels low   Carbohydrate intake: at meals: Mostly with vegetables, also getting some lean meats like Chicken and pork but not consistently.   Egg, bacon, grits in am  Meals may be occasionally higher in fat.  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  Always use your most recent med list.               acetaminophen 500 MG tablet  Commonly known as:  TYLENOL  Take 1,000 mg by mouth every 6 (six) hours as needed for moderate pain.     alfuzosin 10 MG 24 hr tablet  Commonly known as:  UROXATRAL  Take 10 mg by mouth at bedtime. Reported on 12/18/2015     ALKA-SELTZER PLUS COLD & COUGH 03-24-09-325 MG Caps  Generic drug:  Phenyleph-CPM-DM-APAP  Take 2 capsules by mouth daily as needed (FOR COLD).     amLODipine 10 MG tablet  Commonly known as:  NORVASC  Take 1 tablet (10 mg total) by mouth daily.     apixaban 5 MG Tabs tablet  Commonly known as:  ELIQUIS  Take 1 tablet (5 mg total) by mouth 2 (two) times daily.     atorvastatin 40 MG tablet  Commonly known as:  LIPITOR  TAKE 1 TABLET EVERY DAY     Cinnamon 500 MG capsule  Take 500 mg by mouth 2 (two) times daily.  finasteride 5 MG tablet  Commonly known as:  PROSCAR  Take 5 mg by mouth every morning. Reported on 12/18/2015     HUMULIN 70/30 KWIKPEN (70-30) 100 UNIT/ML PEN  Generic drug:  Insulin Isophane & Regular Human  INJECT 18 UNITS INTO THE SKIN EVERY MORNING & 22 UNITS AT SUPPER DAILY - NEEDS APPT     HYDROcodone-acetaminophen 5-325 MG tablet  Commonly known as:  NORCO/VICODIN  Take 0.5-1 tablets every 6 hours as needed for severe pain     lisinopril 40 MG tablet  Commonly known as:  PRINIVIL,ZESTRIL  Take 1 tablet (40 mg total) by mouth daily.     metFORMIN 750 MG 24 hr tablet  Commonly known as:  GLUCOPHAGE-XR  Take 2 tablets (1,500 mg total) by mouth daily with breakfast.     nitroGLYCERIN 0.4 MG SL tablet  Commonly known as:  NITROSTAT  Place 1 tablet (0.4 mg total) under the tongue every 5 (five) minutes as needed for chest pain.     traMADol 50 MG tablet  Commonly known as:  ULTRAM  Take 50 mg by mouth  every 6 (six) hours as needed. Reported on 04/03/2016        Allergies:  Allergies  Allergen Reactions  . Cephalexin Nausea And Vomiting    Past Medical History  Diagnosis Date  . DM (diabetes mellitus) ([place])   . Hyperlipidemia   . Tobacco abuse   . CAD (coronary artery disease)     remote CABG in 1997; prior PCI to the LAD i n1990; Nuclear study in February of 2010 with an EF of 60% with repeat cath showing distal LAD disease. Managed medically  . Prostate cancer ([place]) 09/02/12    Gleason 3+4=7, vol 103 cc  . Atrial fibrillation ([place]) dx 10/10/12    started on med 10/10/12  . Hypertension dx 10/10/12  . Thyroid disease dx 10/10/12    started on Synthroid  . History of radiation therapy 11/24/12-01/18/13    Prostate 78Gy/50fx    Past Surgical History  Procedure Laterality Date  . Coronary artery bypass graft  11-1995    shows distal LAD disease, without recurrent symptoms of angina with questionable mild apical ischemia but with normal EF   . Angioplasty      of his left anterior descending previously in 1990.  . Cardiovascular stress test  12-2008    showed EF 60%  . Cataract extraction  2010    bilateral  . Knee surgery      left, arthroscopic  . Carpal tunnel release      right hand  . Esophagogastroduodenoscopy (egd) with esophageal dilation  05/2012    Family History  Problem Relation Age of Onset  . Stroke Father   . Heart attack Mother   . Cancer Sister     lung  . Cancer Brother     prostate  . Diabetes Brother   . Stroke Brother   . Stroke Brother   . Heart disease Brother   . Heart disease Brother   . Heart attack Sister   . Heart disease Sister   . Heart attack Sister   . Cancer Sister     breast  . Heart disease Sister   . Heart attack Sister   . Heart disease Sister   . Heart disease Sister     Social History:  reports that he quit smoking about 36 years ago. He quit smokeless tobacco use about 37 years ago. His smokeless tobacco use  included Chew. He reports that he does not drink alcohol or use illicit drugs.  ROS   Eye exam last in 6/16   He has a diagnosis of hypothyroidism probably diagnosed in 2013 by cardiologist. He is not on 25 g of levothyroxine which has been prescribed by cardiologist before with mild increase in TSH   Lab Results  Component Value Date   FREET4 0.88 04/03/2016   FREET4 1.05 09/06/2015   TSH 2.62 04/03/2016   TSH 2.42 09/06/2015   TSH 4.25 02/25/2015           HYPERTENSION:  Has been present for several years.    Currently taking lisinopril 40 mg, amlodipine and metoprolol.  Also followed by cardiologist  HYPERLIPIDEMIA:    he has had high LDL and this is fairly well-controlled with 40 mg Lipitor, followed by cardiologist   Also has persistently low HDL   Lab Results  Component Value Date   CHOL 137 04/03/2016   HDL 51.80 04/03/2016   LDLCALC 69 04/03/2016   TRIG 79.0 04/03/2016   CHOLHDL 3 04/03/2016    Diabetic foot exam done in 7/16 showed normal monofilament sensation and pulses   Examination:   BP 130/62 mmHg  Pulse 96  Temp(Src) 98.1 F (36.7 C)  Resp 16  Ht 6' (1.829 m)  Wt 201 lb 12.8 oz (91.536 kg)  BMI 27.36 kg/m2  SpO2 98%  Body mass index is 27.36 kg/(m^2).     Assesment/PLAN:  1. Diabetes type 2 with BMI 27  See history of present illness for detailed discussion of his current management, blood sugar patterns and problems identified His A1c is normal again today although he has not brought his monitor for download He does need to take his premixed insulin before eating rather than at variable times discussed that this is slow in onset of action He had previously been advised to take metformin but has not started this, new prescription sent Advised him to check more readings after meals and reminded him to take insulin before eating Bring his monitor on the next visit   2.  Hypertension: Well controlled  3.?  Hypothyroidism: He is reportedly  not taking any levothyroxine and we will check his TSH today  Patient Instructions  Check blood sugars on waking up 3  times a week Also check blood sugars about 2 hours after a meal and do this after different meals by rotation  Recommended blood sugar levels on waking up is 90-130 and about 2 hours after meal is 130-160  Please bring your blood sugar monitor to each visit, thank you  Must take insulin 10 MIN BEFORE THE MEAL  METFORMIN, NEW RX, TAKE 2 at supper      [facility] 04/05/2016, 5:58 PM   Addendum: TSH is normal, no need to resume levothyroxine LDL below 70, at target

## 2016-05-01 ENCOUNTER — Other Ambulatory Visit: Payer: Self-pay | Admitting: Cardiovascular Disease

## 2016-05-05 ENCOUNTER — Other Ambulatory Visit: Payer: Self-pay | Admitting: *Deleted

## 2016-05-05 MED ORDER — AMLODIPINE BESYLATE 10 MG PO TABS: 10.0000 mg | ORAL_TABLET | Freq: Every day | ORAL | Status: DC

## 2016-06-04 DIAGNOSIS — C61 Malignant neoplasm of prostate: Secondary | ICD-10-CM | POA: Diagnosis not present

## 2016-06-11 ENCOUNTER — Encounter: Payer: Self-pay | Admitting: Vascular Surgery

## 2016-06-11 DIAGNOSIS — N401 Enlarged prostate with lower urinary tract symptoms: Secondary | ICD-10-CM | POA: Diagnosis not present

## 2016-06-11 DIAGNOSIS — R3912 Poor urinary stream: Secondary | ICD-10-CM | POA: Diagnosis not present

## 2016-06-11 DIAGNOSIS — C61 Malignant neoplasm of prostate: Secondary | ICD-10-CM | POA: Diagnosis not present

## 2016-06-18 NOTE — Progress Notes (Signed)
Established Abdominal Aortic Aneurysm  History of Present Illness  The patient is a 82 y.o. (June 27, 1934) male who presents with chief complaint: follow up for AAA.  Previous studies demonstrate an AAA, measuring 4-5 cm.  The patient does have chronic back pain without or abdominal pain.  The patient is a former smoker.  The patient has had no embolic sx.  The patient's PMH, PSH, SH, and FamHx are unchanged from 12/18/15.  Current Outpatient Prescriptions  Medication Sig Dispense Refill  . acetaminophen (TYLENOL) 500 MG tablet Take 1,000 mg by mouth every 6 (six) hours as needed for moderate pain.     Marland Kitchen alfuzosin (UROXATRAL) 10 MG 24 hr tablet Take 10 mg by mouth at bedtime. Reported on 12/18/2015    . amLODipine (NORVASC) 10 MG tablet Take 1 tablet (10 mg total) by mouth daily. 90 tablet 3  . apixaban (ELIQUIS) 5 MG TABS tablet Take 1 tablet (5 mg total) by mouth 2 (two) times daily. 60 tablet 6  . atorvastatin (LIPITOR) 40 MG tablet TAKE 1 TABLET EVERY DAY (Patient taking differently: Take 40 mg by mouth daily at 6 PM. TAKE 1 TABLET EVERY DAY) 90 tablet 3  . Cinnamon 500 MG capsule Take 500 mg by mouth 2 (two) times daily.    . finasteride (PROSCAR) 5 MG tablet Take 5 mg by mouth every morning. Reported on 12/18/2015    . HUMULIN 70/30 KWIKPEN (70-30) 100 UNIT/ML PEN INJECT 18 UNITS INTO THE SKIN EVERY MORNING & 22 UNITS AT SUPPER DAILY - NEEDS APPT (Patient taking differently: INJECT 20 UNITS INTO THE SKIN EVERY MORNING & 20 UNITS AT SUPPER DAILY - NEEDS APPT) 45 mL 1  . HYDROcodone-acetaminophen (NORCO/VICODIN) 5-325 MG tablet Take 0.5-1 tablets every 6 hours as needed for severe pain (Patient not taking: Reported on 04/03/2016) 10 tablet 0  . lisinopril (PRINIVIL,ZESTRIL) 40 MG tablet TAKE 1 TABLET EVERY DAY 90 tablet 3  . metFORMIN (GLUCOPHAGE-XR) 750 MG 24 hr tablet Take 2 tablets (1,500 mg total) by mouth daily with breakfast. 60 tablet 2  . nitroGLYCERIN (NITROSTAT) 0.4 MG SL tablet  Place 1 tablet (0.4 mg total) under the tongue every 5 (five) minutes as needed for chest pain. 25 tablet 6  . Phenyleph-CPM-DM-APAP (ALKA-SELTZER PLUS COLD & COUGH) 03-24-09-325 MG CAPS Take 2 capsules by mouth daily as needed (FOR COLD).     Marland Kitchen traMADol (ULTRAM) 50 MG tablet Take 50 mg by mouth every 6 (six) hours as needed. Reported on 04/03/2016  0   No current facility-administered medications for this visit.     Allergies  Allergen Reactions  . Cephalexin Nausea And Vomiting    On ROS today: no back pain, no embolic sx   Physical Examination  Vitals:   06/19/16 0908 06/19/16 0913  BP: (!) 185/90 (!) 189/95  Pulse: 77   SpO2: 99%   Weight: 199 lb (90.3 kg)   Height: 6' (1.829 m)    Body mass index is 26.99 kg/m.  General: A&O x 3, WDWN  Pulmonary: Sym exp, good air movt, CTAB, no rales, rhonchi, & wheezing  Cardiac: RRR, Nl S1, S2, no Murmurs, rubs or gallops  Vascular: Vessel Right Left  Radial Palpable Palpable  Brachial Palpable Palpable  Carotid Palpable, without bruit Palpable, without bruit  Aorta Not palpable N/A  Femoral Palpable Palpable  Popliteal Not palpable Not palpable  PT Faintly Palpable Faintly Palpable  DP Not Palpable Not Palpable   Gastrointestinal: soft, NTND, no G/R, no  HSM, no masses, no CVAT B  Musculoskeletal: M/S 5/5 throughout , Extremities without ischemic changes   Neurologic:  Pain and light touch intact in extremities , Motor exam as listed above  Non-Invasive Vascular Imaging  AAA Duplex (06/18/2016)  Previous size: 4.6 cm (CT)  Current size:  4.36 x 4.43 cm   R CIA: 1.1 cm x 1.1 cm  L CIA: 0.8 cm x 0.8 cm  Medical Decision Making  The patient is a 82 y.o. male who presents with: asymptomatic small AAA with stable size.   Based on this patient's exam and diagnostic studies, he needs q6 month AAA duplex.  The threshold for repair is AAA size > 5.5 cm, growth > 1 cm/yr, and symptomatic status.  I emphasized the  importance of maximal medical management including strict control of blood pressure, blood glucose, and lipid levels, antiplatelet agents, obtaining regular exercise, and cessation of smoking.   Thank you for allowing [place] to participate in this patient's care.   Adele Barthel, MD, FACS [facility] Office: [phone] Pager: [phone]

## 2016-06-19 ENCOUNTER — Ambulatory Visit (INDEPENDENT_AMBULATORY_CARE_PROVIDER_SITE_OTHER): Payer: Medicare Other | Admitting: Vascular Surgery

## 2016-06-19 ENCOUNTER — Encounter: Payer: Self-pay | Admitting: Vascular Surgery

## 2016-06-19 ENCOUNTER — Ambulatory Visit (HOSPITAL_COMMUNITY)
Admission: RE | Admit: 2016-06-19 | Discharge: 2016-06-19 | Disposition: A | Discharge status: Home / Self Care | Payer: Medicare Other | Source: Ambulatory Visit | Attending: Vascular Surgery | Admitting: Vascular Surgery

## 2016-06-19 VITALS — BP 189/95 | HR 77 | Ht 72.0 in | Wt 199.0 lb

## 2016-06-19 DIAGNOSIS — E785 Hyperlipidemia, unspecified: Secondary | ICD-10-CM | POA: Insufficient documentation

## 2016-06-19 DIAGNOSIS — I714 Abdominal aortic aneurysm, without rupture, unspecified: Secondary | ICD-10-CM

## 2016-06-19 DIAGNOSIS — I2510 Atherosclerotic heart disease of native coronary artery without angina pectoris: Secondary | ICD-10-CM

## 2016-06-19 DIAGNOSIS — E079 Disorder of thyroid, unspecified: Secondary | ICD-10-CM | POA: Diagnosis not present

## 2016-06-19 DIAGNOSIS — E119 Type 2 diabetes mellitus without complications: Secondary | ICD-10-CM | POA: Insufficient documentation

## 2016-06-19 DIAGNOSIS — Z720 Tobacco use: Secondary | ICD-10-CM | POA: Insufficient documentation

## 2016-06-19 DIAGNOSIS — I10 Essential (primary) hypertension: Secondary | ICD-10-CM | POA: Diagnosis not present

## 2016-06-23 ENCOUNTER — Ambulatory Visit: Payer: Medicare Other | Admitting: Cardiovascular Disease

## 2016-06-23 ENCOUNTER — Other Ambulatory Visit: Payer: Self-pay | Admitting: Endocrinology

## 2016-06-24 ENCOUNTER — Other Ambulatory Visit: Payer: Self-pay | Admitting: *Deleted

## 2016-06-24 DIAGNOSIS — I714 Abdominal aortic aneurysm, without rupture, unspecified: Secondary | ICD-10-CM

## 2016-06-25 ENCOUNTER — Encounter: Payer: Self-pay | Admitting: Cardiovascular Disease

## 2016-06-25 ENCOUNTER — Ambulatory Visit (INDEPENDENT_AMBULATORY_CARE_PROVIDER_SITE_OTHER): Payer: Medicare Other | Admitting: Cardiovascular Disease

## 2016-06-25 VITALS — BP 160/98 | HR 82 | Ht 72.0 in | Wt 200.0 lb

## 2016-06-25 DIAGNOSIS — I25709 Atherosclerosis of coronary artery bypass graft(s), unspecified, with unspecified angina pectoris: Secondary | ICD-10-CM | POA: Diagnosis not present

## 2016-06-25 DIAGNOSIS — I714 Abdominal aortic aneurysm, without rupture, unspecified: Secondary | ICD-10-CM

## 2016-06-25 DIAGNOSIS — I10 Essential (primary) hypertension: Secondary | ICD-10-CM | POA: Diagnosis not present

## 2016-06-25 DIAGNOSIS — I482 Chronic atrial fibrillation, unspecified: Secondary | ICD-10-CM

## 2016-06-25 DIAGNOSIS — E785 Hyperlipidemia, unspecified: Secondary | ICD-10-CM

## 2016-06-25 NOTE — Patient Instructions (Signed)
Your physician wants you to follow-up in: 6 Month Dr. Bronson Ing.  You will receive a reminder letter in the mail two months in advance. If you don't receive a letter, please call our office to schedule the follow-up appointment.  Your physician recommends that you continue on your current medications as directed. Please refer to the Current Medication list given to you today.  Please call office with current Medication list.   If you need a refill on your cardiac medications before your next appointment, please call your pharmacy.  Thank you for choosing [facility]!

## 2016-06-25 NOTE — Progress Notes (Signed)
SUBJECTIVE: Steve Bailey presents for routine follow-up. He has a history of coronary artery disease and coronary artery bypass graft surgery, with his most recent cardiac catheterization performed in 2010. He also has hypertension, hyperlipidemia, IDDM, and atrial fibrillation. He is noted to be noncompliant with his medication regimen.   He denies chest pain and shortness of breath. Says the pharmacy does not have all of his medications. His daughter is present.   Review of Systems: As per "subjective", otherwise negative.  Allergies  Allergen Reactions  . Cephalexin Nausea And Vomiting    Current Outpatient Prescriptions  Medication Sig Dispense Refill  . acetaminophen (TYLENOL) 500 MG tablet Take 1,000 mg by mouth every 6 (six) hours as needed for moderate pain.     Marland Kitchen alfuzosin (UROXATRAL) 10 MG 24 hr tablet Take 10 mg by mouth at bedtime. Reported on 12/18/2015    . amLODipine (NORVASC) 10 MG tablet Take 1 tablet (10 mg total) by mouth daily. 90 tablet 3  . apixaban (ELIQUIS) 5 MG TABS tablet Take 1 tablet (5 mg total) by mouth 2 (two) times daily. 60 tablet 6  . atorvastatin (LIPITOR) 40 MG tablet TAKE 1 TABLET EVERY DAY (Patient taking differently: Take 40 mg by mouth daily at 6 PM. TAKE 1 TABLET EVERY DAY) 90 tablet 3  . Cinnamon 500 MG capsule Take 500 mg by mouth 2 (two) times daily.    . finasteride (PROSCAR) 5 MG tablet Take 5 mg by mouth every morning. Reported on 12/18/2015    . HUMULIN 70/30 KWIKPEN (70-30) 100 UNIT/ML PEN INJECT 18 UNITS INTO THE SKIN EVERY MORNING & 22 UNITS AT SUPPER DAILY - NEEDS APPT (Patient taking differently: INJECT 20 UNITS INTO THE SKIN EVERY MORNING & 20 UNITS AT SUPPER DAILY - NEEDS APPT) 45 mL 1  . HYDROcodone-acetaminophen (NORCO/VICODIN) 5-325 MG tablet Take 0.5-1 tablets every 6 hours as needed for severe pain 10 tablet 0  . lisinopril (PRINIVIL,ZESTRIL) 40 MG tablet TAKE 1 TABLET EVERY DAY 90 tablet 3  . metFORMIN (GLUCOPHAGE-XR) 750 MG  24 hr tablet TAKE 2 TABLETS (1,500 MG TOTAL) BY MOUTH DAILY WITH BREAKFAST. 60 tablet 2  . nitroGLYCERIN (NITROSTAT) 0.4 MG SL tablet Place 1 tablet (0.4 mg total) under the tongue every 5 (five) minutes as needed for chest pain. 25 tablet 6  . Phenyleph-CPM-DM-APAP (ALKA-SELTZER PLUS COLD & COUGH) 03-24-09-325 MG CAPS Take 2 capsules by mouth daily as needed (FOR COLD).     Marland Kitchen traMADol (ULTRAM) 50 MG tablet Take 50 mg by mouth every 6 (six) hours as needed. Reported on 04/03/2016  0   No current facility-administered medications for this visit.     Past Medical History:  Diagnosis Date  . AAA (abdominal aortic aneurysm) ([place])   . Atrial fibrillation ([place]) dx 10/10/12   started on med 10/10/12  . CAD (coronary artery disease)    remote CABG in 1997; prior PCI to the LAD i n1990; Nuclear study in February of 2010 with an EF of 60% with repeat cath showing distal LAD disease. Managed medically  . DM (diabetes mellitus) ([place])   . History of radiation therapy 11/24/12-01/18/13   Prostate 78Gy/52fx  . Hyperlipidemia   . Hypertension dx 10/10/12  . Prostate cancer ([place]) 09/02/12   Gleason 3+4=7, vol 103 cc  . Thyroid disease dx 10/10/12   started on Synthroid  . Tobacco abuse     Past Surgical History:  Procedure Laterality Date  . ANGIOPLASTY  of his left anterior descending previously in 1990.  Marland Kitchen CARDIOVASCULAR STRESS TEST  12-2008   showed EF 60%  . CARPAL TUNNEL RELEASE     right hand  . CATARACT EXTRACTION  2010   bilateral  . CORONARY ARTERY BYPASS GRAFT  11-1995   shows distal LAD disease, without recurrent symptoms of angina with questionable mild apical ischemia but with normal EF   . ESOPHAGOGASTRODUODENOSCOPY (EGD) WITH ESOPHAGEAL DILATION  05/2012  . KNEE SURGERY     left, arthroscopic    Social History   Social History  . Marital status: Married    Spouse name: N/A  . Number of children: N/A  . Years of education: N/A   Occupational History  . Not on file.    Social History Main Topics  . Smoking status: Former Smoker    Quit date: 10/14/1979  . Smokeless tobacco: Former Systems developer    Types: Chew    Quit date: 11/23/1978  . Alcohol use No  . Drug use: No  . Sexual activity: No   Other Topics Concern  . Not on file   Social History Narrative  . No narrative on file     Vitals:   06/25/16 1600  BP: (!) 160/98  Pulse: 82  SpO2: 98%  Weight: 200 lb (90.7 kg)  Height: 6' (1.829 m)    PHYSICAL EXAM General: NAD HEENT: Normal. Neck: No JVD, no thyromegaly. Lungs: Clear to auscultation bilaterally with normal respiratory effort. CV: Nondisplaced PMI.  Regular rate and irregular rhythm, normal S1/S2, no S3, no murmur. No pretibial or periankle edema.    Abdomen: Obese.  Neurologic: Alert and oriented.  Psych: Normal affect. Skin: Normal. Musculoskeletal: No gross deformities.    ECG: Most recent ECG reviewed.      ASSESSMENT AND PLAN: 1. CAD s/p CABG: Symptomatically stable. Continue Lipitor and metoprolol. As he is on Eliquis, I am avoiding ASA.  2. Essential HTN: Elevated today on lisinopril 40 mg and amlodipine 10 mg.Creatinine 1.25 04/03/16. Will initiate spironolactone 12.5 mg daily if he is indeed taking these meds. His daughter will clarify and call us back.  3. Hyperlipidemia:Lipids 04/03/16 total cholesterol 137, triglycerides 79, HDL 51, LDL 69. Continue Lipitor 40 mg.  4. Atrial fibrillation: HR controlled on Toprol-XL 100 mg daily. Anticoagulated with Eliquis. No changes.  5. AAA: 4.36 x 4.43 cm 06/18/16. Followed by vascular surgery.  Dispo: fu 6 months.  Kate Sable, M.D., F.A.C.C.

## 2016-06-29 ENCOUNTER — Telehealth: Payer: Self-pay | Admitting: Cardiovascular Disease

## 2016-06-29 MED ORDER — NITROGLYCERIN 0.4 MG SL SUBL: 0.4000 mg | SUBLINGUAL_TABLET | SUBLINGUAL | 3 refills | Status: DC | PRN

## 2016-06-29 MED ORDER — AMLODIPINE BESYLATE 5 MG PO TABS: 5.0000 mg | ORAL_TABLET | Freq: Every day | ORAL | 0 refills | Status: DC

## 2016-06-29 NOTE — Telephone Encounter (Signed)
Daughter Inez Catalina informed to start amlodipine 5 mg,she had just refilled his 10 mg dose,will cut tablets in half and use up.Also requested refill for NTG,done

## 2016-06-29 NOTE — Telephone Encounter (Signed)
Pt is not taking amlodipine,please advise

## 2016-06-29 NOTE — Telephone Encounter (Signed)
Start amlodipine 5mg daily

## 2016-07-20 ENCOUNTER — Other Ambulatory Visit: Payer: Self-pay

## 2016-07-20 MED ORDER — APIXABAN 5 MG PO TABS: 5.0000 mg | ORAL_TABLET | Freq: Two times a day (BID) | ORAL | 6 refills | Status: DC

## 2016-07-20 NOTE — Telephone Encounter (Signed)
Refill complete 

## 2016-08-04 ENCOUNTER — Ambulatory Visit: Payer: Medicare Other | Admitting: Endocrinology

## 2016-08-10 ENCOUNTER — Encounter: Payer: Self-pay | Admitting: Endocrinology

## 2016-08-10 ENCOUNTER — Ambulatory Visit (INDEPENDENT_AMBULATORY_CARE_PROVIDER_SITE_OTHER): Payer: Medicare Other | Admitting: Endocrinology

## 2016-08-10 VITALS — BP 162/74 | HR 84 | Temp 98.0°F | Resp 16 | Ht 72.0 in | Wt 198.6 lb

## 2016-08-10 DIAGNOSIS — E1169 Type 2 diabetes mellitus with other specified complication: Secondary | ICD-10-CM

## 2016-08-10 DIAGNOSIS — I10 Essential (primary) hypertension: Secondary | ICD-10-CM

## 2016-08-10 DIAGNOSIS — I25709 Atherosclerosis of coronary artery bypass graft(s), unspecified, with unspecified angina pectoris: Secondary | ICD-10-CM | POA: Diagnosis not present

## 2016-08-10 DIAGNOSIS — E1165 Type 2 diabetes mellitus with hyperglycemia: Secondary | ICD-10-CM | POA: Diagnosis not present

## 2016-08-10 DIAGNOSIS — Z23 Encounter for immunization: Secondary | ICD-10-CM

## 2016-08-10 LAB — POCT GLYCOSYLATED HEMOGLOBIN (HGB A1C): Hemoglobin A1C: 6

## 2016-08-10 MED ORDER — HYDROCHLOROTHIAZIDE 12.5 MG PO CAPS: 12.5000 mg | ORAL_CAPSULE | Freq: Every day | ORAL | 2 refills | Status: DC

## 2016-08-10 NOTE — Progress Notes (Signed)
Patient ID: Steve Bailey, male   DOB: February 16, 1934, 82 y.o.   MRN: [id number]    Reason for Appointment:  follow-up   History of Present Illness   Diagnosis: Type 2 DIABETES MELITUS, date of diagnosis: 1995          Oral hypoglycemic drugs: None      Insulin regimen: Humulin 70/30,  20 units  a.c. twice a day with pens       History:  His blood sugars have been usually well controlled over the last few years with A1c upper normal, now 6 Previously was taking Actos for insulin resistance  He refuses to take this because of perceived side effects  Current management, blood sugar patterns and problems:  He is checking his blood sugars very irregularly and infrequently and usually only about once or twice a month on looking at his meter.  However he does have an expired test strips   He still is up frequently taking his insulin after eating despite reminding him to take it before eating consistently  Also he will check his blood sugars sometimes 30 minutes after eating and some readings may be relatively high  His weight is stable, previously was well over 200 pounds  He thinks he is somewhat active but no specific exercise or brisk walking program   No reported hypoglycemia   Glucometer:  Freestyle  Recent evening blood sugars: 176-205  Hypoglycemia frequency: None . He  will take a sweet drink when his blood sugar feels low   Carbohydrate intake: at meals: Mostly with vegetables, also getting some lean meats like Chicken and pork but not consistently.   Egg, bacon, grits in am  Meals may be occasionally higher in fat.  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 Always use your most recent med list.          acetaminophen 500 MG tablet Commonly known as:  TYLENOL Take 1,000 mg by mouth every 6 (six) hours as needed for moderate pain.   alfuzosin 10 MG 24 hr tablet Commonly known as:  UROXATRAL Take 10 mg by mouth at bedtime. Reported on 12/18/2015   ALKA-SELTZER PLUS COLD & COUGH 03-24-09-325 MG Caps Generic drug:  Phenyleph-CPM-DM-APAP Take 2 capsules by mouth daily as needed (FOR COLD).   amLODipine 5 MG tablet Commonly known as:  NORVASC Take 1 tablet (5 mg total) by mouth daily.   apixaban 5 MG Tabs tablet Commonly known as:  ELIQUIS Take 1 tablet (5 mg total) by mouth 2 (two) times daily.   atorvastatin 40 MG tablet Commonly known as:  LIPITOR TAKE 1 TABLET EVERY DAY   Cinnamon 500 MG capsule Take 500 mg by mouth 2 (two) times daily.   finasteride 5 MG tablet Commonly known as:  PROSCAR  Take 5 mg by mouth every morning. Reported on 12/18/2015   HUMULIN 70/30 KWIKPEN (70-30) 100 UNIT/ML PEN Generic drug:  Insulin Isophane & Regular Human INJECT 18 UNITS INTO THE SKIN EVERY MORNING & 22 UNITS AT SUPPER DAILY - NEEDS APPT   hydrochlorothiazide 12.5 MG capsule Commonly known as:  MICROZIDE Take 1 capsule (12.5 mg total) by mouth daily.   HYDROcodone-acetaminophen 5-325 MG tablet Commonly known as:  NORCO/VICODIN Take 0.5-1 tablets every 6 hours as needed for severe pain   lisinopril 40 MG tablet Commonly known as:  PRINIVIL,ZESTRIL TAKE 1 TABLET EVERY DAY   metFORMIN 750 MG 24 hr tablet Commonly known as:  GLUCOPHAGE-XR TAKE 2 TABLETS (1,500 MG TOTAL) BY MOUTH DAILY WITH BREAKFAST.   nitroGLYCERIN 0.4 MG SL tablet Commonly known as:  NITROSTAT Place 1 tablet  (0.4 mg total) under the tongue every 5 (five) minutes as needed.   traMADol 50 MG tablet Commonly known as:  ULTRAM Take 50 mg by mouth every 6 (six) hours as needed. Reported on 04/03/2016       Allergies:  Allergies  Allergen Reactions  . Cephalexin Nausea And Vomiting    Past Medical History:  Diagnosis Date  . AAA (abdominal aortic aneurysm) ([place])   . Atrial fibrillation ([place]) dx 10/10/12   started on med 10/10/12  . CAD (coronary artery disease)    remote CABG in 1997; prior PCI to the LAD i n1990; Nuclear study in February of 2010 with an EF of 60% with repeat cath showing distal LAD disease. Managed medically  . DM (diabetes mellitus) ([place])   . History of radiation therapy 11/24/12-01/18/13   Prostate 78Gy/100fx  . Hyperlipidemia   . Hypertension dx 10/10/12  . Prostate cancer ([place]) 09/02/12   Gleason 3+4=7, vol 103 cc  . Thyroid disease dx 10/10/12   started on Synthroid  . Tobacco abuse     Past Surgical History:  Procedure Laterality Date  . ANGIOPLASTY     of his left anterior descending previously in 1990.  Marland Kitchen CARDIOVASCULAR STRESS TEST  12-2008   showed EF 60%  . CARPAL TUNNEL RELEASE     right hand  . CATARACT EXTRACTION  2010   bilateral  . CORONARY ARTERY BYPASS GRAFT  11-1995   shows distal LAD disease, without recurrent symptoms of angina with questionable mild apical ischemia but with normal EF   . ESOPHAGOGASTRODUODENOSCOPY (EGD) WITH ESOPHAGEAL DILATION  05/2012  . KNEE SURGERY     left, arthroscopic    Family History  Problem Relation Age of Onset  . Stroke Father   . Heart attack Mother   . Heart disease Mother   . Cancer Sister     lung  . Cancer Brother     prostate  . Diabetes Brother   . Stroke Brother   . Stroke Brother   . Heart disease Brother   . Heart disease Brother   . Heart attack Sister   . Heart disease Sister   . Heart attack Sister   . Cancer Sister     breast  . Heart disease Sister   . Heart attack Sister   .  Heart disease Sister   . Heart disease Sister     Social History:  reports that he quit smoking about 36 years ago. He quit smokeless tobacco use about 37 years ago. His smokeless tobacco use included Chew. He reports that he does not drink alcohol or use drugs.  ROS    Eye  exam last in 6/16  Previously had been prescribed 25 g levothyroxine by another cardiologist before with mild increase in TSH Subsequently TSH has been normal without any supplement   Lab Results  Component Value Date   FREET4 0.88 04/03/2016   FREET4 1.05 09/06/2015   TSH 2.62 04/03/2016   TSH 2.42 09/06/2015   TSH 4.25 02/25/2015           HYPERTENSION:  Has been present for several years.    Currently taking lisinopril 40 mg, amlodipine 5 mg He was seen by the cardiologist last month and note indicates that he was supposed to start Aldactone 12.5 mg but unclear if he is taking this as there is no prescription on record Last potassium was 4.8  BP at home upto 0000000 systolic, occasionally better  BP Readings from Last 3 Encounters:  08/10/16 (!) 162/74  06/25/16 (!) 160/98  06/19/16 (!) 189/95    HYPERLIPIDEMIA:    he has had high LDL and this is fairly well-controlled with 40 mg Lipitor, followed by cardiologist Also  Also has persistently low HDL   Lab Results  Component Value Date   CHOL 137 04/03/2016   HDL 51.80 04/03/2016   LDLCALC 69 04/03/2016   TRIG 79.0 04/03/2016   CHOLHDL 3 04/03/2016    Diabetic foot exam done in 9/17 showed normal monofilament sensation and decreased pulses   Examination:   BP (!) 162/74 (Cuff Size: Normal)   Pulse 84   Temp 98 F (36.7 C)   Resp 16   Ht 6' (1.829 m)   Wt 198 lb 9.6 oz (90.1 kg)   SpO2 98%   BMI 26.94 kg/m   Body mass index is 26.94 kg/m.   Diabetic Foot Exam - Simple   Simple Foot Form Diabetic Foot exam was performed with the following findings:  Yes 08/10/2016 10:26 AM  Visual Inspection No deformities, no ulcerations, no other  skin breakdown bilaterally:  Yes Sensation Testing Intact to touch and monofilament testing bilaterally:  Yes Pulse Check See comments:  Yes Comments Right history of tibialis present, left posterior to bolus 2/4, dorsalis pedis not palpable on either side      Assesment/PLAN:  1. Diabetes type 2 with BMI 27  See history of present illness for detailed discussion of his current management, blood sugar patterns and problems identified  His A1c is normal again today although he has not checked his blood sugars at home much As discussed above he is not usually following instructions for the timing of glucose monitoring or insulin injections and this was discussed again in detail  Initial check his blood sugars more consistently and discussed when to check them along with blood sugar targets He does take low-dose metformin which may be helping Also encouraged him to do some breast walking as he is not doing any programmed exercise   2.  Hypertension: Appears poorly controlled consistently with amlodipine and lisinopril 40 mg Since his potassium is high normal will have him use hydrochlorothiazide  12.5 mg in follow-up in 1 month for repeat blood pressure and electrolytes Discussed blood pressure targets and he will bring his monitor for comparison on the next visit  3.  Decreased pedal pulses: Does not have any symptoms of claudication and will continue to follow  Also advised him to bring his medications on the next visit for verification  Patient Instructions  CHECK your blood sugars at least twice a week regularly, alternating BEFORE breakfast and 2 hours after  any of your 3 meals  INSULIN: This needs to be taken 15-30 minutes before your planned meal not after. Continue taking metformin after eating  New blood pressure medicine: HYDROCHLOROTHIAZIDE to be taken at breakfast time  Continue lisinopril and AMLODIPINE 5 mg  Counseling time on subjects discussed above is over 50%  of today's 25 minute visit   Steve Bailey 08/10/2016, 11:59 AM

## 2016-08-10 NOTE — Patient Instructions (Addendum)
CHECK your blood sugars at least twice a week regularly, alternating BEFORE breakfast and 2 hours after any of your 3 meals  INSULIN: This needs to be taken 15-30 minutes before your planned meal not after. Continue taking metformin after eating  New blood pressure medicine: HYDROCHLOROTHIAZIDE to be taken at breakfast time  Continue lisinopril and AMLODIPINE 5 mg

## 2016-08-11 ENCOUNTER — Other Ambulatory Visit (INDEPENDENT_AMBULATORY_CARE_PROVIDER_SITE_OTHER): Payer: Medicare Other

## 2016-08-11 DIAGNOSIS — E119 Type 2 diabetes mellitus without complications: Secondary | ICD-10-CM | POA: Diagnosis not present

## 2016-08-11 LAB — COMPREHENSIVE METABOLIC PANEL
ALT: 14 U/L (ref 0–53)
AST: 16 U/L (ref 0–37)
Albumin: 4.1 g/dL (ref 3.5–5.2)
Alkaline Phosphatase: 85 U/L (ref 39–117)
BUN: 17 mg/dL (ref 6–23)
CO2: 32 mEq/L (ref 19–32)
Calcium: 9.2 mg/dL (ref 8.4–10.5)
Chloride: 102 mEq/L (ref 96–112)
Creatinine, Ser: 1.03 mg/dL (ref 0.40–1.50)
GFR: 73.8 mL/min (ref >60.00)
Glucose, Bld: 153 mg/dL — ABNORMAL HIGH (ref 70–99)
Potassium: 4.4 mEq/L (ref 3.5–5.1)
Sodium: 137 mEq/L (ref 135–145)
Total Bilirubin: 0.7 mg/dL (ref 0.2–1.2)
Total Protein: 6.9 g/dL (ref 6.0–8.3)

## 2016-08-11 LAB — MICROALBUMIN / CREATININE URINE RATIO
Creatinine,U: 76.4 mg/dL
Microalb Creat Ratio: 4.6 mg/g (ref 0.0–30.0)
Microalb, Ur: 3.5 mg/dL — ABNORMAL HIGH (ref 0.0–1.9)

## 2016-09-11 ENCOUNTER — Ambulatory Visit (INDEPENDENT_AMBULATORY_CARE_PROVIDER_SITE_OTHER): Payer: Medicare Other | Admitting: Endocrinology

## 2016-09-11 ENCOUNTER — Encounter: Payer: Self-pay | Admitting: Endocrinology

## 2016-09-11 VITALS — BP 126/64 | HR 83 | Temp 97.7°F | Resp 16 | Ht 72.0 in | Wt 203.0 lb

## 2016-09-11 DIAGNOSIS — I10 Essential (primary) hypertension: Secondary | ICD-10-CM | POA: Diagnosis not present

## 2016-09-11 DIAGNOSIS — I25709 Atherosclerosis of coronary artery bypass graft(s), unspecified, with unspecified angina pectoris: Secondary | ICD-10-CM | POA: Diagnosis not present

## 2016-09-11 NOTE — Progress Notes (Signed)
Patient ID: Steve Bailey, male   DOB: 1934/02/20, 82 y.o.   MRN: [id number]    Reason for Appointment:  follow-up of blood pressure  History of Present Illness   HYPERTENSION:   Has been present for several years.    On his visit in 07/2016 his blood pressure was significantly high at 0000000 systolic and also high in August.  Also had been running high at home Previously taking lisinopril 40 mg, amlodipine 5 mg  Since he had not started the low dose Aldactone from the cardiologist he was started on HCTZ 12.5 mg daily instead because of high normal potassium level of 4.8.  Blood pressure is significantly improved today and he has no orthostatic symptoms   BP Readings from Last 3 Encounters:  09/11/16 126/64  08/10/16 (!) 162/74  06/25/16 (!) 160/98    The following is a copy of the previous note:  Diagnosis: Type 2 DIABETES MELITUS, date of diagnosis: 1995          Oral hypoglycemic drugs: None      Insulin regimen: Humulin 70/30,  20 units  a.c. twice a day with pens       History:  His blood sugars have been usually well controlled over the last few years with A1c upper normal, now 6 Previously was taking Actos for insulin resistance  He refuses to take this because of perceived side effects  Current management, blood sugar patterns and problems:  He is checking his blood sugars very irregularly and infrequently and usually only about once or twice a month on looking at his meter.  However he does have an expired test strips   He still is up frequently taking his insulin after eating despite reminding him to take it before eating consistently  Also he will check his blood sugars sometimes 30 minutes after eating and some readings may be relatively high  His weight is stable, previously was well over 200 pounds  He thinks he is somewhat active but no specific exercise or brisk walking program   No reported hypoglycemia   Glucometer:  Freestyle  Recent  evening blood sugars: 176-205  Hypoglycemia frequency: None . He  will take a sweet drink when his blood sugar feels low   Carbohydrate intake: at meals: Mostly with vegetables, also getting some lean meats like Chicken and pork but not consistently.   Egg, bacon, grits in am  Meals may be occasionally higher in fat.  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 Always use your most recent med list.          acetaminophen 500 MG tablet Commonly known as:  TYLENOL Take 1,000 mg by mouth every 6 (six) hours as needed for moderate pain.   alfuzosin 10 MG 24 hr tablet Commonly known as:  UROXATRAL Take  10 mg by mouth at bedtime. Reported on 12/18/2015   ALKA-SELTZER PLUS COLD & COUGH 03-24-09-325 MG Caps Generic drug:  Phenyleph-CPM-DM-APAP Take 2 capsules by mouth daily as needed (FOR COLD).   amLODipine 5 MG tablet Commonly known as:  NORVASC Take 1 tablet (5 mg total) by mouth daily.   apixaban 5 MG Tabs tablet Commonly known as:  ELIQUIS Take 1 tablet (5 mg total) by mouth 2 (two) times daily.   atorvastatin 40 MG tablet Commonly known as:  LIPITOR TAKE 1 TABLET EVERY DAY   Cinnamon 500 MG capsule Take 500 mg by mouth 2 (two) times daily.   finasteride 5 MG tablet Commonly known as:  PROSCAR Take 5 mg by mouth every morning. Reported on 12/18/2015   HUMULIN 70/30 KWIKPEN (70-30) 100 UNIT/ML PEN Generic drug:  Insulin Isophane & Regular Human INJECT 18 UNITS INTO  THE SKIN EVERY MORNING & 22 UNITS AT SUPPER DAILY - NEEDS APPT   hydrochlorothiazide 12.5 MG capsule Commonly known as:  MICROZIDE Take 1 capsule (12.5 mg total) by mouth daily.   HYDROcodone-acetaminophen 5-325 MG tablet Commonly known as:  NORCO/VICODIN Take 0.5-1 tablets every 6 hours as needed for severe pain   lisinopril 40 MG tablet Commonly known as:  PRINIVIL,ZESTRIL TAKE 1 TABLET EVERY DAY   metFORMIN 750 MG 24 hr tablet Commonly known as:  GLUCOPHAGE-XR TAKE 2 TABLETS (1,500 MG TOTAL) BY MOUTH DAILY WITH BREAKFAST.   nitroGLYCERIN 0.4 MG SL tablet Commonly known as:  NITROSTAT Place 1 tablet (0.4 mg total) under the tongue every 5 (five) minutes as needed.   traMADol 50 MG tablet Commonly known as:  ULTRAM Take 50 mg by mouth every 6 (six) hours as needed. Reported on 04/03/2016       Allergies:  Allergies  Allergen Reactions  . Cephalexin Nausea And Vomiting    Past Medical History:  Diagnosis Date  . AAA (abdominal aortic aneurysm) ([place])   . Atrial fibrillation ([place]) dx 10/10/12   started on med 10/10/12  . CAD (coronary artery disease)    remote CABG in 1997; prior PCI to the LAD i n1990; Nuclear study in February of 2010 with an EF of 60% with repeat cath showing distal LAD disease. Managed medically  . DM (diabetes mellitus) ([place])   . History of radiation therapy 11/24/12-01/18/13   Prostate 78Gy/9fx  . Hyperlipidemia   . Hypertension dx 10/10/12  . Prostate cancer ([place]) 09/02/12   Gleason 3+4=7, vol 103 cc  . Thyroid disease dx 10/10/12   started on Synthroid  . Tobacco abuse     Past Surgical History:  Procedure Laterality Date  . ANGIOPLASTY     of his left anterior descending previously in 1990.  Marland Kitchen CARDIOVASCULAR STRESS TEST  12-2008   showed EF 60%  . CARPAL TUNNEL RELEASE     right hand  . CATARACT EXTRACTION  2010   bilateral  . CORONARY ARTERY BYPASS GRAFT  11-1995   shows distal LAD disease, without recurrent symptoms of angina with  questionable mild apical ischemia but with normal EF   . ESOPHAGOGASTRODUODENOSCOPY (EGD) WITH ESOPHAGEAL DILATION  05/2012  . KNEE SURGERY     left, arthroscopic    Family History  Problem Relation Age of Onset  . Stroke Father   . Heart attack Mother   . Heart disease Mother   . Cancer Sister     lung  . Cancer Brother     prostate  . Diabetes Brother   .  Stroke Brother   . Stroke Brother   . Heart disease Brother   . Heart disease Brother   . Heart attack Sister   . Heart disease Sister   . Heart attack Sister   . Cancer Sister     breast  . Heart disease Sister   . Heart attack Sister   . Heart disease Sister   . Heart disease Sister     Social History:  reports that he quit smoking about 36 years ago. He quit smokeless tobacco use about 37 years ago. His smokeless tobacco use included Chew. He reports that he does not drink alcohol or use drugs.  ROS    Eye exam last in 6/16   TSH has been normal without any supplement   Lab Results  Component Value Date   FREET4 0.88 04/03/2016   FREET4 1.05 09/06/2015   TSH 2.62 04/03/2016   TSH 2.42 09/06/2015   TSH 4.25 02/25/2015           HYPERLIPIDEMIA:    he has had high LDL and this is fairly well-controlled with 40 mg Lipitor, followed by cardiologist   Also has persistently low HDL   Lab Results  Component Value Date   CHOL 137 04/03/2016   HDL 51.80 04/03/2016   LDLCALC 69 04/03/2016   TRIG 79.0 04/03/2016   CHOLHDL 3 04/03/2016    Diabetic foot exam done in 9/17 showed normal monofilament sensation and decreased pulses   Examination:   BP 124/70   Pulse 83   Temp 97.7 F (36.5 C)   Resp 16   Ht 6' (1.829 m)   Wt 203 lb (92.1 kg)   SpO2 96%   BMI 27.53 kg/m   Body mass index is 27.53 kg/m.    Assesment/PLAN:    HYPERTENSION: Appears much better controlled with adding HCTZ 12.5 mg daily He has no orthostasis with this but need to continue monitoring blood pressure regularly including  at home Will also check electrolytes and renal function today  He will continue amlodipine and lisinopril 40 mg  He will follow-up for his diabetes as previously scheduled    Sinda Leedom 09/11/2016, 4:36 PM

## 2016-09-15 LAB — BASIC METABOLIC PANEL
BUN/Creatinine Ratio: 20 (ref 10–24)
BUN: 23 mg/dL (ref 8–27)
CO2: 25 mmol/L (ref 18–29)
Calcium: 9.3 mg/dL (ref 8.6–10.2)
Chloride: 101 mmol/L (ref 96–106)
Creatinine, Ser: 1.15 mg/dL (ref 0.76–1.27)
GFR calc Af Amer: 69 mL/min/{1.73_m2} (ref >59)
GFR calc non Af Amer: 60 mL/min/{1.73_m2} (ref >59)
Glucose: 121 mg/dL — ABNORMAL HIGH (ref 65–99)
Potassium: 3.8 mmol/L (ref 3.5–5.2)
Sodium: 140 mmol/L (ref 134–144)

## 2016-09-28 ENCOUNTER — Other Ambulatory Visit: Payer: Self-pay | Admitting: Endocrinology

## 2016-10-23 ENCOUNTER — Other Ambulatory Visit: Payer: Self-pay | Admitting: Endocrinology

## 2016-11-05 ENCOUNTER — Other Ambulatory Visit: Payer: Self-pay | Admitting: Endocrinology

## 2016-11-24 ENCOUNTER — Telehealth: Payer: Self-pay | Admitting: Cardiovascular Disease

## 2016-11-24 NOTE — Telephone Encounter (Signed)
Pt would like to know if he can switch his apixaban (ELIQUIS) 5 MG TABS tablet [id number]  To something cheaper

## 2016-11-24 NOTE — Telephone Encounter (Signed)
Daughter called and I offered Pt assistance program and she will bring me info and we will try to enroll pt

## 2016-12-21 ENCOUNTER — Encounter: Payer: Self-pay | Admitting: Vascular Surgery

## 2016-12-22 NOTE — Progress Notes (Signed)
Established Abdominal Aortic Aneurysm  History of Present Illness  The patient is a 83 y.o. (1934-03-21) male who presents with chief complaint: follow up for AAA.  Previous studies demonstrate an AAA, measuring 4.4 cm x 4.4 cm.  The patient does not have back or abdominal pain.  The patient is not a smoker.  The patient's PMH, PSH, SH, and FamHx are unchanged from 06/19/16.  Current Outpatient Prescriptions  Medication Sig Dispense Refill  . acetaminophen (TYLENOL) 500 MG tablet Take 1,000 mg by mouth every 6 (six) hours as needed for moderate pain.     Marland Kitchen alfuzosin (UROXATRAL) 10 MG 24 hr tablet Take 10 mg by mouth at bedtime. Reported on 12/18/2015    . amLODipine (NORVASC) 5 MG tablet Take 1 tablet (5 mg total) by mouth daily. 90 tablet 0  . apixaban (ELIQUIS) 5 MG TABS tablet Take 1 tablet (5 mg total) by mouth 2 (two) times daily. 60 tablet 6  . atorvastatin (LIPITOR) 40 MG tablet TAKE 1 TABLET EVERY DAY (Patient taking differently: Take 40 mg by mouth daily at 6 PM. TAKE 1 TABLET EVERY DAY) 90 tablet 3  . Cinnamon 500 MG capsule Take 500 mg by mouth 2 (two) times daily.    . finasteride (PROSCAR) 5 MG tablet Take 5 mg by mouth every morning. Reported on 12/18/2015    . HUMULIN 70/30 KWIKPEN (70-30) 100 UNIT/ML PEN INJECT 18 UNITS INTO THE SKIN EVERY MORNING & 22 UNITS AT SUPPER DAILY - NEEDS APPT 45 mL 2  . hydrochlorothiazide (MICROZIDE) 12.5 MG capsule TAKE ONE CAPSULE BY MOUTH EVERY DAY 30 capsule 2  . HYDROcodone-acetaminophen (NORCO/VICODIN) 5-325 MG tablet Take 0.5-1 tablets every 6 hours as needed for severe pain 10 tablet 0  . lisinopril (PRINIVIL,ZESTRIL) 40 MG tablet TAKE 1 TABLET EVERY DAY 90 tablet 3  . metFORMIN (GLUCOPHAGE-XR) 750 MG 24 hr tablet TAKE 2 TABLETS (1,500 MG TOTAL) BY MOUTH DAILY WITH BREAKFAST. 60 tablet 2  . nitroGLYCERIN (NITROSTAT) 0.4 MG SL tablet Place 1 tablet (0.4 mg total) under the tongue every 5 (five) minutes as needed. 25 tablet 3  .  Phenyleph-CPM-DM-APAP (ALKA-SELTZER PLUS COLD & COUGH) 03-24-09-325 MG CAPS Take 2 capsules by mouth daily as needed (FOR COLD).     Marland Kitchen traMADol (ULTRAM) 50 MG tablet Take 50 mg by mouth every 6 (six) hours as needed. Reported on 04/03/2016  0   No current facility-administered medications for this visit.     Allergies  Allergen Reactions  . Cephalexin Nausea And Vomiting    On ROS today: no thromboembolic sx, denies back or abd pain   Physical Examination  Vitals:   12/25/16 0852 12/25/16 0853  BP: (!) 148/91 (!) 145/85  Pulse: 77   Resp: 20   Temp: 97.6 F (36.4 C)   TempSrc: Oral   SpO2: 95%   Weight: 209 lb (94.8 kg)   Height: 6' (1.829 m)    Body mass index is 28.35 kg/m.  General: A&O x 3, WD, WN  Pulmonary: Sym exp, good air movt, CTAB, no rales, rhonchi, & wheezing  Cardiac: RRR, Nl S1, S2, no Murmurs, rubs or gallops  Vascular: Vessel Right Left  Radial Palpable Palpable  Brachial Palpable Palpable  Carotid Palpable, without bruit Palpable, without bruit  Aorta Not palpable N/A  Femoral Palpable Palpable  Popliteal Not palpable Not palpable  PT Not Palpable (shoes on) Not Palpable (shoes on)  DP Not Palpable (shoes on) Not Palpable (shoes on)  Gastrointestinal: soft, NTND, no G/R, no HSM, no masses, no CVAT B, +pannus  Musculoskeletal: M/S 5/5 throughout , Extremities without ischemic changes   Neurologic:  Pain and light touch intact in extremities , Motor exam as listed above   Non-Invasive Vascular Imaging  AAA Duplex (12/22/2016)  Previous size: 4.4 cm x 4.4 cm.(Date: 06/19/16)  Current size:  4.3 cm x 4.2 cm    Medical Decision Making  The patient is a 83 y.o. male who presents with: asymptomatic small AAA with stable size.   Based on this patient's exam and diagnostic studies, he needs annual surveillance.  The threshold for repair is AAA size > 5.5 cm, growth > 1 cm/yr, and symptomatic status.  I emphasized the importance of maximal  medical management including strict control of blood pressure, blood glucose, and lipid levels, antiplatelet agents, obtaining regular exercise, and cessation of smoking.    This patient will follow up my nurse practitioner in one year with the above study.  Thank you for allowing [place] to participate in this patient's care.   Adele Barthel, MD, FACS [facility] Office: [phone] Pager: [phone]

## 2016-12-24 ENCOUNTER — Other Ambulatory Visit (INDEPENDENT_AMBULATORY_CARE_PROVIDER_SITE_OTHER): Payer: Medicare Other

## 2016-12-24 ENCOUNTER — Other Ambulatory Visit: Payer: Self-pay | Admitting: Endocrinology

## 2016-12-24 DIAGNOSIS — C61 Malignant neoplasm of prostate: Secondary | ICD-10-CM | POA: Diagnosis not present

## 2016-12-24 DIAGNOSIS — I10 Essential (primary) hypertension: Secondary | ICD-10-CM

## 2016-12-24 LAB — BASIC METABOLIC PANEL
BUN: 19 mg/dL (ref 6–23)
CO2: 27 mEq/L (ref 19–32)
Calcium: 9.3 mg/dL (ref 8.4–10.5)
Chloride: 104 mEq/L (ref 96–112)
Creatinine, Ser: 1.36 mg/dL (ref 0.40–1.50)
GFR: 53.5 mL/min — ABNORMAL LOW (ref >60.00)
Glucose, Bld: 122 mg/dL — ABNORMAL HIGH (ref 70–99)
Potassium: 4.8 mEq/L (ref 3.5–5.1)
Sodium: 139 mEq/L (ref 135–145)

## 2016-12-25 ENCOUNTER — Ambulatory Visit (INDEPENDENT_AMBULATORY_CARE_PROVIDER_SITE_OTHER): Payer: Medicare Other | Admitting: Vascular Surgery

## 2016-12-25 ENCOUNTER — Ambulatory Visit (HOSPITAL_COMMUNITY)
Admission: RE | Admit: 2016-12-25 | Discharge: 2016-12-25 | Disposition: A | Discharge status: Home / Self Care | Payer: Medicare Other | Source: Ambulatory Visit | Attending: Vascular Surgery | Admitting: Vascular Surgery

## 2016-12-25 ENCOUNTER — Other Ambulatory Visit: Payer: Self-pay

## 2016-12-25 ENCOUNTER — Encounter: Payer: Self-pay | Admitting: Vascular Surgery

## 2016-12-25 VITALS — BP 145/85 | HR 77 | Temp 97.6°F | Resp 20 | Ht 72.0 in | Wt 209.0 lb

## 2016-12-25 DIAGNOSIS — I714 Abdominal aortic aneurysm, without rupture, unspecified: Secondary | ICD-10-CM

## 2016-12-28 ENCOUNTER — Encounter: Payer: Self-pay | Admitting: Endocrinology

## 2016-12-28 ENCOUNTER — Ambulatory Visit (INDEPENDENT_AMBULATORY_CARE_PROVIDER_SITE_OTHER): Payer: Medicare Other | Admitting: Endocrinology

## 2016-12-28 ENCOUNTER — Other Ambulatory Visit: Payer: Self-pay

## 2016-12-28 VITALS — BP 142/62 | HR 92 | Ht 72.0 in | Wt 212.0 lb

## 2016-12-28 DIAGNOSIS — E119 Type 2 diabetes mellitus without complications: Secondary | ICD-10-CM

## 2016-12-28 DIAGNOSIS — E538 Deficiency of other specified B group vitamins: Secondary | ICD-10-CM | POA: Diagnosis not present

## 2016-12-28 LAB — POCT GLYCOSYLATED HEMOGLOBIN (HGB A1C): Hemoglobin A1C: 6.3

## 2016-12-28 NOTE — Patient Instructions (Signed)
Check blood sugars on waking up  2x per week  Also check blood sugars about 2 hours after a meal and do this after different meals by rotation  Recommended blood sugar levels on waking up is 90-130 and about 2 hours after meal is 130-160  Please bring your blood sugar monitor to each visit, thank you  Cal  To confirm all Meds for high BP

## 2016-12-28 NOTE — Progress Notes (Signed)
Patient ID: Steve Bailey, male   DOB: [birth year], 83 y.o.   MRN: [id number]    Reason for Appointment:  follow-up    History of Present Illness   HYPERTENSION:   Has been present for several years.    On his visit in 07/2016 his blood pressure was significantly high at 0000000 systolic Was taking lisinopril 40 mg, amlodipine 5 mg and HCTZ was added  Blood pressure was improved with adding HCTZ Today her blood pressure was initially high but repeat testing was better He thinks his blood pressure was [id number] at home systolic with using an arm cuff  However the patient does not know what medications he is taking and apparently this is being addressed by his family  BP Readings from Last 3 Encounters:  12/28/16 (!) 142/62  12/25/16 (!) 145/85  09/11/16 126/64     Diagnosis: Type 2 DIABETES MELITUS, date of diagnosis: 1995          Oral hypoglycemic drugs: None      Insulin regimen: Humulin 70/30,  20 units  a.c. twice a day with pens       History:  His blood sugars have been usually well controlled over the last few years with A1c upper normal, now 6.3  Previously was taking Actos for insulin resistance  He refuses to take this because of perceived side effects  Current management, blood sugar patterns and problems:  He is checking his blood sugars very irregularly and now he says that he did not have to strips  Prescription was called in for test strips last week  He is somewhat inconsistent with taking insulin right before eating and sometimes may take it later  Not clear what his blood sugars at home  He has gained a significant monitor weight and he thinks this is from not being active and also with inconsistent diet since November   No reported hypoglycemia   Glucometer:  Freestyle  Blood sugar readings: None  Hypoglycemia frequency: None . He  will take a sweet drink when his blood sugar feels low   Carbohydrate intake: at meals: Mostly with vegetables,  also getting some lean meats like Chicken and pork but not consistently.   Egg, bacon, grits in am  Meals may be occasionally higher in fat.  Variable snacks at bedtime  Physical activity: Less recently  Weight history:  Wt Readings from Last 3 Encounters:  12/28/16 212 lb (96.2 kg)  12/25/16 209 lb (94.8 kg)  09/11/16 203 lb (92.1 kg)       Lab Results  Component Value Date   HGBA1C 6.3 12/28/2016   HGBA1C 6.0 08/10/2016   HGBA1C 6.3 04/03/2016   Lab Results  Component Value Date   MICROALBUR 3.5 (H) 08/11/2016   LDLCALC 69 04/03/2016   CREATININE 1.36 12/24/2016    Other active problems: See review of systems    Allergies as of 12/28/2016      Reactions   Cephalexin Nausea And Vomiting      Medication List       Accurate as of 12/28/16  3:54 PM. Always use your most recent med list.          acetaminophen 500 MG tablet Commonly known as:  TYLENOL Take 1,000 mg by mouth every 6 (six) hours as needed for moderate pain.   alfuzosin 10 MG 24 hr tablet Commonly known as:  UROXATRAL Take 10 mg by mouth at bedtime. Reported on 12/18/2015   ALKA-SELTZER PLUS  COLD & COUGH 03-24-09-325 MG Caps Generic drug:  Phenyleph-CPM-DM-APAP Take 2 capsules by mouth daily as needed (FOR COLD).   amLODipine 5 MG tablet Commonly known as:  NORVASC Take 1 tablet (5 mg total) by mouth daily.   apixaban 5 MG Tabs tablet Commonly known as:  ELIQUIS Take 1 tablet (5 mg total) by mouth 2 (two) times daily.   atorvastatin 40 MG tablet Commonly known as:  LIPITOR TAKE 1 TABLET EVERY DAY   Cinnamon 500 MG capsule Take 500 mg by mouth 2 (two) times daily.   finasteride 5 MG tablet Commonly known as:  PROSCAR Take 5 mg by mouth every morning. Reported on 12/18/2015   FREESTYLE LITE test strip Generic drug:  glucose blood USE AS DIRECTED TWICE A DAY DX CODE:11.65   HUMULIN 70/30 KWIKPEN (70-30) 100 UNIT/ML PEN Generic drug:  Insulin Isophane & Regular Human INJECT 18 UNITS  INTO THE SKIN EVERY MORNING & 22 UNITS AT SUPPER DAILY - NEEDS APPT   hydrochlorothiazide 12.5 MG capsule Commonly known as:  MICROZIDE TAKE ONE CAPSULE BY MOUTH EVERY DAY   HYDROcodone-acetaminophen 5-325 MG tablet Commonly known as:  NORCO/VICODIN Take 0.5-1 tablets every 6 hours as needed for severe pain   lisinopril 40 MG tablet Commonly known as:  PRINIVIL,ZESTRIL TAKE 1 TABLET EVERY DAY   metFORMIN 750 MG 24 hr tablet Commonly known as:  GLUCOPHAGE-XR TAKE 2 TABLETS (1,500 MG TOTAL) BY MOUTH DAILY WITH BREAKFAST.   nitroGLYCERIN 0.4 MG SL tablet Commonly known as:  NITROSTAT Place 1 tablet (0.4 mg total) under the tongue every 5 (five) minutes as needed.   traMADol 50 MG tablet Commonly known as:  ULTRAM Take 50 mg by mouth every 6 (six) hours as needed. Reported on 04/03/2016       Allergies:  Allergies  Allergen Reactions  . Cephalexin Nausea And Vomiting    Past Medical History:  Diagnosis Date  . AAA (abdominal aortic aneurysm) ([place])   . Atrial fibrillation ([place]) dx 10/10/12   started on med 10/10/12  . CAD (coronary artery disease)    remote CABG in 1997; prior PCI to the LAD i n1990; Nuclear study in February of 2010 with an EF of 60% with repeat cath showing distal LAD disease. Managed medically  . DM (diabetes mellitus) ([place])   . History of radiation therapy 11/24/12-01/18/13   Prostate 78Gy/20fx  . Hyperlipidemia   . Hypertension dx 10/10/12  . Prostate cancer ([place]) 09/02/12   Gleason 3+4=7, vol 103 cc  . Thyroid disease dx 10/10/12   started on Synthroid  . Tobacco abuse     Past Surgical History:  Procedure Laterality Date  . ANGIOPLASTY     of his left anterior descending previously in 1990.  Marland Kitchen CARDIOVASCULAR STRESS TEST  12-2008   showed EF 60%  . CARPAL TUNNEL RELEASE     right hand  . CATARACT EXTRACTION  2010   bilateral  . CORONARY ARTERY BYPASS GRAFT  11-1995   shows distal LAD disease, without recurrent symptoms of angina with  questionable mild apical ischemia but with normal EF   . ESOPHAGOGASTRODUODENOSCOPY (EGD) WITH ESOPHAGEAL DILATION  05/2012  . KNEE SURGERY     left, arthroscopic    Family History  Problem Relation Age of Onset  . Stroke Father   . Heart attack Mother   . Heart disease Mother   . Cancer Sister     lung  . Cancer Brother     prostate  .  Diabetes Brother   . Stroke Brother   . Stroke Brother   . Heart disease Brother   . Heart disease Brother   . Heart attack Sister   . Heart disease Sister   . Heart attack Sister   . Cancer Sister     breast  . Heart disease Sister   . Heart attack Sister   . Heart disease Sister   . Heart disease Sister     Social History:  reports that he quit smoking about 37 years ago. He quit smokeless tobacco use about 38 years ago. His smokeless tobacco use included Chew. He reports that he does not drink alcohol or use drugs.  ROS    Eye exam done annually at [facility] in April        HYPERLIPIDEMIA:    he has had high LDL and this is fairly well-controlled with 40 mg Lipitor, followed by cardiologist   Also has persistently low HDL   Lab Results  Component Value Date   CHOL 137 04/03/2016   HDL 51.80 04/03/2016   LDLCALC 69 04/03/2016   TRIG 79.0 04/03/2016   CHOLHDL 3 04/03/2016    Diabetic foot exam done in 9/17 showed normal monofilament sensation and decreased pulses  He also is being followed by vascular surgeon for aortic aneurysm    Examination:   BP (!) 142/62   Pulse 92   Ht 6' (1.829 m)   Wt 212 lb (96.2 kg)   SpO2 97%   BMI 28.75 kg/m   Body mass index is 28.75 kg/m.    Assesment/PLAN:  DIABETES:  See history of present illness for  discussion of current diabetes management, blood sugar patterns and problems identified He continues to be on premixed insulin over the last several years and metformin with good control A1c good at 6.3  He has not checked his blood sugars since he had not able to get his test  strips for various reasons and not clear what his home readings are He is also not very active and likely gaining weight from inconsistent diet  Recommended that he start checking his blood sugars at various times No change in insulin Start walking for exercise   HYPERTENSION:  Blood pressure appears to be relatively better today  He will continue amlodipine, HCTZ and lisinopril 40 mg but he will need to confirm what he is taking Also continue to monitor blood pressure at home periodically  He will follow-up for his diabetes in 4 months     Nazar Kuan 12/28/2016, 3:54 PM

## 2016-12-29 DIAGNOSIS — C61 Malignant neoplasm of prostate: Secondary | ICD-10-CM | POA: Diagnosis not present

## 2016-12-29 DIAGNOSIS — R3912 Poor urinary stream: Secondary | ICD-10-CM | POA: Diagnosis not present

## 2016-12-30 NOTE — Addendum Note (Signed)
Addended by: Lianne Cure A on: 12/30/2016 10:04 AM   Modules accepted: Orders

## 2017-01-01 ENCOUNTER — Emergency Department (HOSPITAL_COMMUNITY)
Admission: EM | Admit: 2017-01-01 | Discharge: 2017-01-01 | Disposition: A | Discharge status: Home / Self Care | Payer: Medicare Other | Attending: Emergency Medicine | Admitting: Emergency Medicine

## 2017-01-01 ENCOUNTER — Encounter (HOSPITAL_COMMUNITY): Payer: Self-pay | Admitting: Emergency Medicine

## 2017-01-01 ENCOUNTER — Emergency Department (HOSPITAL_COMMUNITY): Payer: Medicare Other

## 2017-01-01 DIAGNOSIS — R030 Elevated blood-pressure reading, without diagnosis of hypertension: Secondary | ICD-10-CM | POA: Diagnosis not present

## 2017-01-01 DIAGNOSIS — Z79899 Other long term (current) drug therapy: Secondary | ICD-10-CM | POA: Insufficient documentation

## 2017-01-01 DIAGNOSIS — S3991XA Unspecified injury of abdomen, initial encounter: Secondary | ICD-10-CM | POA: Diagnosis not present

## 2017-01-01 DIAGNOSIS — Z951 Presence of aortocoronary bypass graft: Secondary | ICD-10-CM | POA: Diagnosis not present

## 2017-01-01 DIAGNOSIS — E039 Hypothyroidism, unspecified: Secondary | ICD-10-CM | POA: Diagnosis not present

## 2017-01-01 DIAGNOSIS — Z87891 Personal history of nicotine dependence: Secondary | ICD-10-CM | POA: Insufficient documentation

## 2017-01-01 DIAGNOSIS — Y9389 Activity, other specified: Secondary | ICD-10-CM | POA: Diagnosis not present

## 2017-01-01 DIAGNOSIS — M545 Low back pain: Secondary | ICD-10-CM | POA: Diagnosis not present

## 2017-01-01 DIAGNOSIS — I25810 Atherosclerosis of coronary artery bypass graft(s) without angina pectoris: Secondary | ICD-10-CM | POA: Insufficient documentation

## 2017-01-01 DIAGNOSIS — E119 Type 2 diabetes mellitus without complications: Secondary | ICD-10-CM | POA: Diagnosis not present

## 2017-01-01 DIAGNOSIS — Y92410 Unspecified street and highway as the place of occurrence of the external cause: Secondary | ICD-10-CM | POA: Insufficient documentation

## 2017-01-01 DIAGNOSIS — Y999 Unspecified external cause status: Secondary | ICD-10-CM | POA: Insufficient documentation

## 2017-01-01 DIAGNOSIS — I10 Essential (primary) hypertension: Secondary | ICD-10-CM | POA: Diagnosis not present

## 2017-01-01 DIAGNOSIS — R109 Unspecified abdominal pain: Secondary | ICD-10-CM | POA: Diagnosis not present

## 2017-01-01 DIAGNOSIS — Z8546 Personal history of malignant neoplasm of prostate: Secondary | ICD-10-CM | POA: Diagnosis not present

## 2017-01-01 DIAGNOSIS — Z794 Long term (current) use of insulin: Secondary | ICD-10-CM | POA: Diagnosis not present

## 2017-01-01 DIAGNOSIS — S3992XA Unspecified injury of lower back, initial encounter: Secondary | ICD-10-CM | POA: Diagnosis present

## 2017-01-01 DIAGNOSIS — M5489 Other dorsalgia: Secondary | ICD-10-CM | POA: Diagnosis not present

## 2017-01-01 LAB — I-STAT CHEM 8, ED
BUN: 21 mg/dL — ABNORMAL HIGH (ref 6–20)
Calcium, Ion: 1.2 mmol/L (ref 1.15–1.40)
Chloride: 104 mmol/L (ref 101–111)
Creatinine, Ser: 1.4 mg/dL — ABNORMAL HIGH (ref 0.61–1.24)
Glucose, Bld: 140 mg/dL — ABNORMAL HIGH (ref 65–99)
HCT: 33 % — ABNORMAL LOW (ref 39.0–52.0)
Hemoglobin: 11.2 g/dL — ABNORMAL LOW (ref 13.0–17.0)
Potassium: 4 mmol/L (ref 3.5–5.1)
Sodium: 140 mmol/L (ref 135–145)
TCO2: 26 mmol/L (ref 0–100)

## 2017-01-01 MED ORDER — IOPAMIDOL (ISOVUE-300) INJECTION 61%
100.0000 mL | Freq: Once | INTRAVENOUS | Status: AC | PRN
  Administered 2017-01-01: 100 mL via INTRAVENOUS

## 2017-01-01 NOTE — Discharge Instructions (Signed)
Take Tylenol for pain. Follow-up with her doctor if any problems

## 2017-01-01 NOTE — ED Notes (Signed)
Pt ambulatory to treatment room without diff.
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
## 2017-01-04 ENCOUNTER — Other Ambulatory Visit: Payer: Self-pay

## 2017-01-04 MED ORDER — PEN NEEDLES 31G X 5 MM MISC: 1.0000 | Freq: Two times a day (BID) | 2 refills | Status: DC

## 2017-01-19 ENCOUNTER — Other Ambulatory Visit: Payer: Self-pay | Admitting: Endocrinology

## 2017-01-20 ENCOUNTER — Encounter: Payer: Self-pay | Admitting: Cardiovascular Disease

## 2017-01-20 ENCOUNTER — Ambulatory Visit (INDEPENDENT_AMBULATORY_CARE_PROVIDER_SITE_OTHER): Payer: Medicare Other | Admitting: Cardiovascular Disease

## 2017-01-20 VITALS — BP 144/76 | HR 71 | Ht 72.0 in | Wt 211.0 lb

## 2017-01-20 DIAGNOSIS — I714 Abdominal aortic aneurysm, without rupture, unspecified: Secondary | ICD-10-CM

## 2017-01-20 DIAGNOSIS — E7800 Pure hypercholesterolemia, unspecified: Secondary | ICD-10-CM

## 2017-01-20 DIAGNOSIS — I481 Persistent atrial fibrillation: Secondary | ICD-10-CM | POA: Diagnosis not present

## 2017-01-20 DIAGNOSIS — I25810 Atherosclerosis of coronary artery bypass graft(s) without angina pectoris: Secondary | ICD-10-CM

## 2017-01-20 DIAGNOSIS — I10 Essential (primary) hypertension: Secondary | ICD-10-CM

## 2017-01-20 DIAGNOSIS — I4819 Other persistent atrial fibrillation: Secondary | ICD-10-CM

## 2017-01-20 NOTE — Patient Instructions (Signed)

## 2017-01-20 NOTE — Progress Notes (Signed)
SUBJECTIVE: Steve Bailey presents for routine follow-up. He has a history of coronary artery disease and coronary artery bypass graft surgery, with his most recent cardiac catheterization performed in 2010. He also has hypertension, hyperlipidemia, IDDM, and atrial fibrillation. He is noted to be noncompliant with his medication regimen.   He denies chest pain.   ECG performed in the office today shows rate-controlled atrial fibrillation.  He checks his blood pressure at home and it runs 118/70s. He may get short of breath walking up a hill. He denies bleeding problems. He has not had to use nitroglycerin recently.   Review of Systems: As per "subjective", otherwise negative.  Allergies  Allergen Reactions  . Cephalexin Nausea And Vomiting    Current Outpatient Prescriptions  Medication Sig Dispense Refill  . acetaminophen (TYLENOL) 500 MG tablet Take 1,300 mg by mouth at bedtime.     Marland Kitchen alfuzosin (UROXATRAL) 10 MG 24 hr tablet Take 10 mg by mouth at bedtime. Reported on 12/18/2015    . amLODipine (NORVASC) 10 MG tablet TAKE 1 TABLET (10 MG TOTAL) BY MOUTH DAILY.  3  . apixaban (ELIQUIS) 5 MG TABS tablet Take 1 tablet (5 mg total) by mouth 2 (two) times daily. 60 tablet 6  . Cinnamon 500 MG capsule Take 500 mg by mouth 2 (two) times daily.    . finasteride (PROSCAR) 5 MG tablet Take 5 mg by mouth every morning. Reported on 12/18/2015    . FREESTYLE LITE test strip USE AS DIRECTED TWICE A DAY DX CODE:11.65 100 each 1  . HUMULIN 70/30 KWIKPEN (70-30) 100 UNIT/ML PEN INJECT 18 UNITS INTO THE SKIN EVERY MORNING & 22 UNITS AT SUPPER DAILY - NEEDS APPT 45 mL 2  . hydrochlorothiazide (MICROZIDE) 12.5 MG capsule TAKE ONE CAPSULE BY MOUTH EVERY DAY 30 capsule 2  . Insulin Pen Needle (PEN NEEDLES) 31G X 5 MM MISC 1 each by Does not apply route 2 (two) times daily. 90 each 2  . metFORMIN (GLUCOPHAGE-XR) 750 MG 24 hr tablet TAKE 2 TABLETS (1,500 MG TOTAL) BY MOUTH DAILY WITH BREAKFAST. 60  tablet 2  . nitroGLYCERIN (NITROSTAT) 0.4 MG SL tablet Place 1 tablet (0.4 mg total) under the tongue every 5 (five) minutes as needed. 25 tablet 3   No current facility-administered medications for this visit.     Past Medical History:  Diagnosis Date  . AAA (abdominal aortic aneurysm) ([place])   . Atrial fibrillation ([place]) dx 10/10/12   started on med 10/10/12  . CAD (coronary artery disease)    remote CABG in 1997; prior PCI to the LAD i n1990; Nuclear study in February of 2010 with an EF of 60% with repeat cath showing distal LAD disease. Managed medically  . DM (diabetes mellitus) ([place])   . History of radiation therapy 11/24/12-01/18/13   Prostate 78Gy/29fx  . Hyperlipidemia   . Hypertension dx 10/10/12  . Prostate cancer ([place]) 09/02/12   Gleason 3+4=7, vol 103 cc  . Thyroid disease dx 10/10/12   started on Synthroid  . Tobacco abuse     Past Surgical History:  Procedure Laterality Date  . ANGIOPLASTY     of his left anterior descending previously in 1990.  Marland Kitchen CARDIOVASCULAR STRESS TEST  12-2008   showed EF 60%  . CARPAL TUNNEL RELEASE     right hand  . CATARACT EXTRACTION  2010   bilateral  . CORONARY ARTERY BYPASS GRAFT  11-1995   shows distal LAD disease, without recurrent  symptoms of angina with questionable mild apical ischemia but with normal EF   . ESOPHAGOGASTRODUODENOSCOPY (EGD) WITH ESOPHAGEAL DILATION  05/2012  . KNEE SURGERY     left, arthroscopic    Social History   Social History  . Marital status: Married    Spouse name: N/A  . Number of children: N/A  . Years of education: N/A   Occupational History  . Not on file.   Social History Main Topics  . Smoking status: Former Smoker    Quit date: 10/14/1979  . Smokeless tobacco: Former Systems developer    Types: Chew    Quit date: 11/23/1978  . Alcohol use No  . Drug use: No  . Sexual activity: No   Other Topics Concern  . Not on file   Social History Narrative  . No narrative on file     Vitals:    01/20/17 0934  BP: (!) 144/76  Pulse: 71  SpO2: 99%  Weight: 211 lb (95.7 kg)  Height: 6' (1.829 m)    PHYSICAL EXAM General: NAD HEENT: Normal. Neck: No JVD, no thyromegaly. Lungs: Clear to auscultation bilaterally with normal respiratory effort. CV: Nondisplaced PMI.  Regular rate and irregular rhythm, normal S1/S2, no S3, no murmur. No pretibial or periankle edema.    Abdomen: soft, nontender. Neurologic: Alert and oriented.  Psych: Normal affect. Skin: Normal. Musculoskeletal: No gross deformities.    ECG: Most recent ECG reviewed.      ASSESSMENT AND PLAN:  1. CAD s/p CABG: Symptomatically stable. Continue Lipitor and metoprolol. As he is on Eliquis, I am avoiding ASA.  2. Essential HTN: Mildly elevated. Will monitor.  3. Hyperlipidemia: Lipids 04/03/16 total cholesterol 137, triglycerides 79, HDL 51, LDL 69. Continue Lipitor 40 mg.  4. Atrial fibrillation: HR controlled on Toprol-XL 100 mg daily. Anticoagulated with Eliquis. No changes.  5. AAA: 4.3 x 4.24 cm 12/25/16. Followed by vascular surgery.  Dispo: fu 6 months.  Kate Sable, M.D., F.A.C.C.

## 2017-03-02 DIAGNOSIS — H31012 Macula scars of posterior pole (postinflammatory) (post-traumatic), left eye: Secondary | ICD-10-CM | POA: Diagnosis not present

## 2017-03-02 DIAGNOSIS — E113513 Type 2 diabetes mellitus with proliferative diabetic retinopathy with macular edema, bilateral: Secondary | ICD-10-CM | POA: Diagnosis not present

## 2017-03-02 DIAGNOSIS — Z961 Presence of intraocular lens: Secondary | ICD-10-CM | POA: Diagnosis not present

## 2017-03-28 ENCOUNTER — Other Ambulatory Visit: Payer: Self-pay | Admitting: Endocrinology

## 2017-04-28 ENCOUNTER — Other Ambulatory Visit: Payer: Self-pay | Admitting: Endocrinology

## 2017-04-29 ENCOUNTER — Encounter: Payer: Self-pay | Admitting: Endocrinology

## 2017-04-29 ENCOUNTER — Ambulatory Visit (INDEPENDENT_AMBULATORY_CARE_PROVIDER_SITE_OTHER): Payer: Medicare Other | Admitting: Endocrinology

## 2017-04-29 VITALS — BP 130/68 | HR 77 | Ht 72.0 in | Wt 205.6 lb

## 2017-04-29 DIAGNOSIS — E538 Deficiency of other specified B group vitamins: Secondary | ICD-10-CM | POA: Diagnosis not present

## 2017-04-29 DIAGNOSIS — I25810 Atherosclerosis of coronary artery bypass graft(s) without angina pectoris: Secondary | ICD-10-CM

## 2017-04-29 DIAGNOSIS — E119 Type 2 diabetes mellitus without complications: Secondary | ICD-10-CM | POA: Diagnosis not present

## 2017-04-29 DIAGNOSIS — E1165 Type 2 diabetes mellitus with hyperglycemia: Secondary | ICD-10-CM | POA: Diagnosis not present

## 2017-04-29 DIAGNOSIS — Z794 Long term (current) use of insulin: Secondary | ICD-10-CM | POA: Diagnosis not present

## 2017-04-29 LAB — COMPREHENSIVE METABOLIC PANEL
ALT: 10 U/L (ref 0–53)
AST: 13 U/L (ref 0–37)
Albumin: 4.3 g/dL (ref 3.5–5.2)
Alkaline Phosphatase: 80 U/L (ref 39–117)
BUN: 25 mg/dL — ABNORMAL HIGH (ref 6–23)
CO2: 27 mEq/L (ref 19–32)
Calcium: 9.6 mg/dL (ref 8.4–10.5)
Chloride: 103 mEq/L (ref 96–112)
Creatinine, Ser: 1.26 mg/dL (ref 0.40–1.50)
GFR: 58.38 mL/min — ABNORMAL LOW (ref >60.00)
Glucose, Bld: 130 mg/dL — ABNORMAL HIGH (ref 70–99)
Potassium: 4 mEq/L (ref 3.5–5.1)
Sodium: 138 mEq/L (ref 135–145)
Total Bilirubin: 0.6 mg/dL (ref 0.2–1.2)
Total Protein: 7.2 g/dL (ref 6.0–8.3)

## 2017-04-29 LAB — CBC
HCT: 37.3 % — ABNORMAL LOW (ref 39.0–52.0)
Hemoglobin: 12.8 g/dL — ABNORMAL LOW (ref 13.0–17.0)
MCHC: 34.2 g/dL (ref 30.0–36.0)
MCV: 95.2 fl (ref 78.0–100.0)
Platelets: 197 10*3/uL (ref 150.0–400.0)
RBC: 3.92 Mil/uL — ABNORMAL LOW (ref 4.22–5.81)
RDW: 14 % (ref 11.5–15.5)
WBC: 5.2 10*3/uL (ref 4.0–10.5)

## 2017-04-29 LAB — LIPID PANEL
Cholesterol: 222 mg/dL — ABNORMAL HIGH (ref 0–200)
HDL: 46.7 mg/dL (ref >39.00)
LDL Cholesterol: 145 mg/dL — ABNORMAL HIGH (ref 0–99)
NonHDL: 174.88
Total CHOL/HDL Ratio: 5
Triglycerides: 147 mg/dL (ref 0.0–149.0)
VLDL: 29.4 mg/dL (ref 0.0–40.0)

## 2017-04-29 LAB — POCT GLYCOSYLATED HEMOGLOBIN (HGB A1C): Hemoglobin A1C: 6.4

## 2017-04-29 LAB — GLUCOSE, POCT (MANUAL RESULT ENTRY): POC Glucose: 138 mg/dl — AB (ref 70–99)

## 2017-04-29 LAB — VITAMIN B12: Vitamin B-12: 648 pg/mL (ref 211–911)

## 2017-04-29 NOTE — Progress Notes (Signed)
Patient ID: Steve Bailey, male   DOB: 1934-08-09, 83 y.o.   MRN: [id number]    Reason for Appointment:  follow-up    History of Present Illness    Diagnosis: Type 2 DIABETES MELITUS, date of diagnosis: 1995                Insulin regimen: Humulin 70/30,  20 units  a.c. twice a day with pens     Oral hypoglycemic drugs: Metformin ER 1500 mg daily   History:  His blood sugars have been usually well controlled over the last few years with A1c upper normal, now 6.4, previously 6.3  Current management, blood sugar patterns and problems:  Despite reminders he does not take his insulin before eating and will take it randomly after breakfast and sometime around suppertime  He is still not checking his blood sugars and may have checked only an occasional reading  His control is surprisingly still fairly good without hypoglycemic symptoms  He has lost back some of the weight that he had gained and this is from being more active with the improved weather  He thinks he is eating more vegetables recently and overall avoiding a lot of carbohydrates Previously was taking Actos for insulin resistance, refuses to take this because of fear of side effects   Glucometer:  Freestyle  Blood sugar readings: Minimal, does not remember his readings  Hypoglycemia frequency: None recently. He  will take a sweet drink when his blood sugar feels low   Carbohydrate intake: at meals: Mostly with vegetables, also getting some lean meats like Chicken and pork but not consistently.   Egg, bacon, grits in am   Physical activity: Fairly active farming  Weight history:  Wt Readings from Last 3 Encounters:  04/29/17 205 lb 9.6 oz (93.3 kg)  01/20/17 211 lb (95.7 kg)  01/01/17 212 lb (96.2 kg)      Lab Results  Component Value Date   HGBA1C 6.4 04/29/2017   HGBA1C 6.3 12/28/2016   HGBA1C 6.0 08/10/2016   Lab Results  Component Value Date   MICROALBUR 3.5 (H) 08/11/2016   LDLCALC 145  (H) 04/29/2017   CREATININE 1.26 04/29/2017    Other active problems: See review of systems    Allergies as of 04/29/2017      Reactions   Cephalexin Nausea And Vomiting      Medication List       Accurate as of 04/29/17 11:59 PM. Always use your most recent med list.          acetaminophen 500 MG tablet Commonly known as:  TYLENOL Take 1,300 mg by mouth at bedtime.   alfuzosin 10 MG 24 hr tablet Commonly known as:  UROXATRAL Take 10 mg by mouth at bedtime. Reported on 12/18/2015   amLODipine 10 MG tablet Commonly known as:  NORVASC TAKE 1 TABLET (10 MG TOTAL) BY MOUTH DAILY.   apixaban 5 MG Tabs tablet Commonly known as:  ELIQUIS Take 1 tablet (5 mg total) by mouth 2 (two) times daily.   Cinnamon 500 MG capsule Take 500 mg by mouth 2 (two) times daily.   finasteride 5 MG tablet Commonly known as:  PROSCAR Take 5 mg by mouth every morning. Reported on 12/18/2015   FREESTYLE LITE test strip Generic drug:  glucose blood USE AS DIRECTED TWICE A DAY DX CODE:11.65   HUMULIN 70/30 KWIKPEN (70-30) 100 UNIT/ML PEN Generic drug:  Insulin Isophane & Regular Human INJECT 18 UNITS INTO THE  SKIN EVERY MORNING & 22 UNITS AT SUPPER DAILY - NEEDS APPT   hydrochlorothiazide 12.5 MG capsule Commonly known as:  MICROZIDE TAKE ONE CAPSULE BY MOUTH EVERY DAY   metFORMIN 750 MG 24 hr tablet Commonly known as:  GLUCOPHAGE-XR TAKE 2 TABLETS (1,500 MG TOTAL) BY MOUTH DAILY WITH BREAKFAST.   nitroGLYCERIN 0.4 MG SL tablet Commonly known as:  NITROSTAT Place 1 tablet (0.4 mg total) under the tongue every 5 (five) minutes as needed.   Pen Needles 31G X 5 MM Misc 1 each by Does not apply route 2 (two) times daily.       Allergies:  Allergies  Allergen Reactions  . Cephalexin Nausea And Vomiting    Past Medical History:  Diagnosis Date  . AAA (abdominal aortic aneurysm) ([place])   . Atrial fibrillation ([place]) dx 10/10/12   started on med 10/10/12  . CAD (coronary artery  disease)    remote CABG in 1997; prior PCI to the LAD i n1990; Nuclear study in February of 2010 with an EF of 60% with repeat cath showing distal LAD disease. Managed medically  . DM (diabetes mellitus) ([place])   . History of radiation therapy 11/24/12-01/18/13   Prostate 78Gy/14fx  . Hyperlipidemia   . Hypertension dx 10/10/12  . Prostate cancer ([place]) 09/02/12   Gleason 3+4=7, vol 103 cc  . Thyroid disease dx 10/10/12   started on Synthroid  . Tobacco abuse     Past Surgical History:  Procedure Laterality Date  . ANGIOPLASTY     of his left anterior descending previously in 1990.  Marland Kitchen CARDIOVASCULAR STRESS TEST  12-2008   showed EF 60%  . CARPAL TUNNEL RELEASE     right hand  . CATARACT EXTRACTION  2010   bilateral  . CORONARY ARTERY BYPASS GRAFT  11-1995   shows distal LAD disease, without recurrent symptoms of angina with questionable mild apical ischemia but with normal EF   . ESOPHAGOGASTRODUODENOSCOPY (EGD) WITH ESOPHAGEAL DILATION  05/2012  . KNEE SURGERY     left, arthroscopic    Family History  Problem Relation Age of Onset  . Stroke Father   . Heart attack Mother   . Heart disease Mother   . Cancer Sister        lung  . Cancer Brother        prostate  . Diabetes Brother   . Stroke Brother   . Stroke Brother   . Heart disease Brother   . Heart disease Brother   . Heart attack Sister   . Heart disease Sister   . Heart attack Sister   . Cancer Sister        breast  . Heart disease Sister   . Heart attack Sister   . Heart disease Sister   . Heart disease Sister     Social History:  reports that he quit smoking about 37 years ago. He quit smokeless tobacco use about 38 years ago. His smokeless tobacco use included Chew. He reports that he does not drink alcohol or use drugs.  ROS   HYPERTENSION:   Has been present for several years.    His taking lisinopril 40 mg, amlodipine 5 mg and HCTZ   Blood pressure was improved with adding HCTZ   He thinks his  blood pressure at home today was 371 Bystolic and he thinks he checks this everyday  BP Readings from Last 3 Encounters:  04/29/17 130/68  01/20/17 (!) 144/76  01/01/17 166/79  [facility] exam done [facility] in [facility], has retinopathy and macular edema        HYPERLIPIDEMIA:    he has had high LDL and this has been fairly well-controlled with 40 mg Lipitor, followed by cardiologist   Also has persistently low HDL   Lab Results  Component Value Date   CHOL 222 (H) 04/29/2017   HDL 46.70 04/29/2017   LDLCALC 145 (H) 04/29/2017   TRIG 147.0 04/29/2017   CHOLHDL 5 04/29/2017    Diabetic foot exam done in 9/17 showed normal monofilament sensation and decreased pulses  He also is being followed by vascular surgeon for aortic aneurysm    Examination:   BP 130/68 (Cuff Size: Normal)   Pulse 77   Ht 6' (1.829 m)   Wt 205 lb 9.6 oz (93.3 kg)   SpO2 99%   BMI 27.88 kg/m   Body mass index is 27.88 kg/m.    Assesment/PLAN:  DIABETES, Type II on insulin:  See history of present illness for  discussion of current diabetes management and problems identified He continues to be well controlled on premixed insulin over the last several years and metformin with good control  A1c good at 6.4  He has not checked his blood sugars since he had not able to get his test strips for various reasons and not clear what his home readings are Recently has lost weight and overall appears to have good control  Recommended that he start checking his blood sugars at various times No change in insulin However he needs to start taking his insulin BEFORE breakfast and supper and this was emphasized    HYPERTENSION:  Blood pressure is well-controlled   He will continue amlodipine, HCTZ and lisinopril 40 mg Also continue to monitor blood pressure at home periodically  LIPIDS: Needs follow-up today  He will follow-up for his diabetes in 4 months     Steve Bailey 04/30/2017, 8:53 AM

## 2017-04-29 NOTE — Patient Instructions (Signed)
MUST TAKE INSULIN JUST BEFORE MEALS

## 2017-04-30 ENCOUNTER — Telehealth: Payer: Self-pay

## 2017-04-30 MED ORDER — ATORVASTATIN CALCIUM 40 MG PO TABS: 40.0000 mg | ORAL_TABLET | Freq: Every day | ORAL | 3 refills | Status: DC

## 2017-04-30 NOTE — Progress Notes (Signed)
Please call to let patient know that the cholesterol has gone up and he needs to get his cardiologist to prescribe his cholesterol medicine atorvastatin again

## 2017-04-30 NOTE — Telephone Encounter (Signed)
-----   Message from Herminio Commons, MD sent at 04/30/2017  9:13 AM EDT ----- Please restart Lipitor at prior dose. Thx.  ----- Message ----- From: Elayne Snare, MD Sent: 04/30/2017   9:12 AM To: Herminio Commons, MD  Jamesetta So  The prescription for atorvastatin appears to have been discontinued, this must be an error  since your last note says that he is still taking it His LDL is gone up I would request that you restart it, thanks  Ajay

## 2017-04-30 NOTE — Telephone Encounter (Signed)
I spoke with daughter Inez Catalina, pt will restart lipitor 40 mg daily at dinner

## 2017-05-19 ENCOUNTER — Other Ambulatory Visit: Payer: Self-pay | Admitting: Cardiovascular Disease

## 2017-06-24 ENCOUNTER — Other Ambulatory Visit: Payer: Self-pay | Admitting: Endocrinology

## 2017-06-29 DIAGNOSIS — C61 Malignant neoplasm of prostate: Secondary | ICD-10-CM | POA: Diagnosis not present

## 2017-07-06 DIAGNOSIS — N401 Enlarged prostate with lower urinary tract symptoms: Secondary | ICD-10-CM | POA: Diagnosis not present

## 2017-07-06 DIAGNOSIS — R3912 Poor urinary stream: Secondary | ICD-10-CM | POA: Diagnosis not present

## 2017-07-06 DIAGNOSIS — C61 Malignant neoplasm of prostate: Secondary | ICD-10-CM | POA: Diagnosis not present

## 2017-07-21 DIAGNOSIS — S0501XA Injury of conjunctiva and corneal abrasion without foreign body, right eye, initial encounter: Secondary | ICD-10-CM | POA: Diagnosis not present

## 2017-07-22 DIAGNOSIS — H348322 Tributary (branch) retinal vein occlusion, left eye, stable: Secondary | ICD-10-CM | POA: Diagnosis not present

## 2017-07-22 DIAGNOSIS — H1820 Unspecified corneal edema: Secondary | ICD-10-CM | POA: Diagnosis not present

## 2017-07-22 DIAGNOSIS — E113211 Type 2 diabetes mellitus with mild nonproliferative diabetic retinopathy with macular edema, right eye: Secondary | ICD-10-CM | POA: Diagnosis not present

## 2017-07-28 ENCOUNTER — Encounter: Payer: Self-pay | Admitting: Cardiovascular Disease

## 2017-07-28 ENCOUNTER — Ambulatory Visit (INDEPENDENT_AMBULATORY_CARE_PROVIDER_SITE_OTHER): Payer: Medicare Other | Admitting: Cardiovascular Disease

## 2017-07-28 VITALS — BP 136/70 | HR 78 | Ht 72.0 in | Wt 207.0 lb

## 2017-07-28 DIAGNOSIS — I25708 Atherosclerosis of coronary artery bypass graft(s), unspecified, with other forms of angina pectoris: Secondary | ICD-10-CM | POA: Diagnosis not present

## 2017-07-28 DIAGNOSIS — I714 Abdominal aortic aneurysm, without rupture, unspecified: Secondary | ICD-10-CM

## 2017-07-28 DIAGNOSIS — I482 Chronic atrial fibrillation, unspecified: Secondary | ICD-10-CM

## 2017-07-28 DIAGNOSIS — E785 Hyperlipidemia, unspecified: Secondary | ICD-10-CM

## 2017-07-28 DIAGNOSIS — I209 Angina pectoris, unspecified: Secondary | ICD-10-CM | POA: Diagnosis not present

## 2017-07-28 DIAGNOSIS — I25810 Atherosclerosis of coronary artery bypass graft(s) without angina pectoris: Secondary | ICD-10-CM | POA: Diagnosis not present

## 2017-07-28 DIAGNOSIS — I10 Essential (primary) hypertension: Secondary | ICD-10-CM

## 2017-07-28 NOTE — Patient Instructions (Signed)

## 2017-07-28 NOTE — Progress Notes (Signed)
SUBJECTIVE: Mr. Kendzierski presents for routine follow-up. He has a history of coronary artery disease and coronary artery bypass graft surgery, with his most recent cardiac catheterization performed in 2010. He also has hypertension, hyperlipidemia, IDDM, and atrial fibrillation. He is noted to be noncompliant with his medication regimen.      Review of Systems: As per "subjective", otherwise negative.  Allergies  Allergen Reactions  . Cephalexin Nausea And Vomiting    Current Outpatient Prescriptions  Medication Sig Dispense Refill  . acetaminophen (TYLENOL) 500 MG tablet Take 1,300 mg by mouth at bedtime.     Marland Kitchen alfuzosin (UROXATRAL) 10 MG 24 hr tablet Take 10 mg by mouth at bedtime. Reported on 12/18/2015    . amLODipine (NORVASC) 10 MG tablet TAKE 1 TABLET (10 MG TOTAL) BY MOUTH DAILY.  3  . atorvastatin (LIPITOR) 40 MG tablet Take 1 tablet (40 mg total) by mouth daily. 90 tablet 3  . Cinnamon 500 MG capsule Take 500 mg by mouth 2 (two) times daily.    Marland Kitchen ELIQUIS 5 MG TABS tablet TAKE 1 TABLET BY MOUTH TWICE A DAY 60 tablet 6  . finasteride (PROSCAR) 5 MG tablet Take 5 mg by mouth every morning. Reported on 12/18/2015    . FREESTYLE LITE test strip USE AS DIRECTED TWICE A DAY DX CODE:11.65 100 each 1  . HUMULIN 70/30 KWIKPEN (70-30) 100 UNIT/ML PEN INJECT 18 UNITS INTO THE SKIN EVERY MORNING & 22 UNITS AT SUPPER DAILY - NEEDS APPT 45 mL 2  . hydrochlorothiazide (MICROZIDE) 12.5 MG capsule TAKE ONE CAPSULE BY MOUTH EVERY DAY 30 capsule 2  . Insulin Pen Needle (PEN NEEDLES) 31G X 5 MM MISC 1 each by Does not apply route 2 (two) times daily. 90 each 2  . metFORMIN (GLUCOPHAGE-XR) 750 MG 24 hr tablet TAKE 2 TABLETS (1,500 MG TOTAL) BY MOUTH DAILY WITH BREAKFAST. 60 tablet 2  . nitroGLYCERIN (NITROSTAT) 0.4 MG SL tablet Place 1 tablet (0.4 mg total) under the tongue every 5 (five) minutes as needed. 25 tablet 3   No current facility-administered medications for this visit.     Past  Medical History:  Diagnosis Date  . AAA (abdominal aortic aneurysm) ([place])   . Atrial fibrillation ([place]) dx 10/10/12   started on med 10/10/12  . CAD (coronary artery disease)    remote CABG in 1997; prior PCI to the LAD i n1990; Nuclear study in February of 2010 with an EF of 60% with repeat cath showing distal LAD disease. Managed medically  . DM (diabetes mellitus) ([place])   . History of radiation therapy 11/24/12-01/18/13   Prostate 78Gy/68fx  . Hyperlipidemia   . Hypertension dx 10/10/12  . Prostate cancer ([place]) 09/02/12   Gleason 3+4=7, vol 103 cc  . Thyroid disease dx 10/10/12   started on Synthroid  . Tobacco abuse     Past Surgical History:  Procedure Laterality Date  . ANGIOPLASTY     of his left anterior descending previously in 1990.  Marland Kitchen CARDIOVASCULAR STRESS TEST  12-2008   showed EF 60%  . CARPAL TUNNEL RELEASE     right hand  . CATARACT EXTRACTION  2010   bilateral  . CORONARY ARTERY BYPASS GRAFT  11-1995   shows distal LAD disease, without recurrent symptoms of angina with questionable mild apical ischemia but with normal EF   . ESOPHAGOGASTRODUODENOSCOPY (EGD) WITH ESOPHAGEAL DILATION  05/2012  . KNEE SURGERY     left, arthroscopic  Social History   Social History  . Marital status: Married    Spouse name: N/A  . Number of children: N/A  . Years of education: N/A   Occupational History  . Not on file.   Social History Main Topics  . Smoking status: Former Smoker    Quit date: 10/14/1979  . Smokeless tobacco: Former Systems developer    Types: Chew    Quit date: 11/23/1978  . Alcohol use No  . Drug use: No  . Sexual activity: No   Other Topics Concern  . Not on file   Social History Narrative  . No narrative on file     Vitals:   07/28/17 1319  BP: 136/70  Pulse: 78  SpO2: 98%  Weight: 207 lb (93.9 kg)  Height: 6' (1.829 m)    Wt Readings from Last 3 Encounters:  07/28/17 207 lb (93.9 kg)  04/29/17 205 lb 9.6 oz (93.3 kg)  01/20/17 211 lb (95.7  kg)     PHYSICAL EXAM General: NAD HEENT: Normal. Neck: No JVD, no thyromegaly. Lungs: Clear to auscultation bilaterally with normal respiratory effort. CV: Regular rate and irregular rhythm, normal S1/S2, no S3, no murmur. No pretibial or periankle edema.  No carotid bruit.   Abdomen: Soft, nontender, no distention.  Neurologic: Alert and oriented.  Psych: Normal affect. Skin: Normal. Musculoskeletal: No gross deformities.    ECG: Most recent ECG reviewed.   Labs: Lab Results  Component Value Date/Time   K 4.0 04/29/2017 02:20 PM   BUN 25 (H) 04/29/2017 02:20 PM   BUN 23 09/11/2016 04:45 PM   CREATININE 1.26 04/29/2017 02:20 PM   ALT 10 04/29/2017 02:20 PM   TSH 2.62 04/03/2016 08:48 AM   HGB 12.8 (L) 04/29/2017 02:20 PM     Lipids: Lab Results  Component Value Date/Time   LDLCALC 145 (H) 04/29/2017 02:20 PM   CHOL 222 (H) 04/29/2017 02:20 PM   TRIG 147.0 04/29/2017 02:20 PM   HDL 46.70 04/29/2017 02:20 PM       ASSESSMENT AND PLAN:  1. CAD s/p CABG: Symptomatically stable. Continue Lipitor and metoprolol. As he is on Eliquis, I am avoiding ASA.  2. Essential HTN: Controlled. No changes.  3. Hyperlipidemia: Lipids 04/29/17 total cholesterol 222, triglycerides 147, HDL 46, LDL 145.Given the dramatic increase since 2017, I would suspect there is medication noncompliance. He admits to being out of it for 3 or 4 months. It was recently restarted. Continue Lipitor 40 mg.Lipids will need to be repeated within the next several months.  4. Atrial fibrillation: HR controlled on Toprol-XL 100 mg daily. Anticoagulated with Eliquis. No changes.  5. AAA: 4.3 x 4.24 cm 12/25/16. Followed by vascular surgery.     Disposition: Follow up 6 months.   Kate Sable, M.D., F.A.C.C.

## 2017-08-03 ENCOUNTER — Other Ambulatory Visit: Payer: Self-pay | Admitting: Endocrinology

## 2017-08-06 DIAGNOSIS — E113211 Type 2 diabetes mellitus with mild nonproliferative diabetic retinopathy with macular edema, right eye: Secondary | ICD-10-CM | POA: Diagnosis not present

## 2017-08-06 DIAGNOSIS — H1820 Unspecified corneal edema: Secondary | ICD-10-CM | POA: Diagnosis not present

## 2017-08-17 DIAGNOSIS — Z23 Encounter for immunization: Secondary | ICD-10-CM | POA: Diagnosis not present

## 2017-08-30 ENCOUNTER — Ambulatory Visit (INDEPENDENT_AMBULATORY_CARE_PROVIDER_SITE_OTHER): Payer: Medicare Other | Admitting: Endocrinology

## 2017-08-30 ENCOUNTER — Encounter: Payer: Self-pay | Admitting: Endocrinology

## 2017-08-30 VITALS — BP 132/84 | HR 87 | Ht 72.0 in | Wt 205.8 lb

## 2017-08-30 DIAGNOSIS — Z794 Long term (current) use of insulin: Secondary | ICD-10-CM

## 2017-08-30 DIAGNOSIS — I25810 Atherosclerosis of coronary artery bypass graft(s) without angina pectoris: Secondary | ICD-10-CM

## 2017-08-30 DIAGNOSIS — E1165 Type 2 diabetes mellitus with hyperglycemia: Secondary | ICD-10-CM | POA: Diagnosis not present

## 2017-08-30 LAB — POCT GLYCOSYLATED HEMOGLOBIN (HGB A1C): Hemoglobin A1C: 6.2

## 2017-08-30 NOTE — Patient Instructions (Signed)
EVENING INSULIN 18 UNITS   MUST CHECK SUGARS AT NITE ALSO before bedime

## 2017-08-30 NOTE — Progress Notes (Signed)
Patient ID: Steve Bailey, male   DOB: 1934/08/20, 83 y.o.   MRN: [id number]    Reason for Appointment:  follow-up of various problems  History of Present Illness    Diagnosis: Type 2 DIABETES MELITUS, date of diagnosis: 1995                Insulin regimen: Humulin 70/30,  20 units  a.c. twice a day with pens      Oral hypoglycemic drugs: Metformin ER 1500 mg daily   History:   His blood sugars have been usually well controlled over the last few years with A1c upper normal, now 6.2 and usually under 6.5  Current management, blood sugar patterns and problems:  Despite reminders he does not bring his glucose monitor for download  Most likely is checking only occasional readings in the morning and possibly at suppertime  He thinks his blood sugars are relatively lower fasting  Also has had one episode of waking up at about 2 AM with sweating treated with juice and relief of symptoms  He thinks he is trying to take his insulin or consistently before eating as directed now  He is not doing any formal exercise but 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  Freestyle  Blood sugar readings by recall: Minimal, am 80+. Acs 120  Hypoglycemia frequency: Rare   Carbohydrate intake: at meals: Mostly with vegetables, also getting some lean meats like Chicken and pork but not consistently.   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 Always use your most recent med list.          acetaminophen 500 MG tablet Commonly known as:  TYLENOL Take 1,300 mg by mouth at bedtime.   alfuzosin 10 MG 24 hr tablet Commonly known as:  UROXATRAL Take 10 mg by mouth at bedtime. Reported on 12/18/2015   amLODipine 10 MG tablet Commonly known as:  NORVASC TAKE 1 TABLET (10 MG TOTAL) BY MOUTH DAILY.   atorvastatin 40 MG tablet Commonly known as:  LIPITOR Take 1 tablet (40 mg total) by mouth daily.   Cinnamon 500 MG capsule Take 500 mg by mouth 2 (two) times daily.   ELIQUIS 5 MG Tabs tablet Generic drug:  apixaban TAKE 1 TABLET BY MOUTH TWICE A DAY   finasteride 5 MG tablet Commonly known as:  PROSCAR Take 5 mg by mouth every morning. Reported on 12/18/2015   FREESTYLE LITE test strip Generic drug:  glucose blood USE AS DIRECTED TWICE A DAY DX CODE:11.65   HUMULIN 70/30 KWIKPEN (70-30) 100 UNIT/ML PEN Generic drug:  Insulin Isophane & Regular Human INJECT 18 UNITS INTO THE SKIN EVERY MORNING & 22 UNITS AT SUPPER DAILY - NEEDS APPT   hydrochlorothiazide 12.5 MG capsule Commonly known as:  MICROZIDE TAKE ONE CAPSULE BY MOUTH EVERY DAY   metFORMIN 750 MG 24 hr tablet Commonly known as:  GLUCOPHAGE-XR TAKE 2 TABLETS (1,500 MG TOTAL) BY MOUTH DAILY WITH BREAKFAST.   nitroGLYCERIN 0.4 MG SL tablet Commonly known as:  NITROSTAT Place 1 tablet (0.4 mg total) under the tongue every 5 (five) minutes as needed.   Pen Needles 31G X 5 MM Misc 1 each by Does not apply route 2 (two) times daily.       Allergies:  Allergies  Allergen Reactions  . Cephalexin Nausea And Vomiting    Past Medical History:   Diagnosis Date  . AAA (abdominal aortic aneurysm) ([place])   . Atrial fibrillation ([place]) dx 10/10/12   started on med 10/10/12  . CAD (coronary artery disease)    remote CABG in 1997; prior PCI to the LAD i n1990; Nuclear study in February of 2010 with an EF of 60% with repeat cath showing distal LAD disease. Managed medically  . DM (diabetes mellitus) ([place])   . History of radiation therapy 11/24/12-01/18/13   Prostate 78Gy/39fx  . Hyperlipidemia   . Hypertension dx 10/10/12  . 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 Thyroid disease dx 10/10/12   started on Synthroid  . Tobacco abuse     Past Surgical History:  Procedure Laterality Date  . ANGIOPLASTY     of his left anterior descending previously in 1990.  Marland Kitchen CARDIOVASCULAR STRESS TEST  12-2008   showed EF 60%  . CARPAL TUNNEL RELEASE     right hand  . CATARACT EXTRACTION  2010   bilateral  . CORONARY ARTERY BYPASS GRAFT  11-1995   shows distal LAD disease, without recurrent symptoms of angina with questionable mild apical ischemia but with normal EF   . ESOPHAGOGASTRODUODENOSCOPY (EGD) WITH ESOPHAGEAL DILATION  05/2012  . KNEE SURGERY     left, arthroscopic    Family History  Problem Relation Age of Onset  . Stroke Father   . Heart attack Mother   . Heart disease Mother   . Cancer Sister        lung  . Cancer Brother        prostate  . Diabetes Brother   . Stroke Brother   . Stroke Brother   . Heart disease Brother   . Heart disease Brother   . Heart attack Sister   . Heart disease Sister   . Heart attack Sister   . Cancer Sister        breast  . Heart disease Sister   . Heart attack Sister   . Heart disease Sister   . Heart disease Sister     Social History:  reports that he quit smoking about 37 years ago. He quit smokeless tobacco use about 38 years ago. His smokeless tobacco use included Chew. He reports that he does not drink alcohol or use drugs.  ROS   HYPERTENSION:   Has been  present for several years.    His taking lisinopril 40 mg, amlodipine 5 mg and HCTZ   Blood pressure was improved with adding HCTZ   He thinks his blood pressure  at home today was 325 Bystolic and he thinks he checks this everyday  BP Readings from Last 3 Encounters:  08/30/17 132/84  07/28/17 136/70  04/29/17 130/68    Eye exam done annually at [facility] in April, has retinopathy and macular edema        HYPERLIPIDEMIA:    he has had high LDL and this has been fairly well-controlled with 40 mg Lipitor Probably was not taking this on his last visit in June   Also has persistently low HDL   Lab Results  Component Value Date   CHOL 222 (H) 04/29/2017   HDL 46.70 04/29/2017   LDLCALC 145 (H) 04/29/2017   TRIG 147.0 04/29/2017   CHOLHDL 5 04/29/2017    Diabetic foot exam done in 10/18 showed normal monofilament sensation and absent pulses  He also is being followed by vascular surgeon for aortic aneurysm    Examination:   BP 132/84   Pulse 87   Ht 6' (1.829 m)   Wt 205 lb 12.8 oz (93.4 kg)   SpO2 98%   BMI 27.91 kg/m   Body mass index is 27.91 kg/m.   Diabetic Foot Exam - Simple   Simple Foot Form Diabetic Foot exam was performed with the following findings:  Yes 08/30/2017  1:48 PM  Visual Inspection No deformities, no ulcerations, no other skin breakdown bilaterally:  Yes Sensation Testing Intact to touch and monofilament testing bilaterally:  Yes Pulse Check See comments:  Yes Comments Absent pulses     Assesment/PLAN:  DIABETES, Type II on insulin:  See history of present illness for  discussion of current diabetes management and problems identified  He continues to be well controlled on Twice a day premixed insulin  and metformin over the last several years with good control  A1c is now 6.2 and has been consistently under 6.5 without hypoglycemia  He has not checked his blood sugars regularly and does not have his monitor for review again He  does however report 1 episode of hypoglycemia during the night but does not remember why  Overall blood sugars appear to be lower at fasting than in the evenings from his history For this reason he will REDUCE his evening doses to 18 units  He does need to check readings after meals especially at night including at bedtime to help adjust his insulin Again reminded him to take his insulin before eating at least 15-30 minutes before  Foot exam: Asymptomatic peripheral vascular disease present but no neuropathy   HYPERTENSION:  Blood pressure is well-controlled He will continue amlodipine, HCTZ and lisinopril 40 mg  LIPIDS: Needs follow-up today      Amerika Nourse 08/31/2017, 11:05 AM

## 2017-09-09 ENCOUNTER — Other Ambulatory Visit: Payer: Self-pay | Admitting: Endocrinology

## 2017-09-29 ENCOUNTER — Telehealth: Payer: Self-pay | Admitting: Cardiovascular Disease

## 2017-09-29 DIAGNOSIS — H179 Unspecified corneal scar and opacity: Secondary | ICD-10-CM | POA: Diagnosis not present

## 2017-09-29 DIAGNOSIS — Z961 Presence of intraocular lens: Secondary | ICD-10-CM | POA: Diagnosis not present

## 2017-09-29 DIAGNOSIS — H18231 Secondary corneal edema, right eye: Secondary | ICD-10-CM | POA: Diagnosis not present

## 2017-09-29 NOTE — Telephone Encounter (Signed)
Pls give pt's daughter Inez Catalina a call-- states the pt has a knot that's painful come up on his leg. He does not have a PCP

## 2017-09-29 NOTE — Telephone Encounter (Signed)
Per daughter, patient c/o a sore, raised area that felt like a marble, the size of a dime that patient noticed on his up on right lower leg today while washing up. Patient denies redness, swelling or fever. Patient is walking okay. No c/o dizziness, chest pain or sob. Daughter advised that patient needed an evaluation and he should contact or go to [employer] or get established with a PCP. Daughter verbalized understanding of plan.

## 2017-10-27 DIAGNOSIS — H179 Unspecified corneal scar and opacity: Secondary | ICD-10-CM | POA: Diagnosis not present

## 2017-10-27 DIAGNOSIS — Z961 Presence of intraocular lens: Secondary | ICD-10-CM | POA: Diagnosis not present

## 2017-10-27 DIAGNOSIS — H18231 Secondary corneal edema, right eye: Secondary | ICD-10-CM | POA: Diagnosis not present

## 2017-10-27 DIAGNOSIS — H1851 Endothelial corneal dystrophy: Secondary | ICD-10-CM | POA: Diagnosis not present

## 2017-11-11 ENCOUNTER — Other Ambulatory Visit: Payer: Self-pay | Admitting: Endocrinology

## 2017-12-09 ENCOUNTER — Other Ambulatory Visit: Payer: Self-pay | Admitting: Cardiovascular Disease

## 2017-12-16 DIAGNOSIS — E785 Hyperlipidemia, unspecified: Secondary | ICD-10-CM | POA: Diagnosis not present

## 2017-12-16 DIAGNOSIS — Z9114 Patient's other noncompliance with medication regimen: Secondary | ICD-10-CM | POA: Diagnosis not present

## 2017-12-16 DIAGNOSIS — I2510 Atherosclerotic heart disease of native coronary artery without angina pectoris: Secondary | ICD-10-CM | POA: Diagnosis not present

## 2017-12-16 DIAGNOSIS — Z87891 Personal history of nicotine dependence: Secondary | ICD-10-CM | POA: Diagnosis not present

## 2017-12-16 DIAGNOSIS — Z8546 Personal history of malignant neoplasm of prostate: Secondary | ICD-10-CM | POA: Diagnosis not present

## 2017-12-16 DIAGNOSIS — I714 Abdominal aortic aneurysm, without rupture: Secondary | ICD-10-CM | POA: Diagnosis not present

## 2017-12-16 DIAGNOSIS — Z9842 Cataract extraction status, left eye: Secondary | ICD-10-CM | POA: Diagnosis not present

## 2017-12-16 DIAGNOSIS — Z794 Long term (current) use of insulin: Secondary | ICD-10-CM | POA: Diagnosis not present

## 2017-12-16 DIAGNOSIS — E113513 Type 2 diabetes mellitus with proliferative diabetic retinopathy with macular edema, bilateral: Secondary | ICD-10-CM | POA: Diagnosis not present

## 2017-12-16 DIAGNOSIS — Z79899 Other long term (current) drug therapy: Secondary | ICD-10-CM | POA: Diagnosis not present

## 2017-12-16 DIAGNOSIS — Z923 Personal history of irradiation: Secondary | ICD-10-CM | POA: Diagnosis not present

## 2017-12-16 DIAGNOSIS — I10 Essential (primary) hypertension: Secondary | ICD-10-CM | POA: Diagnosis not present

## 2017-12-16 DIAGNOSIS — Z961 Presence of intraocular lens: Secondary | ICD-10-CM | POA: Diagnosis not present

## 2017-12-16 DIAGNOSIS — Z9841 Cataract extraction status, right eye: Secondary | ICD-10-CM | POA: Diagnosis not present

## 2017-12-16 DIAGNOSIS — E039 Hypothyroidism, unspecified: Secondary | ICD-10-CM | POA: Diagnosis not present

## 2017-12-16 DIAGNOSIS — I4891 Unspecified atrial fibrillation: Secondary | ICD-10-CM | POA: Diagnosis not present

## 2017-12-16 DIAGNOSIS — G4733 Obstructive sleep apnea (adult) (pediatric): Secondary | ICD-10-CM | POA: Diagnosis not present

## 2017-12-16 DIAGNOSIS — Z881 Allergy status to other antibiotic agents status: Secondary | ICD-10-CM | POA: Diagnosis not present

## 2017-12-16 DIAGNOSIS — Z951 Presence of aortocoronary bypass graft: Secondary | ICD-10-CM | POA: Diagnosis not present

## 2017-12-16 DIAGNOSIS — Z7901 Long term (current) use of anticoagulants: Secondary | ICD-10-CM | POA: Diagnosis not present

## 2017-12-16 DIAGNOSIS — H1851 Endothelial corneal dystrophy: Secondary | ICD-10-CM | POA: Diagnosis not present

## 2017-12-16 HISTORY — PX: CORNEAL TRANSPLANT: SHX108

## 2017-12-28 ENCOUNTER — Other Ambulatory Visit: Payer: Self-pay | Admitting: Endocrinology

## 2017-12-28 ENCOUNTER — Ambulatory Visit (HOSPITAL_COMMUNITY)
Admission: RE | Admit: 2017-12-28 | Discharge: 2017-12-28 | Disposition: A | Discharge status: Home / Self Care | Payer: Medicare Other | Source: Ambulatory Visit | Attending: Vascular Surgery | Admitting: Vascular Surgery

## 2017-12-28 ENCOUNTER — Telehealth: Payer: Self-pay

## 2017-12-28 ENCOUNTER — Other Ambulatory Visit (INDEPENDENT_AMBULATORY_CARE_PROVIDER_SITE_OTHER): Payer: Medicare Other

## 2017-12-28 ENCOUNTER — Encounter: Payer: Self-pay | Admitting: Family

## 2017-12-28 ENCOUNTER — Ambulatory Visit (INDEPENDENT_AMBULATORY_CARE_PROVIDER_SITE_OTHER): Payer: Medicare Other | Admitting: Family

## 2017-12-28 VITALS — BP 162/86 | HR 87 | Temp 97.7°F | Resp 16 | Ht 72.0 in | Wt 204.0 lb

## 2017-12-28 DIAGNOSIS — E785 Hyperlipidemia, unspecified: Secondary | ICD-10-CM | POA: Diagnosis not present

## 2017-12-28 DIAGNOSIS — Z794 Long term (current) use of insulin: Secondary | ICD-10-CM

## 2017-12-28 DIAGNOSIS — I714 Abdominal aortic aneurysm, without rupture, unspecified: Secondary | ICD-10-CM

## 2017-12-28 DIAGNOSIS — C61 Malignant neoplasm of prostate: Secondary | ICD-10-CM | POA: Diagnosis not present

## 2017-12-28 DIAGNOSIS — Z87891 Personal history of nicotine dependence: Secondary | ICD-10-CM | POA: Insufficient documentation

## 2017-12-28 DIAGNOSIS — E119 Type 2 diabetes mellitus without complications: Secondary | ICD-10-CM | POA: Diagnosis not present

## 2017-12-28 DIAGNOSIS — I10 Essential (primary) hypertension: Secondary | ICD-10-CM | POA: Diagnosis not present

## 2017-12-28 DIAGNOSIS — E1165 Type 2 diabetes mellitus with hyperglycemia: Secondary | ICD-10-CM | POA: Diagnosis not present

## 2017-12-28 LAB — PSA: PSA: 0.05 ng/mL — ABNORMAL LOW (ref 0.10–4.00)

## 2017-12-28 LAB — COMPREHENSIVE METABOLIC PANEL
ALT: 11 U/L (ref 0–53)
AST: 12 U/L (ref 0–37)
Albumin: 4.3 g/dL (ref 3.5–5.2)
Alkaline Phosphatase: 79 U/L (ref 39–117)
BUN: 19 mg/dL (ref 6–23)
CO2: 29 mEq/L (ref 19–32)
Calcium: 9.5 mg/dL (ref 8.4–10.5)
Chloride: 105 mEq/L (ref 96–112)
Creatinine, Ser: 1.15 mg/dL (ref 0.40–1.50)
GFR: 64.76 mL/min (ref >60.00)
Glucose, Bld: 143 mg/dL — ABNORMAL HIGH (ref 70–99)
Potassium: 4.8 mEq/L (ref 3.5–5.1)
Sodium: 143 mEq/L (ref 135–145)
Total Bilirubin: 0.7 mg/dL (ref 0.2–1.2)
Total Protein: 7.2 g/dL (ref 6.0–8.3)

## 2017-12-28 LAB — MICROALBUMIN / CREATININE URINE RATIO
Creatinine,U: 196.6 mg/dL
Microalb Creat Ratio: 7.2 mg/g (ref 0.0–30.0)
Microalb, Ur: 14.1 mg/dL — ABNORMAL HIGH (ref 0.0–1.9)

## 2017-12-28 LAB — LIPID PANEL
Cholesterol: 139 mg/dL (ref 0–200)
HDL: 46.1 mg/dL (ref >39.00)
LDL Cholesterol: 77 mg/dL (ref 0–99)
NonHDL: 93.15
Total CHOL/HDL Ratio: 3
Triglycerides: 80 mg/dL (ref 0.0–149.0)
VLDL: 16 mg/dL (ref 0.0–40.0)

## 2017-12-28 NOTE — Patient Instructions (Signed)
Abdominal Aortic Aneurysm Blood pumps away from the heart through tubes (blood vessels) called arteries. Aneurysms are weak or damaged places in the wall of an artery. It bulges out like a balloon. An abdominal aortic aneurysm happens in the main artery of the body (aorta). It can burst or tear, causing bleeding inside the body. This is an emergency. It needs treatment right away. What are the causes? The exact cause is unknown. Things that could cause this problem include:  Fat and other substances building up in the lining of a tube.  Swelling of the walls of a blood vessel.  Certain tissue diseases.  Belly (abdominal) trauma.  An infection in the main artery of the body.  What increases the risk? There are things that make it more likely for you to have an aneurysm. These include:  Being over the age of 82 years old.  Having high blood pressure (hypertension).  Being a male.  Being white.  Being very overweight (obese).  Having a family history of aneurysm.  Using tobacco products.  What are the signs or symptoms? Symptoms depend on the size of the aneurysm and how fast it grows. There may not be symptoms. If symptoms occur, they can include:  Pain (belly, side, lower back, or groin).  Feeling full after eating a small amount of food.  Feeling sick to your stomach (nauseous), throwing up (vomiting), or both.  Feeling a lump in your belly that feels like it is beating (pulsating).  Feeling like you will pass out (faint).  How is this treated?  Medicine to control blood pressure and pain.  Imaging tests to see if the aneurysm gets bigger.  Surgery. How is this prevented? To lessen your chance of getting this condition:  Stop smoking. Stop chewing tobacco.  Limit or avoid alcohol.  Keep your blood pressure, blood sugar, and cholesterol within normal limits.  Eat less salt.  Eat foods low in saturated fats and cholesterol. These are found in animal and  whole dairy products.  Eat more fiber. Fiber is found in whole grains, vegetables, and fruits.  Keep a healthy weight.  Stay active and exercise often.  This information is not intended to replace advice given to you by your health care provider. Make sure you discuss any questions you have with your health care provider. Document Released: 03/06/2013 Document Revised: 04/16/2016 Document Reviewed: 12/09/2012 Elsevier Interactive Patient Education  2017 Elsevier Inc.  

## 2017-12-28 NOTE — Telephone Encounter (Signed)
Lab results have been faxed electronically

## 2017-12-28 NOTE — Progress Notes (Signed)
[facility] [place]   CC: Follow up Abdominal Aortic Aneurysm  History of Present Illness  Steve Bailey is a 84 y.o. (01-10-1934) male who presents with chief complaint: follow up for AAA.  Previous studies demonstrate an AAA, measuring 4.4 cm x 4.4 cm.  The patient does not have back or abdominal pain.  The patient is not a smoker.   He fell off a ladder in 2017 and imaging done at that time found the AAA.   The patient denies claudication in legs with walking. The patient history of stroke or TIA symptoms.  He had a partial cornea transplant 2 weeks ago, states his sight has improved a great deal.   Pt states his blood pressure at home runs 865-784 systolic.   Diabetic: Yes, last A1C result on file was 6.3 on 08-30-17 Tobacco use: former smoker, quit in 1980    Past Medical History:  Diagnosis Date  . AAA (abdominal aortic aneurysm) ([place])   . Atrial fibrillation ([place]) dx 10/10/12   started on med 10/10/12  . CAD (coronary artery disease)    remote CABG in 1997; prior PCI to the LAD i n1990; Nuclear study in February of 2010 with an EF of 60% with repeat cath showing distal LAD disease. Managed medically  . DM (diabetes mellitus) ([place])   . History of radiation therapy 11/24/12-01/18/13   Prostate 78Gy/54f  . Hyperlipidemia   . Hypertension dx 10/10/12  . [place] 09/02[place]12   Gleason 3+4=7, vol 103 cc  . Thyroid disease dx 10/10/12   started on Synthroid  . Tobacco abuse    Past Surgical History:  Procedure Laterality Date  . ANGIOPLASTY     of his left anterior descending previously in 1990.  .Marland KitchenCARDIOVASCULAR STRESS TEST  12-2008   showed EF 60%  . CARPAL TUNNEL RELEASE     right hand  . CATARACT EXTRACTION  2010   bilateral  . CORONARY ARTERY BYPASS GRAFT  11-1995   shows distal LAD disease, without recurrent symptoms of angina with questionable mild apical ischemia but with normal EF   . ESOPHAGOGASTRODUODENOSCOPY (EGD) WITH ESOPHAGEAL  DILATION  05/2012  . KNEE SURGERY     left, arthroscopic   Social History Social History   Socioeconomic History  . Marital status: Married    Spouse name: Not on file  . Number of children: Not on file  . Years of education: Not on file  . Highest education level: Not on file  Social Needs  . Financial resource strain: Not on file  . Food insecurity - worry: Not on file  . Food insecurity - inability: Not on file  . Transportation needs - medical: Not on file  . Transportation needs - non-medical: Not on file  Occupational History  . Not on file  Tobacco Use  . Smoking status: Former Smoker    Last attempt to quit: 10/14/1979    Years since quitting: 38.2  . Smokeless tobacco: Former [employer] developer   Types: CBeverlydate: 11/23/1978  Substance and Sexual Activity  . Alcohol use: No  . Drug use: No  . Sexual activity: No  Other Topics Concern  . Not on file  Social History Narrative  . Not on file   Family History Family History  Problem Relation Age of Onset  . Stroke Father   . Heart attack Mother   . Heart disease Mother   . Cancer Sister  lung  . Cancer Brother        prostate  . Diabetes Brother   . Stroke Brother   . Stroke Brother   . Heart disease Brother   . Heart disease Brother   . Heart attack Sister   . Heart disease Sister   . Heart attack Sister   . Cancer Sister        breast  . Heart disease Sister   . Heart attack Sister   . Heart disease Sister   . Heart disease Sister     Current Outpatient Medications on File Prior to Visit  Medication Sig Dispense Refill  . acetaminophen (TYLENOL) 500 MG tablet Take 1,300 mg by mouth at bedtime.     Marland Kitchen alfuzosin (UROXATRAL) 10 MG 24 hr tablet Take 10 mg by mouth at bedtime. Reported on 12/18/2015    . amLODipine (NORVASC) 10 MG tablet TAKE 1 TABLET (10 MG TOTAL) BY MOUTH DAILY.  3  . Cinnamon 500 MG capsule Take 500 mg by mouth 2 (two) times daily.    Marland Kitchen ELIQUIS 5 MG TABS tablet TAKE 1 TABLET BY  MOUTH TWICE A DAY 60 tablet 3  . finasteride (PROSCAR) 5 MG tablet Take 5 mg by mouth every morning. Reported on 12/18/2015    . FREESTYLE LITE test strip USE AS DIRECTED TWICE A DAY DX CODE:11.65 100 each 1  . HUMULIN 70/30 KWIKPEN (70-30) 100 UNIT/ML PEN INJECT 18 UNITS INTO THE SKIN EVERY MORNING & 22 UNITS AT SUPPER DAILY - NEEDS APPT 15 pen 2  . hydrochlorothiazide (MICROZIDE) 12.5 MG capsule TAKE ONE CAPSULE BY MOUTH EVERY DAY 30 capsule 2  . Insulin Pen Needle (PEN NEEDLES) 31G X 5 MM MISC 1 each by Does not apply route 2 (two) times daily. 90 each 2  . metFORMIN (GLUCOPHAGE-XR) 750 MG 24 hr tablet TAKE 2 TABLETS (1,500 MG TOTAL) BY MOUTH DAILY WITH BREAKFAST. 60 tablet 2  . nitroGLYCERIN (NITROSTAT) 0.4 MG SL tablet Place 1 tablet (0.4 mg total) under the tongue every 5 (five) minutes as needed. 25 tablet 3  . atorvastatin (LIPITOR) 40 MG tablet Take 1 tablet (40 mg total) by mouth daily. 90 tablet 3  . [DISCONTINUED] levothyroxine (SYNTHROID, LEVOTHROID) 25 MCG tablet Take 1 tablet (25 mcg total) by mouth daily. 30 tablet 11  . [DISCONTINUED] metoprolol succinate (TOPROL-XL) 100 MG 24 hr tablet Take 1 tablet (100 mg total) by mouth daily. Take with or immediately following a meal. 90 tablet 1  . [DISCONTINUED] pioglitazone-metformin (ACTOPLUS MET) 15-500 MG tablet TAKE 1 TABLET EVERY DAY AT SUPPER (Patient not taking: Reported on 02/17/2016) 90 tablet 0   No current facility-administered medications on file prior to visit.    Allergies  Allergen Reactions  . Cephalexin Nausea And Vomiting    ROS: See HPI for pertinent positives and negatives.  Physical Examination  Vitals:   12/28/17 0932  BP: (!) 162/86  Pulse: 87  Resp: 16  Temp: 97.7 F (36.5 C)  TempSrc: Oral  SpO2: 98%  Weight: 204 lb (92.5 kg)  Height: 6' (1.829 m)   Body mass index is 27.67 kg/m.  General: A&O x 3, WD, male. HEENT: Grossly intact and WNL.  Pulmonary: Sym exp, respirations are non labored, good  air movt, CTAB, no rales, rhonchi, or wheezing. Cardiac: Irregular rhythm with controlled rate, no detected murmur.  Carotid Bruits Right Left   Negative Negative   Adominal aortic pulse is not palpable Radial pulses are 2+ palpable  VASCULAR EXAM:                                                                                                         LE Pulses Right Left       FEMORAL  2+ palpable  2+ palpable        POPLITEAL  2+ palpable   not palpable       POSTERIOR TIBIAL  2+ palpable   3+ palpable        DORSALIS PEDIS      ANTERIOR TIBIAL not palpable  not palpable      Gastrointestinal: soft, NTND, -G/R, - HSM, - masses palpated, - CVAT B. Musculoskeletal: M/S 5/5 throughout, Extremities without ischemic changes. Skin: No rashes, no ulcers, no cellulitis.   Neurologic: CN 2-12 intact  except slight hearing loss, Pain and light touch intact in extremities are intact, Motor exam as listed above. Psychiatric: Normal thought content, mood appropriate to clinical situation.    DATA  AAA Duplex (12/28/2017):  Previous size:  4.3 cm x 4.2 cm (Date: 12-25-16)  Current size:  4.5 cm (Date: 12/28/17), Right CIA: 1.2 cm; Left CIA: 1.3 cm   Medical Decision Making  The patient is a 84 y.o. male who presents with asymptomatic AAA with 2 mm increase in size in a year, to 4.5 cm today.   Based on this patient's exam and diagnostic studies, the patient will follow up in 6 months with the following studies: AAA duplex.  Consideration for repair of AAA would be made when the size is 5.0 cm, growth > 1 cm/yr, and symptomatic status.        The patient was given information about AAA including signs, symptoms, treatment, and how to minimize the risk of enlargement and rupture of aneurysms.    I emphasized the importance of maximal medical management including strict control of blood pressure, blood glucose, and lipid levels, antiplatelet agents, obtaining  regular exercise, and continued cessation of smoking.   The patient was advised to call 911 should the patient experience sudden onset abdominal or back pain.   Thank you for allowing [place] to participate in this patient's care.  Clemon Chambers, RN, MSN, FNP-C [facility] [place] Office: [phone]  Clinic Physician: Early  12/28/2017, 9:36 AM

## 2017-12-28 NOTE — Telephone Encounter (Signed)
Please let us know when you receive and review the PSA results for this patient so we can fax ALL of the results for ALL labs drawn to Dr. Tresa Moore at [facility] at [phone]. Thank you!

## 2017-12-30 ENCOUNTER — Other Ambulatory Visit: Payer: Self-pay

## 2017-12-30 DIAGNOSIS — I714 Abdominal aortic aneurysm, without rupture, unspecified: Secondary | ICD-10-CM

## 2017-12-31 ENCOUNTER — Encounter: Payer: Self-pay | Admitting: Endocrinology

## 2017-12-31 ENCOUNTER — Ambulatory Visit (INDEPENDENT_AMBULATORY_CARE_PROVIDER_SITE_OTHER): Payer: Medicare Other | Admitting: Endocrinology

## 2017-12-31 VITALS — BP 144/96 | HR 84 | Ht 72.0 in | Wt 204.4 lb

## 2017-12-31 DIAGNOSIS — Z794 Long term (current) use of insulin: Secondary | ICD-10-CM | POA: Diagnosis not present

## 2017-12-31 DIAGNOSIS — E1165 Type 2 diabetes mellitus with hyperglycemia: Secondary | ICD-10-CM

## 2017-12-31 DIAGNOSIS — E7800 Pure hypercholesterolemia, unspecified: Secondary | ICD-10-CM | POA: Diagnosis not present

## 2017-12-31 DIAGNOSIS — E038 Other specified hypothyroidism: Secondary | ICD-10-CM

## 2017-12-31 DIAGNOSIS — I10 Essential (primary) hypertension: Secondary | ICD-10-CM | POA: Diagnosis not present

## 2017-12-31 LAB — POCT GLYCOSYLATED HEMOGLOBIN (HGB A1C): Hemoglobin A1C: 6.3

## 2017-12-31 MED ORDER — LISINOPRIL 20 MG PO TABS: 20.0000 mg | ORAL_TABLET | Freq: Every day | ORAL | 3 refills | Status: DC

## 2017-12-31 NOTE — Patient Instructions (Addendum)
Take insulin upto 30 min before meals  Check blood sugars on waking up  2/7  Also check blood sugars about 2 hours after a meal and do this after different meals by rotation  Recommended blood sugar levels on waking up is 90-130 and about 2 hours after meal is 130-160  Please bring your blood sugar monitor to each visit, thank you

## 2017-12-31 NOTE — Progress Notes (Signed)
Patient ID: Steve Bailey, male   DOB: 1934/04/17, 84 y.o.   MRN: [id number]    Reason for Appointment:  follow-up of various problems  History of Present Illness    Diagnosis: Type 2 DIABETES MELITUS, date of diagnosis: 1995              History:     Insulin regimen: Humulin 70/30,  20 units  a.c. twice a day with pens      Oral hypoglycemic drugs: Metformin ER 1500 mg daily   His A1c has been consistently under 6.5 and now 6.3   Current management, blood sugar patterns and problems:  He was told to cut back his evening dose to 18 units and he had an episode of hypoglycemia overnight but he did not do so and still takes 20 units  Despite reminders he does not bring his glucose monitor for download, has not checked it for the last few days because he ran out of strips and did not ask for prescription  Also he was told to check blood sugars at different times of the day and he is probably doing it only occasionally in the afternoon  No further overnight hypoglycemia  He says that he is generally watching portions and eating more vegetables, weight has not gone up  Less active during winter  He is taking his morning insulin before eating but probably takes his evening insulin after eating, has been consistent responses to questions  No side effects from metformin  Previously was taking Actos for insulin resistance, refuses to take this because of fear of side effects   Glucometer: ?  Freestyle  Blood sugar readings by recall:   Pcl  104-157, am 80+.   Carbohydrate intake: at meals: Mostly with vegetables, also getting some lean meats like Chicken and pork but not consistently.   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 Always use your most recent med list.          acetaminophen 500 MG tablet Commonly known as:  TYLENOL Take 1,300 mg by mouth at bedtime.   alfuzosin 10 MG 24 hr tablet Commonly known as:  UROXATRAL Take 10 mg by mouth at bedtime. Reported on 12/18/2015   amLODipine 10 MG tablet Commonly known as:  NORVASC TAKE 1 TABLET (10 MG TOTAL) BY MOUTH DAILY.   atorvastatin 40 MG tablet Commonly known as:  LIPITOR Take 1 tablet (40 mg total) by mouth daily.   Cinnamon 500 MG capsule Take 500 mg by mouth 2 (two) times daily.   ELIQUIS 5 MG Tabs tablet Generic drug:  apixaban TAKE 1 TABLET BY MOUTH TWICE A DAY   finasteride 5 MG  tablet Commonly known as:  PROSCAR Take 5 mg by mouth every morning. Reported on 12/18/2015   FREESTYLE LITE test strip Generic drug:  glucose blood USE AS DIRECTED TWICE A DAY DX CODE:11.65   HUMULIN 70/30 KWIKPEN (70-30) 100 UNIT/ML PEN Generic drug:  Insulin Isophane & Regular Human INJECT 18 UNITS INTO THE SKIN EVERY MORNING & 22 UNITS AT SUPPER DAILY - NEEDS APPT   hydrochlorothiazide 12.5 MG capsule Commonly known as:  MICROZIDE TAKE ONE CAPSULE BY MOUTH EVERY DAY   lisinopril 20 MG tablet Commonly known as:  PRINIVIL,ZESTRIL Take 1 tablet (20 mg total) by mouth daily.   metFORMIN 750 MG 24 hr tablet Commonly known as:  GLUCOPHAGE-XR TAKE 2 TABLETS (1,500 MG TOTAL) BY MOUTH DAILY WITH BREAKFAST.   nitroGLYCERIN 0.4 MG SL tablet Commonly known as:  NITROSTAT Place 1 tablet (0.4 mg total) under the tongue every 5 (five) minutes as needed.   Pen  Needles 31G X 5 MM Misc 1 each by Does not apply route 2 (two) times daily.       Allergies:  Allergies  Allergen Reactions  . Cephalexin Nausea And Vomiting    Past Medical History:  Diagnosis Date  . AAA (abdominal aortic aneurysm) ([place])   . Atrial fibrillation ([place]) dx 10/10/12   started on med 10/10/12  . CAD (coronary artery disease)    remote CABG in 1997; prior PCI to the LAD i n1990; Nuclear study in February of 2010 with an EF of 60% with repeat cath showing distal LAD disease. Managed medically  . DM (diabetes mellitus) ([place])   . History of radiation therapy 11/24/12-01/18/13   Prostate 78Gy/80fx  . Hyperlipidemia   . Hypertension dx 10/10/12  . 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 Thyroid disease dx 10/10/12   started on Synthroid  . Tobacco abuse     Past Surgical History:  Procedure Laterality Date  . ANGIOPLASTY     of his left anterior descending previously in 1990.  Marland Kitchen CARDIOVASCULAR STRESS TEST  12-2008   showed EF 60%  . CARPAL TUNNEL RELEASE     right hand  . CATARACT EXTRACTION  2010   bilateral  . CORONARY ARTERY BYPASS GRAFT  11-1995   shows distal LAD disease, without recurrent symptoms of angina with questionable mild apical ischemia but with normal EF   . ESOPHAGOGASTRODUODENOSCOPY (EGD) WITH ESOPHAGEAL DILATION  05/2012  . KNEE SURGERY     left, arthroscopic    Family History  Problem Relation Age of Onset  . Stroke Father   . Heart attack Mother   . Heart disease Mother   . Cancer Sister        lung  . Cancer Brother        prostate  . Diabetes Brother   . Stroke Brother   . Stroke Brother   . Heart disease Brother   . Heart disease Brother   . Heart attack Sister   . Heart disease Sister   . Heart attack Sister   . Cancer Sister        breast  . Heart disease Sister   . Heart attack Sister   . Heart disease Sister   . Heart disease Sister     Social History:  reports that he quit smoking about 38  years ago. He quit smokeless tobacco use about 39 years ago. His smokeless tobacco use included chew. He reports that he does not drink alcohol  or use drugs.  ROS   HYPERTENSION:   Has been present for several years.    He has been prescribed lisinopril 40 mg, amlodipine 5 mg and HCTZ   However for some reason he has not refilled his lisinopril for some time and is also confused about what medicines he is taking   He thinks his blood pressure at home is about 761 systolic usually   BP Readings from Last 3 Encounters:  12/31/17 (!) 144/96  12/28/17 (!) 162/86  08/30/17 132/84    Eye exam done annually at [facility]  Recently had surgery        HYPERLIPIDEMIA:    he has had high LDL and this has been fairly well-controlled with 40 mg Lipitor He appears to be more regular with this now   Lab Results  Component Value Date   CHOL 139 12/28/2017   HDL 46.10 12/28/2017   LDLCALC 77 12/28/2017   TRIG 80.0 12/28/2017   CHOLHDL 3 12/28/2017    Diabetic foot exam done in 10/18 showed normal monofilament sensation and absent pulses  He also is being followed by vascular surgeon for aortic aneurysm, no intervention currently planned    Examination:   BP (!) 144/96 (BP Location: Right Arm, Patient Position: Sitting, Cuff Size: Normal)   Pulse 84   Ht 6' (1.829 m)   Wt 204 lb 6.4 oz (92.7 kg)   SpO2 98%   BMI 27.72 kg/m   Body mass index is 27.72 kg/m.    Assesment/PLAN:  DIABETES, Type II on insulin:  See history of present illness for  discussion of current diabetes management and problems identified  His A1c 6.3 and stable  He continues to be well controlled on Twice a day premixed insulin  and metformin   Not clear what his blood sugar patterns are as he does not check his sugars much and mostly in the afternoon randomly  He usually forgets to bring his monitor to review these readings Blood sugar was 143 after breakfast and will also he is compliant with his  morning insulin before breakfast However he is probably not taking his evening dose consistently before eating Emphasized that because of the type of insulin he is taking he needs to take it 30 minutes before eating but this would potentially reduce late-night hypoglycemia also  He will continue the same dose for now   HYPERTENSION:   He appears not to be taking lisinopril and blood pressure is higher Since blood pressure is only mildly increased and he has no history of microalbuminuria will only start 20 mg daily for now He will check his blood pressure regularly at home also  Advised him to follow-up with his cardiologist too  ?  Mild hypothyroidism: He previously had been on levothyroxine from the cardiologist but will need to reassess his TSH on the next visit, currently.  Asymptomatic  Elayne Snare 01/02/2018, 6:17 PM

## 2018-01-06 ENCOUNTER — Other Ambulatory Visit: Payer: Self-pay | Admitting: Endocrinology

## 2018-01-10 DIAGNOSIS — C61 Malignant neoplasm of prostate: Secondary | ICD-10-CM | POA: Diagnosis not present

## 2018-01-10 DIAGNOSIS — R3912 Poor urinary stream: Secondary | ICD-10-CM | POA: Diagnosis not present

## 2018-01-11 ENCOUNTER — Other Ambulatory Visit: Payer: Self-pay

## 2018-01-11 MED ORDER — GLUCOSE BLOOD VI STRP: ORAL_STRIP | 1 refills | Status: DC

## 2018-02-07 ENCOUNTER — Ambulatory Visit (INDEPENDENT_AMBULATORY_CARE_PROVIDER_SITE_OTHER): Payer: Medicare Other | Admitting: Cardiovascular Disease

## 2018-02-07 ENCOUNTER — Encounter: Payer: Self-pay | Admitting: Cardiovascular Disease

## 2018-02-07 VITALS — BP 138/62 | HR 74 | Ht 72.0 in | Wt 209.0 lb

## 2018-02-07 DIAGNOSIS — I482 Chronic atrial fibrillation, unspecified: Secondary | ICD-10-CM

## 2018-02-07 DIAGNOSIS — I10 Essential (primary) hypertension: Secondary | ICD-10-CM

## 2018-02-07 DIAGNOSIS — I714 Abdominal aortic aneurysm, without rupture, unspecified: Secondary | ICD-10-CM

## 2018-02-07 DIAGNOSIS — I25708 Atherosclerosis of coronary artery bypass graft(s), unspecified, with other forms of angina pectoris: Secondary | ICD-10-CM

## 2018-02-07 DIAGNOSIS — E785 Hyperlipidemia, unspecified: Secondary | ICD-10-CM | POA: Diagnosis not present

## 2018-02-07 NOTE — Patient Instructions (Addendum)

## 2018-02-07 NOTE — Progress Notes (Signed)
SUBJECTIVE: The patient presents for routine follow-up of coronary artery disease. He has a history of coronary artery bypass graft surgery, with his most recent cardiac catheterization performed in 2010. He also has hypertension, hyperlipidemia, IDDM, and atrial fibrillation. He is noted to be noncompliant with his medication regimen.   I reviewed his lipid panel dated 12/28/17: Total cholesterol 139, triglycerides 80, HDL 46, LDL 77.  LDL had been 145 9 months prior.  The patient denies any symptoms of chest pain, palpitations, shortness of breath, lightheadedness, dizziness, leg swelling, orthopnea, PND, and syncope.  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  ECG performed today which I reviewed demonstrates rate controlled atrial fibrillation, 78 bpm.    Review of Systems: As per "subjective", otherwise negative.  Allergies  Allergen Reactions  . Cephalexin Nausea And Vomiting    Current Outpatient Medications  Medication Sig Dispense Refill  . acetaminophen (TYLENOL) 500 MG tablet Take 1,300 mg by mouth at bedtime.     Marland Kitchen alfuzosin (UROXATRAL) 10 MG 24 hr tablet Take 10 mg by mouth at bedtime. Reported on 12/18/2015    . amLODipine (NORVASC) 10 MG tablet TAKE 1 TABLET (10 MG TOTAL) BY MOUTH DAILY.  3  . atorvastatin (LIPITOR) 40 MG tablet Take 1 tablet (40 mg total) by mouth daily. 90 tablet 3  . Cinnamon 500 MG capsule Take 500 mg by mouth 2 (two) times daily.    Marland Kitchen ELIQUIS 5 MG TABS tablet TAKE 1 TABLET BY MOUTH TWICE A DAY 60 tablet 3  . finasteride (PROSCAR) 5 MG tablet Take 5 mg by mouth every morning. Reported on 12/18/2015    . glucose blood (FREESTYLE LITE) test strip USE AS DIRECTED TWICE A DAY DX CODE:11.65 300 each 1  . HUMULIN 70/30 KWIKPEN (70-30) 100 UNIT/ML PEN INJECT 18 UNITS INTO THE SKIN EVERY MORNING & 22 UNITS AT SUPPER DAILY - NEEDS APPT 15 pen 2  . hydrochlorothiazide (MICROZIDE) 12.5 MG  capsule TAKE ONE CAPSULE BY MOUTH EVERY DAY 30 capsule 2  . Insulin Pen Needle (PEN NEEDLES) 31G X 5 MM MISC 1 each by Does not apply route 2 (two) times daily. 90 each 2  . lisinopril (PRINIVIL,ZESTRIL) 20 MG tablet Take 1 tablet (20 mg total) by mouth daily. 30 tablet 3  . metFORMIN (GLUCOPHAGE-XR) 750 MG 24 hr tablet TAKE 2 TABLETS (1,500 MG TOTAL) BY MOUTH DAILY WITH BREAKFAST. 60 tablet 2  . nitroGLYCERIN (NITROSTAT) 0.4 MG SL tablet Place 1 tablet (0.4 mg total) under the tongue every 5 (five) minutes as needed. 25 tablet 3  . UNABLE TO FIND Med Name: eye drops     No current facility-administered medications for this visit.     Past Medical History:  Diagnosis Date  . AAA (abdominal aortic aneurysm) ([place])   . Atrial fibrillation ([place]) dx 10/10/12   started on med 10/10/12  . CAD (coronary artery disease)    remote CABG in 1997; prior PCI to the LAD i n1990; Nuclear study in February of 2010 with an EF of 60% with repeat cath showing distal LAD disease. Managed medically  . DM (diabetes mellitus) ([place])   . History of radiation therapy 11/24/12-01/18/13   Prostate 78Gy/60fx  . Hyperlipidemia   . Hypertension dx 10/10/12  . 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 Thyroid disease dx 10/10/12   started on Synthroid  . Tobacco abuse  Past Surgical History:  Procedure Laterality Date  . ANGIOPLASTY     of his left anterior descending previously in 1990.  Marland Kitchen CARDIOVASCULAR STRESS TEST  12-2008   showed EF 60%  . CARPAL TUNNEL RELEASE     right hand  . CATARACT EXTRACTION  2010   bilateral  . CORONARY ARTERY BYPASS GRAFT  11-1995   shows distal LAD disease, without recurrent symptoms of angina with questionable mild apical ischemia but with normal EF   . ESOPHAGOGASTRODUODENOSCOPY (EGD) WITH ESOPHAGEAL DILATION  05/2012  . KNEE SURGERY     left, arthroscopic    Social History   Socioeconomic History  . Marital status: Married    Spouse name: Not on  file  . Number of children: Not on file  . Years of education: Not on file  . Highest education level: Not on file  Social Needs  . Financial resource strain: Not on file  . Food insecurity - worry: Not on file  . Food insecurity - inability: Not on file  . Transportation needs - medical: Not on file  . Transportation needs - non-medical: Not on file  Occupational History  . Not on file  Tobacco Use  . Smoking status: Former Smoker    Last attempt to quit: 10/14/1979    Years since quitting: 38.3  . Smokeless tobacco: Former Systems developer    Types: [place] date: 11/23/1978  Substance and Sexual Activity  . Alcohol use: No  . Drug use: No  . Sexual activity: No  Other Topics Concern  . Not on file  Social History Narrative  . Not on file     Vitals:   02/07/18 1630  BP: 138/62  Pulse: 74  SpO2: 98%  Weight: 209 lb (94.8 kg)  Height: 6' (1.829 m)    Wt Readings from Last 3 Encounters:  02/07/18 209 lb (94.8 kg)  12/31/17 204 lb 6.4 oz (92.7 kg)  12/28/17 204 lb (92.5 kg)     PHYSICAL EXAM General: NAD HEENT: Normal. Neck: No JVD, no thyromegaly. Lungs: Clear to auscultation bilaterally with normal respiratory effort. CV: Regular rate and irregular rhythm, normal S1/S2, no S3, no murmur. No pretibial or periankle edema.  No carotid bruit.   Abdomen: Soft, nontender, no distention.  Neurologic: Alert and oriented.  Psych: Normal affect. Skin: Normal. Musculoskeletal: No gross deformities.    ECG: Most recent ECG reviewed.   Labs: Lab Results  Component Value Date/Time   K 4.8 12/28/2017 10:11 AM   BUN 19 12/28/2017 10:11 AM   BUN 23 09/11/2016 04:45 PM   CREATININE 1.15 12/28/2017 10:11 AM   ALT 11 12/28/2017 10:11 AM   TSH 2.62 04/03/2016 08:48 AM   HGB 12.8 (L) 04/29/2017 02:20 PM     Lipids: Lab Results  Component Value Date/Time   LDLCALC 77 12/28/2017 10:11 AM   CHOL 139 12/28/2017 10:11 AM   TRIG 80.0 12/28/2017 10:11 AM   HDL 46.10  12/28/2017 10:11 AM       ASSESSMENT AND PLAN:  1. CAD s/p CABG: Symptomatically stable. Continue Lipitor and metoprolol. As he is on Eliquis, I am avoiding ASA.  2. Essential HTN: Controlled. No changes.  3. Hyperlipidemia: I reviewed his lipid panel dated 12/28/17: Total cholesterol 139, triglycerides 80, HDL 46, LDL 77.  LDL had been 145 9 months prior. Continue Lipitor 40 mg.  4. Permanent atrial fibrillation: HR controlled on Toprol-XL 100 mg daily. Anticoagulated with Eliquis. No changes.  5. AAA: Largest diameter 4.5 cm on 12/28/17, previously 4.3 cm. Followed by vascular surgery.     Disposition: Follow up 6 months   Kate Sable, M.D., F.A.C.C.

## 2018-03-04 ENCOUNTER — Other Ambulatory Visit: Payer: Self-pay | Admitting: Cardiovascular Disease

## 2018-03-28 ENCOUNTER — Other Ambulatory Visit (INDEPENDENT_AMBULATORY_CARE_PROVIDER_SITE_OTHER): Payer: Medicare Other

## 2018-03-28 DIAGNOSIS — E038 Other specified hypothyroidism: Secondary | ICD-10-CM | POA: Diagnosis not present

## 2018-03-28 DIAGNOSIS — E1165 Type 2 diabetes mellitus with hyperglycemia: Secondary | ICD-10-CM | POA: Diagnosis not present

## 2018-03-28 DIAGNOSIS — Z794 Long term (current) use of insulin: Secondary | ICD-10-CM | POA: Diagnosis not present

## 2018-03-28 LAB — COMPREHENSIVE METABOLIC PANEL
ALT: 10 U/L (ref 0–53)
AST: 15 U/L (ref 0–37)
Albumin: 3.9 g/dL (ref 3.5–5.2)
Alkaline Phosphatase: 80 U/L (ref 39–117)
BUN: 20 mg/dL (ref 6–23)
CO2: 25 mEq/L (ref 19–32)
Calcium: 9.1 mg/dL (ref 8.4–10.5)
Chloride: 105 mEq/L (ref 96–112)
Creatinine, Ser: 1.02 mg/dL (ref 0.40–1.50)
GFR: 74.33 mL/min (ref >60.00)
Glucose, Bld: 194 mg/dL — ABNORMAL HIGH (ref 70–99)
Potassium: 4.3 mEq/L (ref 3.5–5.1)
Sodium: 138 mEq/L (ref 135–145)
Total Bilirubin: 0.5 mg/dL (ref 0.2–1.2)
Total Protein: 6.5 g/dL (ref 6.0–8.3)

## 2018-03-28 LAB — HEMOGLOBIN A1C: Hgb A1c MFr Bld: 6.8 % — ABNORMAL HIGH (ref 4.6–6.5)

## 2018-03-28 LAB — TSH: TSH: 3.9 u[IU]/mL (ref 0.35–4.50)

## 2018-03-28 LAB — T4, FREE: Free T4: 0.87 ng/dL (ref 0.60–1.60)

## 2018-03-29 ENCOUNTER — Other Ambulatory Visit: Payer: Self-pay | Admitting: Endocrinology

## 2018-03-30 ENCOUNTER — Ambulatory Visit (INDEPENDENT_AMBULATORY_CARE_PROVIDER_SITE_OTHER): Payer: Medicare Other | Admitting: Endocrinology

## 2018-03-30 ENCOUNTER — Encounter: Payer: Self-pay | Admitting: Endocrinology

## 2018-03-30 VITALS — BP 140/70 | HR 70 | Ht 72.0 in | Wt 209.0 lb

## 2018-03-30 DIAGNOSIS — R7989 Other specified abnormal findings of blood chemistry: Secondary | ICD-10-CM

## 2018-03-30 DIAGNOSIS — Z794 Long term (current) use of insulin: Secondary | ICD-10-CM

## 2018-03-30 DIAGNOSIS — E1165 Type 2 diabetes mellitus with hyperglycemia: Secondary | ICD-10-CM | POA: Diagnosis not present

## 2018-03-30 DIAGNOSIS — I25708 Atherosclerosis of coronary artery bypass graft(s), unspecified, with other forms of angina pectoris: Secondary | ICD-10-CM

## 2018-03-30 NOTE — Addendum Note (Signed)
Addended by: Elayne Snare on: 03/30/2018 08:38 PM   Modules accepted: Orders

## 2018-03-30 NOTE — Patient Instructions (Signed)
Check blood sugars on waking up 2/7 days   Also check blood sugars about 2 hours after a meal and do this after different meals by rotation  Recommended blood sugar levels on waking up is 90-130 and about 2 hours after meal is 130-160  Please bring your blood sugar monitor to each visit, thank you  Take pm shot at mealtime

## 2018-03-30 NOTE — Progress Notes (Addendum)
Patient ID: Steve Bailey, male   DOB: June 09, 1934, 84 y.o.   MRN: [id number]    Reason for Appointment:  follow-up of various problems  History of Present Illness    Diagnosis: Type 2 DIABETES MELITUS, date of diagnosis: 1995              History:     Insulin regimen: Humulin 70/30,  20 units  a.c. twice a day with pens      Oral hypoglycemic drugs: Metformin ER 1500 mg daily   His A1c has been consistently under 7% and now going to be higher at 6.8   Current management, blood sugar patterns and problems:  He was advised to check his blood sugars regularly and some after meals and bring his monitor for download but he keeps forgetting to do that  He again says that he does not have test strips but he tested his blood sugar 3 days ago  Does not report hypoglycemic symptoms  He is more active lately working outside but his weight has not come down in the last couple of months  He may have some time to get his insulin at suppertime and may take it at bedtime but usually takes it consistently while preparing breakfast in the morning  His daughter was present in the office today and could not add much more to the history  Previously was taking Actos for insulin resistance, he stopped taking this because of fear of side effects   Glucometer: ?  Freestyle  Blood sugar readings by recall:    118 -140, frequency of monitoring unknown   Carbohydrate intake: at meals: Mostly with vegetables, also getting some lean meats like Chicken and pork but not consistently.   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 Always use your most recent med list.          acetaminophen 500 MG tablet Commonly known as:  TYLENOL Take 1,300 mg by mouth at bedtime.   alfuzosin 10 MG 24 hr tablet Commonly known as:  UROXATRAL Take 10 mg by mouth at bedtime. Reported on 12/18/2015   amLODipine 10 MG tablet Commonly known as:  NORVASC TAKE 1 TABLET (10 MG TOTAL) BY MOUTH DAILY.   atorvastatin 40 MG tablet Commonly known as:  LIPITOR Take 1 tablet (40 mg total) by mouth daily.   Cinnamon 500 MG capsule Take 500 mg by mouth 2 (two) times daily.   ELIQUIS 5 MG Tabs tablet Generic drug:  apixaban TAKE 1 TABLET BY MOUTH TWICE A DAY   finasteride 5 MG tablet Commonly known as:  PROSCAR Take 5 mg by mouth every morning. Reported on 12/18/2015   glucose blood  test strip Commonly known as:  FREESTYLE LITE USE AS DIRECTED TWICE A DAY DX CODE:11.65   HUMULIN 70/30 KWIKPEN (70-30) 100 UNIT/ML PEN Generic drug:  Insulin Isophane & Regular Human INJECT 18 UNITS INTO THE SKIN EVERY MORNING & 22 UNITS AT SUPPER DAILY - NEEDS APPT   hydrochlorothiazide 12.5 MG capsule Commonly known as:  MICROZIDE TAKE ONE CAPSULE BY MOUTH EVERY DAY   lisinopril 20 MG tablet Commonly known as:  PRINIVIL,ZESTRIL Take 1 tablet (20 mg total) by mouth daily.   metFORMIN 750 MG 24 hr tablet Commonly known as:  GLUCOPHAGE-XR TAKE 2 TABLETS (1,500 MG TOTAL) BY MOUTH DAILY WITH BREAKFAST.   nitroGLYCERIN 0.4 MG SL tablet Commonly known as:  NITROSTAT Place 1 tablet (0.4 mg total) under the tongue every 5 (five) minutes as needed.   Pen Needles 31G X 5 MM Misc 1 each by Does not apply route 2 (two) times daily.   UNABLE TO FIND Med Name: eye drops        Allergies:  Allergies  Allergen Reactions  . Cephalexin Nausea And Vomiting    Past Medical History:  Diagnosis Date  . AAA (abdominal aortic aneurysm) ([place])   . Atrial fibrillation ([place]) dx 10/10/12   started on med 10/10/12  . CAD (coronary artery disease)    remote CABG in 1997; prior PCI to the LAD i n1990; Nuclear study in February of 2010 with an EF of 60% with repeat cath showing distal LAD disease. Managed medically  . DM (diabetes mellitus) ([place])   . History of radiation therapy 11/24/12-01/18/13   Prostate 78Gy/77fx  . Hyperlipidemia   . Hypertension dx 10/10/12  . 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 Thyroid disease dx 10/10/12   started on Synthroid  . Tobacco abuse     Past Surgical History:  Procedure Laterality Date  . ANGIOPLASTY     of his left anterior descending previously in 1990.  Marland Kitchen CARDIOVASCULAR STRESS TEST  12-2008   showed EF 60%  . CARPAL TUNNEL RELEASE     right hand  . CATARACT EXTRACTION  2010   bilateral  . CORONARY ARTERY BYPASS GRAFT  11-1995   shows distal LAD disease, without recurrent symptoms of angina with questionable mild apical ischemia but with normal EF   . ESOPHAGOGASTRODUODENOSCOPY (EGD) WITH ESOPHAGEAL DILATION  05/2012  . KNEE SURGERY     left, arthroscopic    Family History  Problem Relation Age of Onset  . Stroke Father   . Heart attack Mother   . Heart disease Mother   . Cancer Sister        lung  . Cancer Brother        prostate  . Diabetes Brother   . Stroke Brother   . Stroke Brother   . Heart disease Brother   . Heart disease Brother   . Heart attack Sister   . Heart disease Sister   . Heart attack Sister   . Cancer Sister        breast  . Heart disease Sister   . Heart attack Sister   . Heart disease Sister   . Heart disease Sister     Social History:  reports that he quit smoking about 38 years ago. He quit smokeless tobacco use about 39 years ago. His smokeless tobacco  use included chew. He reports that he does not drink alcohol or use drugs.  ROS   HYPERTENSION:  Has been present for several years.    He has been prescribed lisinopril 20 mg, amlodipine 5 mg and HCTZ   Blood pressure was higher in 2/19 when he ran out of his lisinopril   He thinks his blood pressure at home is < 945 systolic usually   BP Readings from Last 3 Encounters:  03/30/18 140/70  02/07/18 138/62  12/31/17 (!) 144/96    Eye exam done annually at [facility]:    he has had high LDL and this has been fairly well-controlled with 40 mg Lipitor Labs as follows:   Lab Results  Component Value Date   CHOL 139 12/28/2017   HDL 46.10 12/28/2017   LDLCALC 77 12/28/2017   TRIG 80.0 12/28/2017   CHOLHDL 3 12/28/2017    Diabetic foot exam done in 10/18 showed normal monofilament sensation and absent pulses  He also is being followed by vascular surgeon for aortic aneurysm    Examination:   BP 140/70 (BP Location: Left Arm, Patient Position: Sitting, Cuff Size: Normal)   Pulse 70   Ht 6' (1.829 m)   Wt 209 lb (94.8 kg)   SpO2 97%   BMI 28.35 kg/m   Body mass index is 28.35 kg/m.    Assesment/PLAN:  DIABETES, Type II on insulin:  See history of present illness for  discussion of current diabetes management and problems identified  His A1c 6.8 and similar to previous levels  He continues to be well controlled on Twice a day premixed insulin  and metformin This is a long-standing regimen He may not be taking his evening insulin consistently but trying to take it at breakfast consistently Most likely he is having some difficulties remembering especially to check his blood sugars and does not bring his monitor for confirmation of this  Discussed that he should not take any insulin at bedtime if he forgets to do it at suppertime to prevent nocturnal hypoglycemia His daughter will try to help him remember to check his blood sugar then  take insulin consistently  He will continue the same dose of insulin at 20 units twice daily   HYPERTENSION:   He has been controlled with resuming lisinopril and will continue same regimen Also followed by cardiologist  Weight gain and mild memory difficulties: We will recheck TSH on the next visit  Elayne Snare 03/30/2018, 3:50 PM

## 2018-04-04 ENCOUNTER — Other Ambulatory Visit: Payer: Self-pay | Admitting: Endocrinology

## 2018-04-06 ENCOUNTER — Other Ambulatory Visit: Payer: Self-pay | Admitting: Endocrinology

## 2018-04-27 DIAGNOSIS — L820 Inflamed seborrheic keratosis: Secondary | ICD-10-CM | POA: Diagnosis not present

## 2018-04-27 DIAGNOSIS — L814 Other melanin hyperpigmentation: Secondary | ICD-10-CM | POA: Diagnosis not present

## 2018-04-27 DIAGNOSIS — D225 Melanocytic nevi of trunk: Secondary | ICD-10-CM | POA: Diagnosis not present

## 2018-04-27 DIAGNOSIS — D485 Neoplasm of uncertain behavior of skin: Secondary | ICD-10-CM | POA: Diagnosis not present

## 2018-04-27 DIAGNOSIS — L570 Actinic keratosis: Secondary | ICD-10-CM | POA: Diagnosis not present

## 2018-04-27 DIAGNOSIS — D1801 Hemangioma of skin and subcutaneous tissue: Secondary | ICD-10-CM | POA: Diagnosis not present

## 2018-04-28 ENCOUNTER — Other Ambulatory Visit: Payer: Self-pay | Admitting: Endocrinology

## 2018-05-03 ENCOUNTER — Other Ambulatory Visit: Payer: Self-pay | Admitting: Endocrinology

## 2018-05-29 ENCOUNTER — Other Ambulatory Visit: Payer: Self-pay | Admitting: Cardiovascular Disease

## 2018-06-04 ENCOUNTER — Other Ambulatory Visit: Payer: Self-pay | Admitting: Endocrinology

## 2018-06-09 ENCOUNTER — Other Ambulatory Visit: Payer: Self-pay | Admitting: Endocrinology

## 2018-06-17 DIAGNOSIS — Z947 Corneal transplant status: Secondary | ICD-10-CM | POA: Diagnosis not present

## 2018-06-17 DIAGNOSIS — Z961 Presence of intraocular lens: Secondary | ICD-10-CM | POA: Diagnosis not present

## 2018-06-17 DIAGNOSIS — H1851 Endothelial corneal dystrophy: Secondary | ICD-10-CM | POA: Diagnosis not present

## 2018-06-28 ENCOUNTER — Other Ambulatory Visit: Payer: Self-pay

## 2018-06-28 ENCOUNTER — Ambulatory Visit (HOSPITAL_COMMUNITY)
Admission: RE | Admit: 2018-06-28 | Discharge: 2018-06-28 | Disposition: A | Discharge status: Home / Self Care | Payer: Medicare Other | Source: Ambulatory Visit | Attending: Family | Admitting: Family

## 2018-06-28 ENCOUNTER — Encounter: Payer: Self-pay | Admitting: Family

## 2018-06-28 ENCOUNTER — Ambulatory Visit (INDEPENDENT_AMBULATORY_CARE_PROVIDER_SITE_OTHER): Payer: Medicare Other | Admitting: Family

## 2018-06-28 VITALS — BP 191/99 | HR 72 | Resp 20 | Ht 72.0 in | Wt 200.0 lb

## 2018-06-28 DIAGNOSIS — I714 Abdominal aortic aneurysm, without rupture, unspecified: Secondary | ICD-10-CM

## 2018-06-28 DIAGNOSIS — R0989 Other specified symptoms and signs involving the circulatory and respiratory systems: Secondary | ICD-10-CM | POA: Diagnosis not present

## 2018-06-28 DIAGNOSIS — I25708 Atherosclerosis of coronary artery bypass graft(s), unspecified, with other forms of angina pectoris: Secondary | ICD-10-CM

## 2018-06-28 NOTE — Patient Instructions (Signed)
Before your next abdominal ultrasound:  Avoid gas forming foods and beverages the day before the test.   Take two Extra-Strength Gas-X capsules at bedtime the night before the test. Take another two Extra-Strength Gas-X capsules in the middle of the night if you get up to the restroom, if not, first thing in the morning with water.  Do not chew gum.      Abdominal Aortic Aneurysm Blood pumps away from the heart through tubes (blood vessels) called arteries. Aneurysms are weak or damaged places in the wall of an artery. It bulges out like a balloon. An abdominal aortic aneurysm happens in the main artery of the body (aorta). It can burst or tear, causing bleeding inside the body. This is an emergency. It needs treatment right away. What are the causes? The exact cause is unknown. Things that could cause this problem include:  Fat and other substances building up in the lining of a tube.  Swelling of the walls of a blood vessel.  Certain tissue diseases.  Belly (abdominal) trauma.  An infection in the main artery of the body.  What increases the risk? There are things that make it more likely for you to have an aneurysm. These include:  Being over the age of 82 years old.  Having high blood pressure (hypertension).  Being a male.  Being white.  Being very overweight (obese).  Having a family history of aneurysm.  Using tobacco products.  What are the signs or symptoms? Symptoms depend on the size of the aneurysm and how fast it grows. There may not be symptoms. If symptoms occur, they can include:  Pain (belly, side, lower back, or groin).  Feeling full after eating a small amount of food.  Feeling sick to your stomach (nauseous), throwing up (vomiting), or both.  Feeling a lump in your belly that feels like it is beating (pulsating).  Feeling like you will pass out (faint).  How is this treated?  Medicine to control blood pressure and pain.  Imaging tests to  see if the aneurysm gets bigger.  Surgery. How is this prevented? To lessen your chance of getting this condition:  Stop smoking. Stop chewing tobacco.  Limit or avoid alcohol.  Keep your blood pressure, blood sugar, and cholesterol within normal limits.  Eat less salt.  Eat foods low in saturated fats and cholesterol. These are found in animal and whole dairy products.  Eat more fiber. Fiber is found in whole grains, vegetables, and fruits.  Keep a healthy weight.  Stay active and exercise often.  This information is not intended to replace advice given to you by your health care provider. Make sure you discuss any questions you have with your health care provider. Document Released: 03/06/2013 Document Revised: 04/16/2016 Document Reviewed: 12/09/2012 Elsevier Interactive Patient Education  2017 Elsevier Inc.  

## 2018-06-28 NOTE — Progress Notes (Signed)
[facility] [place]   CC: Follow up Abdominal Aortic Aneurysm  History of Present Illness  Steve Bailey is a 84 y.o. ([birth year]) male whom Dr. Bridgett Larsson had been monitoring for AAA. Previous studies demonstrate an AAA, measuring 4.4 cm x 4.4cm. The patient does nothave back or abdominal pain. The patient is nota smoker.   He fell off a ladder in 2017 and imaging done at that time found the AAA.   The patient denies claudication type symptoms in his legs with walking. The patient history of stroke or TIA symptoms.  Pt states his blood pressure at home runs 630-160 systolic.  He has been in remission from prostate cancer.   Diabetic: Yes, last A1C result on file was 6.8 on 03-28-18, in control Tobacco use: former smoker, quit in 1980     Past Medical History:  Diagnosis Date  . AAA (abdominal aortic aneurysm) ([place])   . Atrial fibrillation ([place]) dx 10/10/12   started on med 10/10/12  . CAD (coronary artery disease)    remote CABG in 1997; prior PCI to the LAD i n1990; Nuclear study in February of 2010 with an EF of 60% with repeat cath showing distal LAD disease. Managed medically  . DM (diabetes mellitus) ([place])   . History of radiation therapy 11/24/12-01/18/13   Prostate 78Gy/57fx  . Hyperlipidemia   . Hypertension dx 10/10/12  . Prostate cancer ([place]) 09/02/12   Gleason 3+4=7, vol 103 cc  . Thyroid disease dx 10/10/12   started on Synthroid  . Tobacco abuse    Past Surgical History:  Procedure Laterality Date  . ANGIOPLASTY     of his left anterior descending previously in 1990.  Marland Kitchen CARDIOVASCULAR STRESS TEST  12-2008   showed EF 60%  . CARPAL TUNNEL RELEASE     right hand  . CATARACT EXTRACTION  2010   bilateral  . CORONARY ARTERY BYPASS GRAFT  11-1995   shows distal LAD disease, without recurrent symptoms of angina with questionable mild apical ischemia but with normal EF   . ESOPHAGOGASTRODUODENOSCOPY (EGD) WITH ESOPHAGEAL DILATION  05/2012  .  KNEE SURGERY     left, arthroscopic   Social History Social History   Socioeconomic History  . Marital status: Married    Spouse name: Not on file  . Number of children: Not on file  . Years of education: Not on file  . Highest education level: Not on file  Occupational History  . Not on file  Social Needs  . Financial resource strain: Not on file  . Food insecurity:    Worry: Not on file    Inability: Not on file  . Transportation needs:    Medical: Not on file    Non-medical: Not on file  Tobacco Use  . Smoking status: Former Smoker    Last attempt to quit: 10/14/1979    Years since quitting: 38.7  . Smokeless tobacco: Former Systems developer    Types: [place] date: 11/23/1978  Substance and Sexual Activity  . Alcohol use: No  . Drug use: No  . Sexual activity: Never  Lifestyle  . Physical activity:    Days per week: Not on file    Minutes per session: Not on file  . Stress: Not on file  Relationships  . Social connections:    Talks on phone: Not on file    Gets together: Not on file    Attends religious service: Not on file  Active member of club or organization: Not on file    Attends meetings of clubs or organizations: Not on file    Relationship status: Not on file  . Intimate partner violence:    Fear of current or ex partner: Not on file    Emotionally abused: Not on file    Physically abused: Not on file    Forced sexual activity: Not on file  Other Topics Concern  . Not on file  Social History Narrative  . Not on file   Family History Family History  Problem Relation Age of Onset  . Stroke Father   . Heart attack Mother   . Heart disease Mother   . Cancer Sister        lung  . Cancer Brother        prostate  . Diabetes Brother   . Stroke Brother   . Stroke Brother   . Heart disease Brother   . Heart disease Brother   . Heart attack Sister   . Heart disease Sister   . Heart attack Sister   . Cancer Sister        breast  . Heart disease  Sister   . Heart attack Sister   . Heart disease Sister   . Heart disease Sister     Current Outpatient Medications on File Prior to Visit  Medication Sig Dispense Refill  . acetaminophen (TYLENOL) 500 MG tablet Take 1,300 mg by mouth at bedtime.     Marland Kitchen alfuzosin (UROXATRAL) 10 MG 24 hr tablet Take 10 mg by mouth at bedtime. Reported on 12/18/2015    . amLODipine (NORVASC) 10 MG tablet TAKE 1 TABLET (10 MG TOTAL) BY MOUTH DAILY.  3  . atorvastatin (LIPITOR) 40 MG tablet TAKE 1 TABLET BY MOUTH EVERY DAY 90 tablet 0  . Cinnamon 500 MG capsule Take 500 mg by mouth 2 (two) times daily.    Marland Kitchen ELIQUIS 5 MG TABS tablet TAKE 1 TABLET BY MOUTH TWICE A DAY 60 tablet 6  . finasteride (PROSCAR) 5 MG tablet Take 5 mg by mouth every morning. Reported on 12/18/2015    . glucose blood (FREESTYLE LITE) test strip USE AS DIRECTED TWICE A DAY DX CODE:11.65 300 each 1  . HUMULIN 70/30 KWIKPEN (70-30) 100 UNIT/ML PEN INJECT 18 UNITS INTO THE SKIN EVERY MORNING & 22 UNITS AT SUPPER DAILY - NEEDS APPT 15 pen 3  . hydrochlorothiazide (MICROZIDE) 12.5 MG capsule TAKE ONE CAPSULE BY MOUTH EVERY DAY 30 capsule 2  . Insulin Pen Needle (PEN NEEDLES) 31G X 5 MM MISC 1 each by Does not apply route 2 (two) times daily. 90 each 2  . lisinopril (PRINIVIL,ZESTRIL) 20 MG tablet TAKE 1 TABLET BY MOUTH EVERY DAY 30 tablet 0  . metFORMIN (GLUCOPHAGE-XR) 750 MG 24 hr tablet TAKE 2 TABLETS (1,500 MG TOTAL) BY MOUTH DAILY WITH BREAKFAST. 60 tablet 0  . nitroGLYCERIN (NITROSTAT) 0.4 MG SL tablet Place 1 tablet (0.4 mg total) under the tongue every 5 (five) minutes as needed. 25 tablet 3  . UNABLE TO FIND Med Name: eye drops     No current facility-administered medications on file prior to visit.    Allergies  Allergen Reactions  . Cephalexin Nausea And Vomiting    ROS: See HPI for pertinent positives and negatives.  Physical Examination  Vitals:   06/28/18 0933 06/28/18 0935  BP: (!) 192/88 (!) 191/99  Pulse: 72   Resp: 20    SpO2: 100%  Weight: 200 lb (90.7 kg)   Height: 6' (1.829 m)    Body mass index is 27.12 kg/m.  General: A&O x 3, WD, male. HEENT: Grossly intact and WNL.  Pulmonary: Sym exp, respirations are non labored, good air movt, CTAB, no rales, rhonchi, or wheezing. Cardiac: Irregular rhythm with controlled rate, no detected murmur.  Carotid Bruits Right Left   Negative Negative   Adominal aortic pulse is not palpable Radial pulses are 2+ palpable                          VASCULAR EXAM:                                                                                                                                           LE Pulses Right Left       FEMORAL  2+ palpable  2+ palpable        POPLITEAL  2+ palpable   not palpable       POSTERIOR TIBIAL  3+ palpable   2+ palpable        DORSALIS PEDIS      ANTERIOR TIBIAL not palpable  not palpable      Gastrointestinal: soft, NTND, -G/R, - HSM, - masses palpated, - CVAT B. Musculoskeletal: M/S 5/5 throughout, Extremities without ischemic changes. Skin: No rashes, no ulcers, no cellulitis.   Neurologic: CN 2-12 intact  except slight hearing loss, Pain and light touch intact in extremities are intact, Motor exam as listed above. Psychiatric: Normal thought content, mood appropriate to clinical situation    DATA  AAA Duplex (06/28/2018):  Previous size: 4.5 cm (Date: 12/28/17), Right CIA: 1.2 cm; Left CIA: 1.3 cm    Current size:  4.6 cm (Date: 06-28-18); Right CIA: 1.2 cm; Left CIA: 1.3 cm  Medical Decision Making  The patient is a 84 y.o. male who presents with asymptomatic AAA with 1 mm increase in size in six months, at 4.6 cm today.   Based on this patient's exam and diagnostic studies, the patient will follow up in 6 months with the following studies: AAA duplex and bilateral popliteal artery duplex (prominent popliteal pulses, no claudication).  Consideration for repair of AAA would be made when the size is  5.0 cm, growth > 1 cm/yr, and symptomatic status.        The patient was given information about AAA including signs, symptoms, treatment, and how to minimize the risk of enlargement and rupture of aneurysms.    I emphasized the importance of maximal medical management including strict control of blood pressure, blood glucose, and lipid levels, antiplatelet agents, obtaining regular exercise, and continued  cessation of smoking.   The patient was advised to call 911 should the patient experience sudden onset abdominal or back pain.   Thank you for allowing [place] to participate in this  patient's care.  Clemon Chambers, RN, MSN, FNP-C [facility] [place] [facility] Office: [phone]  Clinic Physician: Early  06/28/2018, 9:52 AM

## 2018-07-03 ENCOUNTER — Other Ambulatory Visit: Payer: Self-pay | Admitting: Endocrinology

## 2018-07-11 ENCOUNTER — Other Ambulatory Visit: Payer: Self-pay

## 2018-07-11 DIAGNOSIS — R0989 Other specified symptoms and signs involving the circulatory and respiratory systems: Secondary | ICD-10-CM

## 2018-07-11 DIAGNOSIS — I714 Abdominal aortic aneurysm, without rupture, unspecified: Secondary | ICD-10-CM

## 2018-07-14 DIAGNOSIS — C61 Malignant neoplasm of prostate: Secondary | ICD-10-CM | POA: Diagnosis not present

## 2018-07-18 DIAGNOSIS — C61 Malignant neoplasm of prostate: Secondary | ICD-10-CM | POA: Diagnosis not present

## 2018-07-18 DIAGNOSIS — N471 Phimosis: Secondary | ICD-10-CM | POA: Diagnosis not present

## 2018-07-18 DIAGNOSIS — R3912 Poor urinary stream: Secondary | ICD-10-CM | POA: Diagnosis not present

## 2018-07-21 ENCOUNTER — Encounter (HOSPITAL_BASED_OUTPATIENT_CLINIC_OR_DEPARTMENT_OTHER): Payer: Self-pay

## 2018-07-21 ENCOUNTER — Other Ambulatory Visit: Payer: Self-pay | Admitting: Urology

## 2018-07-22 ENCOUNTER — Encounter (HOSPITAL_BASED_OUTPATIENT_CLINIC_OR_DEPARTMENT_OTHER): Payer: Self-pay | Admitting: *Deleted

## 2018-07-22 ENCOUNTER — Other Ambulatory Visit: Payer: Self-pay

## 2018-07-22 NOTE — Progress Notes (Signed)
Spoke w/ pt daughter, betty, via phone for pre-op inteview. Pt very HOH, will need daughter in pre-op for meds.  Npo after mn w/ exception clear liquids until 0800 (no cream/ milk products).  Arrive at 1200.  Need istat 8.  Current ekg in chart and epic.  Daughter to call dr Tresa Moore office to get instructions when to stop eliquis.

## 2018-07-28 ENCOUNTER — Encounter (HOSPITAL_BASED_OUTPATIENT_CLINIC_OR_DEPARTMENT_OTHER)
Admission: RE | Disposition: A | Discharge status: Home / Self Care | Payer: Self-pay | Source: Ambulatory Visit | Attending: Urology

## 2018-07-28 ENCOUNTER — Other Ambulatory Visit: Payer: Self-pay

## 2018-07-28 ENCOUNTER — Ambulatory Visit (HOSPITAL_BASED_OUTPATIENT_CLINIC_OR_DEPARTMENT_OTHER): Payer: Medicare Other | Admitting: Anesthesiology

## 2018-07-28 ENCOUNTER — Encounter (HOSPITAL_BASED_OUTPATIENT_CLINIC_OR_DEPARTMENT_OTHER): Payer: Self-pay | Admitting: Anesthesiology

## 2018-07-28 ENCOUNTER — Ambulatory Visit (HOSPITAL_BASED_OUTPATIENT_CLINIC_OR_DEPARTMENT_OTHER)
Admission: RE | Admit: 2018-07-28 | Discharge: 2018-07-28 | Disposition: A | Discharge status: Home / Self Care | Payer: Medicare Other | Source: Ambulatory Visit | Attending: Urology | Admitting: Urology

## 2018-07-28 DIAGNOSIS — E119 Type 2 diabetes mellitus without complications: Secondary | ICD-10-CM | POA: Diagnosis not present

## 2018-07-28 DIAGNOSIS — E039 Hypothyroidism, unspecified: Secondary | ICD-10-CM | POA: Insufficient documentation

## 2018-07-28 DIAGNOSIS — I482 Chronic atrial fibrillation: Secondary | ICD-10-CM | POA: Diagnosis not present

## 2018-07-28 DIAGNOSIS — E785 Hyperlipidemia, unspecified: Secondary | ICD-10-CM | POA: Insufficient documentation

## 2018-07-28 DIAGNOSIS — R3915 Urgency of urination: Secondary | ICD-10-CM | POA: Diagnosis not present

## 2018-07-28 DIAGNOSIS — I2510 Atherosclerotic heart disease of native coronary artery without angina pectoris: Secondary | ICD-10-CM | POA: Diagnosis not present

## 2018-07-28 DIAGNOSIS — I714 Abdominal aortic aneurysm, without rupture: Secondary | ICD-10-CM | POA: Diagnosis not present

## 2018-07-28 DIAGNOSIS — Z79899 Other long term (current) drug therapy: Secondary | ICD-10-CM | POA: Insufficient documentation

## 2018-07-28 DIAGNOSIS — R3912 Poor urinary stream: Secondary | ICD-10-CM | POA: Diagnosis not present

## 2018-07-28 DIAGNOSIS — N471 Phimosis: Secondary | ICD-10-CM | POA: Insufficient documentation

## 2018-07-28 DIAGNOSIS — Z794 Long term (current) use of insulin: Secondary | ICD-10-CM | POA: Diagnosis not present

## 2018-07-28 DIAGNOSIS — N401 Enlarged prostate with lower urinary tract symptoms: Secondary | ICD-10-CM | POA: Insufficient documentation

## 2018-07-28 DIAGNOSIS — Z87891 Personal history of nicotine dependence: Secondary | ICD-10-CM | POA: Diagnosis not present

## 2018-07-28 DIAGNOSIS — I10 Essential (primary) hypertension: Secondary | ICD-10-CM | POA: Diagnosis not present

## 2018-07-28 HISTORY — DX: Benign prostatic hyperplasia with lower urinary tract symptoms: N40.1

## 2018-07-28 HISTORY — DX: Complete loss of teeth, unspecified cause, unspecified class: K08.109

## 2018-07-28 HISTORY — DX: Straining to void: R39.16

## 2018-07-28 HISTORY — DX: Permanent atrial fibrillation: I48.21

## 2018-07-28 HISTORY — PX: CIRCUMCISION: SHX1350

## 2018-07-28 HISTORY — DX: Long term (current) use of anticoagulants: Z79.01

## 2018-07-28 HISTORY — DX: Other cervical disc degeneration, unspecified cervical region: M50.30

## 2018-07-28 HISTORY — DX: Presence of aortocoronary bypass graft: Z95.1

## 2018-07-28 HISTORY — DX: Type 2 diabetes mellitus without complications: Z79.4

## 2018-07-28 HISTORY — DX: Unspecified hearing loss, unspecified ear: H91.90

## 2018-07-28 HISTORY — DX: Presence of dental prosthetic device (complete) (partial): Z97.2

## 2018-07-28 HISTORY — DX: Type 2 diabetes mellitus without complications: E11.9

## 2018-07-28 HISTORY — DX: Phimosis: N47.1

## 2018-07-28 HISTORY — DX: Type 2 diabetes mellitus with proliferative diabetic retinopathy without macular edema, unspecified eye: E11.3599

## 2018-07-28 LAB — POCT I-STAT, CHEM 8
BUN: 19 mg/dL (ref 8–23)
Calcium, Ion: 1.19 mmol/L (ref 1.15–1.40)
Chloride: 102 mmol/L (ref 98–111)
Creatinine, Ser: 1.1 mg/dL (ref 0.61–1.24)
Glucose, Bld: 122 mg/dL — ABNORMAL HIGH (ref 70–99)
HCT: 32 % — ABNORMAL LOW (ref 39.0–52.0)
Hemoglobin: 10.9 g/dL — ABNORMAL LOW (ref 13.0–17.0)
Potassium: 4 mmol/L (ref 3.5–5.1)
Sodium: 137 mmol/L (ref 135–145)
TCO2: 23 mmol/L (ref 22–32)

## 2018-07-28 LAB — GLUCOSE, CAPILLARY: Glucose-Capillary: 127 mg/dL — ABNORMAL HIGH (ref 70–99)

## 2018-07-28 SURGERY — CIRCUMCISION, ADULT
Anesthesia: General | Site: Penis

## 2018-07-28 MED ORDER — PHENYLEPHRINE 40 MCG/ML (10ML) SYRINGE FOR IV PUSH (FOR BLOOD PRESSURE SUPPORT)
PREFILLED_SYRINGE | INTRAVENOUS | Status: DC | PRN
  Administered 2018-07-28: 120 ug via INTRAVENOUS

## 2018-07-28 MED ORDER — FENTANYL CITRATE (PF) 100 MCG/2ML IJ SOLN
INTRAMUSCULAR | Status: AC
  Filled 2018-07-28: qty 2

## 2018-07-28 MED ORDER — LIDOCAINE 2% (20 MG/ML) 5 ML SYRINGE
INTRAMUSCULAR | Status: AC
  Filled 2018-07-28: qty 5

## 2018-07-28 MED ORDER — DEXAMETHASONE SODIUM PHOSPHATE 10 MG/ML IJ SOLN
INTRAMUSCULAR | Status: AC
  Filled 2018-07-28: qty 1

## 2018-07-28 MED ORDER — PHENYLEPHRINE 40 MCG/ML (10ML) SYRINGE FOR IV PUSH (FOR BLOOD PRESSURE SUPPORT)
PREFILLED_SYRINGE | INTRAVENOUS | Status: AC
  Filled 2018-07-28: qty 10

## 2018-07-28 MED ORDER — MEPERIDINE HCL 25 MG/ML IJ SOLN
6.2500 mg | INTRAMUSCULAR | Status: DC | PRN
  Filled 2018-07-28: qty 1

## 2018-07-28 MED ORDER — FENTANYL CITRATE (PF) 100 MCG/2ML IJ SOLN
INTRAMUSCULAR | Status: DC | PRN
  Administered 2018-07-28 (×2): 25 ug via INTRAVENOUS
  Administered 2018-07-28 (×2): 50 ug via INTRAVENOUS

## 2018-07-28 MED ORDER — BUPIVACAINE HCL 0.25 % IJ SOLN
INTRAMUSCULAR | Status: DC | PRN
  Administered 2018-07-28: 10 mL

## 2018-07-28 MED ORDER — GENTAMICIN SULFATE 40 MG/ML IJ SOLN
5.0000 mg/kg | INTRAVENOUS | Status: AC
  Administered 2018-07-28: 460 mg via INTRAVENOUS
  Filled 2018-07-28 (×2): qty 11.5

## 2018-07-28 MED ORDER — ONDANSETRON HCL 4 MG/2ML IJ SOLN
INTRAMUSCULAR | Status: AC
  Filled 2018-07-28: qty 2

## 2018-07-28 MED ORDER — CLINDAMYCIN PHOSPHATE 900 MG/50ML IV SOLN
INTRAVENOUS | Status: AC
  Filled 2018-07-28: qty 50

## 2018-07-28 MED ORDER — PROPOFOL 10 MG/ML IV BOLUS
INTRAVENOUS | Status: DC | PRN
  Administered 2018-07-28: 100 mg via INTRAVENOUS

## 2018-07-28 MED ORDER — ONDANSETRON HCL 4 MG/2ML IJ SOLN
INTRAMUSCULAR | Status: DC | PRN
  Administered 2018-07-28: 4 mg via INTRAVENOUS

## 2018-07-28 MED ORDER — DEXAMETHASONE SODIUM PHOSPHATE 4 MG/ML IJ SOLN
INTRAMUSCULAR | Status: DC | PRN
  Administered 2018-07-28: 4 mg via INTRAVENOUS

## 2018-07-28 MED ORDER — CLINDAMYCIN PHOSPHATE 900 MG/50ML IV SOLN
900.0000 mg | INTRAVENOUS | Status: AC
  Administered 2018-07-28: 900 mg via INTRAVENOUS
  Filled 2018-07-28: qty 50

## 2018-07-28 MED ORDER — TRAMADOL HCL 50 MG PO TABS: 50.0000 mg | ORAL_TABLET | Freq: Four times a day (QID) | ORAL | 0 refills | Status: DC | PRN

## 2018-07-28 MED ORDER — LACTATED RINGERS IV SOLN
INTRAVENOUS | Status: DC
  Administered 2018-07-28 (×2): via INTRAVENOUS
  Filled 2018-07-28: qty 1000

## 2018-07-28 MED ORDER — SODIUM CHLORIDE 0.9 % IR SOLN
Status: DC | PRN
  Administered 2018-07-28: 500 mL

## 2018-07-28 MED ORDER — FENTANYL CITRATE (PF) 100 MCG/2ML IJ SOLN
25.0000 ug | INTRAMUSCULAR | Status: DC | PRN
  Filled 2018-07-28: qty 1

## 2018-07-28 MED ORDER — LIDOCAINE 2% (20 MG/ML) 5 ML SYRINGE
INTRAMUSCULAR | Status: DC | PRN
  Administered 2018-07-28: 100 mg via INTRAVENOUS

## 2018-07-28 MED ORDER — PROPOFOL 10 MG/ML IV BOLUS
INTRAVENOUS | Status: AC
  Filled 2018-07-28: qty 20

## 2018-07-28 MED ORDER — LIDOCAINE HCL (PF) 1 % IJ SOLN
INTRAMUSCULAR | Status: DC | PRN
  Administered 2018-07-28: 10 mL

## 2018-07-28 SURGICAL SUPPLY — 28 items
BANDAGE CO FLEX L/F 1IN X 5YD (GAUZE/BANDAGES/DRESSINGS) ×1 IMPLANT
BANDAGE COBAN STERILE 2 (GAUZE/BANDAGES/DRESSINGS) ×2 IMPLANT
BLADE SURG 15 STRL LF DISP TIS (BLADE) ×1 IMPLANT
BLADE SURG 15 STRL SS (BLADE) ×2
BNDG CONFORM 2 STRL LF (GAUZE/BANDAGES/DRESSINGS) ×2 IMPLANT
COVER BACK TABLE 60X90IN (DRAPES) ×2 IMPLANT
COVER MAYO STAND STRL (DRAPES) ×2 IMPLANT
DRAPE LAPAROTOMY 100X72 PEDS (DRAPES) ×2 IMPLANT
ELECT NDL TIP 2.8 STRL (NEEDLE) IMPLANT
ELECT NEEDLE TIP 2.8 STRL (NEEDLE) IMPLANT
ELECT REM PT RETURN 9FT ADLT (ELECTROSURGICAL) ×2
ELECTRODE REM PT RTRN 9FT ADLT (ELECTROSURGICAL) ×1 IMPLANT
GAUZE XEROFORM 1X8 LF (GAUZE/BANDAGES/DRESSINGS) ×2 IMPLANT
GLOVE BIO SURGEON STRL SZ7.5 (GLOVE) ×2 IMPLANT
GOWN STRL REUS W/TWL LRG LVL3 (GOWN DISPOSABLE) ×3 IMPLANT
KIT TURNOVER CYSTO (KITS) ×2 IMPLANT
NDL HYPO 25X1 1.5 SAFETY (NEEDLE) ×1 IMPLANT
NEEDLE HYPO 25X1 1.5 SAFETY (NEEDLE) ×2 IMPLANT
NS IRRIG 500ML POUR BTL (IV SOLUTION) ×1 IMPLANT
PACK BASIN DAY SURGERY FS (CUSTOM PROCEDURE TRAY) ×2 IMPLANT
PENCIL BUTTON HOLSTER BLD 10FT (ELECTRODE) ×2 IMPLANT
SUT VIC AB 3-0 SH 27 (SUTURE) ×2
SUT VIC AB 3-0 SH 27X BRD (SUTURE) ×1 IMPLANT
SUT VIC AB 3-0 SH 27XBRD (SUTURE) IMPLANT
SYR CONTROL 10ML LL (SYRINGE) ×2 IMPLANT
TOWEL OR 17X24 6PK STRL BLUE (TOWEL DISPOSABLE) ×2 IMPLANT
TRAY DSU PREP LF (CUSTOM PROCEDURE TRAY) ×2 IMPLANT
WATER STERILE IRR 500ML POUR (IV SOLUTION) IMPLANT

## 2018-07-28 NOTE — Transfer of Care (Signed)
  Last Vitals:  Vitals Value Taken Time  BP 129/72 07/28/2018  2:33 PM  Temp    Pulse 67 07/28/2018  2:45 PM  Resp 12 07/28/2018  2:45 PM  SpO2 96 % 07/28/2018  2:45 PM  Vitals shown include unvalidated device data.  Last Pain:  Vitals:   07/28/18 1431  TempSrc:   PainSc: 0-No pain      Patients Stated Pain Goal: 3 (07/28/18 1249)  Immediate Anesthesia Transfer of Care Note  Patient: Steve Bailey  Procedure(s) Performed: Procedure(s) (LRB): CIRCUMCISION ADULT (N/A)  Patient Location: PACU  Anesthesia Type: General  Level of Consciousness: awake, alert  and oriented  Airway & Oxygen Therapy: Patient Spontanous Breathing and Patient connected to nasal cannula oxygen  Post-op Assessment: Report given to PACU RN and Post -op Vital signs reviewed and stable  Post vital signs: Reviewed and stable  Complications: No apparent anesthesia complications

## 2018-07-28 NOTE — Op Note (Signed)
[name], Steve Bailey MEDICAL RECORD [id number] ACCOUNT [id number] DATE OF BIRTH:1934-11-10 FACILITY: [facility] LOCATION: [facility] PHYSICIAN:Soliyana Mcchristian Tresa Moore, MD  OPERATIVE REPORT  DATE OF PROCEDURE:  07/28/2018  PREOPERATIVE DIAGNOSIS:  Severe phimosis.  PROCEDURE: 1.  Circumcision. 2.  Penile block.  ESTIMATED BLOOD LOSS:  Nil.  COMPLICATIONS:  None.  RISK SPECIMENS:  Phimotic foreskin for discard.  FINDINGS: 1.  Severe phimosis with a pinhole meatus, previous circumcision. 2.  Complete resolution of the phimotic ring following circumcision.  INDICATIONS:  The patient is a pleasant 84 year old gentleman followed for multiple urologic problems including history of prostate cancer.  He has noted progressive tightening of his foreskin for several years now becoming increasingly symptomatic with  some obstruction of his urinary flow.  Options were discussed for management including topicals versus dorsal slit versus a circumcision.  He wished to proceed with the latter.  Informed consent was obtained and placed in medical record.    DESCRIPTION OF PROCEDURE:  The patient was identified.  The procedure being circumcision and penile block was confirmed.  Procedure timeout was performed.  Intravenous antibiotics administered.  General anesthesia introduced.  The patient was placed in  supine position.  A sterile field was created by prepping and draping his penis, perineum, scrotum and proximal thighs using iodine.  Next, a very densely phimotic ring was dilated with hemostat and unfortunately this was quite loose.  This allowed  delivery of the glans penis which was then reprepped using iodine.  The proximal collar corresponding to the unstretched corona of the glans was marked as was the 12 o'clock position and the distal collar was marked approximately 5 mm proximal to the  corona of the glans.  Circumferential incisions were made at each site and the redundant prepuce was released from  underlying tissue using electrocautery.  This resulted in excellent hemostasis.  A 12 o'clock suture of interrupted 3-0 Vicryl was placed  as was a u-stitch at the area of the frenulum at the 6 o'clock and 2 separate running suture lines of 3-0 Vicryl were used from 12 o'clock to 6 o'clock each side, reapproximating the proximal and distal skin edges, which resulted in a good cosmesis and  hemostasis.  At this point, sponge and needle counts were correct.  Hemostasis appeared excellent.  Attention was directed to penile block.  Then, 10 mL of 50% Marcaine and lidocaine solution was injected in a ring block type fashion at the base of the penis followed by additional 10 mL in a dorsal block type fashion just below the area of the pubic ramus.  A dressing of Xeroform followed by  Doreene Nest and Coban was loosely applied and the procedure was terminated.  The patient tolerated the procedure well.  No immediate complications.  The patient was taken to the postanesthesia care in stable condition.  TN/NUANCE  D:07/28/2018 T:07/28/2018 JOB:[id number]

## 2018-07-28 NOTE — Anesthesia Procedure Notes (Signed)
Procedure Name: LMA Insertion Date/Time: 07/28/2018 1:43 PM Performed by: Nolon Nations, MD Pre-anesthesia Checklist: Patient identified, Emergency Drugs available, Suction available and Patient being monitored Patient Re-evaluated:Patient Re-evaluated prior to induction Oxygen Delivery Method: Circle system utilized Preoxygenation: Pre-oxygenation with 100% oxygen Induction Type: IV induction Ventilation: Mask ventilation without difficulty LMA: LMA inserted LMA Size: 5.0 Number of attempts: 1 Airway Equipment and Method: Bite block Placement Confirmation: positive ETCO2 Tube secured with: Tape Dental Injury: Teeth and Oropharynx as per pre-operative assessment

## 2018-07-28 NOTE — H&P (Signed)
Steve Bailey is an 84 y.o. male.    Chief Complaint: Pre-Op Circumcision  HPI:   1 - Moderate Risk Prostate Cancer - s/p IMRT 2013 by Ledon Snare for Gleason 3+4=7 adenocarcinoma. Pre-treatment PSA 8.12.   Recent Post-Treatment Course:   08/2014 - PSA 0.37  08/2015 - PSA 3.96, CT and bone scan w/o measurable disease ==> Begin Androgen Deprivation with Lupron 45 Q75mo  05/2016 - PSA <0.01 / T 19 --> Lupron 45;  12/2016 - PSA <0.01 / T 30 --> Lupron 45; 06/2017 PSA <0.01 / T 10 --> Lupron 45  12/2017 - PSA/T <-.05 ==> Lupron 45; 06/2018 PSA (today/penidng) ==> Lupron 45    2 - Enlarged Prostate with Weak Stream and urinary Urgency - TRUS 2013 103gm. Manages with alfuzosin + finasteride with good symptom control.   3 - Severe Phimosis - pt with progressive tightening of foreskin x years, now just pinhold foreskin meatus by exam 06/2018. No active balanitis. He is diabetic on insulin + oral meds with good control.   PMH sig for IDDM2 (A1c <7, follows Dr. Dwyane Dee), CAD/CABG, cornea transplant 2019. Still very active, gardens 2 acres.    Today " Steve Bailey " is seen to proceed with circumcision.     Past Medical History:  Diagnosis Date  . AAA (abdominal aortic aneurysm) ([place]) followed by dr Bridgett Larsson   last duplex 06/28/2018 4.6 cm,  asymptomatic  . Anticoagulant long-term use    eliquis  . Benign localized prostatic hyperplasia with lower urinary tract symptoms (LUTS)   . CAD (coronary artery disease) cardiologist-  dr Bronson Ing   remote CABG in 1997; prior PCI to the LAD i n1990; Nuclear study in February of 2012 normal with an EF of 60%   . DDD (degenerative disc disease), cervical   . Full dentures   . History of radiation therapy 11/24/12-01/18/13   Prostate 78Gy/92fx  . HOH (hard of hearing)    both  . Hyperlipidemia   . Hyperlipidemia   . Hypertension   . Permanent atrial fibrillation ([place]) 09/2012   followed by dr Bronson Ing  . Phimosis    severe  . Proliferative diabetic retinopathy  ([place])    both eyes  . Prostate cancer [facility]) urologist-- dr Tresa Moore   dx 09-02-2012-- Stage T2a, Gleason 3+4=7, PSA 8.12, vol 103 cc--- completed external beam radiation 02/ 2014;  recurrent 2015 , started hormone therapy  . S/P CABG x 3 1997  . Strains to urinate   . Type 2 diabetes mellitus treated with insulin [facility])    endocrinologist-- dr Dwyane Dee    Past Surgical History:  Procedure Laterality Date  . CARDIAC CATHETERIZATION  01-18-2004   dr Doreatha Lew   patent grafts, ef 55%, minimal anterior hypokinesis,  severe total occlusion of LCFx and LAD,  severe disease of D2 and segmental RCA  . CARDIOVASCULAR STRESS TEST  01/07/2011   normal nuclear study w/ no ischemia/  normal LV function and wall motion,  ef 60%  . CARPAL TUNNEL RELEASE Right   . CATARACT EXTRACTION W/ INTRAOCULAR LENS  IMPLANT, BILATERAL  left 2006;  right 2010  . 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 CORONARY ANGIOPLASTY  1990   LAD  . CORONARY ARTERY BYPASS GRAFT  11-1995   shows distal LAD disease, without recurrent symptoms of angina with questionable mild apical ischemia but with normal EF   . ESOPHAGOGASTRODUODENOSCOPY (EGD) WITH ESOPHAGEAL DILATION  05/2012  . KNEE ARTHROSCOPY Left   . TRANSTHORACIC ECHOCARDIOGRAM  10/10/2012  ef 55%, akinesis of the inferolateral wall,  grade 2 diastolic dysfunction/  trivial AR/  mild MR and TR/  mild LAE/  mild RVSF reduced and mild dilated RV    Family History  Problem Relation Age of Onset  . Stroke Father   . Heart attack Mother   . Heart disease Mother   . Cancer Sister        lung  . Cancer Brother        prostate  . Diabetes Brother   . Stroke Brother   . Stroke Brother   . Heart disease Brother   . Heart disease Brother   . Heart attack Sister   . Heart disease Sister   . Heart attack Sister   . Cancer Sister        breast  . Heart disease Sister   . Heart attack Sister   . Heart disease Sister   . Heart disease Sister    Social History:  reports that  he quit smoking about 38 years ago. His smoking use included cigarettes. He quit after 20.00 years of use. He quit smokeless tobacco use about 5 years ago.  His smokeless tobacco use included chew. He reports that he does not drink alcohol or use drugs.  Allergies:  Allergies  Allergen Reactions  . Cephalexin Nausea And Vomiting    No medications prior to admission.    No results found for this or any previous visit (from the past 48 hour(s)). No results found.  Review of Systems  Constitutional: Negative.  Negative for chills and fever.  HENT: Negative.   Eyes: Negative.   Respiratory: Negative.   Cardiovascular: Negative.   Gastrointestinal: Negative.   Genitourinary: Positive for frequency and urgency.  Musculoskeletal: Negative.   Skin: Negative.   Neurological: Negative.   Endo/Heme/Allergies: Negative.   Psychiatric/Behavioral: Negative.     Height 6' (1.829 m), weight 90.7 kg. Physical Exam  Constitutional: He appears well-developed.  HENT:  Head: Normocephalic.  Eyes: Pupils are equal, round, and reactive to light.  Neck: Normal range of motion.  Cardiovascular: Normal rate.  Respiratory: Effort normal.  Genitourinary:  Genitourinary Comments: Severe pinhole phimosis w/o active balanitis.   Neurological: He is alert.  Skin: Skin is warm.  Psychiatric: He has a normal mood and affect.     Assessment/Plan  Proceed as planned with circumcision. Risks, benefits, alternatives, expected peri-op course discussed previously and reiterated today.   Alexis Frock, MD 07/28/2018, 10:44 AM

## 2018-07-28 NOTE — Brief Op Note (Signed)
07/28/2018  2:21 PM  PATIENT:  Steve Bailey  84 y.o. male  PRE-OPERATIVE DIAGNOSIS:  SEVERE PHIMOSIS  POST-OPERATIVE DIAGNOSIS:  SEVERE PHIMOSIS  PROCEDURE:  Procedure(s): CIRCUMCISION ADULT (N/A)  SURGEON:  Surgeon(s) and Role:    * Alexis Frock, MD - Primary  PHYSICIAN ASSISTANT:   ASSISTANTS: none   ANESTHESIA:   local and general  EBL:  minimal   BLOOD ADMINISTERED:none  DRAINS: none   LOCAL MEDICATIONS USED:  MARCAINE    and LIDOCAINE   SPECIMEN:  Source of Specimen:  phimotic foreskin  DISPOSITION OF SPECIMEN:  discard  COUNTS:  YES  TOURNIQUET:  * No tourniquets in log *  DICTATION: .Other Dictation: Dictation Number [id number]  PLAN OF CARE: Discharge to home after PACU  PATIENT DISPOSITION:  PACU - hemodynamically stable.   Delay start of Pharmacological VTE agent (>24hrs) due to surgical blood loss or risk of bleeding: yes

## 2018-07-28 NOTE — Discharge Instructions (Signed)
1 - All stitches are dissolvable. They will disappear over the next 2-3 weeks.   2 - Please remove dressing tomorrow morning. You may shower after that.   2 - Call MD or go to ER for fever >102, severe pain / nausea / vomiting not relieved by medications, or acute change in medical status  Circumcision-Home Care Instructions  The following instructions have been prepared to help you care for yourself upon your return home today.   Wound Care & Hygiene:   You may apply ice to the penis.  This may help to decrease swelling.  Remove the dressing tomorrow.  If the dressing falls off before then, leave it off.  You may shower  in 24 hours  Gently wash the penis with soap and water.  The stitches do not need to be removed.  Activity:  Do not drive or operate any equipment today.  The effects of anesthesia are still present, drowsiness may result.  Rest today, not necessarily flat bed rest, just take it easy.  You may resume your normal activity in one to two days or as indicated by your physician.  Sexual Activity:  Erection and sexual relations should be avoided for 2 weeks.  Return to Work:  One to two days or as indicated by your physician   Diet:  Drink liquids or eat a very light diet this evening.  You may resume a regular diet tomorrow.  General Expectations of your surgery:   You may have a small amount of bleeding  The penis will be swollen and bruised for approximately one week  You may wake during the night with an erection, usually this is caused by having a full bladder so you should try to urinate (pass your water) to relieve the erection or apply ice to the penis  Unexpected Observations - Call your doctor if these occur!  Persistent or heavy bleeding  Temperature of 101 degrees or more  Severe pain not relieved by medication   Pa  Post Anesthesia Home Care Instructions  Activity: Get plenty of rest for the remainder of the day. A responsible individual must  stay with you for 24 hours following the procedure.  For the next 24 hours, DO NOT: -Drive a car -Paediatric nurse -Drink alcoholic beverages -Take any medication unless instructed by your physician -Make any legal decisions or sign important papers.  Meals: Start with liquid foods such as gelatin or soup. Progress to regular foods as tolerated. Avoid greasy, spicy, heavy foods. If nausea and/or vomiting occur, drink only clear liquids until the nausea and/or vomiting subsides. Call your physician if vomiting continues.  Special Instructions/Symptoms: Your throat may feel dry or sore from the anesthesia or the breathing tube placed in your throat during surgery. If this causes discomfort, gargle with warm salt water. The discomfort should disappear within 24 hours.  If you had a scopolamine patch placed behind your ear for the management of post- operative nausea and/or vomiting:  1. The medication in the patch is effective for 72 hours, after which it should be removed.  Wrap patch in a tissue and discard in the trash. Wash hands thoroughly with soap and water. 2. You may remove the patch earlier than 72 hours if you experience unpleasant side effects which may include dry mouth, dizziness or visual disturbances. 3. Avoid touching the patch. Wash your hands with soap and water after contact with the patch.

## 2018-07-28 NOTE — Anesthesia Preprocedure Evaluation (Signed)
Anesthesia Evaluation  Patient identified by MRN, date of birth, ID band Patient awake    Reviewed: Allergy & Precautions, NPO status , Patient's Chart, lab work & pertinent test results  Airway Mallampati: II  TM Distance: >3 FB Neck ROM: Full    Dental  (+) Dental Advisory Given   Pulmonary neg pulmonary ROS, former smoker,    Pulmonary exam normal breath sounds clear to auscultation       Cardiovascular Pt. on medications + CAD, + CABG and + Peripheral Vascular Disease  Normal cardiovascular exam+ dysrhythmias Atrial Fibrillation  Rhythm:Regular Rate:Normal     Neuro/Psych negative neurological ROS  negative psych ROS   GI/Hepatic negative GI ROS, Neg liver ROS,   Endo/Other  diabetes, Type 2Hypothyroidism   Renal/GU negative Renal ROS     Musculoskeletal  (+) Arthritis ,   Abdominal   Peds  Hematology negative hematology ROS (+)   Anesthesia Other Findings   Reproductive/Obstetrics negative OB ROS                             Anesthesia Physical Anesthesia Plan  ASA: III  Anesthesia Plan: General   Post-op Pain Management:    Induction: Intravenous  PONV Risk Score and Plan: 3 and Ondansetron, Dexamethasone and Treatment may vary due to age or medical condition  Airway Management Planned: LMA  Additional Equipment:   Intra-op Plan:   Post-operative Plan: Extubation in OR  Informed Consent: I have reviewed the patients History and Physical, chart, labs and discussed the procedure including the risks, benefits and alternatives for the proposed anesthesia with the patient or authorized representative who has indicated his/her understanding and acceptance.   Dental advisory given  Plan Discussed with: CRNA  Anesthesia Plan Comments:         Anesthesia Quick Evaluation

## 2018-07-29 ENCOUNTER — Encounter (HOSPITAL_BASED_OUTPATIENT_CLINIC_OR_DEPARTMENT_OTHER): Payer: Self-pay | Admitting: Urology

## 2018-07-29 NOTE — Anesthesia Postprocedure Evaluation (Signed)
Anesthesia Post Note  Patient: Steve Bailey  Procedure(s) Performed: CIRCUMCISION ADULT (N/A Penis)     Patient location during evaluation: PACU Anesthesia Type: General Level of consciousness: sedated and patient cooperative Pain management: pain level controlled Vital Signs Assessment: post-procedure vital signs reviewed and stable Respiratory status: spontaneous breathing Cardiovascular status: stable Anesthetic complications: no    Last Vitals:  Vitals:   07/28/18 1515 07/28/18 1610  BP: (!) 147/80 136/82  Pulse: 72 77  Resp: 17 14  Temp:  36.8 C  SpO2: 97% 98%    Last Pain:  Vitals:   07/28/18 1600  TempSrc:   PainSc: 0-No pain                 Nolon Nations

## 2018-07-30 ENCOUNTER — Other Ambulatory Visit: Payer: Self-pay | Admitting: Endocrinology

## 2018-08-01 ENCOUNTER — Other Ambulatory Visit: Payer: Medicare Other

## 2018-08-04 ENCOUNTER — Ambulatory Visit: Payer: Medicare Other | Admitting: Endocrinology

## 2018-08-16 DIAGNOSIS — N471 Phimosis: Secondary | ICD-10-CM | POA: Diagnosis not present

## 2018-08-16 DIAGNOSIS — C61 Malignant neoplasm of prostate: Secondary | ICD-10-CM | POA: Diagnosis not present

## 2018-08-16 DIAGNOSIS — R3912 Poor urinary stream: Secondary | ICD-10-CM | POA: Diagnosis not present

## 2018-08-24 ENCOUNTER — Ambulatory Visit (INDEPENDENT_AMBULATORY_CARE_PROVIDER_SITE_OTHER): Payer: Medicare Other | Admitting: Cardiovascular Disease

## 2018-08-24 ENCOUNTER — Encounter: Payer: Self-pay | Admitting: Cardiovascular Disease

## 2018-08-24 VITALS — BP 164/80 | HR 83 | Ht 72.0 in | Wt 208.0 lb

## 2018-08-24 DIAGNOSIS — I714 Abdominal aortic aneurysm, without rupture, unspecified: Secondary | ICD-10-CM

## 2018-08-24 DIAGNOSIS — I4821 Permanent atrial fibrillation: Secondary | ICD-10-CM | POA: Diagnosis not present

## 2018-08-24 DIAGNOSIS — E785 Hyperlipidemia, unspecified: Secondary | ICD-10-CM | POA: Diagnosis not present

## 2018-08-24 DIAGNOSIS — I25708 Atherosclerosis of coronary artery bypass graft(s), unspecified, with other forms of angina pectoris: Secondary | ICD-10-CM | POA: Diagnosis not present

## 2018-08-24 DIAGNOSIS — I10 Essential (primary) hypertension: Secondary | ICD-10-CM | POA: Diagnosis not present

## 2018-08-24 DIAGNOSIS — Z23 Encounter for immunization: Secondary | ICD-10-CM | POA: Diagnosis not present

## 2018-08-24 NOTE — Patient Instructions (Addendum)
Your physician wants you to follow-up in:6 months  With Dr.koneswaran You will receive a reminder letter in the mail two months in advance. If you don't receive a letter, please call our office to schedule the follow-up appointment.    Your physician recommends that you continue on your current medications as directed. Please refer to the Current Medication list given to you today.    If you need a refill on your cardiac medications before your next appointment, please call your pharmacy.     No labs or tests today      Thank you for choosing [facility] !

## 2018-08-24 NOTE — Progress Notes (Signed)
SUBJECTIVE: The patient presents for routine follow-up of coronary artery disease. He has a history of coronary artery bypass graft surgery, with his most recent cardiac catheterization performed in 2010. He also has hypertension, hyperlipidemia, IDDM, and atrial fibrillation. He is noted to be noncompliant with his medication regimen.   I reviewed his lipid panel dated 12/28/17: Total cholesterol 139, triglycerides 80, HDL 46, LDL 77.  LDL had been 145 9 months prior.  He denies chest pain.  He seldom has palpitations.  He said he only gets short of breath if he is walking up steep hills particularly when [place] in [facility] which is where he grew up.    Review of Systems: As per "subjective", otherwise negative.  Allergies  Allergen Reactions  . Cephalexin Nausea And Vomiting    Current Outpatient Medications  Medication Sig Dispense Refill  . acetaminophen (TYLENOL) 500 MG tablet Take 1,300 mg by mouth at bedtime.     Marland Kitchen alfuzosin (UROXATRAL) 10 MG 24 hr tablet Take 10 mg by mouth at bedtime. Reported on 12/18/2015    . amLODipine (NORVASC) 10 MG tablet Take 10 mg by mouth every evening.   3  . atorvastatin (LIPITOR) 40 MG tablet TAKE 1 TABLET BY MOUTH EVERY DAY (Patient taking differently: Take 40 mg by mouth every morning. ) 90 tablet 0  . Cinnamon 500 MG capsule Take 500 mg by mouth 2 (two) times daily.    Marland Kitchen ELIQUIS 5 MG TABS tablet TAKE 1 TABLET BY MOUTH TWICE A DAY (Patient taking differently: Take 5 mg by mouth 2 (two) times daily. ) 60 tablet 6  . finasteride (PROSCAR) 5 MG tablet Take 5 mg by mouth every morning. Reported on 12/18/2015    . glucose blood (FREESTYLE LITE) test strip USE AS DIRECTED TWICE A DAY DX CODE:11.65 300 each 1  . HUMULIN 70/30 KWIKPEN (70-30) 100 UNIT/ML PEN INJECT 18 UNITS INTO THE SKIN EVERY MORNING & 22 UNITS AT SUPPER DAILY - NEEDS APPT (Patient taking differently: No sig reported) 15 pen 3  . Insulin Pen Needle (PEN  NEEDLES) 31G X 5 MM MISC 1 each by Does not apply route 2 (two) times daily. 90 each 2  . lisinopril (PRINIVIL,ZESTRIL) 20 MG tablet TAKE 1 TABLET BY MOUTH EVERY DAY 30 tablet 0  . metFORMIN (GLUCOPHAGE-XR) 750 MG 24 hr tablet TAKE 2 TABLETS (1,500 MG TOTAL) BY MOUTH DAILY WITH BREAKFAST. 60 tablet 0  . Omega-3 Fatty Acids (OMEGA-3 FISH OIL PO) Take 1 capsule by mouth daily.    . traMADol (ULTRAM) 50 MG tablet Take 1 tablet (50 mg total) by mouth every 6 (six) hours as needed for moderate pain. Post-operatively 15 tablet 0   No current facility-administered medications for this visit.     Past Medical History:  Diagnosis Date  . AAA (abdominal aortic aneurysm) ([place]) followed by dr Bridgett Larsson   last duplex 06/28/2018 4.6 cm,  asymptomatic  . Anticoagulant long-term use    eliquis  . Benign localized prostatic hyperplasia with lower urinary tract symptoms (LUTS)   . CAD (coronary artery disease) cardiologist-  dr Bronson Ing   remote CABG in 1997; prior PCI to the LAD i n1990; Nuclear study in February of 2012 normal with an EF of 60%   . DDD (degenerative disc disease), cervical   . Full dentures   . History of radiation therapy 11/24/12-01/18/13   Prostate 78Gy/41fx  . HOH (hard of hearing)  both  . Hyperlipidemia   . Hyperlipidemia   . Hypertension   . Permanent atrial fibrillation 09/2012   followed by dr Bronson Ing  . Phimosis    severe  . Proliferative diabetic retinopathy ([place])    both eyes  . Prostate cancer [facility]) urologist-- dr Tresa Moore   dx 09-02-2012-- Stage T2a, Gleason 3+4=7, PSA 8.12, vol 103 cc--- completed external beam radiation 02/ 2014;  recurrent 2015 , started hormone therapy  . S/P CABG x 3 1997  . Strains to urinate   . Type 2 diabetes mellitus treated with insulin [facility])    endocrinologist-- dr Dwyane Dee    Past Surgical History:  Procedure Laterality Date  . CARDIAC CATHETERIZATION  01-18-2004   dr Doreatha Lew   patent grafts, ef 55%, minimal anterior hypokinesis,  severe  total occlusion of LCFx and LAD,  severe disease of D2 and segmental RCA  . CARDIOVASCULAR STRESS TEST  01/07/2011   normal nuclear study w/ no ischemia/  normal LV function and wall motion,  ef 60%  . CARPAL TUNNEL RELEASE Right   . CATARACT EXTRACTION W/ INTRAOCULAR LENS  IMPLANT, BILATERAL  left 2006;  right 2010  . CIRCUMCISION N/A 07/28/2018   Procedure: CIRCUMCISION ADULT;  Surgeon: Alexis Frock, MD;  Location: [facility];  Service: Urology;  Laterality: N/A;  . 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 CORONARY ANGIOPLASTY  1990   LAD  . CORONARY ARTERY BYPASS GRAFT  11-1995   shows distal LAD disease, without recurrent symptoms of angina with questionable mild apical ischemia but with normal EF   . ESOPHAGOGASTRODUODENOSCOPY (EGD) WITH ESOPHAGEAL DILATION  05/2012  . KNEE ARTHROSCOPY Left   . TRANSTHORACIC ECHOCARDIOGRAM  10/10/2012   ef 55%, akinesis of the inferolateral wall,  grade 2 diastolic dysfunction/  trivial AR/  mild MR and TR/  mild LAE/  mild RVSF reduced and mild dilated RV    Social History   Socioeconomic History  . Marital status: Married    Spouse name: Not on file  . Number of children: Not on file  . Years of education: Not on file  . Highest education level: Not on file  Occupational History  . Not on file  Social Needs  . Financial resource strain: Not on file  . Food insecurity:    Worry: Not on file    Inability: Not on file  . Transportation needs:    Medical: Not on file    Non-medical: Not on file  Tobacco Use  . Smoking status: Former Smoker    Years: 20.00    Types: Cigarettes    Last attempt to quit: 10/14/1979    Years since quitting: 38.8  . Smokeless tobacco: Former Systems developer    Types: [place] date: 11/23/2012  Substance and Sexual Activity  . Alcohol use: No  . Drug use: No  . Sexual activity: Not on file  Lifestyle  . Physical activity:    Days per week: Not on file    Minutes per session: Not on file  . Stress:  Not on file  Relationships  . Social connections:    Talks on phone: Not on file    Gets together: Not on file    Attends religious service: Not on file    Active member of club or organization: Not on file    Attends meetings of clubs or organizations: Not on file    Relationship status: Not on file  . Intimate partner violence:  Fear of current or ex partner: Not on file    Emotionally abused: Not on file    Physically abused: Not on file    Forced sexual activity: Not on file  Other Topics Concern  . Not on file  Social History Narrative  . Not on file     Vitals:   08/24/18 1300  BP: (!) 164/80  Pulse: 83  SpO2: 98%  Weight: 208 lb (94.3 kg)  Height: 6' (1.829 m)    Wt Readings from Last 3 Encounters:  08/24/18 208 lb (94.3 kg)  07/28/18 203 lb 12.8 oz (92.4 kg)  06/28/18 200 lb (90.7 kg)     PHYSICAL EXAM General: NAD HEENT: Normal. Neck: No JVD, no thyromegaly. Lungs: Clear to auscultation bilaterally with normal respiratory effort. CV: Regular rate and irregular rhythm, normal S1/S2, no S3, no murmur. No pretibial or periankle edema.  No carotid bruit.   Abdomen: Soft, nontender, no distention.  Neurologic: Alert and oriented.  Psych: Normal affect. Skin: Normal. Musculoskeletal: No gross deformities.    ECG: Reviewed above under Subjective   Labs: Lab Results  Component Value Date/Time   K 4.0 07/28/2018 12:42 PM   BUN 19 07/28/2018 12:42 PM   BUN 23 09/11/2016 04:45 PM   CREATININE 1.10 07/28/2018 12:42 PM   ALT 10 03/28/2018 09:43 AM   TSH 3.90 03/28/2018 09:43 AM   HGB 10.9 (L) 07/28/2018 12:42 PM     Lipids: Lab Results  Component Value Date/Time   LDLCALC 77 12/28/2017 10:11 AM   CHOL 139 12/28/2017 10:11 AM   TRIG 80.0 12/28/2017 10:11 AM   HDL 46.10 12/28/2017 10:11 AM       ASSESSMENT AND PLAN:  1. CAD s/p CABG: Symptomatically stable. Continue Lipitor.  Not on beta-blockers.  As he is on Eliquis, I am avoiding  ASA.  2. Essential ZTI:WPYKD pressure is elevated.  He is on amlodipine 10 mg and lisinopril 20 mg.  He told me systolic readings are generally between 130-140.  No changes to therapy today.  3. Hyperlipidemia: I reviewed his lipid panel dated 12/28/17 above.  LDL had been 145 nine months prior.Continue Lipitor 40 mg.  4. Permanent atrial fibrillation: Heart rate is controlled without AV nodal blocking agents indicative of conduction system disease. Anticoagulated with Eliquis. No changes.  5. AAA: Largest diameter 4.5 cm on 07/01/18. Followed by vascular surgery.    Disposition: Follow up 6 months   Kate Sable, M.D., F.A.C.C.

## 2018-08-30 ENCOUNTER — Other Ambulatory Visit (INDEPENDENT_AMBULATORY_CARE_PROVIDER_SITE_OTHER): Payer: Medicare Other

## 2018-08-30 DIAGNOSIS — Z794 Long term (current) use of insulin: Secondary | ICD-10-CM

## 2018-08-30 DIAGNOSIS — R7989 Other specified abnormal findings of blood chemistry: Secondary | ICD-10-CM | POA: Diagnosis not present

## 2018-08-30 DIAGNOSIS — E1165 Type 2 diabetes mellitus with hyperglycemia: Secondary | ICD-10-CM | POA: Diagnosis not present

## 2018-08-30 LAB — BASIC METABOLIC PANEL
BUN: 25 mg/dL — ABNORMAL HIGH (ref 6–23)
CO2: 25 mEq/L (ref 19–32)
Calcium: 9.2 mg/dL (ref 8.4–10.5)
Chloride: 104 mEq/L (ref 96–112)
Creatinine, Ser: 1.27 mg/dL (ref 0.40–1.50)
GFR: 57.66 mL/min — ABNORMAL LOW (ref >60.00)
Glucose, Bld: 203 mg/dL — ABNORMAL HIGH (ref 70–99)
Potassium: 4.4 mEq/L (ref 3.5–5.1)
Sodium: 137 mEq/L (ref 135–145)

## 2018-08-30 LAB — HEMOGLOBIN A1C: Hgb A1c MFr Bld: 6.7 % — ABNORMAL HIGH (ref 4.6–6.5)

## 2018-08-30 LAB — TSH: TSH: 3.02 u[IU]/mL (ref 0.35–4.50)

## 2018-09-01 ENCOUNTER — Ambulatory Visit (INDEPENDENT_AMBULATORY_CARE_PROVIDER_SITE_OTHER): Payer: Medicare Other | Admitting: Endocrinology

## 2018-09-01 ENCOUNTER — Encounter: Payer: Self-pay | Admitting: Endocrinology

## 2018-09-01 VITALS — BP 138/72 | HR 96 | Ht 74.0 in | Wt 204.0 lb

## 2018-09-01 DIAGNOSIS — I10 Essential (primary) hypertension: Secondary | ICD-10-CM | POA: Diagnosis not present

## 2018-09-01 DIAGNOSIS — E1165 Type 2 diabetes mellitus with hyperglycemia: Secondary | ICD-10-CM | POA: Diagnosis not present

## 2018-09-01 DIAGNOSIS — I25708 Atherosclerosis of coronary artery bypass graft(s), unspecified, with other forms of angina pectoris: Secondary | ICD-10-CM

## 2018-09-01 DIAGNOSIS — Z794 Long term (current) use of insulin: Secondary | ICD-10-CM

## 2018-09-01 DIAGNOSIS — E538 Deficiency of other specified B group vitamins: Secondary | ICD-10-CM

## 2018-09-01 NOTE — Patient Instructions (Signed)
MUST TAKE SHOT BEFORE MEALS

## 2018-09-01 NOTE — Progress Notes (Signed)
Patient ID: Steve Bailey, male   DOB: Apr 06, 1934, 84 y.o.   MRN: [id number]    Reason for Appointment:  follow-up of various problems  History of Present Illness    Diagnosis: Type 2 DIABETES MELITUS, date of diagnosis: 1995              History:     Insulin regimen: Humulin 70/30,  20 units  a.c. twice a day with pens      Oral hypoglycemic drugs: Metformin ER 1500 mg daily   His A1c has been consistently under 7% and now 6.7   Current management, blood sugar patterns and problems:  He was recommended taking his insulin before eating but he is still taking it after the meal  He says that because of taking care of his wife he has not been active at all as usual  His weight is slightly higher  At times he will be eating some fast food and lab glucose was over 200 after lunch  Otherwise he thinks he is avoiding drinks with sugar and not a lot of fast food  As before he does not bring his monitor for download and not clear how often he is checking his sugars  Previously was taking Actos for insulin resistance, he stopped taking this because of fear of side effects   Glucometer: ?  Freestyle  Blood sugar readings by recall:    115 -130, frequency of monitoring unknown  Carbohydrate intake: at meals: Mostly with vegetables, also getting some lean meats like Chicken and pork Egg, bacon, grits in am   Physical activity: Generally active with farming activities, less now  Weight history:  Wt Readings from Last 3 Encounters:  09/01/18 204 lb (92.5 kg)  08/24/18 208 lb (94.3 kg)  07/28/18 203 lb 12.8 oz (92.4 kg)      Lab Results  Component Value Date   HGBA1C 6.7 (H) 08/30/2018   HGBA1C 6.8 (H) 03/28/2018   HGBA1C 6.3 12/31/2017   Lab Results  Component Value Date   MICROALBUR 14.1 (H) 12/28/2017   LDLCALC 77 12/28/2017   CREATININE 1.27 08/30/2018    Other active problems: See review of systems    Allergies as of 09/01/2018      Reactions    Cephalexin Nausea And Vomiting      Medication List        Accurate as of 09/01/18  3:52 PM. Always use your most recent med list.          acetaminophen 500 MG tablet Commonly known as:  TYLENOL Take 1,300 mg by mouth at bedtime.   alfuzosin 10 MG 24 hr tablet Commonly known as:  UROXATRAL Take 10 mg by mouth at bedtime. Reported on 12/18/2015   amLODipine 10 MG tablet Commonly known as:  NORVASC Take 10 mg by mouth every evening.   atorvastatin 40 MG tablet Commonly known as:  LIPITOR TAKE 1 TABLET BY MOUTH EVERY DAY   Cinnamon 500 MG capsule Take 500 mg by mouth 2 (two) times daily.   ELIQUIS 5 MG Tabs tablet Generic drug:  apixaban TAKE 1 TABLET BY MOUTH TWICE A DAY   finasteride 5 MG tablet Commonly known as:  PROSCAR Take 5 mg by mouth every morning. Reported on 12/18/2015   glucose blood test strip USE AS DIRECTED TWICE A DAY DX CODE:11.65   HUMULIN 70/30 KWIKPEN (70-30) 100 UNIT/ML PEN Generic drug:  Insulin Isophane & Regular Human INJECT 18 UNITS INTO THE SKIN  EVERY MORNING & 22 UNITS AT SUPPER DAILY - NEEDS APPT   lisinopril 20 MG tablet Commonly known as:  PRINIVIL,ZESTRIL TAKE 1 TABLET BY MOUTH EVERY DAY   metFORMIN 750 MG 24 hr tablet Commonly known as:  GLUCOPHAGE-XR TAKE 2 TABLETS (1,500 MG TOTAL) BY MOUTH DAILY WITH BREAKFAST.   OMEGA-3 FISH OIL PO Take 1 capsule by mouth daily.   Pen Needles 31G X 5 MM Misc 1 each by Does not apply route 2 (two) times daily.   traMADol 50 MG tablet Commonly known as:  ULTRAM Take 1 tablet (50 mg total) by mouth every 6 (six) hours as needed for moderate pain. Post-operatively       Allergies:  Allergies  Allergen Reactions  . Cephalexin Nausea And Vomiting    Past Medical History:  Diagnosis Date  . AAA (abdominal aortic aneurysm) ([place]) followed by dr Bridgett Larsson   last duplex 06/28/2018 4.6 cm,  asymptomatic  . Anticoagulant long-term use    eliquis  . Benign localized prostatic hyperplasia with  lower urinary tract symptoms (LUTS)   . CAD (coronary artery disease) cardiologist-  dr Bronson Ing   remote CABG in 1997; prior PCI to the LAD i n1990; Nuclear study in February of 2012 normal with an EF of 60%   . DDD (degenerative disc disease), cervical   . Full dentures   . History of radiation therapy 11/24/12-01/18/13   Prostate 78Gy/30fx  . HOH (hard of hearing)    both  . Hyperlipidemia   . Hyperlipidemia   . Hypertension   . Permanent atrial fibrillation 09/2012   followed by dr Bronson Ing  . Phimosis    severe  . Proliferative diabetic retinopathy ([place])    both eyes  . Prostate cancer [facility]) urologist-- dr Tresa Moore   dx 09-02-2012-- Stage T2a, Gleason 3+4=7, PSA 8.12, vol 103 cc--- completed external beam radiation 02/ 2014;  recurrent 2015 , started hormone therapy  . S/P CABG x 3 1997  . Strains to urinate   . Type 2 diabetes mellitus treated with insulin [facility])    endocrinologist-- dr Dwyane Dee    Past Surgical History:  Procedure Laterality Date  . CARDIAC CATHETERIZATION  01-18-2004   dr Doreatha Lew   patent grafts, ef 55%, minimal anterior hypokinesis,  severe total occlusion of LCFx and LAD,  severe disease of D2 and segmental RCA  . CARDIOVASCULAR STRESS TEST  01/07/2011   normal nuclear study w/ no ischemia/  normal LV function and wall motion,  ef 60%  . CARPAL TUNNEL RELEASE Right   . CATARACT EXTRACTION W/ INTRAOCULAR LENS  IMPLANT, BILATERAL  left 2006;  right 2010  . CIRCUMCISION N/A 07/28/2018   Procedure: CIRCUMCISION ADULT;  Surgeon: Alexis Frock, MD;  Location: [facility];  Service: Urology;  Laterality: N/A;  . 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 CORONARY ANGIOPLASTY  1990   LAD  . CORONARY ARTERY BYPASS GRAFT  11-1995   shows distal LAD disease, without recurrent symptoms of angina with questionable mild apical ischemia but with normal EF   . ESOPHAGOGASTRODUODENOSCOPY (EGD) WITH ESOPHAGEAL DILATION  05/2012  . KNEE ARTHROSCOPY Left   .  TRANSTHORACIC ECHOCARDIOGRAM  10/10/2012   ef 55%, akinesis of the inferolateral wall,  grade 2 diastolic dysfunction/  trivial AR/  mild MR and TR/  mild LAE/  mild RVSF reduced and mild dilated RV    Family History  Problem Relation Age of Onset  . Stroke Father   . Heart attack Mother   .  Heart disease Mother   . Cancer Sister        lung  . Cancer Brother        prostate  . Diabetes Brother   . Stroke Brother   . Stroke Brother   . Heart disease Brother   . Heart disease Brother   . Heart attack Sister   . Heart disease Sister   . Heart attack Sister   . Cancer Sister        breast  . Heart disease Sister   . Heart attack Sister   . Heart disease Sister   . Heart disease Sister     Social History:  reports that he quit smoking about 38 years ago. His smoking use included cigarettes. He quit after 20.00 years of use. He quit smokeless tobacco use about 5 years ago.  His smokeless tobacco use included chew. He reports that he does not drink alcohol or use drugs.  ROS   HYPERTENSION:   Has been present for several years.    He has been prescribed lisinopril 20 mg, amlodipine 5 mg and HCTZ   Blood pressure    He thinks his blood pressure at home is < 267 systolic usually  BP Readings from Last 3 Encounters:  09/01/18 (!) 158/86  08/24/18 (!) 164/80  07/28/18 136/82    Eye exam done annually at [facility] [facility]:    he has had high LDL and this has been fairly well-controlled with 40 mg Lipitor Labs as follows:   Lab Results  Component Value Date   CHOL 139 12/28/2017   HDL 46.10 12/28/2017   LDLCALC 77 12/28/2017   TRIG 80.0 12/28/2017   CHOLHDL 3 12/28/2017    Diabetic foot exam done in 10/18 showed normal monofilament sensation and absent pulses  He also is being followed by vascular surgeon for aortic aneurysm    Examination:   BP (!) 158/86   Pulse 96   Ht 6\' 2"  (1.88 m)   Wt 204 lb (92.5 kg)   SpO2 96%   BMI 26.19 kg/m    Body mass index is 26.19 kg/m.    Assesment/PLAN:  DIABETES, Type II on insulin:  See history of present illness for  discussion of current diabetes management and problems identified  His A1c is stable at 6.7  This is with metformin and premixed insulin twice a day Has minimal monitoring at home and does not bring his monitor for download Discussed that since he tends to have high readings with high fat meals he will need to try and avoid fast food better Also to check blood sugars more often after meals He will make sure he takes his insulin before eating instead of after   HYPERTENSION: Blood pressure is relatively high but better on repeat testing and he tends to have some drop in blood pressure on standing Although he is not taking HCTZ currently as directed we will not restart as yet  He needs to have a PCP for general care    Elayne Snare 09/01/2018, 3:52 PM

## 2018-09-05 ENCOUNTER — Other Ambulatory Visit: Payer: Self-pay | Admitting: Cardiovascular Disease

## 2018-09-06 ENCOUNTER — Other Ambulatory Visit: Payer: Self-pay | Admitting: Cardiovascular Disease

## 2018-09-15 ENCOUNTER — Other Ambulatory Visit: Payer: Self-pay

## 2018-09-15 MED ORDER — INSULIN ISOPHANE & REGULAR (HUMAN 70-30)100 UNIT/ML KWIKPEN: PEN_INJECTOR | SUBCUTANEOUS | 3 refills | Status: DC

## 2018-09-23 ENCOUNTER — Other Ambulatory Visit: Payer: Self-pay | Admitting: Endocrinology

## 2018-10-03 ENCOUNTER — Other Ambulatory Visit: Payer: Self-pay | Admitting: Cardiovascular Disease

## 2018-10-28 ENCOUNTER — Other Ambulatory Visit: Payer: Self-pay | Admitting: Endocrinology

## 2018-12-01 ENCOUNTER — Other Ambulatory Visit: Payer: Self-pay | Admitting: Endocrinology

## 2018-12-19 DIAGNOSIS — Z7901 Long term (current) use of anticoagulants: Secondary | ICD-10-CM | POA: Diagnosis not present

## 2018-12-19 DIAGNOSIS — H1851 Endothelial corneal dystrophy: Secondary | ICD-10-CM | POA: Diagnosis not present

## 2018-12-19 DIAGNOSIS — Z947 Corneal transplant status: Secondary | ICD-10-CM | POA: Diagnosis not present

## 2018-12-19 DIAGNOSIS — Z961 Presence of intraocular lens: Secondary | ICD-10-CM | POA: Diagnosis not present

## 2018-12-29 ENCOUNTER — Other Ambulatory Visit: Payer: Medicare Other

## 2018-12-30 ENCOUNTER — Other Ambulatory Visit (INDEPENDENT_AMBULATORY_CARE_PROVIDER_SITE_OTHER): Payer: Medicare Other

## 2018-12-30 DIAGNOSIS — E538 Deficiency of other specified B group vitamins: Secondary | ICD-10-CM | POA: Diagnosis not present

## 2018-12-30 DIAGNOSIS — Z794 Long term (current) use of insulin: Secondary | ICD-10-CM

## 2018-12-30 DIAGNOSIS — E1165 Type 2 diabetes mellitus with hyperglycemia: Secondary | ICD-10-CM

## 2018-12-30 LAB — CBC
HCT: 37 % — ABNORMAL LOW (ref 39.0–52.0)
Hemoglobin: 12.8 g/dL — ABNORMAL LOW (ref 13.0–17.0)
MCHC: 34.5 g/dL (ref 30.0–36.0)
MCV: 95.4 fl (ref 78.0–100.0)
Platelets: 181 10*3/uL (ref 150.0–400.0)
RBC: 3.88 Mil/uL — ABNORMAL LOW (ref 4.22–5.81)
RDW: 14.2 % (ref 11.5–15.5)
WBC: 4.4 10*3/uL (ref 4.0–10.5)

## 2018-12-30 LAB — COMPREHENSIVE METABOLIC PANEL
ALT: 22 U/L (ref 0–53)
AST: 15 U/L (ref 0–37)
Albumin: 4 g/dL (ref 3.5–5.2)
Alkaline Phosphatase: 92 U/L (ref 39–117)
BUN: 21 mg/dL (ref 6–23)
CO2: 26 mEq/L (ref 19–32)
Calcium: 9 mg/dL (ref 8.4–10.5)
Chloride: 105 mEq/L (ref 96–112)
Creatinine, Ser: 1.22 mg/dL (ref 0.40–1.50)
GFR: 56.78 mL/min — ABNORMAL LOW (ref >60.00)
Glucose, Bld: 219 mg/dL — ABNORMAL HIGH (ref 70–99)
Potassium: 4.6 mEq/L (ref 3.5–5.1)
Sodium: 141 mEq/L (ref 135–145)
Total Bilirubin: 0.8 mg/dL (ref 0.2–1.2)
Total Protein: 6.3 g/dL (ref 6.0–8.3)

## 2018-12-30 LAB — MICROALBUMIN / CREATININE URINE RATIO
Creatinine,U: 193.7 mg/dL
Microalb Creat Ratio: 7.7 mg/g (ref 0.0–30.0)
Microalb, Ur: 15 mg/dL — ABNORMAL HIGH (ref 0.0–1.9)

## 2018-12-30 LAB — LIPID PANEL
Cholesterol: 159 mg/dL (ref 0–200)
HDL: 48.3 mg/dL (ref >39.00)
LDL Cholesterol: 94 mg/dL (ref 0–99)
NonHDL: 111.17
Total CHOL/HDL Ratio: 3
Triglycerides: 87 mg/dL (ref 0.0–149.0)
VLDL: 17.4 mg/dL (ref 0.0–40.0)

## 2018-12-30 LAB — VITAMIN B12: Vitamin B-12: 834 pg/mL (ref 211–911)

## 2018-12-30 LAB — HEMOGLOBIN A1C: Hgb A1c MFr Bld: 7 % — ABNORMAL HIGH (ref 4.6–6.5)

## 2019-01-03 ENCOUNTER — Ambulatory Visit (INDEPENDENT_AMBULATORY_CARE_PROVIDER_SITE_OTHER): Payer: Medicare Other | Admitting: Physician Assistant

## 2019-01-03 ENCOUNTER — Other Ambulatory Visit: Payer: Self-pay

## 2019-01-03 ENCOUNTER — Ambulatory Visit (HOSPITAL_COMMUNITY)
Admission: RE | Admit: 2019-01-03 | Discharge: 2019-01-03 | Disposition: A | Discharge status: Home / Self Care | Payer: Medicare Other | Source: Ambulatory Visit | Attending: Vascular Surgery | Admitting: Vascular Surgery

## 2019-01-03 ENCOUNTER — Encounter: Payer: Self-pay | Admitting: Family

## 2019-01-03 ENCOUNTER — Ambulatory Visit (INDEPENDENT_AMBULATORY_CARE_PROVIDER_SITE_OTHER)
Admission: RE | Admit: 2019-01-03 | Discharge: 2019-01-03 | Disposition: A | Discharge status: Home / Self Care | Payer: Medicare Other | Source: Ambulatory Visit | Attending: Vascular Surgery | Admitting: Vascular Surgery

## 2019-01-03 VITALS — BP 166/92 | HR 78 | Temp 97.9°F | Resp 16 | Ht 72.0 in | Wt 200.0 lb

## 2019-01-03 DIAGNOSIS — R0989 Other specified symptoms and signs involving the circulatory and respiratory systems: Secondary | ICD-10-CM | POA: Diagnosis not present

## 2019-01-03 DIAGNOSIS — I10 Essential (primary) hypertension: Secondary | ICD-10-CM | POA: Diagnosis not present

## 2019-01-03 DIAGNOSIS — I714 Abdominal aortic aneurysm, without rupture, unspecified: Secondary | ICD-10-CM

## 2019-01-03 NOTE — Progress Notes (Signed)
Established Abdominal Aortic Aneurysm   History of Present Illness   Steve Bailey is a 85 y.o. ([birth year]) male who presents with chief complaint: follow up for AAA.  Previous studies demonstrate an AAA, measuring 4.6 cm by ultrasound.  The patient denies any new or changing abdominal or back pain.  Hypertension is managed by his cardiologist.  Patient is able to check his blood pressure at home and states it is controlled with a systolic of around 149.  He denies tobacco use.  He takes a statin daily.  He is on Eliquis for atrial fibrillation.  Today he was also scanned for popliteal aneurysms.  A prominent left popliteal pulse was felt that last office visit.  He denies any tissue changes bilateral lower extremities.  The patient's PMH, PSH, SH, and FamHx were reviewed and are unchanged from prior visit.  Current Outpatient Medications  Medication Sig Dispense Refill  . acetaminophen (TYLENOL) 500 MG tablet Take 1,300 mg by mouth at bedtime.     Marland Kitchen alfuzosin (UROXATRAL) 10 MG 24 hr tablet Take 10 mg by mouth at bedtime. Reported on 12/18/2015    . amLODipine (NORVASC) 10 MG tablet TAKE 1 TABLET (10 MG TOTAL) BY MOUTH DAILY. 90 tablet 1  . atorvastatin (LIPITOR) 40 MG tablet TAKE 1 TABLET BY MOUTH EVERY DAY 90 tablet 3  . Cinnamon 500 MG capsule Take 500 mg by mouth 2 (two) times daily.    Marland Kitchen ELIQUIS 5 MG TABS tablet TAKE 1 TABLET BY MOUTH TWICE A DAY 180 tablet 2  . finasteride (PROSCAR) 5 MG tablet Take 5 mg by mouth every morning. Reported on 12/18/2015    . glucose blood (FREESTYLE LITE) test strip USE AS DIRECTED TWICE A DAY DX CODE:11.65 300 each 1  . Insulin Isophane & Regular Human (HUMULIN 70/30 KWIKPEN) (70-30) 100 UNIT/ML PEN INJECT 18 UNITS INTO THE SKIN EVERY MORNING & 22 UNITS AT SUPPER DAILY - NEEDS APPT 15 pen 3  . Insulin Pen Needle (PEN NEEDLES) 31G X 5 MM MISC 1 each by Does not apply route 2 (two) times daily. 90 each 2  . lisinopril (PRINIVIL,ZESTRIL) 20 MG tablet TAKE 1  TABLET BY MOUTH EVERY DAY 30 tablet 3  . metFORMIN (GLUCOPHAGE-XR) 750 MG 24 hr tablet TAKE 2 TABLETS (1,500 MG TOTAL) BY MOUTH DAILY WITH BREAKFAST. 60 tablet 3  . Omega-3 Fatty Acids (OMEGA-3 FISH OIL PO) Take 1 capsule by mouth daily.    . traMADol (ULTRAM) 50 MG tablet Take 1 tablet (50 mg total) by mouth every 6 (six) hours as needed for moderate pain. Post-operatively (Patient not taking: Reported on 01/03/2019) 15 tablet 0   No current facility-administered medications for this visit.     On ROS today: 10 system ROS is negative unless otherwise noted in HPI   Physical Examination   Vitals:   01/03/19 0904  BP: (!) 166/92  Pulse: 78  Resp: 16  Temp: 97.9 F (36.6 C)  TempSrc: Oral  SpO2: 99%  Weight: 200 lb (90.7 kg)  Height: 6' (1.829 m)   Body mass index is 27.12 kg/m.  General Alert, O x 3, WD, NAD  Pulmonary Sym exp, good B air movt  Cardiac RRR, Nl S1, S2,  Vascular Vessel Right Left  Radial Palpable Palpable  Aorta Not palpable N/A  Femoral Palpable Palpable  Popliteal Not palpable Not palpable  PT Faintly palpable Faintly palpable  DP Not palpable Not palpable    Gastro- intestinal soft, non-distended, non-tender  to palpation, , No palpable prominent aortic pulse  Musculo- skeletal M/S 5/5 throughout  , Extremities without ischemic changes  , No pain to palpation of left popliteal cyst  Neurologic A&O; CN grossly intact     Non-Invasive Vascular Imaging   AAA Duplex (01/03/19)  Current size: 4.6 cm  Previous size: 4.6 cm (06/2018)  R CIA: 1.1 cm  L CIA: 1.2 cm  Incidental finding of popliteal cyst left lower extremity   Medical Decision Making   Steve Bailey is a 85 y.o. (1934/09/06) male who presents with: asymptomatic AAA measuring 4.6cm by ultrasound   4.6 cm AAA stable in size compared to ultrasound 6 months prior  No indication for repair of AAA at this time  Encouraged to follow-up with cardiologist for control of  hypertension  Patient and daughter would feel comfortable following up every 6 months for AAA surveillance   Dagoberto Ligas PA-C [facility] Office: [phone]  Clinic MD: Dr. Carlis Abbott

## 2019-01-04 ENCOUNTER — Encounter: Payer: Self-pay | Admitting: Endocrinology

## 2019-01-04 ENCOUNTER — Ambulatory Visit (INDEPENDENT_AMBULATORY_CARE_PROVIDER_SITE_OTHER): Payer: Medicare Other | Admitting: Endocrinology

## 2019-01-04 ENCOUNTER — Telehealth: Payer: Self-pay | Admitting: Cardiovascular Disease

## 2019-01-04 VITALS — BP 154/80 | HR 95 | Ht 72.0 in | Wt 213.0 lb

## 2019-01-04 DIAGNOSIS — I10 Essential (primary) hypertension: Secondary | ICD-10-CM

## 2019-01-04 DIAGNOSIS — Z794 Long term (current) use of insulin: Secondary | ICD-10-CM | POA: Diagnosis not present

## 2019-01-04 DIAGNOSIS — E1165 Type 2 diabetes mellitus with hyperglycemia: Secondary | ICD-10-CM

## 2019-01-04 DIAGNOSIS — E7800 Pure hypercholesterolemia, unspecified: Secondary | ICD-10-CM

## 2019-01-04 LAB — GLUCOSE, POCT (MANUAL RESULT ENTRY): POC Glucose: 287 mg/dl — AB (ref 70–99)

## 2019-01-04 MED ORDER — METOPROLOL SUCCINATE ER 25 MG PO TB24: 25.0000 mg | ORAL_TABLET | Freq: Every day | ORAL | 3 refills | Status: DC

## 2019-01-04 MED ORDER — METFORMIN HCL ER 750 MG PO TB24: 1500.0000 mg | ORAL_TABLET | Freq: Every day | ORAL | 3 refills | Status: DC

## 2019-01-04 NOTE — Telephone Encounter (Signed)
Samples placed in front office  

## 2019-01-04 NOTE — Patient Instructions (Addendum)
Increase insulin to 22 units before meals   Check blood sugars on waking up 3-4 days a week  Also check blood sugars about 2 hours after meals and do this after different meals by rotation  Recommended blood sugar levels on waking up are 90-130 and about 2 hours after meal is 130-160  Please bring your blood sugar monitor to each visit, thank you

## 2019-01-04 NOTE — Progress Notes (Signed)
Patient ID: Steve Bailey, male   DOB: 31-Mar-1934, 85 y.o.   MRN: [id number]    Reason for Appointment:  follow-up of various problems  History of Present Illness    Diagnosis: Type 2 DIABETES MELITUS, date of diagnosis: 1995              History:     Insulin regimen: Humulin 70/30,  20 units  a.c. twice a day with pens      Oral hypoglycemic drugs: Metformin ER 1500 mg daily   His A1c has been consistently under 7% and now 7  Current management, blood sugar patterns and problems:  He still has not checked his blood sugars  He now says that his meter is not working and he has not checked his battery, also his meter is a few years old  It is unlikely that he is taking his insulin or medications regularly because his blood sugars appear to be over 200 whenever they are checked, not clear if his lab glucose previously was before after eating  Blood sugars are higher than expected for his A1c  Today he did not take his insulin before eating  Also has run out of his metformin, reportedly a week ago  His weight has gone up since his last visit  Previously was taking Actos for insulin resistance, he stopped taking this because of fear of side effects   Glucometer: ?  Freestyle  Blood sugar readings not available     Carbohydrate intake: at meals: Mostly with vegetables, also getting some lean meats like Chicken and pork Egg, bacon, grits in am   Physical activity: Generally active with farming activities, less now  Weight history:  Wt Readings from Last 3 Encounters:  01/04/19 213 lb (96.6 kg)  01/03/19 200 lb (90.7 kg)  09/01/18 204 lb (92.5 kg)      Lab Results  Component Value Date   HGBA1C 7.0 (H) 12/30/2018   HGBA1C 6.7 (H) 08/30/2018   HGBA1C 6.8 (H) 03/28/2018   Lab Results  Component Value Date   MICROALBUR 15.0 (H) 12/30/2018   LDLCALC 94 12/30/2018   CREATININE 1.22 12/30/2018    Other active problems: See review of  systems    Allergies as of 01/04/2019      Reactions   Cephalexin Nausea And Vomiting      Medication List       Accurate as of January 04, 2019  9:17 AM. Always use your most recent med list.        acetaminophen 500 MG tablet Commonly known as:  TYLENOL Take 1,300 mg by mouth at bedtime.   alfuzosin 10 MG 24 hr tablet Commonly known as:  UROXATRAL Take 10 mg by mouth at bedtime. Reported on 12/18/2015   amLODipine 10 MG tablet Commonly known as:  NORVASC TAKE 1 TABLET (10 MG TOTAL) BY MOUTH DAILY.   atorvastatin 40 MG tablet Commonly known as:  LIPITOR TAKE 1 TABLET BY MOUTH EVERY DAY   Cinnamon 500 MG capsule Take 500 mg by mouth 2 (two) times daily.   ELIQUIS 5 MG Tabs tablet Generic drug:  apixaban TAKE 1 TABLET BY MOUTH TWICE A DAY   finasteride 5 MG tablet Commonly known as:  PROSCAR Take 5 mg by mouth every morning. Reported on 12/18/2015   glucose blood test strip Commonly known as:  FREESTYLE LITE USE AS DIRECTED TWICE A DAY DX CODE:11.65   Insulin Isophane & Regular Human (70-30) 100 UNIT/ML PEN  Commonly known as:  HUMULIN 70/30 KWIKPEN INJECT 18 UNITS INTO THE SKIN EVERY MORNING & 22 UNITS AT SUPPER DAILY - NEEDS APPT   lisinopril 20 MG tablet Commonly known as:  PRINIVIL,ZESTRIL TAKE 1 TABLET BY MOUTH EVERY DAY   metFORMIN 750 MG 24 hr tablet Commonly known as:  GLUCOPHAGE-XR TAKE 2 TABLETS (1,500 MG TOTAL) BY MOUTH DAILY WITH BREAKFAST.   OMEGA-3 FISH OIL PO Take 1 capsule by mouth daily.   Pen Needles 31G X 5 MM Misc 1 each by Does not apply route 2 (two) times daily.   traMADol 50 MG tablet Commonly known as:  ULTRAM Take 1 tablet (50 mg total) by mouth every 6 (six) hours as needed for moderate pain. Post-operatively       Allergies:  Allergies  Allergen Reactions  . Cephalexin Nausea And Vomiting    Past Medical History:  Diagnosis Date  . AAA (abdominal aortic aneurysm) ([place]) followed by dr Bridgett Larsson   last duplex 06/28/2018  4.6 cm,  asymptomatic  . Anticoagulant long-term use    eliquis  . Benign localized prostatic hyperplasia with lower urinary tract symptoms (LUTS)   . CAD (coronary artery disease) cardiologist-  dr Bronson Ing   remote CABG in 1997; prior PCI to the LAD i n1990; Nuclear study in February of 2012 normal with an EF of 60%   . DDD (degenerative disc disease), cervical   . Full dentures   . History of radiation therapy 11/24/12-01/18/13   Prostate 78Gy/53fx  . HOH (hard of hearing)    both  . Hyperlipidemia   . Hyperlipidemia   . Hypertension   . Permanent atrial fibrillation 09/2012   followed by dr Bronson Ing  . Phimosis    severe  . Proliferative diabetic retinopathy ([place])    both eyes  . Prostate cancer [facility]) urologist-- dr Tresa Moore   dx 09-02-2012-- Stage T2a, Gleason 3+4=7, PSA 8.12, vol 103 cc--- completed external beam radiation 02/ 2014;  recurrent 2015 , started hormone therapy  . S/P CABG x 3 1997  . Strains to urinate   . Type 2 diabetes mellitus treated with insulin [facility])    endocrinologist-- dr Dwyane Dee    Past Surgical History:  Procedure Laterality Date  . CARDIAC CATHETERIZATION  01-18-2004   dr Doreatha Lew   patent grafts, ef 55%, minimal anterior hypokinesis,  severe total occlusion of LCFx and LAD,  severe disease of D2 and segmental RCA  . CARDIOVASCULAR STRESS TEST  01/07/2011   normal nuclear study w/ no ischemia/  normal LV function and wall motion,  ef 60%  . CARPAL TUNNEL RELEASE Right   . CATARACT EXTRACTION W/ INTRAOCULAR LENS  IMPLANT, BILATERAL  left 2006;  right 2010  . CIRCUMCISION N/A 07/28/2018   Procedure: CIRCUMCISION ADULT;  Surgeon: Alexis Frock, MD;  Location: [facility];  Service: Urology;  Laterality: N/A;  . 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 CORONARY ANGIOPLASTY  1990   LAD  . CORONARY ARTERY BYPASS GRAFT  11-1995   shows distal LAD disease, without recurrent symptoms of angina with questionable mild apical ischemia but with  normal EF   . ESOPHAGOGASTRODUODENOSCOPY (EGD) WITH ESOPHAGEAL DILATION  05/2012  . KNEE ARTHROSCOPY Left   . TRANSTHORACIC ECHOCARDIOGRAM  10/10/2012   ef 55%, akinesis of the inferolateral wall,  grade 2 diastolic dysfunction/  trivial AR/  mild MR and TR/  mild LAE/  mild RVSF reduced and mild dilated RV    Family History  Problem Relation Age  of Onset  . Stroke Father   . Heart attack Mother   . Heart disease Mother   . Cancer Sister        lung  . Cancer Brother        prostate  . Diabetes Brother   . Stroke Brother   . Stroke Brother   . Heart disease Brother   . Heart disease Brother   . Heart attack Sister   . Heart disease Sister   . Heart attack Sister   . Cancer Sister        breast  . Heart disease Sister   . Heart attack Sister   . Heart disease Sister   . Heart disease Sister     Social History:  reports that he quit smoking about 39 years ago. His smoking use included cigarettes. He quit after 20.00 years of use. He quit smokeless tobacco use about 6 years ago.  His smokeless tobacco use included chew. He reports that he does not drink alcohol or use drugs.  ROS   HYPERTENSION:   Has been present for several years.    He has been treated by cardiologist with lisinopril 20 mg, amlodipine 5 mg, do not see HCTZ listed which was previously used No changes made with his last cardiology visit   BP Readings from Last 3 Encounters:  01/04/19 (!) 154/80  01/03/19 (!) 166/92  09/01/18 138/72           HYPERLIPIDEMIA:    he has had high LDL and this has been fairly well-controlled with 40 mg Lipitor Labs as follows:   Lab Results  Component Value Date   CHOL 159 12/30/2018   HDL 48.30 12/30/2018   LDLCALC 94 12/30/2018   TRIG 87.0 12/30/2018   CHOLHDL 3 12/30/2018    Diabetic foot exam done in 2/20 showed normal monofilament sensation and absent pulses  He is being followed by vascular surgeon for aortic aneurysm  He is being followed regularly by  [facility] ophthalmology, has corneal dystrophy    Examination:   BP (!) 154/80 (BP Location: Left Arm, Patient Position: Sitting, Cuff Size: Normal)   Pulse 95   Ht 6' (1.829 m)   Wt 213 lb (96.6 kg)   SpO2 95%   BMI 28.89 kg/m   Body mass index is 28.89 kg/m.   Diabetic Foot Exam - Simple   Simple Foot Form Diabetic Foot exam was performed with the following findings:  Yes 01/04/2019  9:30 AM  Visual Inspection No deformities, no ulcerations, no other skin breakdown bilaterally:  Yes See comments:  Yes Sensation Testing Intact to touch and monofilament testing bilaterally:  Yes Pulse Check See comments:  Yes Comments Absent pulses. calluses      Assesment/PLAN:  DIABETES, Type II on insulin:  See history of present illness for  discussion of current diabetes management and problems identified  His A1c is 7%  However his blood sugars in the office and lab are over 200 and not clear why they are inconsistent He may not be taking his insulin and metformin regularly Currently not monitoring his blood sugars New One Touch Verio meter was given today Emphasized the need to restart his metformin which was prescribed today Also to take insulin consistently before meals and increase the dose to 22 units at least for now  He needs to be seen back in follow-up in 2 months  Discussed checking his feet regularly   HYPERTENSION: Blood pressure is relatively high  even on second measurement Also his pulse appears to be relatively fast Not clear why he has not been on a beta-blocker, has history of atrial arrhythmias also  He thinks he is taking his other medications regularly Will start him on metoprolol ER 25 mg daily and have him follow-up with cardiologist who is managing his blood pressure, to continue his amlodipine and lisinopril also  He needs to have a PCP for general care  Total visit time for evaluation and management of multiple problems and counseling =25  minutes  Patient Instructions  Increase insulin to 22 units before meals   Check blood sugars on waking up 3-4 days a week  Also check blood sugars about 2 hours after meals and do this after different meals by rotation  Recommended blood sugar levels on waking up are 90-130 and about 2 hours after meal is 130-160  Please bring your blood sugar monitor to each visit, thank you      Elayne Snare 01/04/2019, 9:17 AM

## 2019-01-04 NOTE — Telephone Encounter (Signed)
Requesting samples of Eliquis / tg

## 2019-01-27 ENCOUNTER — Telehealth: Payer: Self-pay | Admitting: Cardiovascular Disease

## 2019-01-27 NOTE — Telephone Encounter (Signed)
Patient's daughter called with concern about patient having episode of "leg giving out" last night. Wants advice as to what to do in regards to that. / tg

## 2019-01-27 NOTE — Telephone Encounter (Signed)
Spoke with daughter who states that pt is feeling better today. Encourged pt to be seen by PCP. Daughter states that pt does not currently have PCP d/t having specialist. Daughter will monitor.

## 2019-02-06 DIAGNOSIS — C61 Malignant neoplasm of prostate: Secondary | ICD-10-CM | POA: Diagnosis not present

## 2019-02-13 DIAGNOSIS — N471 Phimosis: Secondary | ICD-10-CM | POA: Diagnosis not present

## 2019-02-13 DIAGNOSIS — C61 Malignant neoplasm of prostate: Secondary | ICD-10-CM | POA: Diagnosis not present

## 2019-02-13 DIAGNOSIS — R3912 Poor urinary stream: Secondary | ICD-10-CM | POA: Diagnosis not present

## 2019-02-28 ENCOUNTER — Other Ambulatory Visit: Payer: Self-pay | Admitting: Endocrinology

## 2019-03-02 ENCOUNTER — Telehealth: Payer: Self-pay | Admitting: *Deleted

## 2019-03-02 NOTE — Telephone Encounter (Signed)
   Cardiac Questionnaire:    Since your last visit or hospitalization:    1. Have you been having new or worsening chest pain? No   2. Have you been having new or worsening shortness of breath? No 3. Have you been having new or worsening leg swelling, wt gain, or increase in abdominal girth (pants fitting more tightly)? No   4. Have you had any passing out spells? NO    *A YES to any of these questions would result in the appointment being kept. *If all the answers to these questions are NO, we should indicate that given the current situation regarding the worldwide coronarvirus pandemic, at the recommendation of the CDC, we are looking to limit gatherings in our waiting area, and thus will reschedule their appointment beyond four weeks from today.   _____________    Pt would like to call office back to reschedule

## 2019-03-07 ENCOUNTER — Ambulatory Visit: Payer: BLUE CROSS/BLUE SHIELD | Admitting: Endocrinology

## 2019-03-10 ENCOUNTER — Ambulatory Visit: Payer: Medicare Other | Admitting: Cardiovascular Disease

## 2019-03-13 ENCOUNTER — Other Ambulatory Visit: Payer: Self-pay | Admitting: Endocrinology

## 2019-04-14 ENCOUNTER — Other Ambulatory Visit: Payer: Self-pay | Admitting: Cardiovascular Disease

## 2019-04-14 MED ORDER — AMLODIPINE BESYLATE 10 MG PO TABS: ORAL_TABLET | ORAL | 3 refills | Status: DC

## 2019-04-14 NOTE — Telephone Encounter (Signed)
Done

## 2019-04-14 NOTE — Telephone Encounter (Signed)
°*  STAT* If patient is at the pharmacy, call can be transferred to refill team.   1. Which medications need to be refilled? (please list name of each medication and dose if known) amLODipine (NORVASC) 10 MG tablet [[id number]]    2. Which pharmacy/location (including street and city if local pharmacy) is medication to be sent to? [facility]  3. Do they need a 30 day or 90 day supply? [place]

## 2019-04-21 ENCOUNTER — Telehealth: Payer: Self-pay | Admitting: Endocrinology

## 2019-04-21 NOTE — Telephone Encounter (Signed)
Please advise 

## 2019-04-21 NOTE — Telephone Encounter (Signed)
Patient's daughter Inez Catalina called re: patient's blood pressure is running high (approx. 194/82 and 155/80). Daughter is concerned because patient takes 3 different blood pressure medications daily. Please call Inez Catalina at ph# [phone] to advise.

## 2019-04-21 NOTE — Telephone Encounter (Signed)
Discussed with patient's daughter. His blood pressure last night was 429 systolic and previous to that it was 037 systolic Unclear what brand of blood pressure meter is using He is asymptomatic He is taking all his medications as prescribed, amlodipine prescribed by cardiologist Recommended that he use an [employer] meter and if blood pressure is still higher than 150 next week to call Also encouraged him to set up an appointment with PCP

## 2019-04-27 ENCOUNTER — Other Ambulatory Visit (INDEPENDENT_AMBULATORY_CARE_PROVIDER_SITE_OTHER): Payer: Medicare Other

## 2019-04-27 ENCOUNTER — Other Ambulatory Visit: Payer: Self-pay

## 2019-04-27 DIAGNOSIS — E1165 Type 2 diabetes mellitus with hyperglycemia: Secondary | ICD-10-CM | POA: Diagnosis not present

## 2019-04-27 DIAGNOSIS — E7800 Pure hypercholesterolemia, unspecified: Secondary | ICD-10-CM

## 2019-04-27 DIAGNOSIS — Z794 Long term (current) use of insulin: Secondary | ICD-10-CM

## 2019-04-27 LAB — BASIC METABOLIC PANEL
BUN: 18 mg/dL (ref 6–23)
CO2: 23 mEq/L (ref 19–32)
Calcium: 9 mg/dL (ref 8.4–10.5)
Chloride: 105 mEq/L (ref 96–112)
Creatinine, Ser: 1.12 mg/dL (ref 0.40–1.50)
GFR: 62.61 mL/min (ref >60.00)
Glucose, Bld: 86 mg/dL (ref 70–99)
Potassium: 4.1 mEq/L (ref 3.5–5.1)
Sodium: 140 mEq/L (ref 135–145)

## 2019-04-27 LAB — LIPID PANEL
Cholesterol: 116 mg/dL (ref 0–200)
HDL: 45 mg/dL (ref >39.00)
LDL Cholesterol: 58 mg/dL (ref 0–99)
NonHDL: 70.67
Total CHOL/HDL Ratio: 3
Triglycerides: 65 mg/dL (ref 0.0–149.0)
VLDL: 13 mg/dL (ref 0.0–40.0)

## 2019-04-28 LAB — FRUCTOSAMINE: Fructosamine: 251 umol/L (ref 0–285)

## 2019-05-05 ENCOUNTER — Ambulatory Visit (INDEPENDENT_AMBULATORY_CARE_PROVIDER_SITE_OTHER): Payer: Medicare Other | Admitting: Endocrinology

## 2019-05-05 ENCOUNTER — Other Ambulatory Visit: Payer: Self-pay

## 2019-05-05 ENCOUNTER — Encounter: Payer: Self-pay | Admitting: Endocrinology

## 2019-05-05 VITALS — BP 120/60 | HR 76 | Ht 72.0 in | Wt 205.6 lb

## 2019-05-05 DIAGNOSIS — E1165 Type 2 diabetes mellitus with hyperglycemia: Secondary | ICD-10-CM | POA: Diagnosis not present

## 2019-05-05 DIAGNOSIS — I10 Essential (primary) hypertension: Secondary | ICD-10-CM | POA: Diagnosis not present

## 2019-05-05 DIAGNOSIS — E7800 Pure hypercholesterolemia, unspecified: Secondary | ICD-10-CM | POA: Diagnosis not present

## 2019-05-05 DIAGNOSIS — Z794 Long term (current) use of insulin: Secondary | ICD-10-CM | POA: Diagnosis not present

## 2019-05-05 LAB — POCT GLYCOSYLATED HEMOGLOBIN (HGB A1C): Hemoglobin A1C: 6 % — AB (ref 4.0–5.6)

## 2019-05-05 NOTE — Patient Instructions (Addendum)
TAKE PM SHOT 10 MIN BEFORE THE SUPPER MEAL  Check blood sugars on waking up 3 days a week  Also check blood sugars about 2 hours after meals and do this after different meals by rotation  Recommended blood sugar levels on waking up are 90-130 and about 2 hours after meal is 130-180  Please bring your blood sugar monitor to each visit, thank you  BRING MEDS TO each visit

## 2019-05-05 NOTE — Progress Notes (Signed)
Patient ID: Steve Bailey, male   DOB: 14-Aug-1934, 85 y.o.   MRN: [id number]    Reason for Appointment:  follow-up of various problems  History of Present Illness    Diagnosis: Type 2 DIABETES MELITUS, date of diagnosis: 1995              History:     Insulin regimen: Humulin 70/30,  20 units  a.c. twice a day with pens      Oral hypoglycemic drugs: Metformin ER 1500 mg daily   His A1c has been mostly under 7% and now 6  Current management, blood sugar patterns and problems:  He finally brought his blood sugar meter for download, previously had not been checking his blood sugar or not bring his meter  He is checking his blood sugar in the evenings but not clear which readings are before and which are after supper  His blood sugars are relatively close to about 130 and only slightly higher if checked after 8 PM  Only once he had a blood sugar of 52 around 6 PM and his daughter thinks that this was probably from working outside for long time  Also does not report any low blood sugar symptoms during the day  Has only 1 reading in the morning  His daughter thinks that he was drinking a lot of juices and more recently has switched to Gatorade without sugar  Also previously had gained some weight but this is improved compared to his last visit on 01/04/2019  Unclear whether he is taking his evening insulin before dinner or bedtime, patient has difficulty hearing and not able to communicate well today  Previously was taking Actos for insulin resistance, he stopped taking this because of fear of side effects   Glucometer: Freestyle  Blood sugar readings    PRE-MEAL Fasting Lunch  6-9 PM Bedtime Overall  Glucose range:  122  86-140  52-188  81   Mean/median:    135   129   POST-MEAL PC Breakfast PC Lunch PC Dinner  Glucose range:     Mean/median:          Carbohydrate intake: at meals: Mostly with vegetables, also getting some lean meats like Chicken and pork  Egg, bacon, grits in am   Physical activity: Generally active with farming activities, less now  Weight history:  Wt Readings from Last 3 Encounters:  05/05/19 205 lb 9.6 oz (93.3 kg)  01/04/19 213 lb (96.6 kg)  01/03/19 200 lb (90.7 kg)      Lab Results  Component Value Date   HGBA1C 6.0 (A) 05/05/2019   HGBA1C 7.0 (H) 12/30/2018   HGBA1C 6.7 (H) 08/30/2018   Lab Results  Component Value Date   MICROALBUR 15.0 (H) 12/30/2018   LDLCALC 58 04/27/2019   CREATININE 1.12 04/27/2019    Other active problems: See review of systems    Allergies as of 05/05/2019      Reactions   Cephalexin Nausea And Vomiting      Medication List       Accurate as of May 05, 2019  4:25 PM. If you have any questions, ask your nurse or doctor.        acetaminophen 500 MG tablet Commonly known as: TYLENOL Take 1,300 mg by mouth at bedtime.   alfuzosin 10 MG 24 hr tablet Commonly known as: UROXATRAL Take 10 mg by mouth at bedtime. Reported on 12/18/2015   amLODipine 10 MG tablet Commonly known as:  NORVASC TAKE 1 TABLET (10 MG TOTAL) BY MOUTH DAILY.   atorvastatin 40 MG tablet Commonly known as: LIPITOR TAKE 1 TABLET BY MOUTH EVERY DAY   Cinnamon 500 MG capsule Take 500 mg by mouth 2 (two) times daily.   Eliquis 5 MG Tabs tablet Generic drug: apixaban TAKE 1 TABLET BY MOUTH TWICE A DAY   finasteride 5 MG tablet Commonly known as: PROSCAR Take 5 mg by mouth every morning. Reported on 12/18/2015   glucose blood test strip Commonly known as: FREESTYLE LITE USE AS DIRECTED TWICE A DAY DX CODE:11.65   Insulin Isophane & Regular Human (70-30) 100 UNIT/ML PEN Commonly known as: HumuLIN 70/30 KwikPen INJECT 18 UNITS INTO THE SKIN EVERY MORNING & 22 UNITS AT SUPPER DAILY - NEEDS APPT   lisinopril 20 MG tablet Commonly known as: ZESTRIL TAKE 1 TABLET BY MOUTH EVERY DAY   metFORMIN 750 MG 24 hr tablet Commonly known as: GLUCOPHAGE-XR Take 2 tablets (1,500 mg total) by mouth  daily with breakfast.   metoprolol succinate 25 MG 24 hr tablet Commonly known as: TOPROL-XL Take 1 tablet (25 mg total) by mouth daily.   OMEGA-3 FISH OIL PO Take 1 capsule by mouth daily.   Pen Needles 31G X 5 MM Misc 1 each by Does not apply route 2 (two) times daily.   traMADol 50 MG tablet Commonly known as: Ultram Take 1 tablet (50 mg total) by mouth every 6 (six) hours as needed for moderate pain. Post-operatively       Allergies:  Allergies  Allergen Reactions  . Cephalexin Nausea And Vomiting    Past Medical History:  Diagnosis Date  . AAA (abdominal aortic aneurysm) ([place]) followed by dr Bridgett Larsson   last duplex 06/28/2018 4.6 cm,  asymptomatic  . Anticoagulant long-term use    eliquis  . Benign localized prostatic hyperplasia with lower urinary tract symptoms (LUTS)   . CAD (coronary artery disease) cardiologist-  dr Bronson Ing   remote CABG in 1997; prior PCI to the LAD i n1990; Nuclear study in February of 2012 normal with an EF of 60%   . DDD (degenerative disc disease), cervical   . Full dentures   . History of radiation therapy 11/24/12-01/18/13   Prostate 78Gy/82fx  . HOH (hard of hearing)    both  . Hyperlipidemia   . Hyperlipidemia   . Hypertension   . Permanent atrial fibrillation 09/2012   followed by dr Bronson Ing  . Phimosis    severe  . Proliferative diabetic retinopathy ([place])    both eyes  . Prostate cancer [facility]) urologist-- dr Tresa Moore   dx 09-02-2012-- Stage T2a, Gleason 3+4=7, PSA 8.12, vol 103 cc--- completed external beam radiation 02/ 2014;  recurrent 2015 , started hormone therapy  . S/P CABG x 3 1997  . Strains to urinate   . Type 2 diabetes mellitus treated with insulin [facility])    endocrinologist-- dr Dwyane Dee    Past Surgical History:  Procedure Laterality Date  . CARDIAC CATHETERIZATION  01-18-2004   dr Doreatha Lew   patent grafts, ef 55%, minimal anterior hypokinesis,  severe total occlusion of LCFx and LAD,  severe disease of D2 and segmental  RCA  . CARDIOVASCULAR STRESS TEST  01/07/2011   normal nuclear study w/ no ischemia/  normal LV function and wall motion,  ef 60%  . CARPAL TUNNEL RELEASE Right   . CATARACT EXTRACTION W/ INTRAOCULAR LENS  IMPLANT, BILATERAL  left 2006;  right 2010  . CIRCUMCISION N/A 07/28/2018  Procedure: CIRCUMCISION ADULT;  Surgeon: Alexis Frock, MD;  Location: [facility];  Service: Urology;  Laterality: N/A;  . 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 CORONARY ANGIOPLASTY  1990   LAD  . CORONARY ARTERY BYPASS GRAFT  11-1995   shows distal LAD disease, without recurrent symptoms of angina with questionable mild apical ischemia but with normal EF   . ESOPHAGOGASTRODUODENOSCOPY (EGD) WITH ESOPHAGEAL DILATION  05/2012  . KNEE ARTHROSCOPY Left   . TRANSTHORACIC ECHOCARDIOGRAM  10/10/2012   ef 55%, akinesis of the inferolateral wall,  grade 2 diastolic dysfunction/  trivial AR/  mild MR and TR/  mild LAE/  mild RVSF reduced and mild dilated RV    Family History  Problem Relation Age of Onset  . Stroke Father   . Heart attack Mother   . Heart disease Mother   . Cancer Sister        lung  . Cancer Brother        prostate  . Diabetes Brother   . Stroke Brother   . Stroke Brother   . Heart disease Brother   . Heart disease Brother   . Heart attack Sister   . Heart disease Sister   . Heart attack Sister   . Cancer Sister        breast  . Heart disease Sister   . Heart attack Sister   . Heart disease Sister   . Heart disease Sister     Social History:  reports that he quit smoking about 39 years ago. His smoking use included cigarettes. He quit after 20.00 years of use. He quit smokeless tobacco use about 6 years ago.  His smokeless tobacco use included chew. He reports that he does not drink alcohol or use drugs.  ROS   HYPERTENSION:   Has been present for several years.    He has been treated with lisinopril 20 mg, amlodipine 10mg , also metoprolol 25 mg His amlodipine has  been prescribed by cardiologist On his last visit metoprolol was started because of a fast heart rate  His daughter had called last month about high blood pressure readings but she did not bring his monitor for comparison Recently blood pressure at home is relatively better   BP Readings from Last 3 Encounters:  05/05/19 120/60  01/04/19 (!) 154/80  01/03/19 (!) 166/92          HYPERLIPIDEMIA:    he has had high LDL and this has been well-controlled with 40 mg Lipitor Labs as follows:   Lab Results  Component Value Date   CHOL 116 04/27/2019   HDL 45.00 04/27/2019   LDLCALC 58 04/27/2019   TRIG 65.0 04/27/2019   CHOLHDL 3 04/27/2019    Diabetic foot exam done in 2/20 showed normal monofilament sensation and absent pulses  He is being followed by vascular surgeon for aortic aneurysm  He is being followed regularly by [facility] ophthalmology, has corneal dystrophy  Last TSH normal in 10/19    Examination:   BP 120/60 (BP Location: Left Arm, Patient Position: Sitting, Cuff Size: Normal)   Pulse 76   Ht 6' (1.829 m)   Wt 205 lb 9.6 oz (93.3 kg)   SpO2 96%   BMI 27.88 kg/m   Body mass index is 27.88 kg/m.   No pedal edema present  Assesment/PLAN:  DIABETES, Type II on insulin:  See history of present illness for  discussion of current diabetes management and problems identified  His A1c  is 6 %  He has checked his blood sugars and recently they appear to be better although his only checking his blood sugars randomly in the evenings around suppertime As discussed above not clear if he is taking his evening insulin before eating No excessive hypoglycemia and only one episode recently Since his blood sugars are overall stable without hypoglycemia will not change his insulin and will continue 20 units twice daily  However emphasized the need to check blood sugars by rotation at different times of the day Reminded him to have an extra snack if he is planning to be  very active outside  He needs to be seen back in follow-up in 2 months  Discussed checking his feet regularly   HYPERTENSION: Blood pressure is excellent now Also blood pressure and pulse are improved with adding his metoprolol Not clear if his meter at home is accurate and why he was having high readings a couple of weeks ago Has normal urine microalbumin as of 2/20  Since he is going to be seeing his PCP soon he will take his monitor for comparison  LIPIDS: Well controlled  Total visit time for evaluation and management of multiple problems and counseling =25 minutes  Patient Instructions  TAKE PM SHOT 10 MIN BEFORE THE SUPPER MEAL  Check blood sugars on waking up 3 days a week  Also check blood sugars about 2 hours after meals and do this after different meals by rotation  Recommended blood sugar levels on waking up are 90-130 and about 2 hours after meal is 130-180  Please bring your blood sugar monitor to each visit, thank you  BRING MEDS TO each visit    Elayne Snare 05/05/2019, 4:25 PM

## 2019-05-08 ENCOUNTER — Other Ambulatory Visit: Payer: Self-pay | Admitting: Endocrinology

## 2019-05-30 ENCOUNTER — Other Ambulatory Visit: Payer: Self-pay | Admitting: Endocrinology

## 2019-06-13 ENCOUNTER — Other Ambulatory Visit: Payer: Self-pay | Admitting: Endocrinology

## 2019-06-20 DIAGNOSIS — H353221 Exudative age-related macular degeneration, left eye, with active choroidal neovascularization: Secondary | ICD-10-CM | POA: Diagnosis not present

## 2019-06-20 DIAGNOSIS — E113513 Type 2 diabetes mellitus with proliferative diabetic retinopathy with macular edema, bilateral: Secondary | ICD-10-CM | POA: Diagnosis not present

## 2019-06-29 ENCOUNTER — Other Ambulatory Visit: Payer: Self-pay

## 2019-06-29 DIAGNOSIS — I714 Abdominal aortic aneurysm, without rupture, unspecified: Secondary | ICD-10-CM

## 2019-07-04 ENCOUNTER — Ambulatory Visit (HOSPITAL_COMMUNITY)
Admission: RE | Admit: 2019-07-04 | Discharge: 2019-07-04 | Disposition: A | Discharge status: Home / Self Care | Payer: Medicare Other | Source: Ambulatory Visit | Attending: Family | Admitting: Family

## 2019-07-04 ENCOUNTER — Ambulatory Visit: Payer: BLUE CROSS/BLUE SHIELD | Admitting: Family

## 2019-07-04 ENCOUNTER — Other Ambulatory Visit: Payer: Self-pay

## 2019-07-04 DIAGNOSIS — I714 Abdominal aortic aneurysm, without rupture, unspecified: Secondary | ICD-10-CM

## 2019-07-05 ENCOUNTER — Telehealth: Payer: Self-pay | Admitting: *Deleted

## 2019-07-05 NOTE — Telephone Encounter (Signed)
Virtual Visit Pre-Appointment Phone Call  Today, I spoke with Steve Bailey daughter Steve Bailey and performed the following actions:  1. I explained that we are currently trying to limit exposure to the COVID-19 virus by seeing patients at home rather than in the office.  I explained that the visits are best done by video, but can be done by telephone.  I asked the patient if a virtual visit that the patient would like to try instead of coming into the office. Steve Bailey agreed to proceed with the virtual visit scheduled with Steve Bailey on 07/07/19 .     2. I confirmed the BEST phone number to call the day of the visit and- I included this in appointment notes.  3. I asked if the patient had access to (through a family member/friend) a smartphone with video capability to be used for his visit?"  The patient said yes -    4. I confirmed consent by  a. sending through [place] or by email the [place] as written at the end of this message or  b. verbally as listed below. i. This visit is being performed in the setting of COVID-19. ii. All virtual visits are billed to your insurance company just like a normal visit would be.   iii. We'd like you to understand that the technology does not allow for your provider to perform an examination, and thus may limit your provider's ability to fully assess your condition.  iv. If your provider identifies any concerns that need to be evaluated in person, we will make arrangements to do so.   v. Finally, though the technology is pretty good, we cannot assure that it will always work on either your or our end, and in the setting of a video visit, we may have to convert it to a phone-only visit.  In either situation, we cannot ensure that we have a secure connection.   vi. Are you willing to proceed?"  STAFF: Did the patient verbally acknowledge consent to telehealth visit? Document YES/NO here: YES  2. I advised  the patient to be prepared - I asked that the patient, on the day of his visit, record any information possible with the equipment at his home, such as blood pressure, pulse, oxygen saturation, and your weight and write them all down. I asked the patient to have a pen and paper handy nearby the day of the visit as well.  3. If the patient was scheduled for a video visit, I informed the patient that the visit with the doctor would start with a text to the smartphone # given to [place] by the patient.         If the patient was scheduled for a telephone call, I informed the patient that the visit with the doctor would start with a call to the telephone # given to [place] by the patient.  4. I Informed patient they will receive a phone call 15 minutes prior to their appointment time from a [place] or nurse to review medications, allergies, etc. to prepare for the visit.    TELEPHONE CALL NOTE  Steve Bailey has been deemed a candidate for a follow-up tele-health visit to limit community exposure during the Covid-19 pandemic. I spoke with the patient via phone to ensure availability of phone/video source, confirm preferred email & phone number, and discuss instructions and expectations.  I reminded Steve Bailey to be prepared with any vital sign and/or  heart rhythm information that could potentially be obtained via home monitoring, at the time of his visit. I reminded Steve Bailey to expect a phone call prior to his visit.  Steve Bailey, NT 07/05/2019 1:19 PM     FULL LENGTH CONSENT FOR TELE-HEALTH VISIT   I hereby voluntarily request, consent and authorize [facility] and its employed or contracted physicians, physician assistants, nurse practitioners or other licensed health care professionals (the Practitioner), to provide me with telemedicine health care services (the "Services") as deemed necessary by the treating Practitioner. I acknowledge and consent to receive the Services by the Practitioner via  telemedicine. I understand that the telemedicine visit will involve communicating with the Practitioner through live audiovisual communication technology and the disclosure of certain medical information by electronic transmission. I acknowledge that I have been given the opportunity to request an in-person assessment or other available alternative prior to the telemedicine visit and am voluntarily participating in the telemedicine visit.  I understand that I have the right to withhold or withdraw my consent to the use of telemedicine in the course of my care at any time, without affecting my right to future care or treatment, and that the Practitioner or I may terminate the telemedicine visit at any time. I understand that I have the right to inspect all information obtained and/or recorded in the course of the telemedicine visit and may receive copies of available information for a reasonable fee.  I understand that some of the potential risks of receiving the Services via telemedicine include:  [place] Delay or interruption in medical evaluation due to technological equipment failure or disruption; . Information transmitted may not be sufficient (e.g. poor resolution of images) to allow for appropriate medical decision making by the Practitioner; and/or  . In rare instances, security protocols could fail, causing a breach of personal health information.  Furthermore, I acknowledge that it is my responsibility to provide information about my medical history, conditions and care that is complete and accurate to the best of my ability. I acknowledge that Practitioner's advice, recommendations, and/or decision may be based on factors not within their control, such as incomplete or inaccurate data provided by me or distortions of diagnostic images or specimens that may result from electronic transmissions. I understand that the practice of medicine is not an exact science and that Practitioner makes no warranties or  guarantees regarding treatment outcomes. I acknowledge that I will receive a copy of this consent concurrently upon execution via email to the email address I last provided but may also request a printed copy by calling the office of [place].    I understand that my insurance will be billed for this visit.   I have read or had this consent read to me. . I understand the contents of this consent, which adequately explains the benefits and risks of the Services being provided via telemedicine.  . I have been provided ample opportunity to ask questions regarding this consent and the Services and have had my questions answered to my satisfaction. . I give my informed consent for the services to be provided through the use of telemedicine in my medical care  By participating in this telemedicine visit I agree to the above.

## 2019-07-07 ENCOUNTER — Other Ambulatory Visit: Payer: Self-pay | Admitting: *Deleted

## 2019-07-07 ENCOUNTER — Encounter: Payer: Self-pay | Admitting: Family

## 2019-07-07 ENCOUNTER — Ambulatory Visit (INDEPENDENT_AMBULATORY_CARE_PROVIDER_SITE_OTHER): Payer: Medicare Other | Admitting: Family

## 2019-07-07 ENCOUNTER — Other Ambulatory Visit: Payer: Self-pay

## 2019-07-07 DIAGNOSIS — I714 Abdominal aortic aneurysm, without rupture, unspecified: Secondary | ICD-10-CM

## 2019-07-07 NOTE — Patient Instructions (Signed)
Abdominal Aortic Aneurysm  An aneurysm is a bulge in one of the blood vessels that carry blood away from the heart (artery). It happens when blood pushes up against a weak or damaged place in the wall of an artery. An abdominal aortic aneurysm happens in the main artery of the body (aorta). Some aneurysms may not cause problems. If it grows, it can burst or tear, causing bleeding inside the body. This is an emergency. It needs to be treated right away. What are the causes? The exact cause of this condition is not known. What increases the risk? The following may make you more likely to get this condition:  Being a male who is 60 years of age or older.  Being white (Caucasian).  Using tobacco.  Having a family history of aneurysms.  Having the following conditions: ? Hardening of the arteries (arteriosclerosis). ? Inflammation of the walls of an artery (arteritis). ? Certain genetic conditions. ? Being very overweight (obesity). ? An infection in the wall of the aorta (infectious aortitis). ? High cholesterol. ? High blood pressure (hypertension). What are the signs or symptoms? Symptoms depend on the size of the aneurysm and how fast it is growing. Most grow slowly and do not cause any symptoms. If symptoms do occur, they may include:  Pain in the belly (abdomen), side, or back.  Feeling full after eating only small amounts of food.  Feeling a throbbing lump in the belly. Symptoms that the aneurysm has burst (ruptured) include:  Sudden, very bad pain in the belly, side, or back.  Feeling sick to your stomach (nauseous).  Throwing up (vomiting).  Feeling light-headed or passing out. How is this treated? Treatment for this condition depends on:  The size of the aneurysm.  How fast it is growing.  Your age.  Your risk of having it burst. If your aneurysm is smaller than 2 inches (5 cm), your doctor may manage it by:  Checking it often to see if it is getting bigger.  You may have an imaging test (ultrasound) to check it every 3-6 months, every year, or every few years.  Giving you medicines to: ? Control blood pressure. ? Treat pain. ? Fight infection. If your aneurysm is larger than 2 inches (5 cm), you may need surgery to fix it. Follow these instructions at home: Lifestyle  Do not use any products that have nicotine or tobacco in them. This includes cigarettes, e-cigarettes, and chewing tobacco. If you need help quitting, ask your doctor.  Get regular exercise. Ask your doctor what types of exercise are best for you. Eating and drinking  Eat a heart-healthy diet. This includes eating plenty of: ? Fresh fruits and vegetables. ? Whole grains. ? Low-fat (lean) protein. ? Low-fat dairy products.  Avoid foods that are high in saturated fat and cholesterol. These foods include red meat and some dairy products.  Do not drink alcohol if: ? Your doctor tells you not to drink. ? You are pregnant, may be pregnant, or are planning to become pregnant.  If you drink alcohol: ? Limit how much you use to:  0-1 drink a day for women.  0-2 drinks a day for men. ? Be aware of how much alcohol is in your drink. In the [place] one drink equals any of these:  One typical bottle of beer (12 oz).  One-half glass of wine (5 oz).  One shot of hard liquor (1 oz). General instructions  Take over-the-counter and prescription medicines only as   told by your doctor.  Keep your blood pressure within normal limits. Ask your doctor what your blood pressure should be.  Have your blood sugar (glucose) level and cholesterol levels checked regularly. Keep your blood sugar level and cholesterol levels within normal limits.  Avoid heavy lifting and activities that take a lot of effort. Ask your doctor what activities are safe for you.  Keep all follow-up visits as told by your doctor. This is important. ? Talk to your doctor about regular screenings to see if the  aneurysm is getting bigger. Contact a doctor if you:  Have pain in your belly, side, or back.  Have a throbbing feeling in your belly.  Have a family history of aneurysms. Get help right away if you:  Have sudden, bad pain in your belly, side, or back.  Feel sick to your stomach.  Throw up.  Have trouble pooping (constipation).  Have trouble peeing (urinating).  Feel light-headed.  Have a fast heart rate when you stand.  Have sweaty skin that is cold to the touch (clammy).  Have shortness of breath.  Have a fever. These symptoms may be an emergency. Do not wait to see if the symptoms will go away. Get medical help right away. Call your local emergency services (911 in the [place] Do not drive yourself to the hospital. Summary  An aneurysm is a bulge in one of the blood vessels that carry blood away from the heart (artery). Some aneurysms may not cause problems.  You may need to have yours checked often. If it grows, it can burst or tear. This causes bleeding inside the body. It needs to be treated right away.  Follow instructions from your doctor about healthy lifestyle changes.  Keep all follow-up visits as told by your doctor. This is important. This information is not intended to replace advice given to you by your health care provider. Make sure you discuss any questions you have with your health care provider. Document Released: 03/06/2013 Document Revised: 02/27/2019 Document Reviewed: 06/18/2018 Elsevier Patient Education  2020 [employer]  

## 2019-07-07 NOTE — Progress Notes (Signed)
Virtual Visit via Telephone Note   I connected with [name] on 07/07/2019 using the [employer] by telephone and verified that I was speaking with the correct person using two identifiers. Patient was located at his home and accompanied by his daughter Better whom I spoke with since pt is hard of hearing. I am located at the [facility] office/clinic.   The limitations of evaluation and management by telemedicine and the availability of in person appointments have been previously discussed with the patient and are documented in the patients chart. The patient expressed understanding and consented to proceed.  PCP: Patient, No Pcp Per  Chief Complaint: follow up AAA  History of Present Illness: Steve Bailey is a 85 y.o. male whom Dr. Bridgett Larsson had been monitoring for AAA. Previous studies demonstrate an AAA, measuring 4.4 cm x 4.4cm.   He denies back or abdominal pain.   He fell off a ladder in 2017 and imaging done at that time found the AAA.  He denies claudication type symptoms in his legs with walking. He states his left knee bothers him sometimes in the morning, but otherwise not.   He denies any history of stroke or TIA symptoms.  Dtr states his blood pressure at his PCP's office was good, he states 150/70  He has been in remission from prostate cancer.  He takes Eliquis for atrial fib.   Diabetic:Yes, last A1C result on file was 6.0 on 05-05-19, in good control Tobacco DGL:OVFIEP smoker, quitin 1980    Past Medical History:  Diagnosis Date  . AAA (abdominal aortic aneurysm) ([place]) followed by dr Bridgett Larsson   last duplex 06/28/2018 4.6 cm,  asymptomatic  . Anticoagulant long-term use    eliquis  . Benign localized prostatic hyperplasia with lower urinary tract symptoms (LUTS)   . CAD (coronary artery disease) cardiologist-  dr Bronson Ing   remote CABG in 1997; prior PCI to the LAD i n1990; Nuclear study in February of 2012 normal with an EF of 60%   . DDD (degenerative  disc disease), cervical   . Full dentures   . History of radiation therapy 11/24/12-01/18/13   Prostate 78Gy/49fx  . HOH (hard of hearing)    both  . Hyperlipidemia   . Hyperlipidemia   . Hypertension   . Permanent atrial fibrillation 09/2012   followed by dr Bronson Ing  . Phimosis    severe  . Proliferative diabetic retinopathy ([place])    both eyes  . Prostate cancer [facility]) urologist-- dr Tresa Moore   dx 09-02-2012-- Stage T2a, Gleason 3+4=7, PSA 8.12, vol 103 cc--- completed external beam radiation 02/ 2014;  recurrent 2015 , started hormone therapy  . S/P CABG x 3 1997  . Strains to urinate   . Type 2 diabetes mellitus treated with insulin [facility])    endocrinologist-- dr Dwyane Dee    Past Surgical History:  Procedure Laterality Date  . CARDIAC CATHETERIZATION  01-18-2004   dr Doreatha Lew   patent grafts, ef 55%, minimal anterior hypokinesis,  severe total occlusion of LCFx and LAD,  severe disease of D2 and segmental RCA  . CARDIOVASCULAR STRESS TEST  01/07/2011   normal nuclear study w/ no ischemia/  normal LV function and wall motion,  ef 60%  . CARPAL TUNNEL RELEASE Right   . CATARACT EXTRACTION W/ INTRAOCULAR LENS  IMPLANT, BILATERAL  left 2006;  right 2010  . CIRCUMCISION N/A 07/28/2018   Procedure: CIRCUMCISION ADULT;  Surgeon: Alexis Frock, MD;  Location: [facility];  Service:  Urology;  Laterality: N/A;  . 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 CORONARY ANGIOPLASTY  1990   LAD  . CORONARY ARTERY BYPASS GRAFT  11-1995   shows distal LAD disease, without recurrent symptoms of angina with questionable mild apical ischemia but with normal EF   . ESOPHAGOGASTRODUODENOSCOPY (EGD) WITH ESOPHAGEAL DILATION  05/2012  . KNEE ARTHROSCOPY Left   . TRANSTHORACIC ECHOCARDIOGRAM  10/10/2012   ef 55%, akinesis of the inferolateral wall,  grade 2 diastolic dysfunction/  trivial AR/  mild MR and TR/  mild LAE/  mild RVSF reduced and mild dilated RV    No outpatient medications have been  marked as taking for the 07/07/19 encounter (Appointment) with Nickel, Sharmon Leyden, NP.    12 system ROS was negative unless otherwise noted in HPI   Observations/Objective:  AAA Duplex (07-04-19): Abdominal Aorta Findings: +-----------+-------+----------+----------+--------+--------+--------+ Location   AP (cm)Trans (cm)PSV (cm/s)WaveformThrombusComments +-----------+-------+----------+----------+--------+--------+--------+ Proximal   2.06   2.54      80                                 +-----------+-------+----------+----------+--------+--------+--------+ Mid        4.61   4.95      55                                 +-----------+-------+----------+----------+--------+--------+--------+ Distal     2.86   2.93      130                                +-----------+-------+----------+----------+--------+--------+--------+ RT CIA Prox1.2    1.5       178                                +-----------+-------+----------+----------+--------+--------+--------+ LT CIA Prox1.0    1.4       120                                +-----------+-------+----------+----------+--------+--------+--------+ Summary: Abdominal Aorta: There is evidence of abnormal dilitation of the Mid Abdominal aorta. The largest aortic measurement is 5.0 cm. The largest aortic diameter has increased compared to prior exam. Previous diameter measurement was 4.6 cm obtained on 01/03/2019.   Popliteal Artery Duplex, indication: prominent popliteal pulses (01-03-19): No evidence noted of abnormal dilatation of the bilateral popliteal arteries. Incidental finding: Evidence of a 4.67cm x 1.15cm fluid filled structure noted in the left popliteal fossa, suggestive of a ruptured popliteal cyst.    Assessment and Plan: Increase in AAA diameter to 5 cm from 4.6 cm on 01/03/2019.  He has no symptoms referable to an AAA.  Abdominal aortic aneurysm less than 5-1/2 cm in diameter has less then  1/2% risk of rupture per year.   Serum creatinine was 1.12 on 04-27-19  Follow Up Instructions:   Follow up in 2 months with CTA abdpelvis and see Dr. Carlis Abbott afterward.  Please call pt daughter Inez Catalina at [phone] to make appointment since pt is hard of hearing.    I discussed the assessment and treatment plan with the patient. The patient was provided an opportunity to ask questions and all were answered. The patient agreed with the plan and  demonstrated an understanding of the instructions.   The patient was advised to call back or seek an in-person evaluation if the symptoms worsen or if the condition fails to improve as anticipated.  I spent 14 minutes with the patient's daughter Inez Catalina via telephone encounter.   Gabrielle Dare Nickel [facility] Office: [phone]  07/07/2019, 8:22 AM

## 2019-07-17 DIAGNOSIS — R42 Dizziness and giddiness: Secondary | ICD-10-CM | POA: Diagnosis not present

## 2019-07-17 DIAGNOSIS — I10 Essential (primary) hypertension: Secondary | ICD-10-CM | POA: Diagnosis not present

## 2019-07-17 DIAGNOSIS — D485 Neoplasm of uncertain behavior of skin: Secondary | ICD-10-CM | POA: Diagnosis not present

## 2019-07-17 DIAGNOSIS — Z23 Encounter for immunization: Secondary | ICD-10-CM | POA: Diagnosis not present

## 2019-07-17 DIAGNOSIS — Z0000 Encounter for general adult medical examination without abnormal findings: Secondary | ICD-10-CM | POA: Diagnosis not present

## 2019-07-25 ENCOUNTER — Ambulatory Visit (INDEPENDENT_AMBULATORY_CARE_PROVIDER_SITE_OTHER): Payer: Medicare Other | Admitting: Cardiovascular Disease

## 2019-07-25 ENCOUNTER — Other Ambulatory Visit: Payer: Self-pay

## 2019-07-25 ENCOUNTER — Encounter: Payer: Self-pay | Admitting: Cardiovascular Disease

## 2019-07-25 VITALS — BP 148/86 | HR 76 | Temp 98.4°F | Ht 72.0 in | Wt 197.0 lb

## 2019-07-25 DIAGNOSIS — I25708 Atherosclerosis of coronary artery bypass graft(s), unspecified, with other forms of angina pectoris: Secondary | ICD-10-CM

## 2019-07-25 DIAGNOSIS — I714 Abdominal aortic aneurysm, without rupture, unspecified: Secondary | ICD-10-CM

## 2019-07-25 DIAGNOSIS — I4821 Permanent atrial fibrillation: Secondary | ICD-10-CM

## 2019-07-25 DIAGNOSIS — E785 Hyperlipidemia, unspecified: Secondary | ICD-10-CM

## 2019-07-25 DIAGNOSIS — I10 Essential (primary) hypertension: Secondary | ICD-10-CM

## 2019-07-25 NOTE — Progress Notes (Signed)
SUBJECTIVE: The patient presents for routine follow-up of coronary artery disease. He has a history of coronary artery bypass graft surgery, with his most recent cardiac catheterization performed in 2010. He also has hypertension, hyperlipidemia, IDDM, and atrial fibrillation. He has been noncompliant with his medication regimen in the past.   The patient denies any symptoms of chest pain, palpitations, shortness of breath, lightheadedness, dizziness, leg swelling, orthopnea, PND, and syncope.  He stays active mowing about 3 acres and cutting down tree limbs.   Review of Systems: As per "subjective", otherwise negative.  Allergies  Allergen Reactions  . Cephalexin Nausea And Vomiting    Current Outpatient Medications  Medication Sig Dispense Refill  . acetaminophen (TYLENOL) 500 MG tablet Take 1,300 mg by mouth at bedtime.     Marland Kitchen alfuzosin (UROXATRAL) 10 MG 24 hr tablet Take 10 mg by mouth at bedtime. Reported on 12/18/2015    . amLODipine (NORVASC) 10 MG tablet TAKE 1 TABLET (10 MG TOTAL) BY MOUTH DAILY. 90 tablet 3  . atorvastatin (LIPITOR) 40 MG tablet TAKE 1 TABLET BY MOUTH EVERY DAY 90 tablet 3  . Cinnamon 500 MG capsule Take 500 mg by mouth 2 (two) times daily.    Marland Kitchen ELIQUIS 5 MG TABS tablet TAKE 1 TABLET BY MOUTH TWICE A DAY 180 tablet 2  . finasteride (PROSCAR) 5 MG tablet Take 5 mg by mouth every morning. Reported on 12/18/2015    . glucose blood (FREESTYLE LITE) test strip USE AS DIRECTED TWICE A DAY DX CODE:11.65 100 each 1  . Insulin Isophane & Regular Human (HUMULIN 70/30 KWIKPEN) (70-30) 100 UNIT/ML PEN INJECT 18 UNITS INTO THE SKIN EVERY MORNING & 22 UNITS AT SUPPER DAILY - NEEDS APPT 15 pen 3  . Insulin Pen Needle (PEN NEEDLES) 31G X 5 MM MISC 1 each by Does not apply route 2 (two) times daily. 90 each 2  . lisinopril (PRINIVIL,ZESTRIL) 20 MG tablet TAKE 1 TABLET BY MOUTH EVERY DAY 90 tablet 1  . metFORMIN (GLUCOPHAGE-XR) 750 MG 24 hr tablet TAKE 2 TABLETS BY MOUTH  DAILY WITH BREAKFAST. 180 tablet 1  . metoprolol succinate (TOPROL-XL) 25 MG 24 hr tablet TAKE 1 TABLET BY MOUTH EVERY DAY 90 tablet 1  . Omega-3 Fatty Acids (OMEGA-3 FISH OIL PO) Take 1 capsule by mouth daily.    . prednisoLONE acetate (PRED FORTE) 1 % ophthalmic suspension PLACE 1 DROP INTO THE RIGHT EYE DAILY.    . traMADol (ULTRAM) 50 MG tablet Take 1 tablet (50 mg total) by mouth every 6 (six) hours as needed for moderate pain. Post-operatively 15 tablet 0   No current facility-administered medications for this visit.     Past Medical History:  Diagnosis Date  . AAA (abdominal aortic aneurysm) ([place]) followed by dr Bridgett Larsson   last duplex 06/28/2018 4.6 cm,  asymptomatic  . Anticoagulant long-term use    eliquis  . Benign localized prostatic hyperplasia with lower urinary tract symptoms (LUTS)   . CAD (coronary artery disease) cardiologist-  dr Bronson Ing   remote CABG in 1997; prior PCI to the LAD i n1990; Nuclear study in February of 2012 normal with an EF of 60%   . DDD (degenerative disc disease), cervical   . Full dentures   . History of radiation therapy 11/24/12-01/18/13   Prostate 78Gy/77fx  . HOH (hard of hearing)    both  . Hyperlipidemia   . Hyperlipidemia   . Hypertension   . Permanent atrial fibrillation 09/2012  followed by dr Bronson Ing  . Phimosis    severe  . Proliferative diabetic retinopathy ([place])    both eyes  . Prostate cancer [facility]) urologist-- dr Tresa Moore   dx 09-02-2012-- Stage T2a, Gleason 3+4=7, PSA 8.12, vol 103 cc--- completed external beam radiation 02/ 2014;  recurrent 2015 , started hormone therapy  . S/P CABG x 3 1997  . Strains to urinate   . Type 2 diabetes mellitus treated with insulin [facility])    endocrinologist-- dr Dwyane Dee    Past Surgical History:  Procedure Laterality Date  . CARDIAC CATHETERIZATION  01-18-2004   dr Doreatha Lew   patent grafts, ef 55%, minimal anterior hypokinesis,  severe total occlusion of LCFx and LAD,  severe disease of D2 and  segmental RCA  . CARDIOVASCULAR STRESS TEST  01/07/2011   normal nuclear study w/ no ischemia/  normal LV function and wall motion,  ef 60%  . CARPAL TUNNEL RELEASE Right   . CATARACT EXTRACTION W/ INTRAOCULAR LENS  IMPLANT, BILATERAL  left 2006;  right 2010  . CIRCUMCISION N/A 07/28/2018   Procedure: CIRCUMCISION ADULT;  Surgeon: Alexis Frock, MD;  Location: [facility];  Service: Urology;  Laterality: N/A;  . 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 CORONARY ANGIOPLASTY  1990   LAD  . CORONARY ARTERY BYPASS GRAFT  11-1995   shows distal LAD disease, without recurrent symptoms of angina with questionable mild apical ischemia but with normal EF   . ESOPHAGOGASTRODUODENOSCOPY (EGD) WITH ESOPHAGEAL DILATION  05/2012  . KNEE ARTHROSCOPY Left   . TRANSTHORACIC ECHOCARDIOGRAM  10/10/2012   ef 55%, akinesis of the inferolateral wall,  grade 2 diastolic dysfunction/  trivial AR/  mild MR and TR/  mild LAE/  mild RVSF reduced and mild dilated RV    Social History   Socioeconomic History  . Marital status: Married    Spouse name: Not on file  . Number of children: Not on file  . Years of education: Not on file  . Highest education level: Not on file  Occupational History  . Not on file  Social Needs  . Financial resource strain: Not on file  . Food insecurity    Worry: Not on file    Inability: Not on file  . Transportation needs    Medical: Not on file    Non-medical: Not on file  Tobacco Use  . Smoking status: Former Smoker    Years: 20.00    Types: Cigarettes    Quit date: 10/14/1979    Years since quitting: 39.8  . Smokeless tobacco: Former Systems developer    Types: Margaret date: 11/23/2012  Substance and Sexual Activity  . Alcohol use: No  . Drug use: No  . Sexual activity: Not on file  Lifestyle  . Physical activity    Days per week: Not on file    Minutes per session: Not on file  . Stress: Not on file  Relationships  . Social Herbalist on phone:  Not on file    Gets together: Not on file    Attends religious service: Not on file    Active member of club or organization: Not on file    Attends meetings of clubs or organizations: Not on file    Relationship status: Not on file  . Intimate partner violence    Fear of current or ex partner: Not on file    Emotionally abused: Not on file    Physically  abused: Not on file    Forced sexual activity: Not on file  Other Topics Concern  . Not on file  Social History Narrative  . Not on file     Vitals:   07/25/19 1536  BP: (!) 148/86  Pulse: 76  Temp: 98.4 F (36.9 C)  SpO2: 96%  Weight: 197 lb (89.4 kg)  Height: 6' (1.829 m)    Wt Readings from Last 3 Encounters:  07/25/19 197 lb (89.4 kg)  05/05/19 205 lb 9.6 oz (93.3 kg)  01/04/19 213 lb (96.6 kg)     PHYSICAL EXAM General: NAD HEENT: Normal. Neck: No JVD, no thyromegaly. Lungs: Clear to auscultation bilaterally with normal respiratory effort. CV: Regular rate and irregular rhythm, normal S1/S2, no S3, no murmur. No pretibial or periankle edema.  No carotid bruit.   Abdomen: Soft, nontender, no distention.  Neurologic: Alert and oriented.  Psych: Normal affect. Skin: Normal. Musculoskeletal: No gross deformities.    ECG: Reviewed above under Subjective   Labs: Lab Results  Component Value Date/Time   K 4.1 04/27/2019 09:26 AM   BUN 18 04/27/2019 09:26 AM   BUN 23 09/11/2016 04:45 PM   CREATININE 1.12 04/27/2019 09:26 AM   ALT 22 12/30/2018 09:57 AM   TSH 3.02 08/30/2018 02:46 PM   HGB 12.8 (L) 12/30/2018 09:57 AM     Lipids: Lab Results  Component Value Date/Time   LDLCALC 58 04/27/2019 09:26 AM   CHOL 116 04/27/2019 09:26 AM   TRIG 65.0 04/27/2019 09:26 AM   HDL 45.00 04/27/2019 09:26 AM       ASSESSMENT AND PLAN:    1. CAD s/p CABG: Symptomatically stable. Continue Lipitor.  Not on beta-blockers.  No aspirin as he is on Eliquis.  2. Essential [id number] pressure is elevated.  No  changes to therapy.  3. Hyperlipidemia: Lipids reviewed above.  Continue Lipitor 40 mg.  4.Permanent atrial fibrillation: Heart rate is controlled without AV nodal blocking agents indicative of conduction system disease. Anticoagulated with Eliquis. No changes.  5. [id number] diameter 5 cm on 07/04/19. Followed by vascular surgery.   Disposition: Follow up 6 months   Kate Sable, M.D., F.A.C.C.

## 2019-07-25 NOTE — Patient Instructions (Signed)
Medication Instructions: Your physician recommends that you continue on your current medications as directed. Please refer to the Current Medication list given to you today.   Labwork: none  Procedures/Testing: None  Follow-Up: 6 months with Physician Assistant  Any Additional Special Instructions Will Be Listed Below (If Applicable).     If you need a refill on your cardiac medications before your next appointment, please call your pharmacy.     Thank you for choosing [facility] !

## 2019-08-07 ENCOUNTER — Other Ambulatory Visit: Payer: Self-pay

## 2019-08-07 ENCOUNTER — Other Ambulatory Visit: Payer: Self-pay | Admitting: Cardiovascular Disease

## 2019-08-07 ENCOUNTER — Ambulatory Visit: Payer: Medicare Other | Admitting: Endocrinology

## 2019-08-07 MED ORDER — APIXABAN 5 MG PO TABS: 5.0000 mg | ORAL_TABLET | Freq: Two times a day (BID) | ORAL | 2 refills | Status: DC

## 2019-08-07 NOTE — Telephone Encounter (Signed)
reflled per request

## 2019-08-08 ENCOUNTER — Other Ambulatory Visit: Payer: Self-pay | Admitting: Vascular Surgery

## 2019-08-08 DIAGNOSIS — I714 Abdominal aortic aneurysm, without rupture, unspecified: Secondary | ICD-10-CM

## 2019-08-09 DIAGNOSIS — L570 Actinic keratosis: Secondary | ICD-10-CM | POA: Diagnosis not present

## 2019-08-14 ENCOUNTER — Ambulatory Visit: Payer: Medicare Other | Admitting: Endocrinology

## 2019-08-14 DIAGNOSIS — C61 Malignant neoplasm of prostate: Secondary | ICD-10-CM | POA: Diagnosis not present

## 2019-08-21 DIAGNOSIS — C61 Malignant neoplasm of prostate: Secondary | ICD-10-CM | POA: Diagnosis not present

## 2019-08-21 DIAGNOSIS — R3912 Poor urinary stream: Secondary | ICD-10-CM | POA: Diagnosis not present

## 2019-08-21 DIAGNOSIS — N471 Phimosis: Secondary | ICD-10-CM | POA: Diagnosis not present

## 2019-08-24 ENCOUNTER — Ambulatory Visit (INDEPENDENT_AMBULATORY_CARE_PROVIDER_SITE_OTHER): Payer: Medicare Other | Admitting: Endocrinology

## 2019-08-24 ENCOUNTER — Encounter: Payer: Self-pay | Admitting: Endocrinology

## 2019-08-24 ENCOUNTER — Other Ambulatory Visit: Payer: Self-pay

## 2019-08-24 VITALS — BP 130/64 | HR 71 | Wt 201.0 lb

## 2019-08-24 DIAGNOSIS — I25708 Atherosclerosis of coronary artery bypass graft(s), unspecified, with other forms of angina pectoris: Secondary | ICD-10-CM

## 2019-08-24 DIAGNOSIS — Z23 Encounter for immunization: Secondary | ICD-10-CM | POA: Diagnosis not present

## 2019-08-24 DIAGNOSIS — Z794 Long term (current) use of insulin: Secondary | ICD-10-CM | POA: Diagnosis not present

## 2019-08-24 DIAGNOSIS — E1165 Type 2 diabetes mellitus with hyperglycemia: Secondary | ICD-10-CM

## 2019-08-24 LAB — POCT GLYCOSYLATED HEMOGLOBIN (HGB A1C): Hemoglobin A1C: 6.1 % — AB (ref 4.0–5.6)

## 2019-08-24 NOTE — Progress Notes (Signed)
Patient ID: Steve Bailey, male   DOB: 1934/09/27, 85 y.o.   MRN: [id number]    Reason for Appointment:  follow-up of various problems  History of Present Illness    Diagnosis: Type 2 DIABETES MELITUS, date of diagnosis: 1995              History:     Insulin regimen: Humulin 70/30,  20 units  a.c. twice a day with pens      Oral hypoglycemic drugs: Metformin ER 1500 mg daily   His A1c has been mostly near the normal range and now 6.1  Current management, blood sugar patterns and problems:  He did not bring his monitor and not clear if he is checking at all  Also although his daughter is helping him with compliance he may not always consistently take his evening insulin before eating  However no hypoglycemia by history  For simplicity he takes 20 units insulin twice a day  His weight is slightly lower, he thinks he is eating smaller meals but more in the form of snacks throughout the day  Lab glucose pending  Previously was taking Actos for insulin resistance, he stopped taking this because of fear of side effects   Glucometer: Freestyle  Blood sugar readings not being checked and patient does not remember when he last did his blood sugars  Previous readings:   PRE-MEAL Fasting Lunch  6-9 PM Bedtime Overall  Glucose range:  122  86-140  52-188  81   Mean/median:    135   129   POST-MEAL PC Breakfast PC Lunch PC Dinner  Glucose range:     Mean/median:         Carbohydrate intake: at meals: Mostly with vegetables, also getting some lean meats like Chicken and pork Egg, bacon, grits in am   Physical activity: Somewhat active with farming activities  Weight history:  Wt Readings from Last 3 Encounters:  08/24/19 201 lb (91.2 kg)  07/25/19 197 lb (89.4 kg)  05/05/19 205 lb 9.6 oz (93.3 kg)      Lab Results  Component Value Date   HGBA1C 6.1 (A) 08/24/2019   HGBA1C 6.0 (A) 05/05/2019   HGBA1C 7.0 (H) 12/30/2018   Lab Results  Component  Value Date   MICROALBUR 15.0 (H) 12/30/2018   LDLCALC 58 04/27/2019   CREATININE 1.12 04/27/2019    Other active problems: See review of systems    Allergies as of 08/24/2019      Reactions   Cephalexin Nausea And Vomiting      Medication List       Accurate as of August 24, 2019  3:40 PM. If you have any questions, ask your nurse or doctor.        acetaminophen 500 MG tablet Commonly known as: TYLENOL Take 1,300 mg by mouth at bedtime.   alfuzosin 10 MG 24 hr tablet Commonly known as: UROXATRAL Take 10 mg by mouth at bedtime. Reported on 12/18/2015   amLODipine 10 MG tablet Commonly known as: NORVASC TAKE 1 TABLET (10 MG TOTAL) BY MOUTH DAILY.   apixaban 5 MG Tabs tablet Commonly known as: Eliquis Take 1 tablet (5 mg total) by mouth 2 (two) times daily.   atorvastatin 40 MG tablet Commonly known as: LIPITOR TAKE 1 TABLET BY MOUTH EVERY DAY   Cinnamon 500 MG capsule Take 500 mg by mouth 2 (two) times daily.   finasteride 5 MG tablet Commonly known as: PROSCAR Take 5 mg  by mouth every morning. Reported on 12/18/2015   glucose blood test strip Commonly known as: FREESTYLE LITE USE AS DIRECTED TWICE A DAY DX CODE:11.65   Insulin Isophane & Regular Human (70-30) 100 UNIT/ML PEN Commonly known as: HumuLIN 70/30 KwikPen INJECT 18 UNITS INTO THE SKIN EVERY MORNING & 22 UNITS AT SUPPER DAILY - NEEDS APPT What changed: additional instructions   lisinopril 20 MG tablet Commonly known as: ZESTRIL TAKE 1 TABLET BY MOUTH EVERY DAY   metFORMIN 750 MG 24 hr tablet Commonly known as: GLUCOPHAGE-XR TAKE 2 TABLETS BY MOUTH DAILY WITH BREAKFAST.   metoprolol succinate 25 MG 24 hr tablet Commonly known as: TOPROL-XL TAKE 1 TABLET BY MOUTH EVERY DAY   OMEGA-3 FISH OIL PO Take 1 capsule by mouth daily.   Pen Needles 31G X 5 MM Misc 1 each by Does not apply route 2 (two) times daily.   prednisoLONE acetate 1 % ophthalmic suspension Commonly known as: PRED FORTE  PLACE 1 DROP INTO THE RIGHT EYE DAILY.   traMADol 50 MG tablet Commonly known as: Ultram Take 1 tablet (50 mg total) by mouth every 6 (six) hours as needed for moderate pain. Post-operatively       Allergies:  Allergies  Allergen Reactions  . Cephalexin Nausea And Vomiting    Past Medical History:  Diagnosis Date  . AAA (abdominal aortic aneurysm) ([place]) followed by dr Bridgett Larsson   last duplex 06/28/2018 4.6 cm,  asymptomatic  . Anticoagulant long-term use    eliquis  . Benign localized prostatic hyperplasia with lower urinary tract symptoms (LUTS)   . CAD (coronary artery disease) cardiologist-  dr Bronson Ing   remote CABG in 1997; prior PCI to the LAD i n1990; Nuclear study in February of 2012 normal with an EF of 60%   . DDD (degenerative disc disease), cervical   . Full dentures   . History of radiation therapy 11/24/12-01/18/13   Prostate 78Gy/35fx  . HOH (hard of hearing)    both  . Hyperlipidemia   . Hyperlipidemia   . Hypertension   . Permanent atrial fibrillation ([place]) 09/2012   followed by dr Bronson Ing  . Phimosis    severe  . Proliferative diabetic retinopathy ([place])    both eyes  . Prostate cancer ) urologist-- dr Tresa Moore   dx 09-02-2012-- Stage T2a, Gleason 3+4=7, PSA 8.12, vol 103 cc--- completed external beam radiation 02/ 2014;  recurrent 2015 , started hormone therapy  . S/P CABG x 3 1997  . Strains to urinate   . Type 2 diabetes mellitus treated with insulin [facility])    endocrinologist-- dr Dwyane Dee    Past Surgical History:  Procedure Laterality Date  . CARDIAC CATHETERIZATION  01-18-2004   dr Doreatha Lew   patent grafts, ef 55%, minimal anterior hypokinesis,  severe total occlusion of LCFx and LAD,  severe disease of D2 and segmental RCA  . CARDIOVASCULAR STRESS TEST  01/07/2011   normal nuclear study w/ no ischemia/  normal LV function and wall motion,  ef 60%  . CARPAL TUNNEL RELEASE Right   . CATARACT EXTRACTION W/ INTRAOCULAR LENS  IMPLANT, BILATERAL  left  2006;  right 2010  . CIRCUMCISION N/A 07/28/2018   Procedure: CIRCUMCISION ADULT;  Surgeon: Alexis Frock, MD;  Location: [facility];  Service: Urology;  Laterality: N/A;  . 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 CORONARY ANGIOPLASTY  1990   LAD  . CORONARY ARTERY BYPASS GRAFT  11-1995   shows distal LAD disease, without recurrent  symptoms of angina with questionable mild apical ischemia but with normal EF   . ESOPHAGOGASTRODUODENOSCOPY (EGD) WITH ESOPHAGEAL DILATION  05/2012  . KNEE ARTHROSCOPY Left   . TRANSTHORACIC ECHOCARDIOGRAM  10/10/2012   ef 55%, akinesis of the inferolateral wall,  grade 2 diastolic dysfunction/  trivial AR/  mild MR and TR/  mild LAE/  mild RVSF reduced and mild dilated RV    Family History  Problem Relation Age of Onset  . Stroke Father   . Heart attack Mother   . Heart disease Mother   . Cancer Sister        lung  . Cancer Brother        prostate  . Diabetes Brother   . Stroke Brother   . Stroke Brother   . Heart disease Brother   . Heart disease Brother   . Heart attack Sister   . Heart disease Sister   . Heart attack Sister   . Cancer Sister        breast  . Heart disease Sister   . Heart attack Sister   . Heart disease Sister   . Heart disease Sister     Social History:  reports that he quit smoking about 39 years ago. His smoking use included cigarettes. He quit after 20.00 years of use. He quit smokeless tobacco use about 6 years ago.  His smokeless tobacco use included chew. He reports that he does not drink alcohol or use drugs.  ROS   HYPERTENSION:   Has been present for several years.    He has been treated with lisinopril 20 mg, amlodipine 10mg , also metoprolol 25 mg His amlodipine has been prescribed by cardiologist Also metoprolol was started because of a fast heart rate  He has an old [employer] monitor at home which he brought in but did not bring the cuff to compare Although his office blood pressure readings even  with his PCP are excellent with systolic around 123456 his home systolic readings are mostly around 150 with the highest reading 165/88   BP Readings from Last 3 Encounters:  08/24/19 130/64  07/25/19 (!) 148/86  05/05/19 120/60          HYPERLIPIDEMIA:    he has had high LDL at baseline and this has been well-controlled with 40 mg Lipitor Labs as follows:   Lab Results  Component Value Date   CHOL 116 04/27/2019   HDL 45.00 04/27/2019   [place] 58 04/27/2019   TRIG 65.0 04/27/2019   CHOLHDL 3 04/27/2019    Diabetic foot exam done in 2/20 showed normal monofilament sensation and absent pulses  He is being followed by vascular surgeon for aortic aneurysm  He is being followed regularly by [facility] ophthalmology, has corneal dystrophy  Last TSH normal in 10/19    Examination:   BP 130/64   Pulse 71   Wt 201 lb (91.2 kg)   SpO2 97%   BMI 27.26 kg/m   Body mass index is 27.26 kg/m.    Assesment/PLAN:  DIABETES, Type II on insulin:  See history of present illness for  discussion of current diabetes management and problems identified  His A1c is 6.1 % and stable  He has been on the same insulin dose along with metformin long-term without any need to change his regimen Also has consistent control despite his irregular eating habits, sometimes not taking evening insulin before his meal and variable activity level He is very inconsistent with checking blood sugars  and not doing this lately  His daughter was present in the office Discussed needing to check blood sugars at least every other day at various times Reminded him to take his evening insulin when he is starting to prepare his meals and not after Needs to avoid going long times during the day without eating   HYPERTENSION: Blood pressure is well controlled now He will need to get a new blood pressure monitor as his home monitor is reading falsely high   LIPIDS: Well controlled   Influenza vaccine given   There are no Patient Instructions on file for this visit.   Elayne Snare 08/24/2019, 3:40 PM

## 2019-08-25 LAB — COMPREHENSIVE METABOLIC PANEL
ALT: 14 U/L (ref 0–53)
AST: 17 U/L (ref 0–37)
Albumin: 4.2 g/dL (ref 3.5–5.2)
Alkaline Phosphatase: 80 U/L (ref 39–117)
BUN: 24 mg/dL — ABNORMAL HIGH (ref 6–23)
CO2: 28 mEq/L (ref 19–32)
Calcium: 9.4 mg/dL (ref 8.4–10.5)
Chloride: 104 mEq/L (ref 96–112)
Creatinine, Ser: 1.19 mg/dL (ref 0.40–1.50)
GFR: 58.34 mL/min — ABNORMAL LOW (ref >60.00)
Glucose, Bld: 154 mg/dL — ABNORMAL HIGH (ref 70–99)
Potassium: 4.8 mEq/L (ref 3.5–5.1)
Sodium: 140 mEq/L (ref 135–145)
Total Bilirubin: 0.6 mg/dL (ref 0.2–1.2)
Total Protein: 6.7 g/dL (ref 6.0–8.3)

## 2019-09-06 ENCOUNTER — Ambulatory Visit
Admission: RE | Admit: 2019-09-06 | Discharge: 2019-09-06 | Disposition: A | Discharge status: Home / Self Care | Payer: Medicare Other | Source: Ambulatory Visit | Attending: Vascular Surgery | Admitting: Vascular Surgery

## 2019-09-06 DIAGNOSIS — I714 Abdominal aortic aneurysm, without rupture, unspecified: Secondary | ICD-10-CM

## 2019-09-06 MED ORDER — IOPAMIDOL (ISOVUE-370) INJECTION 76%
75.0000 mL | Freq: Once | INTRAVENOUS | Status: AC | PRN
  Administered 2019-09-06: 75 mL via INTRAVENOUS

## 2019-09-12 ENCOUNTER — Ambulatory Visit (INDEPENDENT_AMBULATORY_CARE_PROVIDER_SITE_OTHER): Payer: Medicare Other | Admitting: Vascular Surgery

## 2019-09-12 ENCOUNTER — Encounter: Payer: Self-pay | Admitting: Vascular Surgery

## 2019-09-12 ENCOUNTER — Other Ambulatory Visit: Payer: Self-pay

## 2019-09-12 VITALS — BP 162/79 | HR 67 | Temp 97.3°F | Resp 18 | Ht 72.0 in | Wt 200.0 lb

## 2019-09-12 DIAGNOSIS — I714 Abdominal aortic aneurysm, without rupture, unspecified: Secondary | ICD-10-CM

## 2019-09-12 NOTE — Progress Notes (Signed)
Patient name: Steve Bailey MRN: [id number] DOB: 28-Jul-1934 Sex: male  REASON FOR VISIT: Follow-up after CTA for evaluation of known abdominal aortic aneurysm  HPI: Aleem Skillin is a 85 y.o. male with history of diabetes, hypertension, hyperlipidemia, prostate cancer, A. Fib on eliquis, known abdominal aortic aneurysm, coronary disease status post CABG that presents for follow-up after CTA to evaluate abdominal aortic aneurysm.  Patient has had a known aneurysm since 2017 when this was discovered incidentally on a CT after a fall.  At the time it was about 4.4 cm.  This has been followed by surveillance.  He reports no new abdominal or back pain.  No other significant changes to his history.  Still does not smoke and quit in the past.  States has had a CABG but no other abdominal surgery in the past. Based on duplex imaging aneurysm was 4.6 cm on 06/28/2018 again 4.6 cm on 01/03/2019 and then 5 cm on 07/04/2019. CTA abdomen/pelvis shows stable at 5 cm. Denies abdominal or back pain.   Past Medical History:  Diagnosis Date  . AAA (abdominal aortic aneurysm) ([place]) followed by dr Bridgett Larsson   last duplex 06/28/2018 4.6 cm,  asymptomatic  . Anticoagulant long-term use    eliquis  . Benign localized prostatic hyperplasia with lower urinary tract symptoms (LUTS)   . CAD (coronary artery disease) cardiologist-  dr Bronson Ing   remote CABG in 1997; prior PCI to the LAD i n1990; Nuclear study in February of 2012 normal with an EF of 60%   . DDD (degenerative disc disease), cervical   . Full dentures   . History of radiation therapy 11/24/12-01/18/13   Prostate 78Gy/21fx  . HOH (hard of hearing)    both  . Hyperlipidemia   . Hyperlipidemia   . Hypertension   . Permanent atrial fibrillation ([place]) 09/2012   followed by dr Bronson Ing  . Phimosis    severe  . Proliferative diabetic retinopathy ([place])    both eyes  . Prostate cancer [facility]) urologist-- dr Tresa Moore   dx 09-02-2012-- Stage T2a, Gleason 3+4=7, PSA  8.12, vol 103 cc--- completed external beam radiation 02/ 2014;  recurrent 2015 , started hormone therapy  . S/P CABG x 3 1997  . Strains to urinate   . Type 2 diabetes mellitus treated with insulin [facility])    endocrinologist-- dr Dwyane Dee    Past Surgical History:  Procedure Laterality Date  . CARDIAC CATHETERIZATION  01-18-2004   dr Doreatha Lew   patent grafts, ef 55%, minimal anterior hypokinesis,  severe total occlusion of LCFx and LAD,  severe disease of D2 and segmental RCA  . CARDIOVASCULAR STRESS TEST  01/07/2011   normal nuclear study w/ no ischemia/  normal LV function and wall motion,  ef 60%  . CARPAL TUNNEL RELEASE Right   . CATARACT EXTRACTION W/ INTRAOCULAR LENS  IMPLANT, BILATERAL  left 2006;  right 2010  . CIRCUMCISION N/A 07/28/2018   Procedure: CIRCUMCISION ADULT;  Surgeon: Alexis Frock, MD;  Location: [facility];  Service: Urology;  Laterality: N/A;  . 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 CORONARY ANGIOPLASTY  1990   LAD  . CORONARY ARTERY BYPASS GRAFT  11-1995   shows distal LAD disease, without recurrent symptoms of angina with questionable mild apical ischemia but with normal EF   . ESOPHAGOGASTRODUODENOSCOPY (EGD) WITH ESOPHAGEAL DILATION  05/2012  . KNEE ARTHROSCOPY Left   . TRANSTHORACIC ECHOCARDIOGRAM  10/10/2012   ef 55%, akinesis of the inferolateral wall,  grade 2  diastolic dysfunction/  trivial AR/  mild MR and TR/  mild LAE/  mild RVSF reduced and mild dilated RV    Family History  Problem Relation Age of Onset  . Stroke Father   . Heart attack Mother   . Heart disease Mother   . Cancer Sister        lung  . Cancer Brother        prostate  . Diabetes Brother   . Stroke Brother   . Stroke Brother   . Heart disease Brother   . Heart disease Brother   . Heart attack Sister   . Heart disease Sister   . Heart attack Sister   . Cancer Sister        breast  . Heart disease Sister   . Heart attack Sister   . Heart disease Sister   .  Heart disease Sister     SOCIAL HISTORY: Social History   Tobacco Use  . Smoking status: Former Smoker    Years: 20.00    Types: Cigarettes    Quit date: 10/14/1979    Years since quitting: 39.9  . Smokeless tobacco: Former Systems developer    Types: Chew    Quit date: 11/23/2012  Substance Use Topics  . Alcohol use: No    Allergies  Allergen Reactions  . Cephalexin Nausea And Vomiting    Current Outpatient Medications  Medication Sig Dispense Refill  . acetaminophen (TYLENOL) 500 MG tablet Take 1,300 mg by mouth at bedtime.     Marland Kitchen alfuzosin (UROXATRAL) 10 MG 24 hr tablet Take 10 mg by mouth at bedtime. Reported on 12/18/2015    . amLODipine (NORVASC) 10 MG tablet TAKE 1 TABLET (10 MG TOTAL) BY MOUTH DAILY. 90 tablet 3  . apixaban (ELIQUIS) 5 MG TABS tablet Take 1 tablet (5 mg total) by mouth 2 (two) times daily. 180 tablet 2  . atorvastatin (LIPITOR) 40 MG tablet TAKE 1 TABLET BY MOUTH EVERY DAY 90 tablet 3  . Cinnamon 500 MG capsule Take 500 mg by mouth 2 (two) times daily.    . finasteride (PROSCAR) 5 MG tablet Take 5 mg by mouth every morning. Reported on 12/18/2015    . glucose blood (FREESTYLE LITE) test strip USE AS DIRECTED TWICE A DAY DX CODE:11.65 100 each 1  . Insulin Isophane & Regular Human (HUMULIN 70/30 KWIKPEN) (70-30) 100 UNIT/ML PEN INJECT 18 UNITS INTO THE SKIN EVERY MORNING & 22 UNITS AT SUPPER DAILY - NEEDS APPT (Patient taking differently: INJECT 20 UNITS INTO THE SKIN EVERY MORNING & 20 UNITS AT SUPPER DAILY - NEEDS APPT) 15 pen 3  . Insulin Pen Needle (PEN NEEDLES) 31G X 5 MM MISC 1 each by Does not apply route 2 (two) times daily. 90 each 2  . lisinopril (PRINIVIL,ZESTRIL) 20 MG tablet TAKE 1 TABLET BY MOUTH EVERY DAY 90 tablet 1  . metFORMIN (GLUCOPHAGE-XR) 750 MG 24 hr tablet TAKE 2 TABLETS BY MOUTH DAILY WITH BREAKFAST. 180 tablet 1  . metoprolol succinate (TOPROL-XL) 25 MG 24 hr tablet TAKE 1 TABLET BY MOUTH EVERY DAY 90 tablet 1  . Omega-3 Fatty Acids (OMEGA-3 FISH  OIL PO) Take 1 capsule by mouth daily.    . prednisoLONE acetate (PRED FORTE) 1 % ophthalmic suspension PLACE 1 DROP INTO THE RIGHT EYE DAILY.    . traMADol (ULTRAM) 50 MG tablet Take 1 tablet (50 mg total) by mouth every 6 (six) hours as needed for moderate pain. Post-operatively 15 tablet 0  No current facility-administered medications for this visit.     REVIEW OF SYSTEMS:  [X]  denotes positive finding, [ ]  denotes negative finding Cardiac  Comments:  Chest pain or chest pressure:    Shortness of breath upon exertion:    Short of breath when lying flat:    Irregular heart rhythm:        Vascular    Pain in calf, thigh, or hip brought on by ambulation:    Pain in feet at night that wakes you up from your sleep:     Blood clot in your veins:    Leg swelling:         Pulmonary    Oxygen at home:    Productive cough:     Wheezing:         Neurologic    Sudden weakness in arms or legs:     Sudden numbness in arms or legs:     Sudden onset of difficulty speaking or slurred speech:    Temporary loss of vision in one eye:     Problems with dizziness:         Gastrointestinal    Blood in stool:     Vomited blood:         Genitourinary    Burning when urinating:     Blood in urine:        Psychiatric    Major depression:         Hematologic    Bleeding problems:    Problems with blood clotting too easily:        Skin    Rashes or ulcers:        Constitutional    Fever or chills:      PHYSICAL EXAM: Vitals:   09/12/19 1421  BP: (!) 162/79  Pulse: 67  Resp: 18  Temp: (!) 97.3 F (36.3 C)  TempSrc: Temporal  SpO2: 99%  Weight: 200 lb (90.7 kg)  Height: 6' (1.829 m)    GENERAL: The patient is a well-nourished male, in no acute distress. The vital signs are documented above. CARDIAC: There is a regular rate and rhythm.  VASCULAR:  2+ palpable radial pulse bilaterally 2+ palpable femoral pulse bilaterally PULMONARY: There is good air exchange bilaterally  without wheezing or rales. ABDOMEN: Soft and non-tender with normal pitched bowel sounds.  No pain with palpation of aneurysm. MUSCULOSKELETAL: There are no major deformities or cyanosis. NEUROLOGIC: No focal weakness or paresthesias are detected. SKIN: There are no ulcers or rashes noted. PSYCHIATRIC: The patient has a normal affect.  DATA:   I independently reviewed his CTA and I measure 5.0 cm maximal infrarenal aortic aneurysm diameter - little change since images this summer at 5 as well on duplex.  Assessment/Plan:  85 year old male has been followed for known abdominal aortic aneurysm.  This was 4.6 cm on duplex at the beginning of this year and then 5 cm on duplex in the summer.  He presents as a 51-month follow-up to see me from Vinnie Level the NP with CT.  This now measures 5.0 cm on CT which is unchanged.  Discussed the current guidelines are 5.5 cm unless there is rapid growth in the aneurysm or become symptomatic.  He remains asymptomatic at this time.  I recommended follow-up again in 6 months with another AAA duplex.   Marty Heck, MD [facility] Office: [phone] Pager: [phone]

## 2019-09-14 ENCOUNTER — Other Ambulatory Visit: Payer: Self-pay

## 2019-09-14 DIAGNOSIS — I714 Abdominal aortic aneurysm, without rupture, unspecified: Secondary | ICD-10-CM

## 2019-10-24 DIAGNOSIS — Z947 Corneal transplant status: Secondary | ICD-10-CM | POA: Diagnosis not present

## 2019-10-24 DIAGNOSIS — E113513 Type 2 diabetes mellitus with proliferative diabetic retinopathy with macular edema, bilateral: Secondary | ICD-10-CM | POA: Diagnosis not present

## 2019-10-24 DIAGNOSIS — H353221 Exudative age-related macular degeneration, left eye, with active choroidal neovascularization: Secondary | ICD-10-CM | POA: Diagnosis not present

## 2019-10-24 DIAGNOSIS — Z961 Presence of intraocular lens: Secondary | ICD-10-CM | POA: Diagnosis not present

## 2019-10-24 LAB — HM DIABETES EYE EXAM

## 2019-11-09 ENCOUNTER — Other Ambulatory Visit: Payer: Self-pay | Admitting: Endocrinology

## 2019-11-11 ENCOUNTER — Other Ambulatory Visit: Payer: Self-pay | Admitting: Endocrinology

## 2019-11-16 ENCOUNTER — Other Ambulatory Visit: Payer: Self-pay | Admitting: Endocrinology

## 2019-12-18 DIAGNOSIS — R5383 Other fatigue: Secondary | ICD-10-CM | POA: Diagnosis not present

## 2019-12-18 DIAGNOSIS — E119 Type 2 diabetes mellitus without complications: Secondary | ICD-10-CM | POA: Diagnosis not present

## 2019-12-18 DIAGNOSIS — U071 COVID-19: Secondary | ICD-10-CM | POA: Diagnosis not present

## 2019-12-18 DIAGNOSIS — R112 Nausea with vomiting, unspecified: Secondary | ICD-10-CM | POA: Diagnosis not present

## 2019-12-18 DIAGNOSIS — R05 Cough: Secondary | ICD-10-CM | POA: Diagnosis not present

## 2019-12-18 DIAGNOSIS — M791 Myalgia, unspecified site: Secondary | ICD-10-CM | POA: Diagnosis not present

## 2019-12-18 DIAGNOSIS — R638 Other symptoms and signs concerning food and fluid intake: Secondary | ICD-10-CM | POA: Diagnosis not present

## 2019-12-18 DIAGNOSIS — R531 Weakness: Secondary | ICD-10-CM | POA: Diagnosis not present

## 2019-12-18 DIAGNOSIS — R509 Fever, unspecified: Secondary | ICD-10-CM | POA: Diagnosis not present

## 2019-12-18 DIAGNOSIS — R197 Diarrhea, unspecified: Secondary | ICD-10-CM | POA: Diagnosis not present

## 2019-12-22 ENCOUNTER — Other Ambulatory Visit: Payer: Self-pay | Admitting: Urology

## 2019-12-22 ENCOUNTER — Other Ambulatory Visit (HOSPITAL_COMMUNITY): Payer: Self-pay | Admitting: Urology

## 2019-12-22 DIAGNOSIS — C61 Malignant neoplasm of prostate: Secondary | ICD-10-CM

## 2019-12-26 ENCOUNTER — Ambulatory Visit: Payer: BLUE CROSS/BLUE SHIELD | Admitting: Endocrinology

## 2020-01-15 ENCOUNTER — Other Ambulatory Visit: Payer: Self-pay

## 2020-01-15 ENCOUNTER — Encounter: Payer: Self-pay | Admitting: Endocrinology

## 2020-01-15 ENCOUNTER — Ambulatory Visit (INDEPENDENT_AMBULATORY_CARE_PROVIDER_SITE_OTHER): Payer: Medicare Other | Admitting: Endocrinology

## 2020-01-15 VITALS — BP 142/68 | HR 63 | Ht 72.0 in | Wt 199.8 lb

## 2020-01-15 DIAGNOSIS — E1165 Type 2 diabetes mellitus with hyperglycemia: Secondary | ICD-10-CM | POA: Diagnosis not present

## 2020-01-15 DIAGNOSIS — E119 Type 2 diabetes mellitus without complications: Secondary | ICD-10-CM | POA: Diagnosis not present

## 2020-01-15 DIAGNOSIS — Z794 Long term (current) use of insulin: Secondary | ICD-10-CM

## 2020-01-15 LAB — POCT GLYCOSYLATED HEMOGLOBIN (HGB A1C): Hemoglobin A1C: 6 % — AB (ref 4.0–5.6)

## 2020-01-15 LAB — GLUCOSE, POCT (MANUAL RESULT ENTRY): POC Glucose: 157 mg/dl — AB (ref 70–99)

## 2020-01-15 NOTE — Progress Notes (Signed)
Patient ID: Steve Bailey, male   DOB: October 26, 1934, 86 y.o.   MRN: [id number]    Reason for Appointment:  follow-up of various problems  History of Present Illness    Diagnosis: Type 2 DIABETES MELITUS, date of diagnosis: 1995              History:     Insulin regimen: Humulin 70/30 KwikPen,  20 units  a.c. twice a day   Oral hypoglycemic drugs: Metformin ER 750 mg, 2 tabs daily   His A1c has been consistently in the normal range and now 6, previously 6.1  Current management, blood sugar patterns and problems:  He did not bring his monitor  His daughter said that she has seen him checking blood sugar sometimes and mostly in the evenings  However not clear what his blood sugars are  He does not think he has had any low blood sugar episodes  Appetite has been variable especially with gastroenteritis last month, however generally eating smaller portions  With cold weather he does not do any physical activities or walking  His history is difficult to obtain but he still thinks he is trying to take his insulin before starting to eat twice a day  Also taking Metformin regularly  Glucometer: Freestyle  Blood sugar readings by recall at different times of the day in the range of 112-170    Carbohydrate intake: at meals: Mostly with vegetables, also getting some lean meats like Chicken and pork Egg, bacon, grits in am    Weight history:  Wt Readings from Last 3 Encounters:  01/15/20 199 lb 12.8 oz (90.6 kg)  09/12/19 200 lb (90.7 kg)  08/24/19 201 lb (91.2 kg)      Lab Results  Component Value Date   HGBA1C 6.0 (A) 01/15/2020   HGBA1C 6.1 (A) 08/24/2019   HGBA1C 6.0 (A) 05/05/2019   Lab Results  Component Value Date   MICROALBUR 15.0 (H) 12/30/2018   LDLCALC 58 04/27/2019   CREATININE 1.19 08/24/2019    Other active problems: See review of systems    Allergies as of 01/15/2020      Reactions   Cephalexin Nausea And Vomiting      Medication  List       Accurate as of January 15, 2020  4:04 PM. If you have any questions, ask your nurse or doctor.        acetaminophen 500 MG tablet Commonly known as: TYLENOL Take 1,300 mg by mouth at bedtime.   alfuzosin 10 MG 24 hr tablet Commonly known as: UROXATRAL Take 10 mg by mouth at bedtime. Reported on 12/18/2015   amLODipine 10 MG tablet Commonly known as: NORVASC TAKE 1 TABLET (10 MG TOTAL) BY MOUTH DAILY.   apixaban 5 MG Tabs tablet Commonly known as: Eliquis Take 1 tablet (5 mg total) by mouth 2 (two) times daily.   atorvastatin 40 MG tablet Commonly known as: LIPITOR TAKE 1 TABLET BY MOUTH EVERY DAY   Cinnamon 500 MG capsule Take 500 mg by mouth 2 (two) times daily.   finasteride 5 MG tablet Commonly known as: PROSCAR Take 5 mg by mouth every morning. Reported on 12/18/2015   glucose blood test strip Commonly known as: FREESTYLE LITE USE AS DIRECTED TWICE A DAY DX CODE:11.65   HumuLIN 70/30 KwikPen (70-30) 100 UNIT/ML PEN Generic drug: Insulin Isophane & Regular Human Inject 20 Units into the skin in the morning and at bedtime. What changed: Another medication with the  same name was removed. Continue taking this medication, and follow the directions you see here. Changed by: Elayne Snare, MD   lisinopril 20 MG tablet Commonly known as: ZESTRIL TAKE 1 TABLET BY MOUTH EVERY DAY   metFORMIN 750 MG 24 hr tablet Commonly known as: GLUCOPHAGE-XR TAKE 2 TABLETS BY MOUTH DAILY WITH BREAKFAST.   metoprolol succinate 25 MG 24 hr tablet Commonly known as: TOPROL-XL TAKE 1 TABLET BY MOUTH EVERY DAY   OMEGA-3 FISH OIL PO Take 1 capsule by mouth daily.   Pen Needles 31G X 5 MM Misc 1 each by Does not apply route 2 (two) times daily.   prednisoLONE acetate 1 % ophthalmic suspension Commonly known as: PRED FORTE PLACE 1 DROP INTO THE RIGHT EYE DAILY.   traMADol 50 MG tablet Commonly known as: Ultram Take 1 tablet (50 mg total) by mouth every 6 (six) hours as  needed for moderate pain. Post-operatively       Allergies:  Allergies  Allergen Reactions  . Cephalexin Nausea And Vomiting    Past Medical History:  Diagnosis Date  . AAA (abdominal aortic aneurysm) ([place]) followed by dr Bridgett Larsson   last duplex 06/28/2018 4.6 cm,  asymptomatic  . Anticoagulant long-term use    eliquis  . Benign localized prostatic hyperplasia with lower urinary tract symptoms (LUTS)   . CAD (coronary artery disease) cardiologist-  dr Bronson Ing   remote CABG in 1997; prior PCI to the LAD i n1990; Nuclear study in February of 2012 normal with an EF of 60%   . DDD (degenerative disc disease), cervical   . Full dentures   . History of radiation therapy 11/24/12-01/18/13   Prostate 78Gy/1fx  . HOH (hard of hearing)    both  . Hyperlipidemia   . Hyperlipidemia   . Hypertension   . Permanent atrial fibrillation ([place]) 09/2012   followed by dr Bronson Ing  . Phimosis    severe  . Proliferative diabetic retinopathy ([place])    both eyes  . Prostate cancer [facility]) urologist-- dr Tresa Moore   dx 09-02-2012-- Stage T2a, Gleason 3+4=7, PSA 8.12, vol 103 cc--- completed external beam radiation 02/ 2014;  recurrent 2015 , started hormone therapy  . S/P CABG x 3 1997  . Strains to urinate   . Type 2 diabetes mellitus treated with insulin [facility])    endocrinologist-- dr Dwyane Dee    Past Surgical History:  Procedure Laterality Date  . CARDIAC CATHETERIZATION  01-18-2004   dr Doreatha Lew   patent grafts, ef 55%, minimal anterior hypokinesis,  severe total occlusion of LCFx and LAD,  severe disease of D2 and segmental RCA  . CARDIOVASCULAR STRESS TEST  01/07/2011   normal nuclear study w/ no ischemia/  normal LV function and wall motion,  ef 60%  . CARPAL TUNNEL RELEASE Right   . CATARACT EXTRACTION W/ INTRAOCULAR LENS  IMPLANT, BILATERAL  left 2006;  right 2010  . CIRCUMCISION N/A 07/28/2018   Procedure: CIRCUMCISION ADULT;  Surgeon: Alexis Frock, MD;  Location: [facility];   Service: Urology;  Laterality: N/A;  . 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 CORONARY ANGIOPLASTY  1990   LAD  . CORONARY ARTERY BYPASS GRAFT  11-1995   shows distal LAD disease, without recurrent symptoms of angina with questionable mild apical ischemia but with normal EF   . ESOPHAGOGASTRODUODENOSCOPY (EGD) WITH ESOPHAGEAL DILATION  05/2012  . KNEE ARTHROSCOPY Left   . TRANSTHORACIC ECHOCARDIOGRAM  10/10/2012   ef 55%, akinesis of the inferolateral wall,  grade 2 diastolic dysfunction/  trivial AR/  mild MR and TR/  mild LAE/  mild RVSF reduced and mild dilated RV    Family History  Problem Relation Age of Onset  . Stroke Father   . Heart attack Mother   . Heart disease Mother   . Cancer Sister        lung  . Cancer Brother        prostate  . Diabetes Brother   . Stroke Brother   . Stroke Brother   . Heart disease Brother   . Heart disease Brother   . Heart attack Sister   . Heart disease Sister   . Heart attack Sister   . Cancer Sister        breast  . Heart disease Sister   . Heart attack Sister   . Heart disease Sister   . Heart disease Sister     Social History:  reports that he quit smoking about 40 years ago. His smoking use included cigarettes. He quit after 20.00 years of use. He quit smokeless tobacco use about 7 years ago.  His smokeless tobacco use included chew. He reports that he does not drink alcohol or use drugs.  ROS   HYPERTENSION:   Has been present for several years.    He has been treated with lisinopril 20 mg, amlodipine 10mg , and metoprolol 25 mg His amlodipine has been prescribed by cardiologist Also metoprolol was started because of a fast heart rate  He has an old [employer] monitor at home which he was told to upgrade but he has not done so This was on his last visit reading falsely higher at home   BP Readings from Last 3 Encounters:  01/15/20 (!) 142/68  09/12/19 (!) 162/79  08/24/19 130/64          HYPERLIPIDEMIA:    he has had high  LDL at baseline and this has been well-controlled with 40 mg Lipitor prescribed by his cardiologist Labs as follows:   Lab Results  Component Value Date   CHOL 116 04/27/2019   HDL 45.00 04/27/2019   [place] 58 04/27/2019   TRIG 65.0 04/27/2019   CHOLHDL 3 04/27/2019    Diabetic foot exam done in 2/20 showed normal monofilament sensation and absent pulses  He is being followed by vascular surgeon for aortic aneurysm  He is being followed regularly by [facility] ophthalmology, has corneal dystrophy and macular edema  Last TSH normal in 10/19  Apparently had symptoms of gastroenteritis and was also tested positive for Covid last month    Examination:   BP (!) 142/68 (BP Location: Left Arm, Patient Position: Sitting, Cuff Size: Normal)   Pulse 63   Ht 6' (1.829 m)   Wt 199 lb 12.8 oz (90.6 kg)   SpO2 97%   BMI 27.10 kg/m   Body mass index is 27.1 kg/m.    Assesment/PLAN:  DIABETES, Type II on insulin:  See history of present illness for  discussion of current diabetes management and problems identified  His A1c is 6%, previously 6.1 % and consistent  He has been on the same dose of premixed insulin twice a day along with metformin long-term Again difficult to know what his blood sugar readings are as he either does not monitor much or does not bring his monitor for review He has difficulty remembering his blood sugars with readings recently apparently mostly in the 100+ range up to 170 No hypoglycemia His daughter is  helping him watch his diet and make sure he is taking his insulin and checking blood sugars periodically Since A1c is excellent and random blood sugar this afternoon is excellent at 157 will continue same regimen Discussed blood sugar targets both after meals and before breakfast Reminded him to take his insulin before starting to eat   HYPERTENSION: Blood pressure is consistently controlled now Again reminded him that he will need to get a new blood  pressure monitor as his home monitor is reading falsely high We will check labs to include chemistry panel and microalbumin today  LIPIDS: He needs to have follow-up with his cardiologist, last lipid panel was in 6/20  Diabetic retinopathy: He is having mostly issues with macular edema and corneal dystrophy followed by his ophthalmologists regularly  Follow-up in 4 months   There are no Patient Instructions on file for this visit.   Elayne Snare 01/15/2020, 4:04 PM

## 2020-01-15 NOTE — Patient Instructions (Signed)
Check blood sugars on waking up 2 days a week  Also check blood sugars about 2 hours after meals and do this after different meals by rotation  Recommended blood sugar levels on waking up are 90-130 and about 2 hours after meal is 130-170  Please bring your blood sugar monitor to each visit, thank you

## 2020-01-16 LAB — COMPREHENSIVE METABOLIC PANEL
ALT: 13 U/L (ref 0–53)
AST: 17 U/L (ref 0–37)
Albumin: 4 g/dL (ref 3.5–5.2)
Alkaline Phosphatase: 65 U/L (ref 39–117)
BUN: 19 mg/dL (ref 6–23)
CO2: 26 mEq/L (ref 19–32)
Calcium: 9.3 mg/dL (ref 8.4–10.5)
Chloride: 104 mEq/L (ref 96–112)
Creatinine, Ser: 1.07 mg/dL (ref 0.40–1.50)
GFR: 65.89 mL/min (ref >60.00)
Glucose, Bld: 168 mg/dL — ABNORMAL HIGH (ref 70–99)
Potassium: 4.3 mEq/L (ref 3.5–5.1)
Sodium: 138 mEq/L (ref 135–145)
Total Bilirubin: 0.6 mg/dL (ref 0.2–1.2)
Total Protein: 6.6 g/dL (ref 6.0–8.3)

## 2020-01-16 LAB — LIPID PANEL
Cholesterol: 150 mg/dL (ref 0–200)
HDL: 50 mg/dL (ref >39.00)
LDL Cholesterol: 81 mg/dL (ref 0–99)
NonHDL: 99.71
Total CHOL/HDL Ratio: 3
Triglycerides: 93 mg/dL (ref 0.0–149.0)
VLDL: 18.6 mg/dL (ref 0.0–40.0)

## 2020-01-16 LAB — MICROALBUMIN / CREATININE URINE RATIO
Creatinine,U: 227.9 mg/dL
Microalb Creat Ratio: 6.4 mg/g (ref 0.0–30.0)
Microalb, Ur: 14.7 mg/dL — ABNORMAL HIGH (ref 0.0–1.9)

## 2020-01-16 LAB — URINALYSIS, ROUTINE W REFLEX MICROSCOPIC
Bilirubin Urine: NEGATIVE
Hgb urine dipstick: NEGATIVE
Leukocytes,Ua: NEGATIVE
Nitrite: NEGATIVE
RBC / HPF: NONE SEEN (ref 0–?)
Specific Gravity, Urine: >=1.03 — AB (ref 1.000–1.030)
Total Protein, Urine: 30 — AB
Urine Glucose: NEGATIVE
Urobilinogen, UA: 1 (ref 0.0–1.0)
pH: 5.5 (ref 5.0–8.0)

## 2020-01-22 ENCOUNTER — Other Ambulatory Visit: Payer: Self-pay

## 2020-01-22 MED ORDER — ATORVASTATIN CALCIUM 40 MG PO TABS: 40.0000 mg | ORAL_TABLET | Freq: Every day | ORAL | 3 refills | Status: DC

## 2020-01-22 NOTE — Progress Notes (Signed)
Cardiology Office Note    Date:  01/23/2020   ID:  Steve Bailey, DOB [birth year], MRN [id number]  PCP:  Pieter Partridge, PA  Cardiologist: Kate Sable, MD    Chief Complaint  Patient presents with  . Follow-up    6 month visit    History of Present Illness:    Steve Bailey is a 86 y.o. male with past medical history of CAD (s/p CABG in 1997, patent grafts by cath in 2005, low-risk NST in 2012), permanent atrial fibrillation, HTN, HLD, IDDM and AAA (at 5.0 cm by imaging in 06/2019 and 08/2019) who presents to the office today for 32-month follow-up.  He was last examined by Dr. Bronson Ing in 07/2019 and denied any recent chest pain, dyspnea or palpitations at that time. Was active at baseline in mowing over 3 acres. He was continued on his current cardiac medication regimen including Amlodipine 10mg  daily, Atorvastatin 40mg  daily, Eliquis 5mg  BID, Lisinopril 20mg  daily and Toprol-XL 25mg  daily.    In talking with the patient and his daughter today, he reports overall doing well since his last visit. He has been less active secondary to the weather but prior to this he was mowing the yard regularly and tending to a large garden. He reports baseline dyspnea when walking up inclines but denies any acute changes in this. No recent chest pain or palpitations. He denies any orthopnea, PND or edema.   He remains on Eliquis for anticoagulation and denies any evidence of active bleeding. His daughter reports he was previously consuming frozen biscuits on a daily basis but after his recent labs, she took these away and he has been trying to consume healthier options.   Past Medical History:  Diagnosis Date  . AAA (abdominal aortic aneurysm) ([place]) followed by dr Bridgett Larsson   last duplex 06/28/2018 4.6 cm,  asymptomatic  . Anticoagulant long-term use    eliquis  . Benign localized prostatic hyperplasia with lower urinary tract symptoms (LUTS)   . CAD (coronary artery disease) cardiologist-  dr  Bronson Ing   remote CABG in 1997; prior PCI to the LAD i n1990; Nuclear study in February of 2012 normal with an EF of 60%   . DDD (degenerative disc disease), cervical   . Full dentures   . History of radiation therapy 11/24/12-01/18/13   Prostate 78Gy/60fx  . HOH (hard of hearing)    both  . Hyperlipidemia   . Hyperlipidemia   . Hypertension   . Permanent atrial fibrillation ([place]) 09/2012   followed by dr Bronson Ing  . Phimosis    severe  . Proliferative diabetic retinopathy ([place])    both eyes  . Prostate cancer [facility]) urologist-- dr Tresa Moore   dx 09-02-2012-- Stage T2a, Gleason 3+4=7, PSA 8.12, vol 103 cc--- completed external beam radiation 02/ 2014;  recurrent 2015 , started hormone therapy  . S/P CABG x 3 1997  . Strains to urinate   . Type 2 diabetes mellitus treated with insulin [facility])    endocrinologist-- dr Dwyane Dee    Past Surgical History:  Procedure Laterality Date  . CARDIAC CATHETERIZATION  01-18-2004   dr Doreatha Lew   patent grafts, ef 55%, minimal anterior hypokinesis,  severe total occlusion of LCFx and LAD,  severe disease of D2 and segmental RCA  . CARDIOVASCULAR STRESS TEST  01/07/2011   normal nuclear study w/ no ischemia/  normal LV function and wall motion,  ef 60%  . CARPAL TUNNEL RELEASE Right   . CATARACT EXTRACTION W/ INTRAOCULAR  LENS  IMPLANT, BILATERAL  left 2006;  right 2010  . CIRCUMCISION N/A 07/28/2018   Procedure: CIRCUMCISION ADULT;  Surgeon: Alexis Frock, MD;  [facility];  Service: Urology;  Laterality: N/A;  . 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 CORONARY ANGIOPLASTY  1990   LAD  . CORONARY ARTERY BYPASS GRAFT  11-1995   shows distal LAD disease, without recurrent symptoms of angina with questionable mild apical ischemia but with normal EF   . ESOPHAGOGASTRODUODENOSCOPY (EGD) WITH ESOPHAGEAL DILATION  05/2012  . KNEE ARTHROSCOPY Left   . TRANSTHORACIC ECHOCARDIOGRAM  10/10/2012   ef 55%, akinesis of the inferolateral wall,   grade 2 diastolic dysfunction/  trivial AR/  mild MR and TR/  mild LAE/  mild RVSF reduced and mild dilated RV    Current Medications: Outpatient Medications Prior to Visit  Medication Sig Dispense Refill  . acetaminophen (TYLENOL) 500 MG tablet Take 1,300 mg by mouth at bedtime.     Marland Kitchen alfuzosin (UROXATRAL) 10 MG 24 hr tablet Take 10 mg by mouth at bedtime. Reported on 12/18/2015    . amLODipine (NORVASC) 10 MG tablet TAKE 1 TABLET (10 MG TOTAL) BY MOUTH DAILY. 90 tablet 3  . apixaban (ELIQUIS) 5 MG TABS tablet Take 1 tablet (5 mg total) by mouth 2 (two) times daily. 180 tablet 2  . atorvastatin (LIPITOR) 40 MG tablet Take 1 tablet (40 mg total) by mouth daily. 90 tablet 3  . Cinnamon 500 MG capsule Take 500 mg by mouth 2 (two) times daily.    . finasteride (PROSCAR) 5 MG tablet Take 5 mg by mouth every morning. Reported on 12/18/2015    . glucose blood (FREESTYLE LITE) test strip USE AS DIRECTED TWICE A DAY DX CODE:11.65 100 each 1  . Insulin Isophane & Regular Human (HUMULIN 70/30 KWIKPEN) (70-30) 100 UNIT/ML PEN Inject 20 Units into the skin in the morning and at bedtime.    . Insulin Pen Needle (PEN NEEDLES) 31G X 5 MM MISC 1 each by Does not apply route 2 (two) times daily. 90 each 2  . lisinopril (ZESTRIL) 20 MG tablet TAKE 1 TABLET BY MOUTH EVERY DAY 90 tablet 1  . metFORMIN (GLUCOPHAGE-XR) 750 MG 24 hr tablet TAKE 2 TABLETS BY MOUTH DAILY WITH BREAKFAST. 180 tablet 1  . metoprolol succinate (TOPROL-XL) 25 MG 24 hr tablet TAKE 1 TABLET BY MOUTH EVERY DAY 90 tablet 1  . Omega-3 Fatty Acids (OMEGA-3 FISH OIL PO) Take 1 capsule by mouth daily.    . prednisoLONE acetate (PRED FORTE) 1 % ophthalmic suspension PLACE 1 DROP INTO THE RIGHT EYE DAILY.    . traMADol (ULTRAM) 50 MG tablet Take 1 tablet (50 mg total) by mouth every 6 (six) hours as needed for moderate pain. Post-operatively 15 tablet 0   No facility-administered medications prior to visit.     Allergies:   Cephalexin   Social  History   Socioeconomic History  . Marital status: Widowed    Spouse name: Not on file  . Number of children: Not on file  . Years of education: Not on file  . Highest education level: Not on file  Occupational History  . Not on file  Tobacco Use  . Smoking status: Former Smoker    Years: 20.00    Types: Cigarettes    Quit date: 10/14/1979    Years since quitting: 40.3  . Smokeless tobacco: Former Systems developer    Types: Chew    Quit date: 11/23/2012  Substance and Sexual Activity  . Alcohol use: No  . Drug use: No  . Sexual activity: Not on file  Other Topics Concern  . Not on file  Social History Narrative  . Not on file   Social Determinants of Health   Financial Resource Strain:   . Difficulty of Paying Living Expenses: Not on file  Food Insecurity:   . Worried About Charity fundraiser in the Last Year: Not on file  . Ran Out of Food in the Last Year: Not on file  Transportation Needs:   . Lack of Transportation (Medical): Not on file  . Lack of Transportation (Non-Medical): Not on file  Physical Activity:   . Days of Exercise per Week: Not on file  . Minutes of Exercise per Session: Not on file  Stress:   . Feeling of Stress : Not on file  Social Connections:   . Frequency of Communication with Friends and Family: Not on file  . Frequency of Social Gatherings with Friends and Family: Not on file  . Attends Religious Services: Not on file  . Active Member of Clubs or Organizations: Not on file  . Attends Archivist Meetings: Not on file  . Marital Status: Not on file     Family History:  The patient's family history includes Cancer in his brother, sister, and sister; Diabetes in his brother; Heart attack in his mother, sister, sister, and sister; Heart disease in his brother, brother, mother, sister, sister, sister, and sister; Stroke in his brother, brother, and father.   Review of Systems:   Please see the history of present illness.     General:  No  chills, fever, night sweats or weight changes.  Cardiovascular:  No chest pain, edema, orthopnea, palpitations, paroxysmal nocturnal dyspnea. Positive for dyspnea on exertion (stable).  Dermatological: No rash, lesions/masses Respiratory: No cough, dyspnea Urologic: No hematuria, dysuria Abdominal:   No nausea, vomiting, diarrhea, bright red blood per rectum, melena, or hematemesis Neurologic:  No visual changes, wkns, changes in mental status. All other systems reviewed and are otherwise negative except as noted above.   Physical Exam:    VS:  BP (!) 158/82   Pulse 67   Temp (!) 97.5 F (36.4 C)   Ht 6' (1.829 m)   Wt 198 lb (89.8 kg)   SpO2 98%   BMI 26.85 kg/m    General: Well developed, well nourished,male appearing in no acute distress. Head: Normocephalic, atraumatic, sclera non-icteric.  Neck: No carotid bruits. JVD not elevated.  Lungs: Respirations regular and unlabored, without wheezes or rales.  Heart: Irregularly irregular. No S3 or S4.  No murmur, no rubs, or gallops appreciated. Abdomen: Soft, non-tender, non-distended. No obvious abdominal masses. Msk:  Strength and tone appear normal for age. No obvious joint deformities or effusions. Extremities: No clubbing or cyanosis. Trace lower extremity edema.  Distal pedal pulses are 2+ bilaterally. Neuro: Alert and oriented X 3. Moves all extremities spontaneously. No focal deficits noted. Psych:  Responds to questions appropriately with a normal affect. Skin: No rashes or lesions noted  Wt Readings from Last 3 Encounters:  01/23/20 198 lb (89.8 kg)  01/15/20 199 lb 12.8 oz (90.6 kg)  09/12/19 200 lb (90.7 kg)     Studies/Labs Reviewed:   EKG:  EKG is ordered today.  The ekg ordered today demonstrates atrial fibrillation, HR 57 with TWI along Lead III which is similar to prior tracings.   Recent Labs: 01/15/2020: ALT 13;  BUN 19; Creatinine, Ser 1.07; Potassium 4.3; Sodium 138   Lipid Panel    Component Value  Date/Time   CHOL 150 01/15/2020 1618   TRIG 93.0 01/15/2020 1618   HDL 50.00 01/15/2020 1618   CHOLHDL 3 01/15/2020 1618   VLDL 18.6 01/15/2020 1618   LDLCALC 81 01/15/2020 1618    Additional studies/ records that were reviewed today include:   Echocardiogram: 2013 Study Conclusions   - Left ventricle: Upper septal thickening. There is akinesis  of the inferolateral wall with suggestion of some  calcification of this wall. The cavity size was normal.  The estimated ejection fraction was 55%. Features are  consistent with a pseudonormal left ventricular filling  pattern, with concomitant abnormal relaxation and  increased filling pressure (grade 2 diastolic  dysfunction). Doppler parameters are consistent with high  ventricular filling pressure.  - Aortic valve: Trivial regurgitation.  - Aorta: Slight dilitation of aortic root. Ascending aorta  not dilated. Aortic root dimension: 42mm (ED). Ascending  aortic diameter: 77mm (S).  - Mitral valve: Mild regurgitation.  - Left atrium: The atrium was mildly dilated.  - Right ventricle: The cavity size was mildly dilated.  Systolic function was mildly reduced.  - Pulmonary arteries: PA peak pressure: 28mm Hg (S).  AAA [place]: 06/2019 Summary:  Abdominal Aorta: There is evidence of abnormal dilitation of the Mid  Abdominal aorta. The largest aortic measurement is 5.0 cm. The largest  aortic diameter has increased compared to prior exam. Previous diameter  measurement was 4.6 cm obtained on  01/03/2019.   Assessment:    1. Coronary artery disease of bypass graft of native heart with stable angina pectoris ([place])   2. Permanent atrial fibrillation ([place])   3. AAA (abdominal aortic aneurysm) without rupture ([place])   4. Essential hypertension   5. Hyperlipidemia LDL goal <70      Plan:   In order of problems listed above:  1. CAD - he is s/p CABG in 1997 with patent grafts by cath in 2005 and low-risk NST in  2012. He has baseline dyspnea on exertion when walking up inclines but this has been stable and he denies any chest pain.  - continue Toprol-XL 25mg  daily and Atorvastatin 40mg  daily. He is not on ASA given the need for anticoagulation. Would anticipate a repeat NST next year given it will be 10 years since his last ischemic evaluation.   2. Permanent Atrial Fibrillation - he denies any palpitations and HR is in the 50's to 60's during today's visit. Continue Toprol-XL 25mg  daily for rate-control. - he denies any evidence of active bleeding. Continue Eliquis 5mg  BID for anticoagulation. Only indication for reduced dosing at this time is age as creatinine was stable at 1.07 by recent labs last month.  3. AAA - followed by Vascular Surgery. AAA at 5.0 cm by imaging in 06/2019 and 08/2019. He is scheduled for repeat imaging next month.   4. HTN - BP initially elevated at 160/76, at 158/82 on repeat check. He has been under increased stress today and I gave him a BP log and asked him to return this in several weeks. If BP remains above goal, would titrate Lisinopril to 40mg  daily. Would not further titrate Toprol-XL given HR in the 50's. Continue Amlodipine 10mg  daily.   5. HLD - LDL previously 58 last year, elevated to 81 on most recent check. His daughter is now helping him focus on dietary changes. Continue current regimen for now with Atorvastatin 40mg  daily.  Medication Adjustments/Labs and Tests Ordered: Current medicines are reviewed at length with the patient today.  Concerns regarding medicines are outlined above.  Medication changes, Labs and Tests ordered today are listed in the Patient Instructions below. Patient Instructions  Medication Instructions:  Your physician recommends that you continue on your current medications as directed. Please refer to the Current Medication list given to you today.  *If you need a refill on your cardiac medications before your next appointment,  please call your pharmacy*   Lab Work: NONE   If you have labs (blood work) drawn today and your tests are completely normal, you will receive your results only by: Marland Kitchen MyChart Message (if you have MyChart) OR . A paper copy in the mail If you have any lab test that is abnormal or we need to change your treatment, we will call you to review the results.   Testing/Procedures: NONE    Follow-Up: At [facility], you and your health needs are our priority.  As part of our continuing mission to provide you with exceptional heart care, we have created designated Provider Care Teams.  These Care Teams include your primary Cardiologist (physician) and Advanced Practice Providers (APPs -  Physician Assistants and Nurse Practitioners) who all work together to provide you with the care you need, when you need it.  We recommend signing up for the patient portal called "MyChart".  Sign up information is provided on this After Visit Summary.  MyChart is used to connect with patients for Virtual Visits (Telemedicine).  Patients are able to view lab/test results, encounter notes, upcoming appointments, etc.  Non-urgent messages can be sent to your provider as well.   To learn more about what you can do with MyChart, go to [email or web address].    Your next appointment:   6 month(s)  The format for your next appointment:   In Person  Provider:   Kate Sable, MD   Other Instructions Thank you for choosing [facility]!       Signed, Erma Heritage, PA-C  01/23/2020 7:45 PM    [place] [address] [place], [place] [address] Phone: [phone] Fax: [phone]

## 2020-01-22 NOTE — Telephone Encounter (Signed)
Refilled atorvastatin

## 2020-01-23 ENCOUNTER — Encounter: Payer: Self-pay | Admitting: Student

## 2020-01-23 ENCOUNTER — Other Ambulatory Visit: Payer: Self-pay

## 2020-01-23 ENCOUNTER — Ambulatory Visit (INDEPENDENT_AMBULATORY_CARE_PROVIDER_SITE_OTHER): Payer: Medicare Other | Admitting: Student

## 2020-01-23 VITALS — BP 158/82 | HR 67 | Temp 97.5°F | Ht 72.0 in | Wt 198.0 lb

## 2020-01-23 DIAGNOSIS — I25708 Atherosclerosis of coronary artery bypass graft(s), unspecified, with other forms of angina pectoris: Secondary | ICD-10-CM | POA: Diagnosis not present

## 2020-01-23 DIAGNOSIS — E785 Hyperlipidemia, unspecified: Secondary | ICD-10-CM

## 2020-01-23 DIAGNOSIS — I4821 Permanent atrial fibrillation: Secondary | ICD-10-CM

## 2020-01-23 DIAGNOSIS — Z947 Corneal transplant status: Secondary | ICD-10-CM | POA: Diagnosis not present

## 2020-01-23 DIAGNOSIS — H353221 Exudative age-related macular degeneration, left eye, with active choroidal neovascularization: Secondary | ICD-10-CM | POA: Diagnosis not present

## 2020-01-23 DIAGNOSIS — I10 Essential (primary) hypertension: Secondary | ICD-10-CM | POA: Diagnosis not present

## 2020-01-23 DIAGNOSIS — I714 Abdominal aortic aneurysm, without rupture, unspecified: Secondary | ICD-10-CM

## 2020-01-23 DIAGNOSIS — E113513 Type 2 diabetes mellitus with proliferative diabetic retinopathy with macular edema, bilateral: Secondary | ICD-10-CM | POA: Diagnosis not present

## 2020-01-23 DIAGNOSIS — Z961 Presence of intraocular lens: Secondary | ICD-10-CM | POA: Diagnosis not present

## 2020-01-23 NOTE — Patient Instructions (Signed)
Medication Instructions:  Your physician recommends that you continue on your current medications as directed. Please refer to the Current Medication list given to you today.  *If you need a refill on your cardiac medications before your next appointment, please call your pharmacy*   Lab Work: NONE   If you have labs (blood work) drawn today and your tests are completely normal, you will receive your results only by: . MyChart Message (if you have MyChart) OR . A paper copy in the mail If you have any lab test that is abnormal or we need to change your treatment, we will call you to review the results.   Testing/Procedures: NONE   Follow-Up: At [facility], you and your health needs are our priority.  As part of our continuing mission to provide you with exceptional heart care, we have created designated Provider Care Teams.  These Care Teams include your primary Cardiologist (physician) and Advanced Practice Providers (APPs -  Physician Assistants and Nurse Practitioners) who all work together to provide you with the care you need, when you need it.  We recommend signing up for the patient portal called "MyChart".  Sign up information is provided on this After Visit Summary.  MyChart is used to connect with patients for Virtual Visits (Telemedicine).  Patients are able to view lab/test results, encounter notes, upcoming appointments, etc.  Non-urgent messages can be sent to your provider as well.   To learn more about what you can do with MyChart, go to [email or web address].    Your next appointment:   6 month(s)  The format for your next appointment:   In Person  Provider:   Suresh Koneswaran, MD   Other Instructions Thank you for choosing [facility]!    

## 2020-01-24 ENCOUNTER — Telehealth: Payer: Self-pay | Admitting: Licensed Clinical Social Worker

## 2020-01-24 NOTE — Addendum Note (Signed)
Addended by: Levonne Hubert on: 01/24/2020 08:34 AM   Modules accepted: Orders

## 2020-01-24 NOTE — Telephone Encounter (Signed)
CSW referred to assist patient with obtaining a BP cuff. CSW contacted patient to inform cuff will be delivered to home. Patient grateful for support and assistance. CSW available as needed. Jackie Quintel Mccalla, LCSW, CCSW-MCS [phone]  

## 2020-01-31 DIAGNOSIS — H18519 Endothelial corneal dystrophy, unspecified eye: Secondary | ICD-10-CM | POA: Diagnosis not present

## 2020-01-31 DIAGNOSIS — Z961 Presence of intraocular lens: Secondary | ICD-10-CM | POA: Diagnosis not present

## 2020-02-08 ENCOUNTER — Other Ambulatory Visit: Payer: Self-pay | Admitting: Endocrinology

## 2020-02-14 ENCOUNTER — Ambulatory Visit (HOSPITAL_COMMUNITY)
Admission: RE | Admit: 2020-02-14 | Discharge: 2020-02-14 | Disposition: A | Discharge status: Home / Self Care | Payer: Medicare Other | Source: Ambulatory Visit | Attending: Urology | Admitting: Urology

## 2020-02-14 ENCOUNTER — Other Ambulatory Visit: Payer: Self-pay

## 2020-02-14 DIAGNOSIS — C61 Malignant neoplasm of prostate: Secondary | ICD-10-CM | POA: Insufficient documentation

## 2020-02-14 DIAGNOSIS — M545 Low back pain: Secondary | ICD-10-CM | POA: Diagnosis not present

## 2020-02-14 DIAGNOSIS — Z8546 Personal history of malignant neoplasm of prostate: Secondary | ICD-10-CM | POA: Diagnosis not present

## 2020-02-14 MED ORDER — TECHNETIUM TC 99M MEDRONATE IV KIT
20.6000 | PACK | Freq: Once | INTRAVENOUS | Status: AC
  Administered 2020-02-14: 20.6 via INTRAVENOUS

## 2020-02-19 DIAGNOSIS — C61 Malignant neoplasm of prostate: Secondary | ICD-10-CM | POA: Diagnosis not present

## 2020-02-22 DIAGNOSIS — N471 Phimosis: Secondary | ICD-10-CM | POA: Diagnosis not present

## 2020-02-22 DIAGNOSIS — C61 Malignant neoplasm of prostate: Secondary | ICD-10-CM | POA: Diagnosis not present

## 2020-02-22 DIAGNOSIS — R3912 Poor urinary stream: Secondary | ICD-10-CM | POA: Diagnosis not present

## 2020-03-08 DIAGNOSIS — H353221 Exudative age-related macular degeneration, left eye, with active choroidal neovascularization: Secondary | ICD-10-CM | POA: Diagnosis not present

## 2020-03-08 DIAGNOSIS — Z961 Presence of intraocular lens: Secondary | ICD-10-CM | POA: Diagnosis not present

## 2020-03-08 DIAGNOSIS — Z947 Corneal transplant status: Secondary | ICD-10-CM | POA: Diagnosis not present

## 2020-03-08 DIAGNOSIS — H18513 Endothelial corneal dystrophy, bilateral: Secondary | ICD-10-CM | POA: Diagnosis not present

## 2020-03-08 DIAGNOSIS — E113513 Type 2 diabetes mellitus with proliferative diabetic retinopathy with macular edema, bilateral: Secondary | ICD-10-CM | POA: Diagnosis not present

## 2020-03-11 ENCOUNTER — Telehealth (HOSPITAL_COMMUNITY): Payer: Self-pay

## 2020-03-11 NOTE — Telephone Encounter (Signed)

## 2020-03-12 ENCOUNTER — Encounter: Payer: Self-pay | Admitting: Vascular Surgery

## 2020-03-12 ENCOUNTER — Ambulatory Visit (INDEPENDENT_AMBULATORY_CARE_PROVIDER_SITE_OTHER): Payer: Medicare Other | Admitting: Vascular Surgery

## 2020-03-12 ENCOUNTER — Ambulatory Visit (HOSPITAL_COMMUNITY)
Admission: RE | Admit: 2020-03-12 | Discharge: 2020-03-12 | Disposition: A | Discharge status: Home / Self Care | Payer: Medicare Other | Source: Ambulatory Visit | Attending: Vascular Surgery | Admitting: Vascular Surgery

## 2020-03-12 ENCOUNTER — Other Ambulatory Visit: Payer: Self-pay

## 2020-03-12 VITALS — BP 145/72 | HR 74 | Temp 97.5°F | Resp 18 | Ht 72.0 in | Wt 197.0 lb

## 2020-03-12 DIAGNOSIS — I714 Abdominal aortic aneurysm, without rupture, unspecified: Secondary | ICD-10-CM

## 2020-03-12 NOTE — Progress Notes (Signed)
Patient name: Steve Bailey MRN: [id number] DOB: 15-Feb-1934 Sex: male  REASON FOR VISIT: 6 month follow-up for known AAA   HPI: Steve Bailey is a 86 y.o. male with history of diabetes, hypertension, hyperlipidemia, prostate cancer, A. Fib on eliquis, known abdominal aortic aneurysm, coronary disease status post CABG that presents for 6 month follow-up for surveillance of AAA.  Patient has had a known aneurysm since 2017 when this was discovered incidentally on a CT after a fall.  At the time it was about 4.4 cm.  This has been followed by surveillance.  He reports no new abdominal .  Has some chronic lower back pain that is intermittent.  No hx of abdominal surgery. Based on duplex imaging aneurysm was 4.6 cm on 06/28/2018 again 4.6 cm on 01/03/2019 and then 5 cm on 07/04/2019. CTA abdomen/pelvis shows stable at 5 cm 6 months ago.   Past Medical History:  Diagnosis Date  . AAA (abdominal aortic aneurysm) ([place]) followed by dr Bridgett Larsson   last duplex 06/28/2018 4.6 cm,  asymptomatic  . Anticoagulant long-term use    eliquis  . Benign localized prostatic hyperplasia with lower urinary tract symptoms (LUTS)   . CAD (coronary artery disease) cardiologist-  dr Bronson Ing   remote CABG in 1997; prior PCI to the LAD i n1990; Nuclear study in February of 2012 normal with an EF of 60%   . DDD (degenerative disc disease), cervical   . Full dentures   . History of radiation therapy 11/24/12-01/18/13   Prostate 78Gy/21fx  . HOH (hard of hearing)    both  . Hyperlipidemia   . Hyperlipidemia   . Hypertension   . Permanent atrial fibrillation ([place]) 09/2012   followed by dr Bronson Ing  . Phimosis    severe  . Proliferative diabetic retinopathy ([place])    both eyes  . Prostate cancer [facility]) urologist-- dr Tresa Moore   dx 09-02-2012-- Stage T2a, Gleason 3+4=7, PSA 8.12, vol 103 cc--- completed external beam radiation 02/ 2014;  recurrent 2015 , started hormone therapy  . S/P CABG x 3 1997  . Strains to urinate   . Type  2 diabetes mellitus treated with insulin [facility])    endocrinologist-- dr Dwyane Dee    Past Surgical History:  Procedure Laterality Date  . CARDIAC CATHETERIZATION  01-18-2004   dr Doreatha Lew   patent grafts, ef 55%, minimal anterior hypokinesis,  severe total occlusion of LCFx and LAD,  severe disease of D2 and segmental RCA  . CARDIOVASCULAR STRESS TEST  01/07/2011   normal nuclear study w/ no ischemia/  normal LV function and wall motion,  ef 60%  . CARPAL TUNNEL RELEASE Right   . CATARACT EXTRACTION W/ INTRAOCULAR LENS  IMPLANT, BILATERAL  left 2006;  right 2010  . CIRCUMCISION N/A 07/28/2018   Procedure: CIRCUMCISION ADULT;  Surgeon: Alexis Frock, MD;  Location: [facility];  Service: Urology;  Laterality: N/A;  . 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 CORONARY ANGIOPLASTY  1990   LAD  . CORONARY ARTERY BYPASS GRAFT  11-1995   shows distal LAD disease, without recurrent symptoms of angina with questionable mild apical ischemia but with normal EF   . ESOPHAGOGASTRODUODENOSCOPY (EGD) WITH ESOPHAGEAL DILATION  05/2012  . KNEE ARTHROSCOPY Left   . TRANSTHORACIC ECHOCARDIOGRAM  10/10/2012   ef 55%, akinesis of the inferolateral wall,  grade 2 diastolic dysfunction/  trivial AR/  mild MR and TR/  mild LAE/  mild RVSF reduced and mild dilated RV  Family History  Problem Relation Age of Onset  . Stroke Father   . Heart attack Mother   . Heart disease Mother   . Cancer Sister        lung  . Cancer Brother        prostate  . Diabetes Brother   . Stroke Brother   . Stroke Brother   . Heart disease Brother   . Heart disease Brother   . Heart attack Sister   . Heart disease Sister   . Heart attack Sister   . Cancer Sister        breast  . Heart disease Sister   . Heart attack Sister   . Heart disease Sister   . Heart disease Sister     SOCIAL HISTORY: Social History   Tobacco Use  . Smoking status: Former Smoker    Years: 20.00    Types: Cigarettes    Quit date:  10/14/1979    Years since quitting: 40.4  . Smokeless tobacco: Former Systems developer    Types: Chew    Quit date: 11/23/2012  Substance Use Topics  . Alcohol use: No    Allergies  Allergen Reactions  . Cephalexin Nausea And Vomiting    Current Outpatient Medications  Medication Sig Dispense Refill  . acetaminophen (TYLENOL) 500 MG tablet Take 1,300 mg by mouth at bedtime.     Marland Kitchen alfuzosin (UROXATRAL) 10 MG 24 hr tablet Take 10 mg by mouth at bedtime. Reported on 12/18/2015    . amLODipine (NORVASC) 10 MG tablet TAKE 1 TABLET (10 MG TOTAL) BY MOUTH DAILY. 90 tablet 3  . apixaban (ELIQUIS) 5 MG TABS tablet Take 1 tablet (5 mg total) by mouth 2 (two) times daily. 180 tablet 2  . atorvastatin (LIPITOR) 40 MG tablet Take 1 tablet (40 mg total) by mouth daily. 90 tablet 3  . Cinnamon 500 MG capsule Take 500 mg by mouth 2 (two) times daily.    . finasteride (PROSCAR) 5 MG tablet Take 5 mg by mouth every morning. Reported on 12/18/2015    . glucose blood (FREESTYLE LITE) test strip USE AS DIRECTED TWICE A DAY DX CODE:11.65 100 each 1  . Insulin Isophane & Regular Human (HUMULIN 70/30 KWIKPEN) (70-30) 100 UNIT/ML PEN Inject 20 Units into the skin in the morning and at bedtime.    . Insulin Pen Needle (PEN NEEDLES) 31G X 5 MM MISC 1 each by Does not apply route 2 (two) times daily. 90 each 2  . lisinopril (ZESTRIL) 20 MG tablet TAKE 1 TABLET BY MOUTH EVERY DAY 90 tablet 1  . metFORMIN (GLUCOPHAGE-XR) 750 MG 24 hr tablet TAKE 2 TABLETS BY MOUTH DAILY WITH BREAKFAST. 180 tablet 1  . metoprolol succinate (TOPROL-XL) 25 MG 24 hr tablet TAKE 1 TABLET BY MOUTH EVERY DAY 90 tablet 1  . Omega-3 Fatty Acids (OMEGA-3 FISH OIL PO) Take 1 capsule by mouth daily.    . prednisoLONE acetate (PRED FORTE) 1 % ophthalmic suspension PLACE 1 DROP INTO THE RIGHT EYE DAILY.    . traMADol (ULTRAM) 50 MG tablet Take 1 tablet (50 mg total) by mouth every 6 (six) hours as needed for moderate pain. Post-operatively 15 tablet 0   No  current facility-administered medications for this visit.    REVIEW OF SYSTEMS:  [X]  denotes positive finding, [ ]  denotes negative finding Cardiac  Comments:  Chest pain or chest pressure:    Shortness of breath upon exertion:    Short of breath  when lying flat:    Irregular heart rhythm:        Vascular    Pain in calf, thigh, or hip brought on by ambulation:    Pain in feet at night that wakes you up from your sleep:     Blood clot in your veins:    Leg swelling:         Pulmonary    Oxygen at home:    Productive cough:     Wheezing:         Neurologic    Sudden weakness in arms or legs:     Sudden numbness in arms or legs:     Sudden onset of difficulty speaking or slurred speech:    Temporary loss of vision in one eye:     Problems with dizziness:         Gastrointestinal    Blood in stool:     Vomited blood:         Genitourinary    Burning when urinating:     Blood in urine:        Psychiatric    Major depression:         Hematologic    Bleeding problems:    Problems with blood clotting too easily:        Skin    Rashes or ulcers:        Constitutional    Fever or chills:      PHYSICAL EXAM: Vitals:   03/12/20 0950  BP: (!) 145/72  Pulse: 74  Resp: 18  Temp: (!) 97.5 F (36.4 C)  TempSrc: Temporal  SpO2: 100%  Weight: 197 lb (89.4 kg)  Height: 6' (1.829 m)    GENERAL: The patient is a well-nourished male, in no acute distress. The vital signs are documented above. CARDIAC: There is a regular rate and rhythm.  VASCULAR:  2+ palpable femoral pulse bilaterally PULMONARY: There is good air exchange bilaterally without wheezing or rales. ABDOMEN: Soft and non-tender with normal pitched bowel sounds.  No pain with palpation of aneurysm. MUSCULOSKELETAL: There are no major deformities or cyanosis. NEUROLOGIC: No focal weakness or paresthesias are detected. SKIN: There are no ulcers or rashes noted. PSYCHIATRIC: The patient has a normal  affect.  DATA:   Reviewed his AAA duplex from today and aneurysm has grown from approximately 5 cm to 5.2 cm over the last 6 months  Assessment/Plan:  86 year old male has been followed for known abdominal aortic aneurysm presents for ongoing 43-month follow-up.  This was initially discovered in 2017.  The AAA last measured 5 cm on CT 6 months ago.  On 16-month follow-up today now measures 5.2 cm with minimal growth over the last 6 months.  Discussed that current guidelines are to repair these at greater than 5.5 cm unless there is rapid growth or he becomes symptomatic.  I recommend a follow-up again in 6 months with another AAA duplex.  He will call with any concerns or questions sooner.  Marty Heck, MD [facility] [employer] [facility] Office: [phone]

## 2020-03-13 ENCOUNTER — Other Ambulatory Visit: Payer: Self-pay | Admitting: *Deleted

## 2020-03-13 DIAGNOSIS — I714 Abdominal aortic aneurysm, without rupture, unspecified: Secondary | ICD-10-CM

## 2020-04-12 IMAGING — NM NM BONE WHOLE BODY
2 series · 2 of 2 positions shown · non-contrast
Comparison: None.

CLINICAL DATA: History of prostate cancer. Evaluate for metastatic
disease. Low back pain.

EXAM:
NUCLEAR MEDICINE WHOLE BODY BONE SCAN
TECHNIQUE: Whole body anterior and posterior images were obtained approximately
3 hours after intravenous injection of radiopharmaceutical.
RADIOPHARMACEUTICALS:  20.6 mCi Uechnetium-88m MDP IV

[Series 1: whole body · 2.66mm/px · 1 of 1 slices shown (1 of 2)]
[im 1/1]
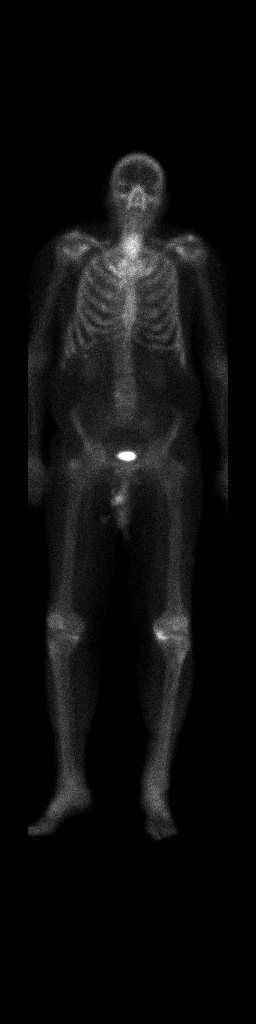

[Series 1: whole body · 2.66mm/px · 1 of 1 slices shown (2 of 2)]
[im 1/1]
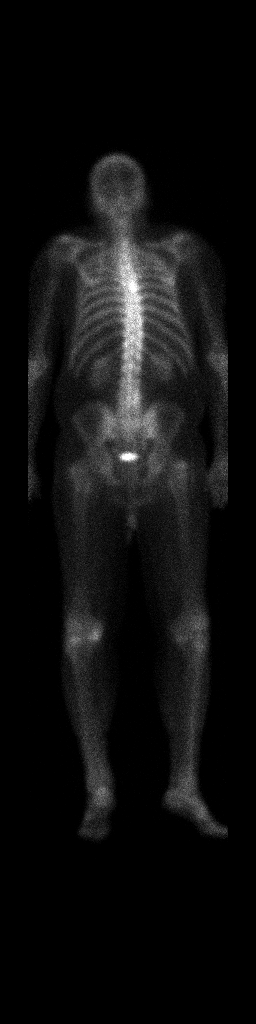

[2 of 2 positions shown; findings below may reference images not displayed]

FINDINGS: Degenerative changes in the knees, left greater than right. Mild
uptake in the right greater trochanter with no CT correlate on the
CT scan from September 06, 2019 this finding is asymmetric to the
left. Degenerative changes in the thoracic and lumbar spine. No
other evidence of bony metastatic disease identified. Soft tissues
are normal.
IMPRESSION: 1. Mild uptake at the right femoral greater trochanter with no CT
correlate from September 06, 2019. The finding is relatively subtle.
While suspicion for metastatic disease is low, recommend dedicated
imaging of the right hip for further evaluation.
2. No other scintigraphic evidence of bony metastatic disease.

## 2020-04-17 ENCOUNTER — Other Ambulatory Visit: Payer: Self-pay | Admitting: Cardiovascular Disease

## 2020-04-19 ENCOUNTER — Other Ambulatory Visit: Payer: Self-pay

## 2020-04-19 MED ORDER — HUMULIN 70/30 KWIKPEN (70-30) 100 UNIT/ML ~~LOC~~ SUPN: 20.0000 [IU] | PEN_INJECTOR | Freq: Two times a day (BID) | SUBCUTANEOUS | 1 refills | Status: DC

## 2020-04-24 ENCOUNTER — Other Ambulatory Visit: Payer: Self-pay

## 2020-05-10 ENCOUNTER — Other Ambulatory Visit (INDEPENDENT_AMBULATORY_CARE_PROVIDER_SITE_OTHER): Payer: Medicare Other

## 2020-05-10 ENCOUNTER — Other Ambulatory Visit: Payer: Self-pay

## 2020-05-10 ENCOUNTER — Other Ambulatory Visit: Payer: Self-pay | Admitting: Endocrinology

## 2020-05-10 DIAGNOSIS — E1165 Type 2 diabetes mellitus with hyperglycemia: Secondary | ICD-10-CM

## 2020-05-10 DIAGNOSIS — Z794 Long term (current) use of insulin: Secondary | ICD-10-CM

## 2020-05-10 LAB — BASIC METABOLIC PANEL
BUN: 23 mg/dL (ref 6–23)
CO2: 27 mEq/L (ref 19–32)
Calcium: 9.1 mg/dL (ref 8.4–10.5)
Chloride: 105 mEq/L (ref 96–112)
Creatinine, Ser: 1.19 mg/dL (ref 0.40–1.50)
GFR: 58.24 mL/min — ABNORMAL LOW (ref >60.00)
Glucose, Bld: 167 mg/dL — ABNORMAL HIGH (ref 70–99)
Potassium: 3.9 mEq/L (ref 3.5–5.1)
Sodium: 138 mEq/L (ref 135–145)

## 2020-05-10 LAB — HEMOGLOBIN A1C: Hgb A1c MFr Bld: 6.5 % (ref 4.6–6.5)

## 2020-05-13 ENCOUNTER — Other Ambulatory Visit: Payer: Self-pay | Admitting: Endocrinology

## 2020-05-14 ENCOUNTER — Encounter: Payer: Self-pay | Admitting: Endocrinology

## 2020-05-14 ENCOUNTER — Ambulatory Visit (INDEPENDENT_AMBULATORY_CARE_PROVIDER_SITE_OTHER): Payer: Medicare Other | Admitting: Endocrinology

## 2020-05-14 ENCOUNTER — Other Ambulatory Visit: Payer: Self-pay

## 2020-05-14 VITALS — BP 138/86 | HR 67 | Ht 72.0 in | Wt 195.0 lb

## 2020-05-14 DIAGNOSIS — E1151 Type 2 diabetes mellitus with diabetic peripheral angiopathy without gangrene: Secondary | ICD-10-CM | POA: Diagnosis not present

## 2020-05-14 DIAGNOSIS — I10 Essential (primary) hypertension: Secondary | ICD-10-CM | POA: Diagnosis not present

## 2020-05-14 DIAGNOSIS — E119 Type 2 diabetes mellitus without complications: Secondary | ICD-10-CM | POA: Diagnosis not present

## 2020-05-14 DIAGNOSIS — I25708 Atherosclerosis of coronary artery bypass graft(s), unspecified, with other forms of angina pectoris: Secondary | ICD-10-CM

## 2020-05-14 DIAGNOSIS — I700 Atherosclerosis of aorta: Secondary | ICD-10-CM

## 2020-05-14 NOTE — Progress Notes (Signed)
Patient ID: Steve Bailey, male   DOB: [birth year], 86 y.o.   MRN: [id number]    Reason for Appointment:  follow-up of various problems  History of Present Illness    Diagnosis: Type 2 DIABETES MELITUS, date of diagnosis: 1995              History:     Insulin regimen: Humulin 70/30 KwikPen,  20 units  a.c. 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  Weight is down slightly  No symptoms of hypoglycemia at any time and is aware of what symptoms to look for  No side effects with Metformin  Although A1c is slightly higher it is still regularly stable  Glucometer: Freestyle  Blood sugar readings by recall at different times of the day in the range of  Blood sugar readings by recall: 125-160    Carbohydrate intake: at meals: Mostly with vegetables, also getting some lean meats like Chicken and pork Egg, bacon, grits in am    Weight history:  Wt Readings from Last 3 Encounters:  05/14/20 195 lb (88.5 kg)  03/12/20 197 lb (89.4 kg)  01/23/20 198 lb (89.8 kg)      Lab Results  Component Value Date   HGBA1C 6.5 05/10/2020   HGBA1C 6.0 (A) 01/15/2020   HGBA1C 6.1 (A) 08/24/2019   Lab Results  Component Value Date   MICROALBUR 14.7 (H) 01/15/2020   LDLCALC 81 01/15/2020   CREATININE 1.19 05/10/2020    Other active problems: See review of systems    Allergies as of 05/14/2020      Reactions   Cephalexin Nausea And Vomiting       Medication List       Accurate as of May 14, 2020  9:24 PM. If you have any questions, ask your nurse or doctor.        acetaminophen 500 MG tablet Commonly known as: TYLENOL Take 1,300 mg by mouth at bedtime.   alfuzosin 10 MG 24 hr tablet Commonly known as: UROXATRAL Take 10 mg by mouth at bedtime. Reported on 12/18/2015   amLODipine 10 MG tablet Commonly known as: NORVASC TAKE 1 TABLET BY MOUTH EVERY DAY   apixaban 5 MG Tabs tablet Commonly known as: Eliquis Take 1 tablet (5 mg total) by mouth 2 (two) times daily.   atorvastatin 40 MG tablet Commonly known as: LIPITOR Take 1 tablet (40 mg total) by mouth daily.   Cinnamon 500 MG capsule Take 500 mg by mouth 2 (two) times daily.   finasteride 5 MG tablet Commonly known as: PROSCAR Take 5 mg by mouth every morning. Reported on 12/18/2015   glucose blood test strip Commonly known as: FREESTYLE LITE USE AS DIRECTED TWICE A DAY DX CODE:11.65   HumuLIN 70/30 KwikPen (70-30) 100 UNIT/ML KwikPen Generic drug: insulin isophane & regular human Inject 20 Units into the skin in the morning and at  bedtime.   lisinopril 20 MG tablet Commonly known as: ZESTRIL TAKE 1 TABLET BY MOUTH EVERY DAY   metFORMIN 750 MG 24 hr tablet Commonly known as: GLUCOPHAGE-XR TAKE 2 TABLETS BY MOUTH DAILY WITH BREAKFAST.   metoprolol succinate 25 MG 24 hr tablet Commonly known as: TOPROL-XL TAKE 1 TABLET BY MOUTH EVERY DAY   OMEGA-3 FISH OIL PO Take 1 capsule by mouth daily.   Pen Needles 31G X 5 MM Misc 1 each by Does not apply route 2 (two) times daily.   prednisoLONE acetate 1 % ophthalmic suspension Commonly known as: PRED FORTE PLACE 1 DROP INTO THE RIGHT EYE DAILY.   traMADol 50 MG tablet Commonly known as: Ultram Take 1 tablet (50 mg total) by mouth every 6 (six) hours as needed for moderate pain. Post-operatively       Allergies:  Allergies  Allergen Reactions  . Cephalexin Nausea And Vomiting    Past Medical  History:  Diagnosis Date  . AAA (abdominal aortic aneurysm) ([place]) followed by dr Bridgett Larsson   last duplex 06/28/2018 4.6 cm,  asymptomatic  . Anticoagulant long-term use    eliquis  . Benign localized prostatic hyperplasia with lower urinary tract symptoms (LUTS)   . CAD (coronary artery disease) cardiologist-  dr Bronson Ing   remote CABG in 1997; prior PCI to the LAD i n1990; Nuclear study in February of 2012 normal with an EF of 60%   . DDD (degenerative disc disease), cervical   . Full dentures   . History of radiation therapy 11/24/12-01/18/13   Prostate 78Gy/16fx  . HOH (hard of hearing)    both  . Hyperlipidemia   . Hyperlipidemia   . Hypertension   . Permanent atrial fibrillation ([place]) 09/2012   followed by dr Bronson Ing  . Phimosis    severe  . Proliferative diabetic retinopathy ([place])    both eyes  . Prostate cancer [facility]) urologist-- dr Tresa Moore   dx 09-02-2012-- Stage T2a, Gleason 3+4=7, PSA 8.12, vol 103 cc--- completed external beam radiation 02/ 2014;  recurrent 2015 , started hormone therapy  . S/P CABG x 3 1997  . Strains to urinate   . Type 2 diabetes mellitus treated with insulin [facility])    endocrinologist-- dr Dwyane Dee    Past Surgical History:  Procedure Laterality Date  . CARDIAC CATHETERIZATION  01-18-2004   dr Doreatha Lew   patent grafts, ef 55%, minimal anterior hypokinesis,  severe total occlusion of LCFx and LAD,  severe disease of D2 and segmental RCA  . CARDIOVASCULAR STRESS TEST  01/07/2011   normal nuclear study w/ no ischemia/  normal LV function and wall motion,  ef 60%  . CARPAL TUNNEL RELEASE Right   . CATARACT EXTRACTION W/ INTRAOCULAR LENS  IMPLANT, BILATERAL  left 2006;  right 2010  . CIRCUMCISION N/A 07/28/2018   Procedure: CIRCUMCISION ADULT;  Surgeon: Alexis Frock, MD;  Location: [facility];  Service: Urology;  Laterality: N/A;  . 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 CORONARY ANGIOPLASTY  1990   LAD  . CORONARY ARTERY BYPASS GRAFT  11-1995    shows distal LAD disease, without recurrent symptoms of angina with questionable mild apical ischemia but with normal EF   . ESOPHAGOGASTRODUODENOSCOPY (EGD) WITH ESOPHAGEAL DILATION  05/2012  . KNEE ARTHROSCOPY Left   . TRANSTHORACIC ECHOCARDIOGRAM  10/10/2012   ef 55%, akinesis of the inferolateral wall,  grade 2 diastolic dysfunction/  trivial AR/  mild MR and TR/  mild LAE/  mild RVSF reduced  and mild dilated RV    Family History  Problem Relation Age of Onset  . Stroke Father   . Heart attack Mother   . Heart disease Mother   . Cancer Sister        lung  . Cancer Brother        prostate  . Diabetes Brother   . Stroke Brother   . Stroke Brother   . Heart disease Brother   . Heart disease Brother   . Heart attack Sister   . Heart disease Sister   . Heart attack Sister   . Cancer Sister        breast  . Heart disease Sister   . Heart attack Sister   . Heart disease Sister   . Heart disease Sister     Social History:  reports that he quit smoking about 40 years ago. His smoking use included cigarettes. He quit after 20.00 years of use. He quit smokeless tobacco use about 7 years ago.  His smokeless tobacco use included chew. He reports that he does not drink alcohol and does not use drugs.  ROS   HYPERTENSION:   Has been present for several years.    He has been treated with lisinopril 20 mg, amlodipine 10mg , and metoprolol 25 mg His amlodipine has been prescribed by cardiologist  He apparently has a new blood pressure meter at home but does not remember the exact readings, usually he thinks systolic is about 540+ and not clear what the diastolics are at home Blood pressure was higher on second attempt than the first measurement today  BP Readings from Last 3 Encounters:  05/14/20 138/86  03/12/20 (!) 145/72  01/23/20 (!) 158/82          HYPERLIPIDEMIA:    he has had high LDL at baseline and this has been well-controlled with 40 mg Lipitor prescribed by his  cardiologist Labs as follows:   Lab Results  Component Value Date   CHOL 150 01/15/2020   HDL 50.00 01/15/2020   LDLCALC 81 01/15/2020   TRIG 93.0 01/15/2020   CHOLHDL 3 01/15/2020    Diabetic foot exam done in 6/21 showed normal monofilament sensation and absent pulses on the left  He is being followed by vascular surgeon for aortic aneurysm  He is being followed regularly by [place] [facility], has corneal dystrophy and macular edema  He refuses to take the Covid vaccine      Examination:   BP 138/86   Pulse 67   Ht 6' (1.829 m)   Wt 195 lb (88.5 kg)   SpO2 99%   BMI 26.45 kg/m   Body mass index is 26.45 kg/m.  Diabetic Foot Exam - Simple   Simple Foot Form Diabetic Foot exam was performed with the following findings: Yes   Visual Inspection No deformities, no ulcerations, no other skin breakdown bilaterally: Yes Sensation Testing Intact to touch and monofilament testing bilaterally: Yes Pulse Check See comments: Yes Comments 2+ right posterior tibialis otherwise absent pulses Skin color and temperature is normal      Assesment/PLAN:  DIABETES, Type II on insulin:  See history of present illness for  discussion of current diabetes management and problems identified  His A1c is 6.5   He has been on the same dose of premixed insulin twice a day along with metformin long-term He reports fairly good blood sugars at home without hypoglycemia Recently weight is slightly better with his being more active Office glucose  was 140 after a light lunch and 167 after breakfast on the labs  His insulin and metformin will be continued unchanged Reminded him to bring his monitor for download and try to check blood sugars at different times by rotation   HYPERTENSION: Blood pressure is relatively higher today Reportedly blood pressure is not high at home but he did not bring his monitor for comparison or keep a record of his blood pressure reading Also to be  followed by cardiologist   LIPIDS: He needs to have follow-up with his cardiologist, last lipid panel was in 6/20  Diabetic retinopathy: Followed closely, has macular edema and corneal dystrophy followed by his ophthalmologists regularly  Follow-up in 4 months   There are no Patient Instructions on file for this visit.   Elayne Snare 05/14/2020, 9:24 PM

## 2020-05-15 ENCOUNTER — Other Ambulatory Visit: Payer: Self-pay | Admitting: Endocrinology

## 2020-05-15 ENCOUNTER — Other Ambulatory Visit: Payer: Self-pay | Admitting: Cardiovascular Disease

## 2020-05-15 NOTE — Telephone Encounter (Signed)
Office note from yesterday indicates HTN to be followed by Cardiologist. Would you like to refill or defer to pt's cardiologist?

## 2020-05-15 NOTE — Telephone Encounter (Signed)
Noted  

## 2020-05-15 NOTE — Telephone Encounter (Signed)
This refill request is being forward to pt's cardiologist at the direction of Dr. Dwyane Dee.

## 2020-05-15 NOTE — Telephone Encounter (Signed)
I will need the cardiologist to take over his blood pressure medications

## 2020-05-16 MED ORDER — LISINOPRIL 20 MG PO TABS: 20.0000 mg | ORAL_TABLET | Freq: Every day | ORAL | 3 refills | Status: DC

## 2020-05-16 NOTE — Telephone Encounter (Signed)
Can we please refill Steve Schum' lisinopril 20 mg daily?

## 2020-05-16 NOTE — Telephone Encounter (Signed)
refilled 

## 2020-05-16 NOTE — Addendum Note (Signed)
Addended by: Barbarann Ehlers A on: 05/16/2020 11:15 AM   Modules accepted: Orders

## 2020-07-16 DIAGNOSIS — H353221 Exudative age-related macular degeneration, left eye, with active choroidal neovascularization: Secondary | ICD-10-CM | POA: Diagnosis not present

## 2020-07-16 DIAGNOSIS — Z961 Presence of intraocular lens: Secondary | ICD-10-CM | POA: Diagnosis not present

## 2020-07-16 DIAGNOSIS — H18512 Endothelial corneal dystrophy, left eye: Secondary | ICD-10-CM | POA: Diagnosis not present

## 2020-07-16 DIAGNOSIS — Z947 Corneal transplant status: Secondary | ICD-10-CM | POA: Diagnosis not present

## 2020-07-16 DIAGNOSIS — E113513 Type 2 diabetes mellitus with proliferative diabetic retinopathy with macular edema, bilateral: Secondary | ICD-10-CM | POA: Diagnosis not present

## 2020-07-31 ENCOUNTER — Ambulatory Visit: Payer: Medicare Other | Admitting: Cardiovascular Disease

## 2020-08-07 ENCOUNTER — Encounter: Payer: Self-pay | Admitting: Student

## 2020-08-07 ENCOUNTER — Ambulatory Visit (INDEPENDENT_AMBULATORY_CARE_PROVIDER_SITE_OTHER): Payer: Medicare Other | Admitting: Student

## 2020-08-07 ENCOUNTER — Other Ambulatory Visit: Payer: Self-pay

## 2020-08-07 VITALS — BP 142/68 | HR 76 | Ht 72.0 in | Wt 198.8 lb

## 2020-08-07 DIAGNOSIS — I10 Essential (primary) hypertension: Secondary | ICD-10-CM

## 2020-08-07 DIAGNOSIS — E785 Hyperlipidemia, unspecified: Secondary | ICD-10-CM

## 2020-08-07 DIAGNOSIS — I714 Abdominal aortic aneurysm, without rupture, unspecified: Secondary | ICD-10-CM

## 2020-08-07 DIAGNOSIS — Z79899 Other long term (current) drug therapy: Secondary | ICD-10-CM | POA: Diagnosis not present

## 2020-08-07 DIAGNOSIS — I4821 Permanent atrial fibrillation: Secondary | ICD-10-CM

## 2020-08-07 DIAGNOSIS — I25708 Atherosclerosis of coronary artery bypass graft(s), unspecified, with other forms of angina pectoris: Secondary | ICD-10-CM | POA: Diagnosis not present

## 2020-08-07 NOTE — Progress Notes (Signed)
Cardiology Office Note    Date:  08/07/2020   ID:  Steve Bailey, DOB 31-May-1934, MRN [id number]  PCP:  Pieter Partridge, PA  Cardiologist: Kate Sable, MD (Inactive) --> Will switch to Dr. Domenic Polite  Chief Complaint  Patient presents with  . Follow-up    6 month visit    History of Present Illness:    Steve Bailey is a 86 y.o. male with past medical history of CAD (s/p CABG in 1997, patent grafts by cath in 2005, low-risk NST in 2012), permanent atrial fibrillation, HTN, HLD, IDDM and AAA (at 5.0 cm in 06/2019 and 08/2019, at 5.2 cm by imaging in 02/2020) who presents to the office today for 72-month follow-up.   He was last examined by myself in 01/2020 and had been less active due to the weather but was previously doing routine yard work and taking care of a large garden. He denied any recent chest pain or dyspnea on exertion. He was continued on Toprol-XL and Atorvastatin. Was not on ASA given the need for Eliquis in the setting of atrial fibrillation.  In talking with the patient and his daughter today, he reports overall doing well since his last visit. He has a garden which is over an acre in size and stays busy tending to it. He walks up and down a hill multiple times each day and denies any specific chest pain or palpitations with this. He does have mild dyspnea at times but says this has been stable for years and denies any acute change in his symptoms. No recent orthopnea, PND or lower extremity edema.  He reports good compliance with his current medication regimen. He does experience easy bruising with Eliquis but denies any recent melena, hematochezia or hematuria.  Past Medical History:  Diagnosis Date  . AAA (abdominal aortic aneurysm) ([place]) followed by dr Bridgett Larsson   last duplex 06/28/2018 4.6 cm,  asymptomatic  . Anticoagulant long-term use    eliquis  . Benign localized prostatic hyperplasia with lower urinary tract symptoms (LUTS)   . CAD (coronary artery disease)  cardiologist-  dr Bronson Ing   remote CABG in 1997; prior PCI to the LAD i n1990; Nuclear study in February of 2012 normal with an EF of 60%   . DDD (degenerative disc disease), cervical   . Full dentures   . History of radiation therapy 11/24/12-01/18/13   Prostate 78Gy/19fx  . HOH (hard of hearing)    both  . Hyperlipidemia   . Hyperlipidemia   . Hypertension   . Permanent atrial fibrillation ([place]) 09/2012   followed by dr Bronson Ing  . Phimosis    severe  . Proliferative diabetic retinopathy ([place])    both eyes  . Prostate cancer [facility]) urologist-- dr Tresa Moore   dx 09-02-2012-- Stage T2a, Gleason 3+4=7, PSA 8.12, vol 103 cc--- completed external beam radiation 02/ 2014;  recurrent 2015 , started hormone therapy  . S/P CABG x 3 1997  . Strains to urinate   . Type 2 diabetes mellitus treated with insulin [facility])    endocrinologist-- dr Dwyane Dee    Past Surgical History:  Procedure Laterality Date  . CARDIAC CATHETERIZATION  01-18-2004   dr Doreatha Lew   patent grafts, ef 55%, minimal anterior hypokinesis,  severe total occlusion of LCFx and LAD,  severe disease of D2 and segmental RCA  . CARDIOVASCULAR STRESS TEST  01/07/2011   normal nuclear study w/ no ischemia/  normal LV function and wall motion,  ef 60%  . CARPAL  TUNNEL RELEASE Right   . CATARACT EXTRACTION W/ INTRAOCULAR LENS  IMPLANT, BILATERAL  left 2006;  right 2010  . CIRCUMCISION N/A 07/28/2018   Procedure: CIRCUMCISION ADULT;  Surgeon: Alexis Frock, MD;  Location[facility];  Service: Urology;  Laterality: N/A;  . 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 CORONARY ANGIOPLASTY  1990   LAD  . CORONARY ARTERY BYPASS GRAFT  11-1995   shows distal LAD disease, without recurrent symptoms of angina with questionable mild apical ischemia but with normal EF   . ESOPHAGOGASTRODUODENOSCOPY (EGD) WITH ESOPHAGEAL DILATION  05/2012  . KNEE ARTHROSCOPY Left   . TRANSTHORACIC ECHOCARDIOGRAM  10/10/2012   ef 55%, akinesis of the  inferolateral wall,  grade 2 diastolic dysfunction/  trivial AR/  mild MR and TR/  mild LAE/  mild RVSF reduced and mild dilated RV    Current Medications: Outpatient Medications Prior to Visit  Medication Sig Dispense Refill  . acetaminophen (TYLENOL) 500 MG tablet Take 1,300 mg by mouth at bedtime.     Marland Kitchen alfuzosin (UROXATRAL) 10 MG 24 hr tablet Take 10 mg by mouth at bedtime. Reported on 12/18/2015    . amLODipine (NORVASC) 10 MG tablet TAKE 1 TABLET BY MOUTH EVERY DAY 90 tablet 3  . atorvastatin (LIPITOR) 40 MG tablet Take 1 tablet (40 mg total) by mouth daily. 90 tablet 3  . Cinnamon 500 MG capsule Take 500 mg by mouth 2 (two) times daily.    Marland Kitchen ELIQUIS 5 MG TABS tablet TAKE 1 TABLET BY MOUTH TWICE A DAY 180 tablet 2  . finasteride (PROSCAR) 5 MG tablet Take 5 mg by mouth every morning. Reported on 12/18/2015    . glucose blood (FREESTYLE LITE) test strip USE AS DIRECTED TWICE A DAY DX CODE:11.65 100 each 1  . insulin isophane & regular human (HUMULIN 70/30 KWIKPEN) (70-30) 100 UNIT/ML KwikPen Inject 20 Units into the skin in the morning and at bedtime. 45 mL 1  . Insulin Pen Needle (PEN NEEDLES) 31G X 5 MM MISC 1 each by Does not apply route 2 (two) times daily. 90 each 2  . lisinopril (ZESTRIL) 20 MG tablet Take 1 tablet (20 mg total) by mouth daily. 90 tablet 3  . metFORMIN (GLUCOPHAGE-XR) 750 MG 24 hr tablet TAKE 2 TABLETS BY MOUTH DAILY WITH BREAKFAST. 180 tablet 0  . metoprolol succinate (TOPROL-XL) 25 MG 24 hr tablet TAKE 1 TABLET BY MOUTH EVERY DAY 90 tablet 1  . Omega-3 Fatty Acids (OMEGA-3 FISH OIL PO) Take 1 capsule by mouth daily.    . prednisoLONE acetate (PRED FORTE) 1 % ophthalmic suspension PLACE 1 DROP INTO THE RIGHT EYE DAILY.    . traMADol (ULTRAM) 50 MG tablet Take 1 tablet (50 mg total) by mouth every 6 (six) hours as needed for moderate pain. Post-operatively 15 tablet 0   No facility-administered medications prior to visit.     Allergies:   Cephalexin   Social  History   Socioeconomic History  . Marital status: Widowed    Spouse name: Not on file  . Number of children: Not on file  . Years of education: Not on file  . Highest education level: Not on file  Occupational History  . Not on file  Tobacco Use  . Smoking status: Former Smoker    Years: 20.00    Types: Cigarettes    Quit date: 10/14/1979    Years since quitting: 40.8  . Smokeless tobacco: Former Systems developer    Types: Loss adjuster, chartered  Quit date: 11/23/2012  Vaping Use  . Vaping Use: Never used  Substance and Sexual Activity  . Alcohol use: No  . Drug use: No  . Sexual activity: Not on file  Other Topics Concern  . Not on file  Social History Narrative  . Not on file   Social Determinants of Health   Financial Resource Strain:   . Difficulty of Paying Living Expenses: Not on file  Food Insecurity:   . Worried About Charity fundraiser in the Last Year: Not on file  . Ran Out of Food in the Last Year: Not on file  Transportation Needs:   . Lack of Transportation (Medical): Not on file  . Lack of Transportation (Non-Medical): Not on file  Physical Activity:   . Days of Exercise per Week: Not on file  . Minutes of Exercise per Session: Not on file  Stress:   . Feeling of Stress : Not on file  Social Connections:   . Frequency of Communication with Friends and Family: Not on file  . Frequency of Social Gatherings with Friends and Family: Not on file  . Attends Religious Services: Not on file  . Active Member of Clubs or Organizations: Not on file  . Attends Archivist Meetings: Not on file  . Marital Status: Not on file     Family History:  The patient's family history includes Cancer in his brother, sister, and sister; Diabetes in his brother; Heart attack in his mother, sister, sister, and sister; Heart disease in his brother, brother, mother, sister, sister, sister, and sister; Stroke in his brother, brother, and father.   Review of Systems:   Please see the history of  present illness.     General:  No chills, fever, night sweats or weight changes.  Cardiovascular:  No chest pain, edema, orthopnea, palpitations, paroxysmal nocturnal dyspnea. Positive for dyspnea on exertion (stable).  Dermatological: No rash, lesions/masses Respiratory: No cough, dyspnea Urologic: No hematuria, dysuria Abdominal:   No nausea, vomiting, diarrhea, bright red blood per rectum, melena, or hematemesis Neurologic:  No visual changes, wkns, changes in mental status. All other systems reviewed and are otherwise negative except as noted above.   Physical Exam:    VS:  BP (!) 142/68   Pulse 76   Ht 6' (1.829 m)   Wt 198 lb 12.8 oz (90.2 kg)   SpO2 96%   BMI 26.96 kg/m    General: Well developed, elderly male appearing in no acute distress. Head: Normocephalic, atraumatic. Neck: No carotid bruits. JVD not elevated.  Lungs: Respirations regular and unlabored, without wheezes or rales.  Heart: Irregularly irregular. No S3 or S4.  No murmur, no rubs, or gallops appreciated. Abdomen: Appears non-distended. No obvious abdominal masses. Msk:  Strength and tone appear normal for age. No obvious joint deformities or effusions. Extremities: No clubbing or cyanosis. No lower extremity edema.  Distal pedal pulses are 2+ bilaterally. Neuro: Alert and oriented X 3. Moves all extremities spontaneously. No focal deficits noted. Psych:  Responds to questions appropriately with a normal affect. Skin: No rashes or lesions noted  Wt Readings from Last 3 Encounters:  08/07/20 198 lb 12.8 oz (90.2 kg)  05/14/20 195 lb (88.5 kg)  03/12/20 197 lb (89.4 kg)     Studies/Labs Reviewed:   EKG:  EKG is not ordered today.   Recent Labs: 01/15/2020: ALT 13 05/10/2020: BUN 23; Creatinine, Ser 1.19; Potassium 3.9; Sodium 138   Lipid Panel  Component Value Date/Time   CHOL 150 01/15/2020 1618   TRIG 93.0 01/15/2020 1618   HDL 50.00 01/15/2020 1618   CHOLHDL 3 01/15/2020 1618   VLDL 18.6  01/15/2020 1618   LDLCALC 81 01/15/2020 1618    Additional studies/ records that were reviewed today include:   Echocardiogram: 09/2012 Study Conclusions   - Left ventricle: Upper septal thickening. There is akinesis  of the inferolateral wall with suggestion of some  calcification of this wall. The cavity size was normal.  The estimated ejection fraction was 55%. Features are  consistent with a pseudonormal left ventricular filling  pattern, with concomitant abnormal relaxation and  increased filling pressure (grade 2 diastolic  dysfunction). Doppler parameters are consistent with high  ventricular filling pressure.  - Aortic valve: Trivial regurgitation.  - Aorta: Slight dilitation of aortic root. Ascending aorta  not dilated. Aortic root dimension: 61mm (ED). Ascending  aortic diameter: 67mm (S).  - Mitral valve: Mild regurgitation.  - Left atrium: The atrium was mildly dilated.  - Right ventricle: The cavity size was mildly dilated.  Systolic function was mildly reduced.  - Pulmonary arteries: PA peak pressure: 60mm Hg (S).    AAA Duplex: 02/2020 Summary:  Abdominal Aorta: There is evidence of abnormal dilatation of the mid  Abdominal aorta. The largest aortic measurement is 5.2 cm. The largest  aortic diameter has increased compared to prior exam. Previous diameter  measurement was 5.0 cm obtained on  07/04/2019.   Assessment:    1. Coronary artery disease of bypass graft of native heart with stable angina pectoris ([place])   2. Medication management   3. Permanent atrial fibrillation ([place])   4. Essential hypertension   5. Hyperlipidemia LDL goal <70   6. AAA (abdominal aortic aneurysm) without rupture ([place])      Plan:   In order of problems listed above:  1. CAD  - He is s/p CABG in 1997 with patent grafts by cath in 2005. Most recent ischemic evaluation was a low-risk NST in 2012. - He has baseline dyspnea on exertion which occurs when  walking up inclines for long periods but says this has been stable for years and denies any acute change in his symptoms. No recent chest pain. Will continue with medical therapy for now including Atorvastatin 40 mg daily and Toprol-XL 25 mg daily. He is no longer on ASA given the need for anticoagulation. Would plan for a repeat stress test next year given 10 years since his last ischemic evaluation which was reviewed with the patient and his daughter today. If he warrants AAA repair prior to this, would recommend a Lexiscan Myoview for preoperative clearance.   2. Permanent Atrial Fibrillation - He denies any recent palpitations and heart rate is well controlled in the 70's during today's visit.  Continue Toprol-XL 25 mg daily for rate control. - He denies any evidence of active bleeding and remains on Eliquis 5 mg twice daily for anticoagulation. Will recheck a CBC and BMET with his upcoming labs.  3. HTN - BP is elevated at 142/68 during today's visit but he brings with him his home blood pressure machine and readings have actually been well controlled when checked at home with SBP typically in the 110's to 130's. Continue current medication regimen for now with Amlodipine 10 mg daily, Lisinopril 20 mg daily and Toprol-XL 25 mg daily.  4. HLD - FLP in 12/2019 showed total cholesterol 150, triglycerides 93, HDL 50 and LDL 81. He remains on  Atorvastatin 40 mg daily and is scheduled for repeat labs next month.  5. AAA  - Measured at 5.2 cm by imaging in 02/2020. Being followed by Vascular Surgery with plans for repeat imaging in 08/2020.    Medication Adjustments/Labs and Tests Ordered: Current medicines are reviewed at length with the patient today.  Concerns regarding medicines are outlined above.  Medication changes, Labs and Tests ordered today are listed in the Patient Instructions below. Patient Instructions  Medication Instructions:  Your physician recommends that you continue on your  current medications as directed. Please refer to the Current Medication list given to you today.  *If you need a refill on your cardiac medications before your next appointment, please call your pharmacy*   Lab Work: Your physician recommends that you return for lab work in: October with next blood draw.   If you have labs (blood work) drawn today and your tests are completely normal, you will receive your results only by: Marland Kitchen MyChart Message (if you have MyChart) OR . A paper copy in the mail If you have any lab test that is abnormal or we need to change your treatment, we will call you to review the results.   Testing/Procedures: NONE   Follow-Up: At [facility], you and your health needs are our priority.  As part of our continuing mission to provide you with exceptional heart care, we have created designated Provider Care Teams.  These Care Teams include your primary Cardiologist (physician) and Advanced Practice Providers (APPs -  Physician Assistants and Nurse Practitioners) who all work together to provide you with the care you need, when you need it.  We recommend signing up for the patient portal called "MyChart".  Sign up information is provided on this After Visit Summary.  MyChart is used to connect with patients for Virtual Visits (Telemedicine).  Patients are able to view lab/test results, encounter notes, upcoming appointments, etc.  Non-urgent messages can be sent to your provider as well.   To learn more about what you can do with MyChart, go to [email or web address].    Your next appointment:   6 month(s)  The format for your next appointment:   In Person  Provider:   Rozann Lesches, MD or Bernerd Pho, PA-C   Other Instructions Thank you for choosing [facility]!       Signed, Erma Heritage, PA-C  08/07/2020 5:02 PM    [place] [address] [place], [place] [address] Phone: [phone] Fax: [phone]

## 2020-08-07 NOTE — Patient Instructions (Signed)
Medication Instructions:  Your physician recommends that you continue on your current medications as directed. Please refer to the Current Medication list given to you today.  *If you need a refill on your cardiac medications before your next appointment, please call your pharmacy*   Lab Work: Your physician recommends that you return for lab work in: October with next blood draw.   If you have labs (blood work) drawn today and your tests are completely normal, you will receive your results only by: Marland Kitchen MyChart Message (if you have MyChart) OR . A paper copy in the mail If you have any lab test that is abnormal or we need to change your treatment, we will call you to review the results.   Testing/Procedures: NONE   Follow-Up: At [facility], you and your health needs are our priority.  As part of our continuing mission to provide you with exceptional heart care, we have created designated Provider Care Teams.  These Care Teams include your primary Cardiologist (physician) and Advanced Practice Providers (APPs -  Physician Assistants and Nurse Practitioners) who all work together to provide you with the care you need, when you need it.  We recommend signing up for the patient portal called "MyChart".  Sign up information is provided on this After Visit Summary.  MyChart is used to connect with patients for Virtual Visits (Telemedicine).  Patients are able to view lab/test results, encounter notes, upcoming appointments, etc.  Non-urgent messages can be sent to your provider as well.   To learn more about what you can do with MyChart, go to [email or web address].    Your next appointment:   6 month(s)  The format for your next appointment:   In Person  Provider:   Rozann Lesches, MD or Bernerd Pho, PA-C   Other Instructions Thank you for choosing [facility]!

## 2020-08-18 ENCOUNTER — Other Ambulatory Visit: Payer: Self-pay | Admitting: Endocrinology

## 2020-09-03 DIAGNOSIS — C61 Malignant neoplasm of prostate: Secondary | ICD-10-CM | POA: Diagnosis not present

## 2020-09-10 ENCOUNTER — Other Ambulatory Visit: Payer: Self-pay

## 2020-09-10 ENCOUNTER — Encounter: Payer: Self-pay | Admitting: Vascular Surgery

## 2020-09-10 ENCOUNTER — Ambulatory Visit (HOSPITAL_COMMUNITY)
Admission: RE | Admit: 2020-09-10 | Discharge: 2020-09-10 | Disposition: A | Discharge status: Home / Self Care | Payer: Medicare Other | Source: Ambulatory Visit | Attending: Vascular Surgery | Admitting: Vascular Surgery

## 2020-09-10 ENCOUNTER — Ambulatory Visit (INDEPENDENT_AMBULATORY_CARE_PROVIDER_SITE_OTHER): Payer: Medicare Other | Admitting: Vascular Surgery

## 2020-09-10 VITALS — BP 165/84 | HR 61 | Temp 97.5°F | Resp 18 | Ht 72.0 in | Wt 195.0 lb

## 2020-09-10 DIAGNOSIS — I714 Abdominal aortic aneurysm, without rupture, unspecified: Secondary | ICD-10-CM

## 2020-09-10 NOTE — Progress Notes (Signed)
Patient name: Steve Bailey MRN: [id number] DOB: 20-Jun-1934 Sex: male  REASON FOR VISIT: 6 month follow-up for known AAA   HPI: Steve Bailey is a 86 y.o. male with history of diabetes, hypertension, hyperlipidemia, prostate cancer, A. Fib on eliquis, known abdominal aortic aneurysm, coronary disease status post CABG that presents for another 6 month follow-up for surveillance of AAA.  Patient has had a known aneurysm since 2017 when this was discovered incidentally on a CT after a fall.  At the time it was about 4.4 cm.  This has been followed by surveillance.  He reports no new abdominal .  Has some chronic lower back pain that is intermittent and improving since he stopped gardening.  No hx of abdominal surgery. Based on duplex imaging aneurysm was 4.6 cm on 06/28/2018 again 4.6 cm on 01/03/2019 and then 5 cm on 07/04/2019.  Duplex 6 months ago showed it was 5.2 cm.  He reports no other changes to his health since I saw him last.    Past Medical History:  Diagnosis Date  . AAA (abdominal aortic aneurysm) ([place]) followed by dr Bridgett Larsson   last duplex 06/28/2018 4.6 cm,  asymptomatic  . Anticoagulant long-term use    eliquis  . Benign localized prostatic hyperplasia with lower urinary tract symptoms (LUTS)   . CAD (coronary artery disease) cardiologist-  dr Bronson Ing   remote CABG in 1997; prior PCI to the LAD i n1990; Nuclear study in February of 2012 normal with an EF of 60%   . DDD (degenerative disc disease), cervical   . Full dentures   . History of radiation therapy 11/24/12-01/18/13   Prostate 78Gy/38fx  . HOH (hard of hearing)    both  . Hyperlipidemia   . Hyperlipidemia   . Hypertension   . Permanent atrial fibrillation ([place]) 09/2012   followed by dr Bronson Ing  . Phimosis    severe  . Proliferative diabetic retinopathy ([place])    both eyes  . Prostate cancer [facility]) urologist-- dr Tresa Moore   dx 09-02-2012-- Stage T2a, Gleason 3+4=7, PSA 8.12, vol 103 cc--- completed external beam radiation 02/  2014;  recurrent 2015 , started hormone therapy  . S/P CABG x 3 1997  . Strains to urinate   . Type 2 diabetes mellitus treated with insulin [facility])    endocrinologist-- dr Dwyane Dee    Past Surgical History:  Procedure Laterality Date  . CARDIAC CATHETERIZATION  01-18-2004   dr Doreatha Lew   patent grafts, ef 55%, minimal anterior hypokinesis,  severe total occlusion of LCFx and LAD,  severe disease of D2 and segmental RCA  . CARDIOVASCULAR STRESS TEST  01/07/2011   normal nuclear study w/ no ischemia/  normal LV function and wall motion,  ef 60%  . CARPAL TUNNEL RELEASE Right   . CATARACT EXTRACTION W/ INTRAOCULAR LENS  IMPLANT, BILATERAL  left 2006;  right 2010  . CIRCUMCISION N/A 07/28/2018   Procedure: CIRCUMCISION ADULT;  Surgeon: Alexis Frock, MD;  Location: [facility];  Service: Urology;  Laterality: N/A;  . 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 CORONARY ANGIOPLASTY  1990   LAD  . CORONARY ARTERY BYPASS GRAFT  11-1995   shows distal LAD disease, without recurrent symptoms of angina with questionable mild apical ischemia but with normal EF   . ESOPHAGOGASTRODUODENOSCOPY (EGD) WITH ESOPHAGEAL DILATION  05/2012  . KNEE ARTHROSCOPY Left   . TRANSTHORACIC ECHOCARDIOGRAM  10/10/2012   ef 55%, akinesis of the inferolateral wall,  grade 2 diastolic dysfunction/  trivial AR/  mild MR and TR/  mild LAE/  mild RVSF reduced and mild dilated RV    Family History  Problem Relation Age of Onset  . Stroke Father   . Heart attack Mother   . Heart disease Mother   . Cancer Sister        lung  . Cancer Brother        prostate  . Diabetes Brother   . Stroke Brother   . Stroke Brother   . Heart disease Brother   . Heart disease Brother   . Heart attack Sister   . Heart disease Sister   . Heart attack Sister   . Cancer Sister        breast  . Heart disease Sister   . Heart attack Sister   . Heart disease Sister   . Heart disease Sister     SOCIAL HISTORY: Social History    Tobacco Use  . Smoking status: Former Smoker    Years: 20.00    Types: Cigarettes    Quit date: 10/14/1979    Years since quitting: 40.9  . Smokeless tobacco: Former Systems developer    Types: Chew    Quit date: 11/23/2012  Substance Use Topics  . Alcohol use: No    Allergies  Allergen Reactions  . Cephalexin Nausea And Vomiting    Current Outpatient Medications  Medication Sig Dispense Refill  . acetaminophen (TYLENOL) 500 MG tablet Take 1,300 mg by mouth at bedtime.     Marland Kitchen alfuzosin (UROXATRAL) 10 MG 24 hr tablet Take 10 mg by mouth at bedtime. Reported on 12/18/2015    . amLODipine (NORVASC) 10 MG tablet TAKE 1 TABLET BY MOUTH EVERY DAY 90 tablet 3  . atorvastatin (LIPITOR) 40 MG tablet Take 1 tablet (40 mg total) by mouth daily. 90 tablet 3  . Cinnamon 500 MG capsule Take 500 mg by mouth 2 (two) times daily.    Marland Kitchen ELIQUIS 5 MG TABS tablet TAKE 1 TABLET BY MOUTH TWICE A DAY 180 tablet 2  . finasteride (PROSCAR) 5 MG tablet Take 5 mg by mouth every morning. Reported on 12/18/2015    . glucose blood (FREESTYLE LITE) test strip USE TO TEST TWICE A DAY 100 strip 1  . insulin isophane & regular human (HUMULIN 70/30 KWIKPEN) (70-30) 100 UNIT/ML KwikPen Inject 20 Units into the skin in the morning and at bedtime. 45 mL 1  . Insulin Pen Needle (PEN NEEDLES) 31G X 5 MM MISC 1 each by Does not apply route 2 (two) times daily. 90 each 2  . lisinopril (ZESTRIL) 20 MG tablet Take 1 tablet (20 mg total) by mouth daily. 90 tablet 3  . metFORMIN (GLUCOPHAGE-XR) 750 MG 24 hr tablet TAKE 2 TABLETS BY MOUTH DAILY WITH BREAKFAST. 180 tablet 0  . metoprolol succinate (TOPROL-XL) 25 MG 24 hr tablet TAKE 1 TABLET BY MOUTH EVERY DAY 90 tablet 1  . Omega-3 Fatty Acids (OMEGA-3 FISH OIL PO) Take 1 capsule by mouth daily.    . prednisoLONE acetate (PRED FORTE) 1 % ophthalmic suspension PLACE 1 DROP INTO THE RIGHT EYE DAILY.    . traMADol (ULTRAM) 50 MG tablet Take 1 tablet (50 mg total) by mouth every 6 (six) hours as  needed for moderate pain. Post-operatively 15 tablet 0   No current facility-administered medications for this visit.    REVIEW OF SYSTEMS:  [X]  denotes positive finding, [ ]  denotes negative finding Cardiac  Comments:  Chest pain or chest  pressure:    Shortness of breath upon exertion:    Short of breath when lying flat:    Irregular heart rhythm:        Vascular    Pain in calf, thigh, or hip brought on by ambulation:    Pain in feet at night that wakes you up from your sleep:     Blood clot in your veins:    Leg swelling:         Pulmonary    Oxygen at home:    Productive cough:     Wheezing:         Neurologic    Sudden weakness in arms or legs:     Sudden numbness in arms or legs:     Sudden onset of difficulty speaking or slurred speech:    Temporary loss of vision in one eye:     Problems with dizziness:         Gastrointestinal    Blood in stool:     Vomited blood:         Genitourinary    Burning when urinating:     Blood in urine:        Psychiatric    Major depression:         Hematologic    Bleeding problems:    Problems with blood clotting too easily:        Skin    Rashes or ulcers:        Constitutional    Fever or chills:      PHYSICAL EXAM: Vitals:   09/10/20 0828  BP: (!) 165/84  Pulse: 61  Resp: 18  Temp: (!) 97.5 F (36.4 C)  TempSrc: Temporal  Weight: 195 lb (88.5 kg)  Height: 6' (1.829 m)    GENERAL: The patient is a well-nourished male, in no acute distress. The vital signs are documented above. CARDIAC: There is a regular rate and rhythm.  VASCULAR:  2+ palpable femoral pulse bilaterally No appreciable pedal pulses on exam, but feet warm PULMONARY: No respiratory distress. ABDOMEN: Soft and non-tender.  No pain with palpation of aneurysm. MUSCULOSKELETAL: There are no major deformities or cyanosis. NEUROLOGIC: No focal weakness or paresthesias are detected. SKIN: There are no ulcers or rashes noted. PSYCHIATRIC: The  patient has a normal affect.  DATA:   Reviewed his AAA duplex from today and aneurysm is stable, maximum diameter obtained today 5.1 cm.  Assessment/Plan:  86 year old male has been followed for known abdominal aortic aneurysm presents for ongoing 108-month follow-up.  This was initially discovered in 2017.  The AAA last measured 5.2 cm on CT 6 months ago.  On 15-month follow-up today now measures around 5.1.  Discussed no evidence of significant growth.  Discussed that current guidelines are to repair these at greater than 5.5 cm unless there is rapid growth or he becomes symptomatic.  No aneurysm symptoms at this time.  I recommend a follow-up again in 6 months with another AAA duplex.    Marty Heck, MD [facility] Office: [phone]

## 2020-09-12 ENCOUNTER — Other Ambulatory Visit: Payer: Self-pay | Admitting: Endocrinology

## 2020-09-13 ENCOUNTER — Other Ambulatory Visit: Payer: Self-pay | Admitting: Endocrinology

## 2020-09-16 ENCOUNTER — Other Ambulatory Visit: Payer: Self-pay | Admitting: Endocrinology

## 2020-09-16 ENCOUNTER — Other Ambulatory Visit (INDEPENDENT_AMBULATORY_CARE_PROVIDER_SITE_OTHER): Payer: Medicare Other

## 2020-09-16 ENCOUNTER — Other Ambulatory Visit: Payer: Self-pay

## 2020-09-16 DIAGNOSIS — E538 Deficiency of other specified B group vitamins: Secondary | ICD-10-CM | POA: Diagnosis not present

## 2020-09-16 DIAGNOSIS — D649 Anemia, unspecified: Secondary | ICD-10-CM

## 2020-09-16 DIAGNOSIS — E119 Type 2 diabetes mellitus without complications: Secondary | ICD-10-CM | POA: Diagnosis not present

## 2020-09-16 LAB — COMPREHENSIVE METABOLIC PANEL
ALT: 11 U/L (ref 0–53)
AST: 15 U/L (ref 0–37)
Albumin: 4.2 g/dL (ref 3.5–5.2)
Alkaline Phosphatase: 72 U/L (ref 39–117)
BUN: 17 mg/dL (ref 6–23)
CO2: 27 mEq/L (ref 19–32)
Calcium: 9.5 mg/dL (ref 8.4–10.5)
Chloride: 103 mEq/L (ref 96–112)
Creatinine, Ser: 1.05 mg/dL (ref 0.40–1.50)
GFR: 65.24 mL/min (ref >60.00)
Glucose, Bld: 148 mg/dL — ABNORMAL HIGH (ref 70–99)
Potassium: 4.7 mEq/L (ref 3.5–5.1)
Sodium: 139 mEq/L (ref 135–145)
Total Bilirubin: 0.9 mg/dL (ref 0.2–1.2)
Total Protein: 6.6 g/dL (ref 6.0–8.3)

## 2020-09-16 LAB — CBC
HCT: 35.6 % — ABNORMAL LOW (ref 39.0–52.0)
Hemoglobin: 12.1 g/dL — ABNORMAL LOW (ref 13.0–17.0)
MCHC: 33.9 g/dL (ref 30.0–36.0)
MCV: 94.7 fl (ref 78.0–100.0)
Platelets: 160 10*3/uL (ref 150.0–400.0)
RBC: 3.76 Mil/uL — ABNORMAL LOW (ref 4.22–5.81)
RDW: 14.6 % (ref 11.5–15.5)
WBC: 3.8 10*3/uL — ABNORMAL LOW (ref 4.0–10.5)

## 2020-09-16 LAB — TSH: TSH: 4.46 u[IU]/mL (ref 0.35–4.50)

## 2020-09-16 LAB — HEMOGLOBIN A1C: Hgb A1c MFr Bld: 6.8 % — ABNORMAL HIGH (ref 4.6–6.5)

## 2020-09-17 DIAGNOSIS — C61 Malignant neoplasm of prostate: Secondary | ICD-10-CM | POA: Diagnosis not present

## 2020-09-17 DIAGNOSIS — Z5111 Encounter for antineoplastic chemotherapy: Secondary | ICD-10-CM | POA: Diagnosis not present

## 2020-09-17 DIAGNOSIS — R3912 Poor urinary stream: Secondary | ICD-10-CM | POA: Diagnosis not present

## 2020-09-19 ENCOUNTER — Encounter: Payer: Self-pay | Admitting: Endocrinology

## 2020-09-19 ENCOUNTER — Ambulatory Visit (INDEPENDENT_AMBULATORY_CARE_PROVIDER_SITE_OTHER): Payer: Medicare Other | Admitting: Endocrinology

## 2020-09-19 ENCOUNTER — Other Ambulatory Visit: Payer: Self-pay

## 2020-09-19 VITALS — BP 138/78 | HR 63 | Wt 198.0 lb

## 2020-09-19 DIAGNOSIS — I25708 Atherosclerosis of coronary artery bypass graft(s), unspecified, with other forms of angina pectoris: Secondary | ICD-10-CM

## 2020-09-19 DIAGNOSIS — Z23 Encounter for immunization: Secondary | ICD-10-CM | POA: Diagnosis not present

## 2020-09-19 DIAGNOSIS — I10 Essential (primary) hypertension: Secondary | ICD-10-CM | POA: Diagnosis not present

## 2020-09-19 DIAGNOSIS — E1151 Type 2 diabetes mellitus with diabetic peripheral angiopathy without gangrene: Secondary | ICD-10-CM | POA: Diagnosis not present

## 2020-09-19 NOTE — Progress Notes (Signed)
Patient ID: Steve Bailey, male   DOB: [birth year], 86 y.o.   MRN: [id number]    Reason for Appointment:  follow-up of various problems  History of Present Illness    Diagnosis: Type 2 DIABETES MELITUS, date of diagnosis: 1995              History:     Insulin regimen: Humulin 70/30 KwikPen,  20 units  a.c. twice a day   Oral hypoglycemic drugs: Metformin ER 750 mg, 2 tabs daily   His A1c is 6.8 compared to 6.5 and gradually increasing  Current management, blood sugar patterns and problems:  He did not check blood sugars as directed and only has 1 reading from last month on review of his monitor  His daughter is with him and is giving most of the history  He is not clear why his A1c is higher and unable to determine whether his blood sugars are high at any given time  Fasting glucose was 148 in the lab  Weight is about the same  No symptoms of hypoglycemia  He is trying to be relatively active with outside work  Glucometer: Freestyle  Blood sugar readings not available    Carbohydrate intake: at meals: Mostly with vegetables, also getting some lean meats like Chicken and pork Egg, bacon, grits in am    Weight history:  Wt Readings from Last 3 Encounters:  09/19/20 198 lb (89.8 kg)  09/10/20 195 lb (88.5 kg)  08/07/20 198 lb 12.8 oz (90.2 kg)      Lab Results  Component Value Date   HGBA1C 6.8 (H) 09/16/2020   HGBA1C 6.5 05/10/2020   HGBA1C 6.0 (A) 01/15/2020   Lab Results  Component Value Date   MICROALBUR 14.7 (H) 01/15/2020   LDLCALC 81 01/15/2020   CREATININE 1.05 09/16/2020    Other active problems: See review of systems    Allergies as of 09/19/2020      Reactions   Cephalexin Nausea And Vomiting      Medication List       Accurate as of September 19, 2020  3:39 PM. If you have any questions, ask your nurse or doctor.        acetaminophen 500 MG tablet Commonly known as: TYLENOL Take 1,300 mg by mouth at bedtime.    alfuzosin 10 MG 24 hr tablet Commonly known as: UROXATRAL Take 10 mg by mouth at bedtime. Reported on 12/18/2015   amLODipine 10 MG tablet Commonly known as: NORVASC TAKE 1 TABLET BY MOUTH EVERY DAY   atorvastatin 40 MG tablet Commonly known as: LIPITOR Take 1 tablet (40 mg total) by mouth daily.   Cinnamon 500 MG capsule Take 500 mg by mouth 2 (two) times daily.   Eliquis 5 MG Tabs tablet Generic drug: apixaban TAKE 1 TABLET BY MOUTH TWICE A DAY   finasteride 5 MG tablet Commonly known as: PROSCAR Take 5 mg by mouth every morning. Reported on 12/18/2015   FREESTYLE LITE test strip Generic drug: glucose blood USE TO TEST TWICE A DAY   HumuLIN 70/30 KwikPen (70-30) 100 UNIT/ML KwikPen Generic drug: insulin isophane & regular human Inject 20 Units into the skin in the morning and at bedtime.   lisinopril 20 MG tablet Commonly known as: ZESTRIL Take 1 tablet (20 mg total) by mouth daily.   metFORMIN 750 MG 24 hr tablet Commonly known as: GLUCOPHAGE-XR TAKE 2 TABLETS BY MOUTH DAILY WITH BREAKFAST.   metoprolol succinate 25 MG 24  hr tablet Commonly known as: TOPROL-XL TAKE 1 TABLET BY MOUTH EVERY DAY   OMEGA-3 FISH OIL PO Take 1 capsule by mouth daily.   Pen Needles 31G X 5 MM Misc 1 each by Does not apply route 2 (two) times daily.   prednisoLONE acetate 1 % ophthalmic suspension Commonly known as: PRED FORTE PLACE 1 DROP INTO THE RIGHT EYE DAILY.   traMADol 50 MG tablet Commonly known as: Ultram Take 1 tablet (50 mg total) by mouth every 6 (six) hours as needed for moderate pain. Post-operatively       Allergies:  Allergies  Allergen Reactions  . Cephalexin Nausea And Vomiting    Past Medical History:  Diagnosis Date  . AAA (abdominal aortic aneurysm) ([place]) followed by dr Bridgett Larsson   last duplex 06/28/2018 4.6 cm,  asymptomatic  . Anticoagulant long-term use    eliquis  . Benign localized prostatic hyperplasia with lower urinary tract symptoms (LUTS)   .  CAD (coronary artery disease) cardiologist-  dr Bronson Ing   remote CABG in 1997; prior PCI to the LAD i n1990; Nuclear study in February of 2012 normal with an EF of 60%   . DDD (degenerative disc disease), cervical   . Full dentures   . History of radiation therapy 11/24/12-01/18/13   Prostate 78Gy/4fx  . HOH (hard of hearing)    both  . Hyperlipidemia   . Hyperlipidemia   . Hypertension   . Permanent atrial fibrillation ([place]) 09/2012   followed by dr Bronson Ing  . Phimosis    severe  . Proliferative diabetic retinopathy ([place])    both eyes  . Prostate cancer [facility]) urologist-- dr Tresa Moore   dx 09-02-2012-- Stage T2a, Gleason 3+4=7, PSA 8.12, vol 103 cc--- completed external beam radiation 02/ 2014;  recurrent 2015 , started hormone therapy  . S/P CABG x 3 1997  . Strains to urinate   . Type 2 diabetes mellitus treated with insulin [facility])    endocrinologist-- dr Dwyane Dee    Past Surgical History:  Procedure Laterality Date  . CARDIAC CATHETERIZATION  01-18-2004   dr Doreatha Lew   patent grafts, ef 55%, minimal anterior hypokinesis,  severe total occlusion of LCFx and LAD,  severe disease of D2 and segmental RCA  . CARDIOVASCULAR STRESS TEST  01/07/2011   normal nuclear study w/ no ischemia/  normal LV function and wall motion,  ef 60%  . CARPAL TUNNEL RELEASE Right   . CATARACT EXTRACTION W/ INTRAOCULAR LENS  IMPLANT, BILATERAL  left 2006;  right 2010  . CIRCUMCISION N/A 07/28/2018   Procedure: CIRCUMCISION ADULT;  Surgeon: Alexis Frock, MD;  Location: [facility];  Service: Urology;  Laterality: N/A;  . 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 CORONARY ANGIOPLASTY  1990   LAD  . CORONARY ARTERY BYPASS GRAFT  11-1995   shows distal LAD disease, without recurrent symptoms of angina with questionable mild apical ischemia but with normal EF   . ESOPHAGOGASTRODUODENOSCOPY (EGD) WITH ESOPHAGEAL DILATION  05/2012  . KNEE ARTHROSCOPY Left   . TRANSTHORACIC ECHOCARDIOGRAM  10/10/2012    ef 55%, akinesis of the inferolateral wall,  grade 2 diastolic dysfunction/  trivial AR/  mild MR and TR/  mild LAE/  mild RVSF reduced and mild dilated RV    Family History  Problem Relation Age of Onset  . Stroke Father   . Heart attack Mother   . Heart disease Mother   . Cancer Sister        lung  .  Cancer Brother        prostate  . Diabetes Brother   . Stroke Brother   . Stroke Brother   . Heart disease Brother   . Heart disease Brother   . Heart attack Sister   . Heart disease Sister   . Heart attack Sister   . Cancer Sister        breast  . Heart disease Sister   . Heart attack Sister   . Heart disease Sister   . Heart disease Sister     Social History:  reports that he quit smoking about 40 years ago. His smoking use included cigarettes. He quit after 20.00 years of use. He quit smokeless tobacco use about 7 years ago.  His smokeless tobacco use included chew. He reports that he does not drink alcohol and does not use drugs.  ROS   HYPERTENSION:   Has been present for several years.    He has been treated with lisinopril 20 mg, amlodipine 10mg , and metoprolol 25 mg His amlodipine has been prescribed by cardiologist  He has a blood pressure meter at home, unknown brand However review of history shows variable readings, some up to 177 systolic Blood pressure relatively better today    BP Readings from Last 3 Encounters:  09/19/20 138/78  09/10/20 (!) 165/84  08/07/20 (!) 142/68          HYPERLIPIDEMIA:  This is well-controlled with 40 mg Lipitor prescribed by his cardiologist Labs as follows:   Lab Results  Component Value Date   CHOL 150 01/15/2020   HDL 50.00 01/15/2020   LDLCALC 81 01/15/2020   TRIG 93.0 01/15/2020   CHOLHDL 3 01/15/2020    Diabetic foot exam done in 6/21 showed normal monofilament sensation and absent pulses on the left  He is being followed by vascular surgeon for aortic aneurysm  He is being followed regularly by [facility] ophthalmology, has corneal dystrophy and macular edema  He refuses to take the Covid vaccine   Mild chronic anemia, he also has mild leukopenia and has not had follow-up with his PCP  Lab Results  Component Value Date   WBC 3.8 (L) 09/16/2020   HGB 12.1 (L) 09/16/2020   HCT 35.6 (L) 09/16/2020   MCV 94.7 09/16/2020   PLT 160.0 09/16/2020      Examination:   BP 138/78   Pulse 63   Wt 198 lb (89.8 kg)   SpO2 99%   BMI 26.85 kg/m   Body mass index is 26.85 kg/m.     Assesment/PLAN:  DIABETES, Type II on insulin:  See history of present illness for  discussion of current diabetes management and problems identified  His A1c is 6.8, previously 6.5   He has been on the same dose of 20 units premixed insulin twice a day along with metformin ER 1500 mg long-term  Blood sugars not being monitored at home Considering his age and duration of diabetes as well as comorbid conditions he is doing very well No hypoglycemia Again unable to adjust his insulin dose without having blood sugar readings at home We will continue 20 units twice daily for now and advised him to take this before starting to eat To call if he has any unusual episodes of low sugars   HYPERTENSION: Blood pressure is relatively better today His blood pressure appears to be fluctuating and occasionally higher at home also, to continue working with cardiologist for this  Mild leukopenia: We will defer  management to PCP, reports poorly  LIPIDS: To be followed up by cardiology  Again recommended Covid vaccine but he refused  He agrees to take the flu shot today  Follow-up in 4 months   There are no Patient Instructions on file for this visit.   Elayne Snare 09/19/2020, 3:39 PM

## 2020-09-23 ENCOUNTER — Telehealth: Payer: Self-pay | Admitting: Student

## 2020-09-23 NOTE — Telephone Encounter (Signed)
Daughter notified to speak with PCP or ordering provider.

## 2020-09-23 NOTE — Telephone Encounter (Signed)
NEW MESSAGE    Patient had lab work done last week and his daughter has that his WBC was low, is this something that they need to be concerned about and monitor?

## 2020-10-17 ENCOUNTER — Other Ambulatory Visit: Payer: Self-pay | Admitting: Endocrinology

## 2020-11-24 ENCOUNTER — Other Ambulatory Visit: Payer: Self-pay | Admitting: Endocrinology

## 2020-12-16 ENCOUNTER — Other Ambulatory Visit: Payer: Self-pay | Admitting: Endocrinology

## 2020-12-27 ENCOUNTER — Encounter: Payer: Self-pay | Admitting: Student

## 2020-12-27 ENCOUNTER — Encounter: Payer: Self-pay | Admitting: *Deleted

## 2020-12-27 ENCOUNTER — Other Ambulatory Visit: Payer: Self-pay

## 2020-12-27 ENCOUNTER — Ambulatory Visit (INDEPENDENT_AMBULATORY_CARE_PROVIDER_SITE_OTHER): Payer: Medicare Other | Admitting: Student

## 2020-12-27 VITALS — BP 138/76 | HR 61 | Ht 72.0 in | Wt 195.0 lb

## 2020-12-27 DIAGNOSIS — I4821 Permanent atrial fibrillation: Secondary | ICD-10-CM | POA: Diagnosis not present

## 2020-12-27 DIAGNOSIS — I10 Essential (primary) hypertension: Secondary | ICD-10-CM

## 2020-12-27 DIAGNOSIS — E785 Hyperlipidemia, unspecified: Secondary | ICD-10-CM

## 2020-12-27 DIAGNOSIS — I25708 Atherosclerosis of coronary artery bypass graft(s), unspecified, with other forms of angina pectoris: Secondary | ICD-10-CM | POA: Diagnosis not present

## 2020-12-27 DIAGNOSIS — I714 Abdominal aortic aneurysm, without rupture, unspecified: Secondary | ICD-10-CM

## 2020-12-27 NOTE — Patient Instructions (Signed)
Medication Instructions:  Your physician recommends that you continue on your current medications as directed. Please refer to the Current Medication list given to you today.  *If you need a refill on your cardiac medications before your next appointment, please call your pharmacy*   Lab Work: NONE   If you have labs (blood work) drawn today and your tests are completely normal, you will receive your results only by: Marland Kitchen MyChart Message (if you have MyChart) OR . A paper copy in the mail If you have any lab test that is abnormal or we need to change your treatment, we will call you to review the results.   Testing/Procedures: Your physician has requested that you have a lexiscan myoview. For further information please visit [email or web address]. Please follow instruction sheet, as given.   Follow-Up: At [facility], you and your health needs are our priority.  As part of our continuing mission to provide you with exceptional heart care, we have created designated Provider Care Teams.  These Care Teams include your primary Cardiologist (physician) and Advanced Practice Providers (APPs -  Physician Assistants and Nurse Practitioners) who all work together to provide you with the care you need, when you need it.  We recommend signing up for the patient portal called "MyChart".  Sign up information is provided on this After Visit Summary.  MyChart is used to connect with patients for Virtual Visits (Telemedicine).  Patients are able to view lab/test results, encounter notes, upcoming appointments, etc.  Non-urgent messages can be sent to your provider as well.   To learn more about what you can do with MyChart, go to [email or web address].    Your next appointment:   6 month(s)  The format for your next appointment:   In Person  Provider:   Rozann Lesches, MD or Bernerd Pho, PA-C   Other Instructions Thank you for choosing [facility]!

## 2020-12-27 NOTE — Progress Notes (Signed)
Cardiology Office Note    Date:  12/27/2020   ID:  Steve Bailey, DOB 1934/07/06, MRN [id number]  PCP:  Pieter Partridge, PA  Cardiologist: Rozann Lesches, MD    Chief Complaint  Patient presents with  . Follow-up    6 month visit    History of Present Illness:    Steve Bailey is a 87 y.o. male with past medical history of CAD (s/p CABG in 1997, patent grafts by cath in 2005, low-risk NST in 2012), permanent atrial fibrillation, HTN, HLD, IDDM and AAA (at 5.0 cm in 06/2019 and 08/2019, at 5.2 cm by imaging in 02/2020 and 5.1 cm in 08/2020) who presents to the office today for 101-month follow-up.  He was last examined by myself in 07/2020 and remained active at baseline and denied any chest pain or palpitations. He was continued on his current medication regimen and it was recommended to consider repeat stress testing next year given it had been 10 years since his prior ischemic evaluation.  In talking with the patient and his daughter today, he reports overall doing well since his last visit. He enjoys being active outside but has not been able to do so recently given the weather. Remains active doing chores in the house (cooking, cleaning, washing clothes). He denies any recent chest pain or dyspnea on exertion. No recent orthopnea, PND or lower extremity edema.    Past Medical History:  Diagnosis Date  . AAA (abdominal aortic aneurysm) ([place]) followed by dr Bridgett Larsson   last duplex 06/28/2018 4.6 cm,  asymptomatic  . Anticoagulant long-term use    eliquis  . Benign localized prostatic hyperplasia with lower urinary tract symptoms (LUTS)   . CAD (coronary artery disease) cardiologist-  dr Bronson Ing   remote CABG in 1997; prior PCI to the LAD i n1990; Nuclear study in February of 2012 normal with an EF of 60%   . DDD (degenerative disc disease), cervical   . Full dentures   . History of radiation therapy 11/24/12-01/18/13   Prostate 78Gy/53fx  . HOH (hard of hearing)    both  .  Hyperlipidemia   . Hyperlipidemia   . Hypertension   . Permanent atrial fibrillation ([place]) 09/2012   followed by dr Bronson Ing  . Phimosis    severe  . Proliferative diabetic retinopathy ([place])    both eyes  . Prostate cancer [facility]) urologist-- dr Tresa Moore   dx 09-02-2012-- Stage T2a, Gleason 3+4=7, PSA 8.12, vol 103 cc--- completed external beam radiation 02/ 2014;  recurrent 2015 , started hormone therapy  . S/P CABG x 3 1997  . Strains to urinate   . Type 2 diabetes mellitus treated with insulin [facility])    endocrinologist-- dr Dwyane Dee    Past Surgical History:  Procedure Laterality Date  . CARDIAC CATHETERIZATION  01-18-2004   dr Doreatha Lew   patent grafts, ef 55%, minimal anterior hypokinesis,  severe total occlusion of LCFx and LAD,  severe disease of D2 and segmental RCA  . CARDIOVASCULAR STRESS TEST  01/07/2011   normal nuclear study w/ no ischemia/  normal LV function and wall motion,  ef 60%  . CARPAL TUNNEL RELEASE Right   . CATARACT EXTRACTION W/ INTRAOCULAR LENS  IMPLANT, BILATERAL  left 2006;  right 2010  . CIRCUMCISION N/A 07/28/2018   Procedure: CIRCUMCISION ADULT;  Surgeon: Alexis Frock, MD;  Location: [facility];  Service: Urology;  Laterality: N/A;  . 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 [place]  LAD  . CORONARY ARTERY BYPASS GRAFT  11-1995   shows distal LAD disease, without recurrent symptoms of angina with questionable mild apical ischemia but with normal EF   . ESOPHAGOGASTRODUODENOSCOPY (EGD) WITH ESOPHAGEAL DILATION  05/2012  . KNEE ARTHROSCOPY Left   . TRANSTHORACIC ECHOCARDIOGRAM  10/10/2012   ef 55%, akinesis of the inferolateral wall,  grade 2 diastolic dysfunction/  trivial AR/  mild MR and TR/  mild LAE/  mild RVSF reduced and mild dilated RV    Current Medications: Outpatient Medications Prior to Visit  Medication Sig Dispense Refill  . acetaminophen (TYLENOL) 500 MG tablet Take 1,300 mg by mouth at bedtime.    Marland Kitchen  alfuzosin (UROXATRAL) 10 MG 24 hr tablet Take 10 mg by mouth at bedtime. Reported on 12/18/2015    . amLODipine (NORVASC) 10 MG tablet TAKE 1 TABLET BY MOUTH EVERY DAY 90 tablet 3  . atorvastatin (LIPITOR) 40 MG tablet Take 1 tablet (40 mg total) by mouth daily. 90 tablet 3  . Cinnamon 500 MG capsule Take 500 mg by mouth 2 (two) times daily.    Marland Kitchen ELIQUIS 5 MG TABS tablet TAKE 1 TABLET BY MOUTH TWICE A DAY 180 tablet 2  . finasteride (PROSCAR) 5 MG tablet Take 5 mg by mouth every morning. Reported on 12/18/2015    . FREESTYLE LITE test strip USE TO TEST TWICE A DAY 100 strip 1  . HUMULIN 70/30 KWIKPEN (70-30) 100 UNIT/ML KwikPen INJECT 20 UNITS INTO THE SKIN IN THE MORNING AND AT BEDTIME. 15 mL 1  . Insulin Pen Needle (PEN NEEDLES) 31G X 5 MM MISC 1 each by Does not apply route 2 (two) times daily. 90 each 2  . lisinopril (ZESTRIL) 20 MG tablet Take 1 tablet (20 mg total) by mouth daily. 90 tablet 3  . metFORMIN (GLUCOPHAGE-XR) 750 MG 24 hr tablet TAKE 2 TABLETS BY MOUTH DAILY WITH BREAKFAST. 180 tablet 1  . metoprolol succinate (TOPROL-XL) 25 MG 24 hr tablet TAKE 1 TABLET BY MOUTH EVERY DAY 90 tablet 1  . Omega-3 Fatty Acids (OMEGA-3 FISH OIL PO) Take 1 capsule by mouth daily.    . prednisoLONE acetate (PRED FORTE) 1 % ophthalmic suspension PLACE 1 DROP INTO THE RIGHT EYE DAILY.    . traMADol (ULTRAM) 50 MG tablet Take 1 tablet (50 mg total) by mouth every 6 (six) hours as needed for moderate pain. Post-operatively 15 tablet 0   No facility-administered medications prior to visit.     Allergies:   Cephalexin   Social History   Socioeconomic History  . Marital status: Widowed    Spouse name: Not on file  . Number of children: Not on file  . Years of education: Not on file  . Highest education level: Not on file  Occupational History  . Not on file  Tobacco Use  . Smoking status: Former Smoker    Years: 20.00    Types: Cigarettes    Quit date: 10/14/1979    Years since quitting: 41.2   . Smokeless tobacco: Former Systems developer    Types: Chew    Quit date: 11/23/2012  Vaping Use  . Vaping Use: Never used  Substance and Sexual Activity  . Alcohol use: No  . Drug use: No  . Sexual activity: Not on file  Other Topics Concern  . Not on file  Social History Narrative  . Not on file   Social Determinants of Health   Financial Resource Strain: Not on file  Food  Insecurity: Not on file  Transportation Needs: Not on file  Physical Activity: Not on file  Stress: Not on file  Social Connections: Not on file     Family History:  The patient's family history includes Cancer in his brother, sister, and sister; Diabetes in his brother; Heart attack in his mother, sister, sister, and sister; Heart disease in his brother, brother, mother, sister, sister, sister, and sister; Stroke in his brother, brother, and father.   Review of Systems:   Please see the history of present illness.     General:  No chills, fever, night sweats or weight changes.  Cardiovascular:  No chest pain, dyspnea on exertion, edema, orthopnea, palpitations, paroxysmal nocturnal dyspnea. Dermatological: No rash, lesions/masses Respiratory: No cough, dyspnea Urologic: No hematuria, dysuria Abdominal:   No nausea, vomiting, diarrhea, bright red blood per rectum, melena, or hematemesis Neurologic:  No visual changes, wkns, changes in mental status. All other systems reviewed and are otherwise negative except as noted above.   Physical Exam:    VS:  BP 138/76   Pulse 61   Ht 6' (1.829 m)   Wt 195 lb (88.5 kg)   SpO2 100%   BMI 26.45 kg/m    General: Well developed, well nourished,male appearing in no acute distress. Head: Normocephalic, atraumatic. Neck: No carotid bruits. JVD not elevated.  Lungs: Respirations regular and unlabored, without wheezes or rales.  Heart: Irregularly irregular. No S3 or S4.  No murmur, no rubs, or gallops appreciated. Abdomen: Appears non-distended. No obvious abdominal  masses. Msk:  Strength and tone appear normal for age. No obvious joint deformities or effusions. Extremities: No clubbing or cyanosis. No edema.  Distal pedal pulses are 2+ bilaterally. Neuro: Alert and oriented X 3. Moves all extremities spontaneously. No focal deficits noted. Psych:  Responds to questions appropriately with a normal affect. Skin: No rashes or lesions noted  Wt Readings from Last 3 Encounters:  12/27/20 195 lb (88.5 kg)  09/19/20 198 lb (89.8 kg)  09/10/20 195 lb (88.5 kg)        Studies/Labs Reviewed:   EKG:  EKG is ordered today. The EKG ordered today demonstrates rate-controlled atrial fibrillation, HR 61 with no acute ST changes.   Recent Labs: 09/16/2020: ALT 11; BUN 17; Creatinine, Ser 1.05; Hemoglobin 12.1; Platelets 160.0; Potassium 4.7; Sodium 139; TSH 4.46   Lipid Panel    Component Value Date/Time   CHOL 150 01/15/2020 1618   TRIG 93.0 01/15/2020 1618   HDL 50.00 01/15/2020 1618   CHOLHDL 3 01/15/2020 1618   VLDL 18.6 01/15/2020 1618   LDLCALC 81 01/15/2020 1618    Additional studies/ records that were reviewed today include:   Echocardiogram: 09/2012 Study Conclusions   - Left ventricle: Upper septal thickening. There is akinesis  of the inferolateral wall with suggestion of some  calcification of this wall. The cavity size was normal.  The estimated ejection fraction was 55%. Features are  consistent with a pseudonormal left ventricular filling  pattern, with concomitant abnormal relaxation and  increased filling pressure (grade 2 diastolic  dysfunction). Doppler parameters are consistent with high  ventricular filling pressure.  - Aortic valve: Trivial regurgitation.  - Aorta: Slight dilitation of aortic root. Ascending aorta  not dilated. Aortic root dimension: 23mm (ED). Ascending  aortic diameter: 97mm (S).  - Mitral valve: Mild regurgitation.  - Left atrium: The atrium was mildly dilated.  - Right  ventricle: The cavity size was mildly dilated.  Systolic function was mildly reduced.  -  Pulmonary arteries: PA peak pressure: 57mm Hg (S).   AAA [place]: 08/2020 Summary:  Abdominal Aorta: There is evidence of abnormal dilatation of the distal  Abdominal aorta. Previous diameter measurement was 4.7 x 5.21 cm obtained  on 03/12/20.     Assessment:    1. Coronary artery disease of bypass graft of native heart with stable angina pectoris ([place])   2. Permanent atrial fibrillation ([place])   3. Essential hypertension   4. Hyperlipidemia LDL goal <70   5. AAA (abdominal aortic aneurysm) without rupture ([place])      Plan:   In order of problems listed above:  1. CAD - He is s/p CABG in 1997 with patent grafts by cath in 2005. Most recent ischemic evaluation was a low-risk NST in 2012. He denies any recent anginal symptoms but given that it has been 10 years since his last ischemic evaluation, will order a Lexiscan Myoview for reassessment.  - Continue Atorvastatin 40mg  daily and Toprol-XL 25mg  daily. He is not on ASA given the need for anticoagulation.   2. Permanent Atrial Fibrillation - He denies any recent palpitations and his HR has been well-controlled when checked at home. Continue Toprol-XL 25mg  daily.  - He denies any evidence of active bleeding. Hgb was stable at 12.1 and platelets at 160 K in 08/2020. Continue Eliquis 5mg  BID for anticoagulation.   3. HTN - BP is well-controlled at 138/76 during today's visit and he reports SBP is typically in the 120's to 130's when checked at home. Continue current medication regimen.   4. HLD - LDL was at 81 in 2021. He remains on Atorvastatin 40mg  daily and Fish Oil.   5. AAA - Followed by Vascular Surgery. Measured 5.1 cm by most recent imaging.    Shared Decision Making/Informed Consent:   Shared Decision Making/Informed Consent The risks [chest pain, shortness of breath, cardiac arrhythmias, dizziness, blood pressure fluctuations,  myocardial infarction, stroke/transient ischemic attack, nausea, vomiting, allergic reaction, radiation exposure, metallic taste sensation and life-threatening complications (estimated to be 1 in 10,000)], benefits (risk stratification, diagnosing coronary artery disease, treatment guidance) and alternatives of a nuclear stress test were discussed in detail with Steve Bailey and he agrees to proceed.       Medication Adjustments/Labs and Tests Ordered: Current medicines are reviewed at length with the patient today.  Concerns regarding medicines are outlined above.  Medication changes, Labs and Tests ordered today are listed in the Patient Instructions below. Patient Instructions  Medication Instructions:  Your physician recommends that you continue on your current medications as directed. Please refer to the Current Medication list given to you today.  *If you need a refill on your cardiac medications before your next appointment, please call your pharmacy*   Lab Work: NONE   If you have labs (blood work) drawn today and your tests are completely normal, you will receive your results only by: Marland Kitchen MyChart Message (if you have MyChart) OR . A paper copy in the mail If you have any lab test that is abnormal or we need to change your treatment, we will call you to review the results.   Testing/Procedures: Your physician has requested that you have a lexiscan myoview. For further information please visit [email or web address]. Please follow instruction sheet, as given.   Follow-Up: At [facility], you and your health needs are our priority.  As part of our continuing mission to provide you with exceptional heart care, we have created designated Provider Care Teams.  These Care Teams  include your primary Cardiologist (physician) and Advanced Practice Providers (APPs -  Physician Assistants and Nurse Practitioners) who all work together to provide you with the care you need, when you need  it.  We recommend signing up for the patient portal called "MyChart".  Sign up information is provided on this After Visit Summary.  MyChart is used to connect with patients for Virtual Visits (Telemedicine).  Patients are able to view lab/test results, encounter notes, upcoming appointments, etc.  Non-urgent messages can be sent to your provider as well.   To learn more about what you can do with MyChart, go to [email or web address].    Your next appointment:   6 month(s)  The format for your next appointment:   In Person  Provider:   Rozann Lesches, MD or Bernerd Pho, PA-C   Other Instructions Thank you for choosing [facility]!       Signed, Erma Heritage, PA-C  12/27/2020 7:53 PM    [place] [address] [place], [place] [address] Phone: [phone] Fax: [phone]

## 2021-01-01 ENCOUNTER — Other Ambulatory Visit (HOSPITAL_COMMUNITY): Payer: Medicare Other

## 2021-01-03 ENCOUNTER — Encounter (HOSPITAL_COMMUNITY): Payer: Medicare Other

## 2021-01-03 ENCOUNTER — Other Ambulatory Visit (HOSPITAL_COMMUNITY): Payer: Medicare Other

## 2021-01-10 ENCOUNTER — Other Ambulatory Visit: Payer: Self-pay

## 2021-01-10 ENCOUNTER — Telehealth: Payer: Self-pay

## 2021-01-10 ENCOUNTER — Encounter (HOSPITAL_COMMUNITY): Payer: Self-pay

## 2021-01-10 ENCOUNTER — Ambulatory Visit (HOSPITAL_COMMUNITY)
Admission: RE | Admit: 2021-01-10 | Discharge: 2021-01-10 | Disposition: A | Discharge status: Home / Self Care | Payer: Medicare Other | Source: Ambulatory Visit | Attending: Student | Admitting: Student

## 2021-01-10 ENCOUNTER — Encounter (HOSPITAL_BASED_OUTPATIENT_CLINIC_OR_DEPARTMENT_OTHER)
Admission: RE | Admit: 2021-01-10 | Discharge: 2021-01-10 | Disposition: A | Discharge status: Home / Self Care | Payer: Medicare Other | Source: Ambulatory Visit | Attending: Student | Admitting: Student

## 2021-01-10 DIAGNOSIS — I25708 Atherosclerosis of coronary artery bypass graft(s), unspecified, with other forms of angina pectoris: Secondary | ICD-10-CM

## 2021-01-10 LAB — NM MYOCAR MULTI W/SPECT W/WALL MOTION / EF
LV dias vol: 117 mL (ref 62–150)
LV sys vol: 48 mL
Peak HR: 82 {beats}/min
RATE: 0.34
Rest HR: 64 {beats}/min
SDS: 2
SRS: 3
SSS: 5
TID: 0.98

## 2021-01-10 MED ORDER — SODIUM CHLORIDE FLUSH 0.9 % IV SOLN
INTRAVENOUS | Status: AC
  Administered 2021-01-10: 10 mL via INTRAVENOUS
  Filled 2021-01-10: qty 10

## 2021-01-10 MED ORDER — TECHNETIUM TC 99M TETROFOSMIN IV KIT
10.0000 | PACK | Freq: Once | INTRAVENOUS | Status: AC | PRN
  Administered 2021-01-10: 10 via INTRAVENOUS

## 2021-01-10 MED ORDER — REGADENOSON 0.4 MG/5ML IV SOLN
INTRAVENOUS | Status: AC
  Administered 2021-01-10: 0.4 mg via INTRAVENOUS
  Filled 2021-01-10: qty 5

## 2021-01-10 MED ORDER — TECHNETIUM TC 99M TETROFOSMIN IV KIT
30.0000 | PACK | Freq: Once | INTRAVENOUS | Status: AC | PRN
  Administered 2021-01-10: 30.5 via INTRAVENOUS

## 2021-01-10 NOTE — Telephone Encounter (Signed)
Spoke to patients daughter and gave her results of stress test. She voiced understanding and had no questions or concerns at this time.

## 2021-01-10 NOTE — Telephone Encounter (Signed)
-----   Message from Erma Heritage, [place] sent at 01/10/2021  2:57 PM EST ----- Please let the patient and his daughter know that his stress test was low risk and showed no evidence of significant blockages.

## 2021-01-21 DIAGNOSIS — E113513 Type 2 diabetes mellitus with proliferative diabetic retinopathy with macular edema, bilateral: Secondary | ICD-10-CM | POA: Diagnosis not present

## 2021-01-21 DIAGNOSIS — H18513 Endothelial corneal dystrophy, bilateral: Secondary | ICD-10-CM | POA: Diagnosis not present

## 2021-01-21 DIAGNOSIS — Z947 Corneal transplant status: Secondary | ICD-10-CM | POA: Diagnosis not present

## 2021-01-21 DIAGNOSIS — H353221 Exudative age-related macular degeneration, left eye, with active choroidal neovascularization: Secondary | ICD-10-CM | POA: Diagnosis not present

## 2021-01-21 DIAGNOSIS — Z961 Presence of intraocular lens: Secondary | ICD-10-CM | POA: Diagnosis not present

## 2021-01-21 LAB — HM DIABETES EYE EXAM

## 2021-01-23 ENCOUNTER — Ambulatory Visit: Payer: Medicare Other | Admitting: Endocrinology

## 2021-01-24 ENCOUNTER — Ambulatory Visit (INDEPENDENT_AMBULATORY_CARE_PROVIDER_SITE_OTHER): Payer: Medicare Other | Admitting: Endocrinology

## 2021-01-24 ENCOUNTER — Encounter: Payer: Self-pay | Admitting: Endocrinology

## 2021-01-24 ENCOUNTER — Other Ambulatory Visit: Payer: Self-pay

## 2021-01-24 VITALS — BP 148/64 | HR 66 | Ht 72.0 in | Wt 201.0 lb

## 2021-01-24 DIAGNOSIS — I25708 Atherosclerosis of coronary artery bypass graft(s), unspecified, with other forms of angina pectoris: Secondary | ICD-10-CM

## 2021-01-24 DIAGNOSIS — D649 Anemia, unspecified: Secondary | ICD-10-CM | POA: Diagnosis not present

## 2021-01-24 DIAGNOSIS — E7800 Pure hypercholesterolemia, unspecified: Secondary | ICD-10-CM

## 2021-01-24 DIAGNOSIS — E1151 Type 2 diabetes mellitus with diabetic peripheral angiopathy without gangrene: Secondary | ICD-10-CM | POA: Diagnosis not present

## 2021-01-24 DIAGNOSIS — I700 Atherosclerosis of aorta: Secondary | ICD-10-CM | POA: Diagnosis not present

## 2021-01-24 LAB — CBC WITH DIFFERENTIAL/PLATELET
Basophils Absolute: 0 10*3/uL (ref 0.0–0.1)
Basophils Relative: 0.9 % (ref 0.0–3.0)
Eosinophils Absolute: 0.1 10*3/uL (ref 0.0–0.7)
Eosinophils Relative: 3.2 % (ref 0.0–5.0)
HCT: 34 % — ABNORMAL LOW (ref 39.0–52.0)
Hemoglobin: 11.6 g/dL — ABNORMAL LOW (ref 13.0–17.0)
Lymphocytes Relative: 22.1 % (ref 12.0–46.0)
Lymphs Abs: 0.9 10*3/uL (ref 0.7–4.0)
MCHC: 34.2 g/dL (ref 30.0–36.0)
MCV: 95.9 fl (ref 78.0–100.0)
Monocytes Absolute: 0.3 10*3/uL (ref 0.1–1.0)
Monocytes Relative: 7.7 % (ref 3.0–12.0)
Neutro Abs: 2.7 10*3/uL (ref 1.4–7.7)
Neutrophils Relative %: 66.1 % (ref 43.0–77.0)
Platelets: 174 10*3/uL (ref 150.0–400.0)
RBC: 3.55 Mil/uL — ABNORMAL LOW (ref 4.22–5.81)
RDW: 14.7 % (ref 11.5–15.5)
WBC: 4 10*3/uL (ref 4.0–10.5)

## 2021-01-24 LAB — URINALYSIS, ROUTINE W REFLEX MICROSCOPIC
Bilirubin Urine: NEGATIVE
Hgb urine dipstick: NEGATIVE
Leukocytes,Ua: NEGATIVE
Nitrite: NEGATIVE
Specific Gravity, Urine: 1.02 (ref 1.000–1.030)
Total Protein, Urine: 30 — AB
Urine Glucose: 100 — AB
Urobilinogen, UA: 1 (ref 0.0–1.0)
pH: 6 (ref 5.0–8.0)

## 2021-01-24 LAB — LIPID PANEL
Cholesterol: 140 mg/dL (ref 0–200)
HDL: 56.8 mg/dL (ref >39.00)
LDL Cholesterol: 69 mg/dL (ref 0–99)
NonHDL: 82.79
Total CHOL/HDL Ratio: 2
Triglycerides: 71 mg/dL (ref 0.0–149.0)
VLDL: 14.2 mg/dL (ref 0.0–40.0)

## 2021-01-24 LAB — COMPREHENSIVE METABOLIC PANEL
ALT: 14 U/L (ref 0–53)
AST: 17 U/L (ref 0–37)
Albumin: 3.8 g/dL (ref 3.5–5.2)
Alkaline Phosphatase: 70 U/L (ref 39–117)
BUN: 19 mg/dL (ref 6–23)
CO2: 30 mEq/L (ref 19–32)
Calcium: 9.1 mg/dL (ref 8.4–10.5)
Chloride: 102 mEq/L (ref 96–112)
Creatinine, Ser: 1.04 mg/dL (ref 0.40–1.50)
GFR: 65.83 mL/min (ref >60.00)
Glucose, Bld: 239 mg/dL — ABNORMAL HIGH (ref 70–99)
Potassium: 4.6 mEq/L (ref 3.5–5.1)
Sodium: 139 mEq/L (ref 135–145)
Total Bilirubin: 0.7 mg/dL (ref 0.2–1.2)
Total Protein: 6.4 g/dL (ref 6.0–8.3)

## 2021-01-24 LAB — MICROALBUMIN / CREATININE URINE RATIO
Creatinine,U: 130.3 mg/dL
Microalb Creat Ratio: 14.3 mg/g (ref 0.0–30.0)
Microalb, Ur: 18.6 mg/dL — ABNORMAL HIGH (ref 0.0–1.9)

## 2021-01-24 LAB — POCT GLYCOSYLATED HEMOGLOBIN (HGB A1C): Hemoglobin A1C: 6.2 % — AB (ref 4.0–5.6)

## 2021-01-24 LAB — POCT GLUCOSE (DEVICE FOR HOME USE): POC Glucose: 235 mg/dl — AB (ref 70–99)

## 2021-01-24 NOTE — Progress Notes (Addendum)
Patient ID: Steve Bailey, male   DOB: 1934/06/06, 87 y.o.   MRN: [id number]    Reason for Appointment:  follow-up of various problems  History of Present Illness    Diagnosis: Type 2 DIABETES MELITUS, date of diagnosis: 1995              History:     Insulin regimen: Humulin 70/30 KwikPen,  20 units  a.c. twice a day   Oral hypoglycemic drugs: Metformin ER 750 mg, 2 tabs daily   His A1c is 6.2, previously 6.8  Current management, blood sugar patterns and problems: He says he may check his sugars occasionally and they are about 130-140 Previously he would be checking his blood sugars very rarely when he will bring his monitor He appears to have gained weight over the last month Again his daughter thinks that he is regular with his insulin before eating breakfast and dinner and she will sometimes supervise him Although his blood sugar was significantly higher today he thinks he did not forget his insulin Usually will eat eggs, toast and bacon at breakfast but today had a biscuit and grits also Usually not very active during winter months No symptoms of hypoglycemia reported lately No GI side effects of Metformin  Glucometer: Freestyle  Blood sugar readings not available    Carbohydrate intake: at meals: Mostly with vegetables, also getting some lean meats like Chicken and pork Egg, bacon, grits in am    Weight history:  Wt Readings from Last 3 Encounters:  01/24/21 201 lb (91.2 kg)  12/27/20 195 lb (88.5 kg)  09/19/20 198 lb (89.8 kg)      Lab Results  Component Value Date   HGBA1C 6.2 (A) 01/24/2021   HGBA1C 6.8 (H) 09/16/2020   HGBA1C 6.5 05/10/2020   Lab Results  Component Value Date   MICROALBUR 14.7 (H) 01/15/2020   LDLCALC 81 01/15/2020   CREATININE 1.05 09/16/2020    Other active problems: See review of systems    Allergies as of 01/24/2021       Reactions   Cephalexin Nausea And Vomiting        Medication List         Accurate as of January 24, 2021  2:24 PM. If you have any questions, ask your nurse or doctor.          acetaminophen 500 MG tablet Commonly known as: TYLENOL Take 1,300 mg by mouth at bedtime.   alfuzosin 10 MG 24 hr tablet Commonly known as: UROXATRAL Take 10 mg by mouth at bedtime. Reported on 12/18/2015   amLODipine 10 MG tablet Commonly known as: NORVASC TAKE 1 TABLET BY MOUTH EVERY DAY   atorvastatin 40 MG tablet Commonly known as: LIPITOR Take 1 tablet (40 mg total) by mouth daily.   Cinnamon 500 MG capsule Take 500 mg by mouth 2 (two) times daily.   Eliquis 5 MG Tabs tablet Generic drug: apixaban TAKE 1 TABLET BY MOUTH TWICE A DAY   finasteride 5 MG tablet Commonly known as: PROSCAR Take 5 mg by mouth every morning. Reported on 12/18/2015   FREESTYLE LITE test strip Generic drug: glucose blood USE TO TEST TWICE A DAY   HumuLIN 70/30 KwikPen (70-30) 100 UNIT/ML KwikPen Generic drug: insulin isophane & regular human INJECT 20 UNITS INTO THE SKIN IN THE MORNING AND AT BEDTIME.   lisinopril 20 MG tablet Commonly known as: ZESTRIL Take 1 tablet (20 mg total) by mouth daily.   metFORMIN  750 MG 24 hr tablet Commonly known as: GLUCOPHAGE-XR TAKE 2 TABLETS BY MOUTH DAILY WITH BREAKFAST.   metoprolol succinate 25 MG 24 hr tablet Commonly known as: TOPROL-XL TAKE 1 TABLET BY MOUTH EVERY DAY   OMEGA-3 FISH OIL PO Take 1 capsule by mouth daily.   Pen Needles 31G X 5 MM Misc 1 each by Does not apply route 2 (two) times daily.   prednisoLONE acetate 1 % ophthalmic suspension Commonly known as: PRED FORTE PLACE 1 DROP INTO THE RIGHT EYE DAILY.   traMADol 50 MG tablet Commonly known as: Ultram Take 1 tablet (50 mg total) by mouth every 6 (six) hours as needed for moderate pain. Post-operatively        Allergies:  Allergies  Allergen Reactions   Cephalexin Nausea And Vomiting    Past Medical History:  Diagnosis Date   AAA (abdominal aortic aneurysm)  ([place]) followed by dr Bridgett Larsson   last duplex 06/28/2018 4.6 cm,  asymptomatic   Anticoagulant long-term use    eliquis   Benign localized prostatic hyperplasia with lower urinary tract symptoms (LUTS)    CAD (coronary artery disease) cardiologist-  dr Bronson Ing   remote CABG in 1997; prior PCI to the LAD i n1990; Nuclear study in February of 2012 normal with an EF of 60%    DDD (degenerative disc disease), cervical    Full dentures    History of radiation therapy 11/24/12-01/18/13   Prostate 78Gy/60fx   HOH (hard of hearing)    both   Hyperlipidemia    Hyperlipidemia    Hypertension    Permanent atrial fibrillation ([place]) 09/2012   followed by dr Bronson Ing   Phimosis    severe   Proliferative diabetic retinopathy ([place])    both eyes   Prostate cancer [facility]) urologist-- dr Tresa Moore   dx 09-02-2012-- Stage T2a, Gleason 3+4=7, PSA 8.12, vol 103 cc--- completed external beam radiation 02/ 2014;  recurrent 2015 , started hormone therapy   S/P CABG x 3 1997   Strains to urinate    Type 2 diabetes mellitus treated with insulin [facility])    endocrinologist-- dr Dwyane Dee    Past Surgical History:  Procedure Laterality Date   CARDIAC CATHETERIZATION  01-18-2004   dr Doreatha Lew   patent grafts, ef 55%, minimal anterior hypokinesis,  severe total occlusion of LCFx and LAD,  severe disease of D2 and segmental RCA   CARDIOVASCULAR STRESS TEST  01/07/2011   normal nuclear study w/ no ischemia/  normal LV function and wall motion,  ef 60%   CARPAL TUNNEL RELEASE Right    CATARACT EXTRACTION W/ INTRAOCULAR LENS  IMPLANT, BILATERAL  left 2006;  right 2010   CIRCUMCISION N/A 07/28/2018   Procedure: CIRCUMCISION ADULT;  Surgeon: Alexis Frock, MD;  Location: [facility];  Service: Urology;  Laterality: N/A;   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   CORONARY ANGIOPLASTY  1990   LAD   CORONARY ARTERY BYPASS GRAFT  11-1995   shows distal LAD disease, without recurrent symptoms of angina with questionable  mild apical ischemia but with normal EF    ESOPHAGOGASTRODUODENOSCOPY (EGD) WITH ESOPHAGEAL DILATION  05/2012   KNEE ARTHROSCOPY Left    TRANSTHORACIC ECHOCARDIOGRAM  10/10/2012   ef 55%, akinesis of the inferolateral wall,  grade 2 diastolic dysfunction/  trivial AR/  mild MR and TR/  mild LAE/  mild RVSF reduced and mild dilated RV    Family History  Problem Relation Age of Onset   Stroke Father  Heart attack Mother    Heart disease Mother    Cancer Sister        lung   Cancer Brother        prostate   Diabetes Brother    Stroke Brother    Stroke Brother    Heart disease Brother    Heart disease Brother    Heart attack Sister    Heart disease Sister    Heart attack Sister    Cancer Sister        breast   Heart disease Sister    Heart attack Sister    Heart disease Sister    Heart disease Sister     Social History:  reports that he quit smoking about 41 years ago. His smoking use included cigarettes. He quit after 20.00 years of use. He quit smokeless tobacco use about 8 years ago.  His smokeless tobacco use included chew. He reports that he does not drink alcohol and does not use drugs.  ROS   HYPERTENSION:   Has been present for several years.    He has been treated with lisinopril 20 mg, amlodipine 10mg , and metoprolol 25 mg His amlodipine has been prescribed by cardiologist  He has a blood pressure meter at home, unknown brand Blood pressure slightly higher today but better last month with cardiologist   BP Readings from Last 3 Encounters:  01/24/21 (!) 148/64  12/27/20 138/76  09/19/20 138/78          HYPERLIPIDEMIA:  Historically well controlled with 40 mg Lipitor prescribed by his cardiologist Labs as follows:   Lab Results  Component Value Date   CHOL 150 01/15/2020   HDL 50.00 01/15/2020   LDLCALC 81 01/15/2020   TRIG 93.0 01/15/2020   CHOLHDL 3 01/15/2020    Diabetic foot exam done in 6/21 showed normal monofilament sensation and absent  pulses on the left  He is being followed by vascular surgeon for aortic aneurysm  He is being followed regularly by [facility] ophthalmology, has corneal dystrophy and macular edema as well as retinopathy  He refuses to take the Covid vaccine     Examination:   BP (!) 148/64   Pulse 66   Ht 6' (1.829 m)   Wt 201 lb (91.2 kg)   SpO2 99%   BMI 27.26 kg/m   Body mass index is 27.26 kg/m.     Assesment/PLAN:  DIABETES, Type II on insulin:  See history of present illness for  discussion of current diabetes management and problems identified  His A1c is excellent at 6.2  He has been on the same dose of 20 units premixed insulin twice a day along with metformin ER 1500 mg long-term  He is doing fairly well with taking his insulin on time Again no hypoglycemia despite variable diet and activity level Discussed that his blood sugar of 235 today is related to eating a high fat or high carbohydrate breakfast which is unusual for him He should generally try to limit his carbohydrates at breakfast To check blood sugars regularly since he does have a meter at home and alternate fasting and after meals He will continue taking 20 units of insulin before breakfast and dinner   HYPERTENSION: Blood pressure is generally controlled although slightly higher today He will continue to follow-up with cardiologist and also check at home, to call if consistently high  History of anemia and mild leukopenia: He has not followed up with his PCP and will repeat his  labs   LIPIDS: Since he is overdue for labs will check his lipid panel today Current prescription of Lipitor by her cardiologist  Follow-up in 4 months   There are no Patient Instructions on file for this visit.   Elayne Snare 01/24/2021, 2:24 PM

## 2021-01-27 ENCOUNTER — Telehealth: Payer: Self-pay

## 2021-01-27 MED ORDER — ATORVASTATIN CALCIUM 40 MG PO TABS: 40.0000 mg | ORAL_TABLET | Freq: Every day | ORAL | 3 refills | Status: DC

## 2021-01-27 NOTE — Telephone Encounter (Signed)
Medication refill request for Atorvastatin 40mg  tablet approved and sent to the pharmacy.

## 2021-01-31 ENCOUNTER — Telehealth: Payer: Self-pay | Admitting: Endocrinology

## 2021-01-31 NOTE — Telephone Encounter (Signed)
FYI pt requests from now on a 90 day prescription for his Humulin [employer]

## 2021-02-05 DIAGNOSIS — H18513 Endothelial corneal dystrophy, bilateral: Secondary | ICD-10-CM | POA: Diagnosis not present

## 2021-02-05 DIAGNOSIS — Z947 Corneal transplant status: Secondary | ICD-10-CM | POA: Diagnosis not present

## 2021-02-05 DIAGNOSIS — E113513 Type 2 diabetes mellitus with proliferative diabetic retinopathy with macular edema, bilateral: Secondary | ICD-10-CM | POA: Diagnosis not present

## 2021-02-05 DIAGNOSIS — Z961 Presence of intraocular lens: Secondary | ICD-10-CM | POA: Diagnosis not present

## 2021-02-13 ENCOUNTER — Other Ambulatory Visit: Payer: Self-pay

## 2021-02-13 ENCOUNTER — Telehealth: Payer: Self-pay | Admitting: Endocrinology

## 2021-02-13 MED ORDER — HUMULIN 70/30 KWIKPEN (70-30) 100 UNIT/ML ~~LOC~~ SUPN: 20.0000 [IU] | PEN_INJECTOR | Freq: Two times a day (BID) | SUBCUTANEOUS | 1 refills | Status: DC

## 2021-02-13 NOTE — Telephone Encounter (Signed)
Rx sent 

## 2021-02-13 NOTE — Telephone Encounter (Signed)
MEDICATION:   HUMULIN 70/30 KWIKPEN (70-30) 100 UNIT/ML KwikPen  PHARMACY:    [id number] [place], [place] - [address] [place] Phone:  [phone]  Fax:  [phone]       HAS THE PATIENT CONTACTED THEIR PHARMACY? Yes-Previous RX request for 90 day supply was not received by PHARM   IS THIS A 90 DAY SUPPLY : Yes  IS PATIENT OUT OF MEDICATION: Yes  IF NOT; HOW MUCH IS LEFT: 0  LAST APPOINTMENT DATE: @3 /09/2021  NEXT APPOINTMENT DATE:@7 /03/2021  DO WE HAVE YOUR PERMISSION TO LEAVE A DETAILED MESSAGE?: Yes  OTHER COMMENTS:    **Let patient know to contact pharmacy at the end of the day to make sure medication is ready. **  ** Please notify patient to allow 48-72 hours to process**  **Encourage patient to contact the pharmacy for refills or they can request refills through [facility]**

## 2021-02-19 ENCOUNTER — Other Ambulatory Visit: Payer: Self-pay

## 2021-02-19 MED ORDER — HUMULIN 70/30 KWIKPEN (70-30) 100 UNIT/ML ~~LOC~~ SUPN: 20.0000 [IU] | PEN_INJECTOR | Freq: Two times a day (BID) | SUBCUTANEOUS | 2 refills | Status: DC

## 2021-02-19 NOTE — Telephone Encounter (Signed)
Patient is needing a 90 day supply. States that only 45 days worth was sent in . Can we send another Rx for 90 days. Patient is out of the medication can we send this in ASAP   Please call and advise

## 2021-03-03 ENCOUNTER — Other Ambulatory Visit: Payer: Self-pay

## 2021-03-03 DIAGNOSIS — I714 Abdominal aortic aneurysm, without rupture, unspecified: Secondary | ICD-10-CM

## 2021-03-03 DIAGNOSIS — D1721 Benign lipomatous neoplasm of skin and subcutaneous tissue of right arm: Secondary | ICD-10-CM | POA: Diagnosis not present

## 2021-03-03 DIAGNOSIS — E538 Deficiency of other specified B group vitamins: Secondary | ICD-10-CM | POA: Diagnosis not present

## 2021-03-03 DIAGNOSIS — D649 Anemia, unspecified: Secondary | ICD-10-CM | POA: Diagnosis not present

## 2021-03-03 DIAGNOSIS — R600 Localized edema: Secondary | ICD-10-CM | POA: Diagnosis not present

## 2021-03-03 DIAGNOSIS — I4821 Permanent atrial fibrillation: Secondary | ICD-10-CM | POA: Diagnosis not present

## 2021-03-06 ENCOUNTER — Other Ambulatory Visit: Payer: Self-pay

## 2021-03-14 DIAGNOSIS — C61 Malignant neoplasm of prostate: Secondary | ICD-10-CM | POA: Diagnosis not present

## 2021-03-14 DIAGNOSIS — E538 Deficiency of other specified B group vitamins: Secondary | ICD-10-CM | POA: Diagnosis not present

## 2021-03-15 ENCOUNTER — Other Ambulatory Visit: Payer: Self-pay | Admitting: Endocrinology

## 2021-03-17 ENCOUNTER — Other Ambulatory Visit: Payer: Self-pay | Admitting: *Deleted

## 2021-03-17 MED ORDER — ELIQUIS 5 MG PO TABS: 1.0000 | ORAL_TABLET | Freq: Two times a day (BID) | ORAL | 2 refills | Status: DC

## 2021-03-17 NOTE — Telephone Encounter (Signed)
Prescription refill request for Eliquis received. Indication: Atrial fib Last office visit: 12/27/20 Scr: 1.04 on 01/24/21 Age: 87 Weight: 88.5kg  Based on the above findings Eliquis 5mg  twice daily is the appropriate dose.  Refill approved.

## 2021-03-17 NOTE — Addendum Note (Signed)
Addended by: Malen Gauze on: 03/17/2021 08:45 AM   Modules accepted: Orders

## 2021-03-21 DIAGNOSIS — E538 Deficiency of other specified B group vitamins: Secondary | ICD-10-CM | POA: Diagnosis not present

## 2021-03-21 DIAGNOSIS — C61 Malignant neoplasm of prostate: Secondary | ICD-10-CM | POA: Diagnosis not present

## 2021-03-21 DIAGNOSIS — R3912 Poor urinary stream: Secondary | ICD-10-CM | POA: Diagnosis not present

## 2021-03-25 ENCOUNTER — Other Ambulatory Visit: Payer: Self-pay

## 2021-03-25 ENCOUNTER — Ambulatory Visit (INDEPENDENT_AMBULATORY_CARE_PROVIDER_SITE_OTHER): Payer: Medicare Other | Admitting: Vascular Surgery

## 2021-03-25 ENCOUNTER — Ambulatory Visit (HOSPITAL_COMMUNITY)
Admission: RE | Admit: 2021-03-25 | Discharge: 2021-03-25 | Disposition: A | Discharge status: Home / Self Care | Payer: Medicare Other | Source: Ambulatory Visit | Attending: Vascular Surgery | Admitting: Vascular Surgery

## 2021-03-25 ENCOUNTER — Encounter: Payer: Self-pay | Admitting: Vascular Surgery

## 2021-03-25 VITALS — BP 163/84 | HR 60 | Temp 97.4°F | Resp 16 | Ht 72.0 in | Wt 198.0 lb

## 2021-03-25 DIAGNOSIS — I714 Abdominal aortic aneurysm, without rupture, unspecified: Secondary | ICD-10-CM

## 2021-03-25 NOTE — Progress Notes (Signed)
Patient name: Steve Bailey MRN: [id number] DOB: Aug 29, 1934 Sex: male  REASON FOR VISIT: 6 month follow-up for known AAA   HPI: Steve Bailey is a 87 y.o. male with history of diabetes, hypertension, hyperlipidemia, prostate cancer, A. Fib on eliquis, known abdominal aortic aneurysm, coronary disease status post CABG that presents for another 6 month follow-up for surveillance of AAA.  He reports no changes to his health since last follow-up visit.  No new abdominal or back pain.  Patient has had a known aneurysm since 2017 when this was discovered incidentally on a CT after a fall.  At the time it was about 4.4 cm.  This has been followed by surveillance.   Based on duplex imaging aneurysm was 4.6 cm on 06/28/2018 again 4.6 cm on 01/03/2019 and then 5 cm on 07/04/2019.  Last duplex showed 5.1 cm 6 months ago.   Past Medical History:  Diagnosis Date  . AAA (abdominal aortic aneurysm) ([place]) followed by dr Bridgett Larsson   last duplex 06/28/2018 4.6 cm,  asymptomatic  . Anticoagulant long-term use    eliquis  . Benign localized prostatic hyperplasia with lower urinary tract symptoms (LUTS)   . CAD (coronary artery disease) cardiologist-  dr Bronson Ing   remote CABG in 1997; prior PCI to the LAD i n1990; Nuclear study in February of 2012 normal with an EF of 60%   . DDD (degenerative disc disease), cervical   . Full dentures   . History of radiation therapy 11/24/12-01/18/13   Prostate 78Gy/67fx  . HOH (hard of hearing)    both  . Hyperlipidemia   . Hyperlipidemia   . Hypertension   . Permanent atrial fibrillation ([place]) 09/2012   followed by dr Bronson Ing  . Phimosis    severe  . Proliferative diabetic retinopathy ([place])    both eyes  . Prostate cancer [facility]) urologist-- dr Tresa Moore   dx 09-02-2012-- Stage T2a, Gleason 3+4=7, PSA 8.12, vol 103 cc--- completed external beam radiation 02/ 2014;  recurrent 2015 , started hormone therapy  . S/P CABG x 3 1997  . Strains to urinate   . Type 2 diabetes mellitus  treated with insulin [facility])    endocrinologist-- dr Dwyane Dee    Past Surgical History:  Procedure Laterality Date  . CARDIAC CATHETERIZATION  01-18-2004   dr Doreatha Lew   patent grafts, ef 55%, minimal anterior hypokinesis,  severe total occlusion of LCFx and LAD,  severe disease of D2 and segmental RCA  . CARDIOVASCULAR STRESS TEST  01/07/2011   normal nuclear study w/ no ischemia/  normal LV function and wall motion,  ef 60%  . CARPAL TUNNEL RELEASE Right   . CATARACT EXTRACTION W/ INTRAOCULAR LENS  IMPLANT, BILATERAL  left 2006;  right 2010  . CIRCUMCISION N/A 07/28/2018   Procedure: CIRCUMCISION ADULT;  Surgeon: Alexis Frock, MD;  Location: [facility];  Service: Urology;  Laterality: N/A;  . 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 CORONARY ANGIOPLASTY  1990   LAD  . CORONARY ARTERY BYPASS GRAFT  11-1995   shows distal LAD disease, without recurrent symptoms of angina with questionable mild apical ischemia but with normal EF   . ESOPHAGOGASTRODUODENOSCOPY (EGD) WITH ESOPHAGEAL DILATION  05/2012  . KNEE ARTHROSCOPY Left   . TRANSTHORACIC ECHOCARDIOGRAM  10/10/2012   ef 55%, akinesis of the inferolateral wall,  grade 2 diastolic dysfunction/  trivial AR/  mild MR and TR/  mild LAE/  mild RVSF reduced and mild dilated RV    Family History  Problem Relation Age of Onset  . Stroke Father   . Heart attack Mother   . Heart disease Mother   . Cancer Sister        lung  . Cancer Brother        prostate  . Diabetes Brother   . Stroke Brother   . Stroke Brother   . Heart disease Brother   . Heart disease Brother   . Heart attack Sister   . Heart disease Sister   . Heart attack Sister   . Cancer Sister        breast  . Heart disease Sister   . Heart attack Sister   . Heart disease Sister   . Heart disease Sister     SOCIAL HISTORY: Social History   Tobacco Use  . Smoking status: Former Smoker    Years: 20.00    Types: Cigarettes    Quit date: 10/14/1979     Years since quitting: 41.4  . Smokeless tobacco: Former Systems developer    Types: Chew    Quit date: 11/23/2012  Substance Use Topics  . Alcohol use: No    Allergies  Allergen Reactions  . Cephalexin Nausea And Vomiting    Current Outpatient Medications  Medication Sig Dispense Refill  . acetaminophen (TYLENOL) 500 MG tablet Take 1,300 mg by mouth at bedtime.    Marland Kitchen alfuzosin (UROXATRAL) 10 MG 24 hr tablet Take 10 mg by mouth at bedtime. Reported on 12/18/2015    . amLODipine (NORVASC) 10 MG tablet TAKE 1 TABLET BY MOUTH EVERY DAY 90 tablet 3  . apixaban (ELIQUIS) 5 MG TABS tablet Take 1 tablet (5 mg total) by mouth 2 (two) times daily. 180 tablet 2  . atorvastatin (LIPITOR) 40 MG tablet Take 1 tablet (40 mg total) by mouth daily. 90 tablet 3  . Cinnamon 500 MG capsule Take 500 mg by mouth 2 (two) times daily.    . finasteride (PROSCAR) 5 MG tablet Take 5 mg by mouth every morning. Reported on 12/18/2015    . FREESTYLE LITE test strip USE TO TEST TWICE A DAY 100 strip 1  . insulin isophane & regular human (HUMULIN 70/30 KWIKPEN) (70-30) 100 UNIT/ML KwikPen Inject 20 Units into the skin in the morning and at bedtime. 45 mL 2  . Insulin Pen Needle (PEN NEEDLES) 31G X 5 MM MISC 1 each by Does not apply route 2 (two) times daily. 90 each 2  . lisinopril (ZESTRIL) 20 MG tablet Take 1 tablet (20 mg total) by mouth daily. 90 tablet 3  . metFORMIN (GLUCOPHAGE-XR) 750 MG 24 hr tablet TAKE 2 TABLETS BY MOUTH DAILY WITH BREAKFAST. 180 tablet 1  . metoprolol succinate (TOPROL-XL) 25 MG 24 hr tablet TAKE 1 TABLET BY MOUTH EVERY DAY 90 tablet 1  . Omega-3 Fatty Acids (OMEGA-3 FISH OIL PO) Take 1 capsule by mouth daily.    . prednisoLONE acetate (PRED FORTE) 1 % ophthalmic suspension PLACE 1 DROP INTO THE RIGHT EYE DAILY.    . traMADol (ULTRAM) 50 MG tablet Take 1 tablet (50 mg total) by mouth every 6 (six) hours as needed for moderate pain. Post-operatively 15 tablet 0   No current facility-administered medications  for this visit.    REVIEW OF SYSTEMS:  [X]  denotes positive finding, [ ]  denotes negative finding Cardiac  Comments:  Chest pain or chest pressure:    Shortness of breath upon exertion:    Short of breath when lying flat:    Irregular  heart rhythm:        Vascular    Pain in calf, thigh, or hip brought on by ambulation:    Pain in feet at night that wakes you up from your sleep:     Blood clot in your veins:    Leg swelling:         Pulmonary    Oxygen at home:    Productive cough:     Wheezing:         Neurologic    Sudden weakness in arms or legs:     Sudden numbness in arms or legs:     Sudden onset of difficulty speaking or slurred speech:    Temporary loss of vision in one eye:     Problems with dizziness:         Gastrointestinal    Blood in stool:     Vomited blood:         Genitourinary    Burning when urinating:     Blood in urine:        Psychiatric    Major depression:         Hematologic    Bleeding problems:    Problems with blood clotting too easily:        Skin    Rashes or ulcers:        Constitutional    Fever or chills:      PHYSICAL EXAM: Vitals:   03/25/21 0937  BP: (!) 163/84  Pulse: 60  Resp: 16  Temp: (!) 97.4 F (36.3 C)  TempSrc: Temporal  SpO2: 98%  Weight: 198 lb (89.8 kg)  Height: 6' (1.829 m)    GENERAL: The patient is a well-nourished male, in no acute distress. The vital signs are documented above. CARDIAC: There is a regular rate and rhythm.  VASCULAR:  2+ palpable femoral pulse bilaterally PULMONARY: No respiratory distress. ABDOMEN: Soft and non-tender.  No pain with palpation of aneurysm. MUSCULOSKELETAL: There are no major deformities or cyanosis. NEUROLOGIC: No focal weakness or paresthesias are detected. SKIN: There are no ulcers or rashes noted. PSYCHIATRIC: The patient has a normal affect.  DATA:   Reviewed his AAA duplex from today and aneurysm grown slightly from 5.1 to 5.28  cm.  Assessment/Plan:  87 year old male presents for 6 month follow-up of known abdominal aortic aneurysm.  His aneurysm has grown slightly from 5.1 cm to 5.28 cm by AAA duplex today.  He has no pain with palpation of the aneurysm.  Discussed that current guidelines are to repair these at greater than 5.5 cm in men unless there is rapid growth or it is felt to be causing symptoms.  I will plan to see him again in 6 months with AAA duplex here in the office.  Marty Heck, MD [facility] Office: [phone]

## 2021-03-27 ENCOUNTER — Other Ambulatory Visit: Payer: Self-pay

## 2021-03-27 DIAGNOSIS — I714 Abdominal aortic aneurysm, without rupture, unspecified: Secondary | ICD-10-CM

## 2021-03-28 DIAGNOSIS — E538 Deficiency of other specified B group vitamins: Secondary | ICD-10-CM | POA: Diagnosis not present

## 2021-04-04 DIAGNOSIS — E538 Deficiency of other specified B group vitamins: Secondary | ICD-10-CM | POA: Diagnosis not present

## 2021-04-04 DIAGNOSIS — D649 Anemia, unspecified: Secondary | ICD-10-CM | POA: Diagnosis not present

## 2021-04-28 ENCOUNTER — Other Ambulatory Visit: Payer: Self-pay | Admitting: *Deleted

## 2021-05-06 DIAGNOSIS — E538 Deficiency of other specified B group vitamins: Secondary | ICD-10-CM | POA: Diagnosis not present

## 2021-05-27 ENCOUNTER — Ambulatory Visit: Payer: Medicare Other | Admitting: Endocrinology

## 2021-05-27 ENCOUNTER — Encounter: Payer: Self-pay | Admitting: Endocrinology

## 2021-05-27 ENCOUNTER — Other Ambulatory Visit: Payer: Self-pay

## 2021-05-27 ENCOUNTER — Ambulatory Visit (INDEPENDENT_AMBULATORY_CARE_PROVIDER_SITE_OTHER): Payer: Medicare Other | Admitting: Endocrinology

## 2021-05-27 VITALS — BP 130/64 | HR 82 | Ht 72.0 in | Wt 199.2 lb

## 2021-05-27 DIAGNOSIS — Z794 Long term (current) use of insulin: Secondary | ICD-10-CM | POA: Diagnosis not present

## 2021-05-27 DIAGNOSIS — I10 Essential (primary) hypertension: Secondary | ICD-10-CM | POA: Diagnosis not present

## 2021-05-27 DIAGNOSIS — I25708 Atherosclerosis of coronary artery bypass graft(s), unspecified, with other forms of angina pectoris: Secondary | ICD-10-CM

## 2021-05-27 DIAGNOSIS — E1165 Type 2 diabetes mellitus with hyperglycemia: Secondary | ICD-10-CM

## 2021-05-27 LAB — POCT GLYCOSYLATED HEMOGLOBIN (HGB A1C): Hemoglobin A1C: 7 % — AB (ref 4.0–5.6)

## 2021-05-27 LAB — POCT GLUCOSE (DEVICE FOR HOME USE): POC Glucose: 219 mg/dl — AB (ref 70–99)

## 2021-05-27 MED ORDER — HUMULIN 70/30 KWIKPEN (70-30) 100 UNIT/ML ~~LOC~~ SUPN: 20.0000 [IU] | PEN_INJECTOR | Freq: Two times a day (BID) | SUBCUTANEOUS | 2 refills | Status: DC

## 2021-05-27 MED ORDER — METFORMIN HCL ER 750 MG PO TB24: 1500.0000 mg | ORAL_TABLET | Freq: Every day | ORAL | 1 refills | Status: DC

## 2021-05-27 MED ORDER — PEN NEEDLES 31G X 5 MM MISC: 1.0000 | Freq: Two times a day (BID) | 2 refills | Status: AC

## 2021-05-27 NOTE — Progress Notes (Signed)
Patient ID: Steve Bailey, male   DOB: 11-09-1934, 87 y.o.   MRN: [id number]    Reason for Appointment:  follow-up of various problems  History of Present Illness    Diagnosis: Type 2 DIABETES MELITUS, date of diagnosis: 1995              History:     Insulin regimen: Humulin 70/30 KwikPen,  20 units  a.c. twice a day   Oral hypoglycemic drugs: Metformin ER 750 mg, 2 tabs daily   His A1c is 7 compared to 6.2  Current management, blood sugar patterns and problems: He did not bring his blood sugar monitor again Most of his readings are by recall and his daughter is also checked his meter periodically  As before he is tending to have high readings after breakfast Again has high readings are usually from more carbohydrates or high fat meal in the morning with sausage and eggs along with some form of by bread Again occasionally may have high readings at home depending on what he is eating also may be eating more carbohydrate such as corn at times Otherwise his blood sugars may be 130 before meals Daughter tries to help him as much as possible with compliance Also she told him to switch from regular to low sugar Gatorade He thinks he is trying to take his insulin 15 minutes before eating No symptoms of hypoglycemia history again No GI side effects of Metformin  Glucometer: Freestyle  Blood sugar readings as above, rarely over 300   Carbohydrate intake: at meals: Mostly with vegetables, also getting some lean meats like Chicken and pork Egg, bacon, grits in am    Weight history:  Wt Readings from Last 3 Encounters:  05/27/21 199 lb 3.2 oz (90.4 kg)  03/25/21 198 lb (89.8 kg)  01/24/21 201 lb (91.2 kg)      Lab Results  Component Value Date   HGBA1C 6.2 (A) 01/24/2021   HGBA1C 6.8 (H) 09/16/2020   HGBA1C 6.5 05/10/2020   Lab Results  Component Value Date   MICROALBUR 18.6 (H) 01/24/2021   LDLCALC 69 01/24/2021   CREATININE 1.04 01/24/2021    Other  active problems: See review of systems    Allergies as of 05/27/2021       Reactions   Cephalexin Nausea And Vomiting        Medication List        Accurate as of May 27, 2021 10:40 AM. If you have any questions, ask your nurse or doctor.          acetaminophen 500 MG tablet Commonly known as: TYLENOL Take 1,300 mg by mouth at bedtime.   alfuzosin 10 MG 24 hr tablet Commonly known as: UROXATRAL Take 10 mg by mouth at bedtime. Reported on 12/18/2015   amLODipine 10 MG tablet Commonly known as: NORVASC TAKE 1 TABLET BY MOUTH EVERY DAY   atorvastatin 40 MG tablet Commonly known as: LIPITOR Take 1 tablet (40 mg total) by mouth daily.   Cinnamon 500 MG capsule Take 500 mg by mouth 2 (two) times daily.   cyanocobalamin 1000 MCG/ML injection Commonly known as: (VITAMIN B-12) Inject into the muscle.   Eliquis 5 MG Tabs tablet Generic drug: apixaban Take 1 tablet (5 mg total) by mouth 2 (two) times daily.   finasteride 5 MG tablet Commonly known as: PROSCAR Take 5 mg by mouth every morning. Reported on 12/18/2015   FREESTYLE LITE test strip Generic drug: glucose blood  USE TO TEST TWICE A DAY   HumuLIN 70/30 KwikPen (70-30) 100 UNIT/ML KwikPen Generic drug: insulin isophane & regular human Inject 20 Units into the skin in the morning and at bedtime.   lisinopril 20 MG tablet Commonly known as: ZESTRIL Take 1 tablet (20 mg total) by mouth daily.   metFORMIN 750 MG 24 hr tablet Commonly known as: GLUCOPHAGE-XR Take 2 tablets (1,500 mg total) by mouth daily with breakfast.   metoprolol succinate 25 MG 24 hr tablet Commonly known as: TOPROL-XL TAKE 1 TABLET BY MOUTH EVERY DAY   OMEGA-3 FISH OIL PO Take 1 capsule by mouth daily.   Pen Needles 31G X 5 MM Misc 1 each by Does not apply route 2 (two) times daily.   prednisoLONE acetate 1 % ophthalmic suspension Commonly known as: PRED FORTE PLACE 1 DROP INTO THE RIGHT EYE DAILY.   traMADol 50 MG  tablet Commonly known as: Ultram Take 1 tablet (50 mg total) by mouth every 6 (six) hours as needed for moderate pain. Post-operatively        Allergies:  Allergies  Allergen Reactions   Cephalexin Nausea And Vomiting    Past Medical History:  Diagnosis Date   AAA (abdominal aortic aneurysm) ([place]) followed by dr Bridgett Larsson   last duplex 06/28/2018 4.6 cm,  asymptomatic   Anticoagulant long-term use    eliquis   Benign localized prostatic hyperplasia with lower urinary tract symptoms (LUTS)    CAD (coronary artery disease) cardiologist-  dr Bronson Ing   remote CABG in 1997; prior PCI to the LAD i n1990; Nuclear study in February of 2012 normal with an EF of 60%    DDD (degenerative disc disease), cervical    Full dentures    History of radiation therapy 11/24/12-01/18/13   Prostate 78Gy/65fx   HOH (hard of hearing)    both   Hyperlipidemia    Hyperlipidemia    Hypertension    Permanent atrial fibrillation ([place]) 09/2012   followed by dr Bronson Ing   Phimosis    severe   Proliferative diabetic retinopathy ([place])    both eyes   Prostate cancer [facility]) urologist-- dr Tresa Moore   dx 09-02-2012-- Stage T2a, Gleason 3+4=7, PSA 8.12, vol 103 cc--- completed external beam radiation 02/ 2014;  recurrent 2015 , started hormone therapy   S/P CABG x 3 1997   Strains to urinate    Type 2 diabetes mellitus treated with insulin [facility])    endocrinologist-- dr Dwyane Dee    Past Surgical History:  Procedure Laterality Date   CARDIAC CATHETERIZATION  01-18-2004   dr Doreatha Lew   patent grafts, ef 55%, minimal anterior hypokinesis,  severe total occlusion of LCFx and LAD,  severe disease of D2 and segmental RCA   CARDIOVASCULAR STRESS TEST  01/07/2011   normal nuclear study w/ no ischemia/  normal LV function and wall motion,  ef 60%   CARPAL TUNNEL RELEASE Right    CATARACT EXTRACTION W/ INTRAOCULAR LENS  IMPLANT, BILATERAL  left 2006;  right 2010   CIRCUMCISION N/A 07/28/2018   Procedure: CIRCUMCISION ADULT;   Surgeon: Alexis Frock, MD;  [facility]  Service: Urology;  Laterality: N/A;   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   CORONARY ANGIOPLASTY  1990   LAD   CORONARY ARTERY BYPASS GRAFT  11-1995   shows distal LAD disease, without recurrent symptoms of angina with questionable mild apical ischemia but with normal EF    ESOPHAGOGASTRODUODENOSCOPY (EGD) WITH ESOPHAGEAL DILATION  05/2012  KNEE ARTHROSCOPY Left    TRANSTHORACIC ECHOCARDIOGRAM  10/10/2012   ef 55%, akinesis of the inferolateral wall,  grade 2 diastolic dysfunction/  trivial AR/  mild MR and TR/  mild LAE/  mild RVSF reduced and mild dilated RV    Family History  Problem Relation Age of Onset   Stroke Father    Heart attack Mother    Heart disease Mother    Cancer Sister        lung   Cancer Brother        prostate   Diabetes Brother    Stroke Brother    Stroke Brother    Heart disease Brother    Heart disease Brother    Heart attack Sister    Heart disease Sister    Heart attack Sister    Cancer Sister        breast   Heart disease Sister    Heart attack Sister    Heart disease Sister    Heart disease Sister     Social History:  reports that he quit smoking about 41 years ago. His smoking use included cigarettes. He quit smokeless tobacco use about 8 years ago.  His smokeless tobacco use included chew. He reports that he does not drink alcohol and does not use drugs.  ROS   HYPERTENSION:   Has been present for several years.    He has been treated with lisinopril 20 mg, amlodipine 10mg , and metoprolol 25 mg His amlodipine has been prescribed by cardiologist  He has a blood pressure meter at home and not clear what his readings have been lately   BP Readings from Last 3 Encounters:  05/27/21 130/64  03/25/21 (!) 163/84  01/24/21 (!) 148/64          HYPERLIPIDEMIA: Hypercholesterolemia is well controlled with 40 mg Lipitor prescribed by his cardiologist Labs as follows:   Lab  Results  Component Value Date   CHOL 140 01/24/2021   HDL 56.80 01/24/2021   [place] 69 01/24/2021   TRIG 71.0 01/24/2021   CHOLHDL 2 01/24/2021    Diabetic foot exam done in 7/22 showed normal monofilament sensation and absent pulses on the left as before  He is being followed by vascular surgeon for aortic aneurysm  He is being followed regularly by [facility], has corneal dystrophy and macular edema as well as retinopathy  He refuses to take the Covid vaccine     Examination:   BP 130/64   Pulse 82   Ht 6' (1.829 m)   Wt 199 lb 3.2 oz (90.4 kg)   SpO2 99%   BMI 27.02 kg/m   Body mass index is 27.02 kg/m.   Diabetic Foot Exam - Simple   Simple Foot Form Diabetic Foot exam was performed with the following findings: Yes   Visual Inspection No deformities, no ulcerations, no other skin breakdown bilaterally: Yes See comments: Yes Sensation Testing Intact to touch and monofilament testing bilaterally: Yes Pulse Check See comments: Yes Comments Onychomycosis present of most of the toenails especially first toe. Pulses absent on the left      Assesment/PLAN:  DIABETES, Type II on insulin:  See history of present illness for  discussion of current diabetes management and problems identified  His A1c is  higher at 7%  He has been on 20 units premixed insulin twice a day using the insulin pens Also on metformin ER 1500 mg  He is likely having periodic hyperglycemia after meals  which he does not monitor This is usually related to higher fat or higher carbohydrate meals not covered adequately by his premixed insulin  Unable to verify his blood sugars at home since he did not bring his monitor Overall blood sugars are still adequate considering his age and difficulty with consistent compliance  He will continue taking 20 units of 70/30 insulin before breakfast and dinner However may consider switching to Humalog mix although this is likely to be  more expensive for him Reminded him to check blood sugars regularly after meals He will need to avoid eating high fat meats such as sausage and a daily basis and also high carbohydrate foods and drinks Encouraged him to start walking when able to especially in the cooler part of the day   HYPERTENSION: Blood pressure is better controlled on today's measurement Again encouraged him to check regularly at home  Foot care: Encouraged him to see a podiatrist to have his toenails trimmed because of his onychomycosis Also needs to avoid walking barefoot anytime  Follow-up in 4 months   There are no Patient Instructions on file for this visit.   Elayne Snare 05/27/2021, 10:40 AM

## 2021-05-28 ENCOUNTER — Telehealth: Payer: Self-pay

## 2021-05-28 NOTE — Telephone Encounter (Signed)
Received [facility] from [facility] that patient needs to create an account with them in order for medication to be filled. Called pt and left vm. This is the pharmacy patients daughter asked for rx to be sent to.They were having issues with local pharmacy.

## 2021-06-02 DIAGNOSIS — D649 Anemia, unspecified: Secondary | ICD-10-CM | POA: Diagnosis not present

## 2021-06-02 DIAGNOSIS — E538 Deficiency of other specified B group vitamins: Secondary | ICD-10-CM | POA: Diagnosis not present

## 2021-06-16 ENCOUNTER — Telehealth: Payer: Self-pay | Admitting: Student

## 2021-06-16 MED ORDER — AMLODIPINE BESYLATE 10 MG PO TABS: ORAL_TABLET | ORAL | 3 refills | Status: DC

## 2021-06-16 NOTE — Telephone Encounter (Signed)
Medication refill request approved for Amlodipine 10 mg tablets and sent to [facility] [address].

## 2021-06-16 NOTE — Telephone Encounter (Signed)
New message      *STAT* If patient is at the pharmacy, call can be transferred to refill team.   1. Which medications need to be refilled? (please list name of each medication and dose if known) amLODipine (NORVASC) 10 MG tablet  2. Which pharmacy/location (including street and city if local pharmacy) is medication to be sent to? [facility] [address] [facility] [employer]   3. Do they need a 30 day or 90 day supply? [place]

## 2021-07-02 ENCOUNTER — Other Ambulatory Visit: Payer: Self-pay | Admitting: Endocrinology

## 2021-07-02 ENCOUNTER — Other Ambulatory Visit: Payer: Self-pay | Admitting: *Deleted

## 2021-07-02 MED ORDER — LISINOPRIL 20 MG PO TABS: 20.0000 mg | ORAL_TABLET | Freq: Every day | ORAL | 3 refills | Status: DC

## 2021-07-03 DIAGNOSIS — E538 Deficiency of other specified B group vitamins: Secondary | ICD-10-CM | POA: Diagnosis not present

## 2021-07-09 NOTE — Progress Notes (Addendum)
Cardiology Office Note    Date:  07/10/2021   ID:  Steve Bailey, DOB 1934/05/17, MRN [id number]  PCP:  Pieter Partridge, PA  Cardiologist: Previously Dr. Bronson Ing --> Needs to switch to new MD  Chief Complaint  Patient presents with   Follow-up    6 month visit    History of Present Illness:    Steve Bailey is a 87 y.o. male with past medical history of CAD (s/p CABG in 1997, patent grafts by cath in 2005, low-risk NST in 2012), permanent atrial fibrillation, HTN, HLD, IDDM and AAA (at 5.1 cm in 08/2020) who presents to the office today for 54-monthfollow-up.  He was last examined by myself in 12/2020 and reported remaining active at baseline and denied any recent anginal symptoms. The option of repeat stress testing was reviewed with the patient given that it had been 10 years since his last ischemic evaluation and he was in agreement to pursue a [employer] This showed no evidence of ischemia or infarction and was overall a low-risk study.  He did follow-up with Vascular Surgery in 03/2021 and repeat AAA duplex showed this had grown from 5.1 cm to 5.28 cm with repeat imaging recommended in 6 months.  In talking with the patient and his daughter today, he reports overall doing well since his last office visit. He remains active at baseline and enjoys working outside. 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 He did experience abrasions to his knees but has been able to walk and reports his pain continues to improve. He denies any recent chest pain or palpitations. Experiences dyspnea if walking up an incline but no recent change in symptoms. No recent orthopnea, PND or pitting edema.  His daughter is concerned that his Metformin could be contributing to his B12 deficiency and plans to discuss this further with Endocrinology at his next visit.   Past Medical History:  Diagnosis Date   AAA  (abdominal aortic aneurysm) ([facility] followed by dr cBridgett Larsson  last duplex 06/28/2018 4.6 cm,  asymptomatic   Anticoagulant long-term use    eliquis   Benign localized prostatic hyperplasia with lower urinary tract symptoms (LUTS)    CAD (coronary artery disease) cardiologist-  dr kBronson Ing  remote CABG in 1997; prior PCI to the LAD i n1990; Nuclear study in February of 2012 normal with an EF of 60%    DDD (degenerative disc disease), cervical    Full dentures    History of radiation therapy 11/24/12-01/18/13   Prostate 78Gy/443f  HOH (hard of hearing)    both   Hyperlipidemia    Hyperlipidemia    Hypertension    Permanent atrial fibrillation ([facility]2013   followed by dr koBronson Ing Phimosis    severe   Proliferative diabetic retinopathy ([facility]   both eyes   [facility]-- dr maTresa Moore dx 09-02-2012-- Stage T2a, Gleason 3+4=7, PSA 8.12, vol 103 cc--- completed external beam radiation 02/ 2014;  recurrent 2015 , started hormone therapy   S/P CABG x 3 1997   Strains to urinate    [facility]   endocrinologist-- dr kuDwyane Dee  Past Surgical History:  Procedure Laterality Date   CARDIAC CATHETERIZATION  01-18-2004   dr teDoreatha Lew patent grafts, ef 55%, minimal anterior hypokinesis,  severe total occlusion  of LCFx and LAD,  severe disease of D2 and segmental RCA   CARDIOVASCULAR STRESS TEST  01/07/2011   normal nuclear study w/ no ischemia/  normal LV function and wall motion,  ef 60%   CARPAL TUNNEL RELEASE Right    CATARACT EXTRACTION W/ INTRAOCULAR LENS  IMPLANT, BILATERAL  left 2006;  right 2010   CIRCUMCISION N/A 07/28/2018   Procedure: CIRCUMCISION ADULT;  Surgeon: Alexis Frock, MD;  Location: [facility];  Service: Urology;  Laterality: N/A;   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   CORONARY ANGIOPLASTY  1990   LAD   CORONARY ARTERY BYPASS GRAFT  11-1995   shows distal LAD disease, without recurrent symptoms of  angina with questionable mild apical ischemia but with normal EF    ESOPHAGOGASTRODUODENOSCOPY (EGD) WITH ESOPHAGEAL DILATION  05/2012   KNEE ARTHROSCOPY Left    TRANSTHORACIC ECHOCARDIOGRAM  10/10/2012   ef 55%, akinesis of the inferolateral wall,  grade 2 diastolic dysfunction/  trivial AR/  mild MR and TR/  mild LAE/  mild RVSF reduced and mild dilated RV    Current Medications: Outpatient Medications Prior to Visit  Medication Sig Dispense Refill   acetaminophen (TYLENOL) 500 MG tablet Take 1,300 mg by mouth at bedtime.     alfuzosin (UROXATRAL) 10 MG 24 hr tablet Take 10 mg by mouth at bedtime. Reported on 12/18/2015     amLODipine (NORVASC) 10 MG tablet TAKE 1 TABLET BY MOUTH EVERY DAY 90 tablet 3   apixaban (ELIQUIS) 5 MG TABS tablet Take 1 tablet (5 mg total) by mouth 2 (two) times daily. 180 tablet 2   atorvastatin (LIPITOR) 40 MG tablet Take 1 tablet (40 mg total) by mouth daily. 90 tablet 3   Cinnamon 500 MG capsule Take 500 mg by mouth 2 (two) times daily.     cyanocobalamin (,VITAMIN B-12,) 1000 MCG/ML injection Inject into the muscle.     finasteride (PROSCAR) 5 MG tablet Take 5 mg by mouth every morning. Reported on 12/18/2015     FREESTYLE LITE test strip USE TO TEST TWICE A DAY 100 strip 1   insulin isophane & regular human (HUMULIN 70/30 KWIKPEN) (70-30) 100 UNIT/ML KwikPen Inject 20 Units into the skin in the morning and at bedtime. 45 mL 2   Insulin Pen Needle (PEN NEEDLES) 31G X 5 MM MISC 1 each by Does not apply route 2 (two) times daily. 180 each 2   lisinopril (ZESTRIL) 20 MG tablet Take 1 tablet (20 mg total) by mouth daily. 90 tablet 3   metFORMIN (GLUCOPHAGE-XR) 750 MG 24 hr tablet TAKE 2 TABLETS BY MOUTH DAILY WITH BREAKFAST. 180 tablet 1   metoprolol succinate (TOPROL-XL) 25 MG 24 hr tablet TAKE 1 TABLET BY MOUTH EVERY DAY 90 tablet 1   Omega-3 Fatty Acids (OMEGA-3 FISH OIL PO) Take 1 capsule by mouth daily.     prednisoLONE acetate (PRED FORTE) 1 % ophthalmic  suspension PLACE 1 DROP INTO THE RIGHT EYE DAILY.     traMADol (ULTRAM) 50 MG tablet Take 1 tablet (50 mg total) by mouth every 6 (six) hours as needed for moderate pain. Post-operatively 15 tablet 0   No facility-administered medications prior to visit.     Allergies:   Cephalexin   Social History   Socioeconomic History   Marital status: Widowed    Spouse name: Not on file   Number of children: Not on file   Years of education: Not on file   Highest education level:  Not on file  Occupational History   Not on file  Tobacco Use   Smoking status: Former    Years: 20.00    Types: Cigarettes    Quit date: 10/14/1979    Years since quitting: 41.7   Smokeless tobacco: Former    Types: Chew    Quit date: 11/23/2012  Vaping Use   Vaping Use: Never used  Substance and Sexual Activity   Alcohol use: No   Drug use: No   Sexual activity: Not on file  Other Topics Concern   Not on file  Social History Narrative   Not on file   Social Determinants of Health   Financial Resource Strain: Not on file  Food Insecurity: Not on file  Transportation Needs: Not on file  Physical Activity: Not on file  Stress: Not on file  Social Connections: Not on file     Family History:  The patient's family history includes Cancer in his brother, sister, and sister; Diabetes in his brother; Heart attack in his mother, sister, sister, and sister; Heart disease in his brother, brother, mother, sister, sister, sister, and sister; Stroke in his brother, brother, and father.   Review of Systems:    Please see the history of present illness.     All other systems reviewed and are otherwise negative except as noted above.   Physical Exam:    VS:  BP 126/64   Pulse 84   Ht 6' (1.829 m)   Wt 195 lb (88.5 kg)   SpO2 98%   BMI 26.45 kg/m    General: Pleasant elderly male appearing in no acute distress. Head: Normocephalic, atraumatic. Neck: No carotid bruits. JVD not elevated.  Lungs:  Respirations regular and unlabored, without wheezes or rales.  Heart: Irregularly irregular. No S3 or S4.  No murmur, no rubs, or gallops appreciated. Abdomen: Appears non-distended. No obvious abdominal masses. Msk:  Strength and tone appear normal for age. No obvious joint deformities or effusions. Extremities: No clubbing or cyanosis. Trace ankle edema bilaterally.  Distal pedal pulses are 2+ bilaterally. Neuro: Alert and oriented X 3. Moves all extremities spontaneously. No focal deficits noted. Psych:  Responds to questions appropriately with a normal affect. Skin: No rashes or lesions noted  Wt Readings from Last 3 Encounters:  07/10/21 195 lb (88.5 kg)  05/27/21 199 lb 3.2 oz (90.4 kg)  03/25/21 198 lb (89.8 kg)     Studies/Labs Reviewed:   EKG:  EKG is not ordered today.   Recent Labs: 09/16/2020: TSH 4.46 01/24/2021: ALT 14; BUN 19; Creatinine, Ser 1.04; Hemoglobin 11.6; Platelets 174.0; Potassium 4.6; Sodium 139   Lipid Panel    Component Value Date/Time   CHOL 140 01/24/2021 1204   TRIG 71.0 01/24/2021 1204   HDL 56.80 01/24/2021 1204   CHOLHDL 2 01/24/2021 1204   VLDL 14.2 01/24/2021 1204   LDLCALC 69 01/24/2021 1204    Additional studies/ records that were reviewed today include:   NST: 12/2020 Blood pressure demonstrated a normal response to exercise. There was no ST segment deviation noted during stress.   Normal resting and stress perfusion. No ischemia or infarction EF 59% underlying rhythm afib   AAA [place]: 03/2021 Aorta Findings:  +--------+-------+----------+----------+--------+--------+--------+  LocationAP (cm)Trans (cm)PSV (cm/s)WaveformThrombusComments  +--------+-------+----------+----------+--------+--------+--------+  Proximal1.93   2.00      86                                  +--------+-------+----------+----------+--------+--------+--------+  Mid     3.15   3.17      62                                   +--------+-------+----------+----------+--------+--------+--------+  Distal  5.28   4.93      22                                  +--------+-------+----------+----------+--------+--------+--------+   Summary:  Abdominal Aorta: There is evidence of abnormal dilatation of the distal  Abdominal aorta. The largest aortic diameter has increased compared to  prior exam. Previous diameter measurement was 5.01 x 4.94 cm obtained on  09/10/20.   Assessment:    1. Coronary artery disease of bypass graft of native heart with stable angina pectoris ([place])   2. Permanent atrial fibrillation ([place])   3. Essential hypertension   4. Hyperlipidemia LDL goal <70   5. AAA (abdominal aortic aneurysm) without rupture ([place])      Plan:   In order of problems listed above:  1. CAD - He is s/p CABG in 1997 with patent grafts by cath in 2005 and low-risk NST in 2012 and 12/2020. He remains active at baseline for his age and denies any recent chest pain. He does have dyspnea when walking up inclines which has been unchanged. - Continue current medication regimen with Atorvastatin 40 mg daily and Toprol-XL 25 mg daily. He is no longer on ASA given the need for anticoagulation.  2. Permanent Atrial Fibrillation - He denies any recent palpitations and his heart rate is well-controlled in the 80's during today's visit. Continue Toprol-XL 25 mg daily for rate-control. - He denies any evidence of active bleeding and remains on Eliquis 5 mg twice daily for anticoagulation which is the appropriate dose at this time given his age, weight and kidney function. Hemoglobin was at 11.6 when checked in 05/2021 with platelets at 170 K.  3. HTN - His blood pressure is well-controlled at 126/64 during today's visit. Continue current medication regimen with Amlodipine 10 mg daily, Lisinopril 20 mg daily and Toprol-XL 25 mg daily.  4. HLD - His LDL was at 69 in 01/2021. Continue Atorvastatin 40 mg daily.  5. AAA -  Followed by Vascular Surgery. Recent AAA duplex in 03/2021 showed this has grown from 5.1 cm to 5.28 cm with repeat imaging recommended in 6 months.   Medication Adjustments/Labs and Tests Ordered: Current medicines are reviewed at length with the patient today.  Concerns regarding medicines are outlined above.  Medication changes, Labs and Tests ordered today are listed in the Patient Instructions below. Patient Instructions  Medication Instructions:  Your physician recommends that you continue on your current medications as directed. Please refer to the Current Medication list given to you today.  *If you need a refill on your cardiac medications before your next appointment, please call your pharmacy*   Lab Work: NONE   If you have labs (blood work) drawn today and your tests are completely normal, you will receive your results only by: [place] (if you have MyChart) OR A paper copy in the mail If you have any lab test that is abnormal or we need to change your treatment, we will call you to review the results.   Testing/Procedures: NONE    Follow-Up: At [facility], you and your health needs  are our priority.  As part of our continuing mission to provide you with exceptional heart care, we have created designated Provider Care Teams.  These Care Teams include your primary Cardiologist (physician) and Advanced Practice Providers (APPs -  Physician Assistants and Nurse Practitioners) who all work together to provide you with the care you need, when you need it.  We recommend signing up for the patient portal called "MyChart".  Sign up information is provided on this After Visit Summary.  MyChart is used to connect with patients for Virtual Visits (Telemedicine).  Patients are able to view lab/test results, encounter notes, upcoming appointments, etc.  Non-urgent messages can be sent to your provider as well.   To learn more about what you can do with MyChart, go to  [email or web address].    Your next appointment:   6 month(s)  The format for your next appointment:   In Person  Provider:   Bernerd Pho, Hershal Coria or MD from [facility]    Other Instructions Thank you for choosing [facility]!     Signed, Erma Heritage, PA-C  07/10/2021 3:25 PM    [place] [address] [place], [place] [address] Phone: [phone] Fax: [phone]

## 2021-07-10 ENCOUNTER — Ambulatory Visit (INDEPENDENT_AMBULATORY_CARE_PROVIDER_SITE_OTHER): Payer: Medicare Other | Admitting: Student

## 2021-07-10 ENCOUNTER — Other Ambulatory Visit: Payer: Self-pay

## 2021-07-10 ENCOUNTER — Encounter: Payer: Self-pay | Admitting: Student

## 2021-07-10 VITALS — BP 126/64 | HR 84 | Ht 72.0 in | Wt 195.0 lb

## 2021-07-10 DIAGNOSIS — E785 Hyperlipidemia, unspecified: Secondary | ICD-10-CM

## 2021-07-10 DIAGNOSIS — I25708 Atherosclerosis of coronary artery bypass graft(s), unspecified, with other forms of angina pectoris: Secondary | ICD-10-CM | POA: Diagnosis not present

## 2021-07-10 DIAGNOSIS — I10 Essential (primary) hypertension: Secondary | ICD-10-CM

## 2021-07-10 DIAGNOSIS — I714 Abdominal aortic aneurysm, without rupture, unspecified: Secondary | ICD-10-CM

## 2021-07-10 DIAGNOSIS — I4821 Permanent atrial fibrillation: Secondary | ICD-10-CM | POA: Diagnosis not present

## 2021-07-10 NOTE — Patient Instructions (Signed)
Medication Instructions:  Your physician recommends that you continue on your current medications as directed. Please refer to the Current Medication list given to you today.  *If you need a refill on your cardiac medications before your next appointment, please call your pharmacy*   Lab Work: NONE   If you have labs (blood work) drawn today and your tests are completely normal, you will receive your results only by: [place] (if you have MyChart) OR A paper copy in the mail If you have any lab test that is abnormal or we need to change your treatment, we will call you to review the results.   Testing/Procedures: NONE    Follow-Up: At [facility], you and your health needs are our priority.  As part of our continuing mission to provide you with exceptional heart care, we have created designated Provider Care Teams.  These Care Teams include your primary Cardiologist (physician) and Advanced Practice Providers (APPs -  Physician Assistants and Nurse Practitioners) who all work together to provide you with the care you need, when you need it.  We recommend signing up for the patient portal called "MyChart".  Sign up information is provided on this After Visit Summary.  MyChart is used to connect with patients for Virtual Visits (Telemedicine).  Patients are able to view lab/test results, encounter notes, upcoming appointments, etc.  Non-urgent messages can be sent to your provider as well.   To learn more about what you can do with MyChart, go to [email or web address].    Your next appointment:   6 month(s)  The format for your next appointment:   In Person  Provider:   Bernerd Pho, Hershal Coria or MD from [facility]    Other Instructions Thank you for choosing [place]!

## 2021-07-21 DIAGNOSIS — Z0000 Encounter for general adult medical examination without abnormal findings: Secondary | ICD-10-CM | POA: Diagnosis not present

## 2021-07-29 DIAGNOSIS — Z947 Corneal transplant status: Secondary | ICD-10-CM | POA: Diagnosis not present

## 2021-07-29 DIAGNOSIS — Z961 Presence of intraocular lens: Secondary | ICD-10-CM | POA: Diagnosis not present

## 2021-07-29 DIAGNOSIS — Z7984 Long term (current) use of oral hypoglycemic drugs: Secondary | ICD-10-CM | POA: Diagnosis not present

## 2021-07-29 DIAGNOSIS — H18513 Endothelial corneal dystrophy, bilateral: Secondary | ICD-10-CM | POA: Diagnosis not present

## 2021-07-29 DIAGNOSIS — H35371 Puckering of macula, right eye: Secondary | ICD-10-CM | POA: Diagnosis not present

## 2021-07-29 DIAGNOSIS — H353221 Exudative age-related macular degeneration, left eye, with active choroidal neovascularization: Secondary | ICD-10-CM | POA: Diagnosis not present

## 2021-07-29 DIAGNOSIS — E113513 Type 2 diabetes mellitus with proliferative diabetic retinopathy with macular edema, bilateral: Secondary | ICD-10-CM | POA: Diagnosis not present

## 2021-09-05 DIAGNOSIS — Z23 Encounter for immunization: Secondary | ICD-10-CM | POA: Diagnosis not present

## 2021-09-08 ENCOUNTER — Other Ambulatory Visit: Payer: Self-pay | Admitting: Endocrinology

## 2021-09-16 DIAGNOSIS — C61 Malignant neoplasm of prostate: Secondary | ICD-10-CM | POA: Diagnosis not present

## 2021-09-23 DIAGNOSIS — N471 Phimosis: Secondary | ICD-10-CM | POA: Diagnosis not present

## 2021-09-23 DIAGNOSIS — C61 Malignant neoplasm of prostate: Secondary | ICD-10-CM | POA: Diagnosis not present

## 2021-09-23 DIAGNOSIS — R3912 Poor urinary stream: Secondary | ICD-10-CM | POA: Diagnosis not present

## 2021-09-30 ENCOUNTER — Other Ambulatory Visit: Payer: Self-pay

## 2021-09-30 ENCOUNTER — Encounter: Payer: Self-pay | Admitting: Endocrinology

## 2021-09-30 ENCOUNTER — Ambulatory Visit (INDEPENDENT_AMBULATORY_CARE_PROVIDER_SITE_OTHER): Payer: Medicare Other | Admitting: Endocrinology

## 2021-09-30 ENCOUNTER — Other Ambulatory Visit (INDEPENDENT_AMBULATORY_CARE_PROVIDER_SITE_OTHER): Payer: Medicare Other

## 2021-09-30 VITALS — BP 124/82 | HR 63 | Ht 72.0 in | Wt 204.6 lb

## 2021-09-30 DIAGNOSIS — I25708 Atherosclerosis of coronary artery bypass graft(s), unspecified, with other forms of angina pectoris: Secondary | ICD-10-CM | POA: Diagnosis not present

## 2021-09-30 DIAGNOSIS — E7800 Pure hypercholesterolemia, unspecified: Secondary | ICD-10-CM

## 2021-09-30 DIAGNOSIS — I10 Essential (primary) hypertension: Secondary | ICD-10-CM

## 2021-09-30 DIAGNOSIS — Z794 Long term (current) use of insulin: Secondary | ICD-10-CM | POA: Diagnosis not present

## 2021-09-30 DIAGNOSIS — E1165 Type 2 diabetes mellitus with hyperglycemia: Secondary | ICD-10-CM

## 2021-09-30 LAB — COMPREHENSIVE METABOLIC PANEL
ALT: 24 U/L (ref 0–53)
AST: 25 U/L (ref 0–37)
Albumin: 4 g/dL (ref 3.5–5.2)
Alkaline Phosphatase: 75 U/L (ref 39–117)
BUN: 17 mg/dL (ref 6–23)
CO2: 26 mEq/L (ref 19–32)
Calcium: 9.1 mg/dL (ref 8.4–10.5)
Chloride: 105 mEq/L (ref 96–112)
Creatinine, Ser: 1 mg/dL (ref 0.40–1.50)
GFR: 68.67 mL/min (ref >60.00)
Glucose, Bld: 93 mg/dL (ref 70–99)
Potassium: 3.9 mEq/L (ref 3.5–5.1)
Sodium: 139 mEq/L (ref 135–145)
Total Bilirubin: 0.8 mg/dL (ref 0.2–1.2)
Total Protein: 6.6 g/dL (ref 6.0–8.3)

## 2021-09-30 LAB — POCT GLYCOSYLATED HEMOGLOBIN (HGB A1C): Hemoglobin A1C: 6.7 % — AB (ref 4.0–5.6)

## 2021-09-30 LAB — POCT GLUCOSE (DEVICE FOR HOME USE): Glucose Fasting, POC: 113 mg/dL — AB (ref 70–99)

## 2021-09-30 LAB — LIPID PANEL
Cholesterol: 132 mg/dL (ref 0–200)
HDL: 56.4 mg/dL (ref >39.00)
LDL Cholesterol: 66 mg/dL (ref 0–99)
NonHDL: 75.31
Total CHOL/HDL Ratio: 2
Triglycerides: 47 mg/dL (ref 0.0–149.0)
VLDL: 9.4 mg/dL (ref 0.0–40.0)

## 2021-09-30 LAB — TSH: TSH: 4.4 u[IU]/mL (ref 0.35–5.50)

## 2021-09-30 NOTE — Progress Notes (Signed)
Patient ID: Steve Bailey, male   DOB: 02-Aug-1934, 87 y.o.   MRN: [id number]    Reason for Appointment:  follow-up of various problems  History of Present Illness    Diagnosis: Type 2 DIABETES MELITUS, date of diagnosis: 1995              History:     Insulin regimen: Humulin 70/30 KwikPen,  20 units  a.c. before breakfast and dinner  Oral hypoglycemic drugs: Metformin ER 750 mg, 2 tabs daily   His A1c is better at 6.7  Current management, blood sugar patterns and problems: He did not bring his blood sugar monitor again He says that he checks his blood sugar only occasionally when he thinks about it  Not clear if freestyle strips are expired  Blood sugars by history are usually 110-130 in the mornings and usually not over 160 later in the day if he checks them  He is usually trying to take his insulin before eating especially in the morning but today took it at least 45 minutes before eating  Does not report any hypoglycemic symptoms  Recently has been generally more active outside working although his weight has gone up 5 pounds since his last visit in July  Glucose is 113 after eating half a biscuit for breakfast  Glucometer: Freestyle    Carbohydrate intake: at meals: Mostly with vegetables, also getting some lean meats like Chicken and pork Egg, bacon, grits or biscuits in am    Weight history:  Wt Readings from Last 3 Encounters:  09/30/21 204 lb 9.6 oz (92.8 kg)  07/10/21 195 lb (88.5 kg)  05/27/21 199 lb 3.2 oz (90.4 kg)      Lab Results  Component Value Date   HGBA1C 6.7 (A) 09/30/2021   HGBA1C 7.0 (A) 05/27/2021   HGBA1C 6.2 (A) 01/24/2021   Lab Results  Component Value Date   MICROALBUR 18.6 (H) 01/24/2021   LDLCALC 69 01/24/2021   CREATININE 1.04 01/24/2021    Other active problems: See review of systems    Allergies as of 09/30/2021       Reactions   Cephalexin Nausea And Vomiting        Medication List        Accurate as  of September 30, 2021 10:12 AM. If you have any questions, ask your nurse or doctor.          acetaminophen 500 MG tablet Commonly known as: TYLENOL Take 1,300 mg by mouth at bedtime.   alfuzosin 10 MG 24 hr tablet Commonly known as: UROXATRAL Take 10 mg by mouth at bedtime. Reported on 12/18/2015   amLODipine 10 MG tablet Commonly known as: NORVASC TAKE 1 TABLET BY MOUTH EVERY DAY   atorvastatin 40 MG tablet Commonly known as: LIPITOR Take 1 tablet (40 mg total) by mouth daily.   Cinnamon 500 MG capsule Take 500 mg by mouth 2 (two) times daily.   cyanocobalamin 1000 MCG/ML injection Commonly known as: (VITAMIN B-12) Inject into the muscle.   Eliquis 5 MG Tabs tablet Generic drug: apixaban Take 1 tablet (5 mg total) by mouth 2 (two) times daily.   finasteride 5 MG tablet Commonly known as: PROSCAR Take 5 mg by mouth every morning. Reported on 12/18/2015   FREESTYLE LITE test strip Generic drug: glucose blood USE TO TEST TWICE A DAY   HumuLIN 70/30 KwikPen (70-30) 100 UNIT/ML KwikPen Generic drug: insulin isophane & regular human KwikPen Inject 20 Units into the  skin in the morning and at bedtime.   lisinopril 20 MG tablet Commonly known as: ZESTRIL Take 1 tablet (20 mg total) by mouth daily.   metFORMIN 750 MG 24 hr tablet Commonly known as: GLUCOPHAGE-XR TAKE 2 TABLETS BY MOUTH DAILY WITH BREAKFAST.   metoprolol succinate 25 MG 24 hr tablet Commonly known as: TOPROL-XL TAKE 1 TABLET BY MOUTH EVERY DAY   OMEGA-3 FISH OIL PO Take 1 capsule by mouth daily.   Pen Needles 31G X 5 MM Misc 1 each by Does not apply route 2 (two) times daily.   prednisoLONE acetate 1 % ophthalmic suspension Commonly known as: PRED FORTE PLACE 1 DROP INTO THE RIGHT EYE DAILY.   traMADol 50 MG tablet Commonly known as: Ultram Take 1 tablet (50 mg total) by mouth every 6 (six) hours as needed for moderate pain. Post-operatively        Allergies:  Allergies  Allergen  Reactions   Cephalexin Nausea And Vomiting    Past Medical History:  Diagnosis Date   AAA (abdominal aortic aneurysm) followed by dr Bridgett Larsson   last duplex 06/28/2018 4.6 cm,  asymptomatic   Anticoagulant long-term use    eliquis   Benign localized prostatic hyperplasia with lower urinary tract symptoms (LUTS)    CAD (coronary artery disease) cardiologist-  dr Bronson Ing   remote CABG in 1997; prior PCI to the LAD i n1990; Nuclear study in February of 2012 normal with an EF of 60%    DDD (degenerative disc disease), cervical    Full dentures    History of radiation therapy 11/24/12-01/18/13   Prostate 78Gy/31fx   HOH (hard of hearing)    both   Hyperlipidemia    Hyperlipidemia    Hypertension    Permanent atrial fibrillation ([place]) 09/2012   followed by dr Bronson Ing   Phimosis    severe   Proliferative diabetic retinopathy ([place])    both eyes   Prostate cancer [facility]) urologist-- dr Tresa Moore   dx 09-02-2012-- Stage T2a, Gleason 3+4=7, PSA 8.12, vol 103 cc--- completed external beam radiation 02/ 2014;  recurrent 2015 , started hormone therapy   S/P CABG x 3 1997   Strains to urinate    Type 2 diabetes mellitus treated with insulin [facility])    endocrinologist-- dr Dwyane Dee    Past Surgical History:  Procedure Laterality Date   CARDIAC CATHETERIZATION  01-18-2004   dr Doreatha Lew   patent grafts, ef 55%, minimal anterior hypokinesis,  severe total occlusion of LCFx and LAD,  severe disease of D2 and segmental RCA   CARDIOVASCULAR STRESS TEST  01/07/2011   normal nuclear study w/ no ischemia/  normal LV function and wall motion,  ef 60%   CARPAL TUNNEL RELEASE Right    CATARACT EXTRACTION W/ INTRAOCULAR LENS  IMPLANT, BILATERAL  left 2006;  right 2010   CIRCUMCISION N/A 07/28/2018   Procedure: CIRCUMCISION ADULT;  Surgeon: Alexis Frock, MD;  Location: [facility];  Service: Urology;  Laterality: N/A;   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   CORONARY ANGIOPLASTY  1990   LAD    CORONARY ARTERY BYPASS GRAFT  11-1995   shows distal LAD disease, without recurrent symptoms of angina with questionable mild apical ischemia but with normal EF    ESOPHAGOGASTRODUODENOSCOPY (EGD) WITH ESOPHAGEAL DILATION  05/2012   KNEE ARTHROSCOPY Left    TRANSTHORACIC ECHOCARDIOGRAM  10/10/2012   ef 55%, akinesis of the inferolateral wall,  grade 2 diastolic dysfunction/  trivial AR/  mild MR and  TR/  mild LAE/  mild RVSF reduced and mild dilated RV    Family History  Problem Relation Age of Onset   Stroke Father    Heart attack Mother    Heart disease Mother    Cancer Sister        lung   Cancer Brother        prostate   Diabetes Brother    Stroke Brother    Stroke Brother    Heart disease Brother    Heart disease Brother    Heart attack Sister    Heart disease Sister    Heart attack Sister    Cancer Sister        breast   Heart disease Sister    Heart attack Sister    Heart disease Sister    Heart disease Sister     Social History:  reports that he quit smoking about 41 years ago. His smoking use included cigarettes. He quit smokeless tobacco use about 8 years ago.  His smokeless tobacco use included chew. He reports that he does not drink alcohol and does not use drugs.  ROS   HYPERTENSION:   Has been present for several years.    He has been treated with lisinopril 20 mg, amlodipine 10mg , and metoprolol 25 mg His amlodipine has been prescribed by cardiologist  He has used his blood pressure meter at home periodically and recent reading was: 132/70   BP Readings from Last 3 Encounters:  09/30/21 124/82  07/10/21 126/64  05/27/21 130/64          HYPERLIPIDEMIA: Hypercholesterolemia is well controlled with 40 mg Lipitor prescribed by his cardiologist Labs as follows:   Lab Results  Component Value Date   CHOL 140 01/24/2021   HDL 56.80 01/24/2021   [place]man 69 01/24/2021   TRIG 71.0 01/24/2021   CHOLHDL 2 01/24/2021    Diabetic foot exam done in 7/22  showed normal monofilament sensation and absent pulses on the left as before He says he does his nail trimming himself even though he has onychomycosis  He is being followed by vascular surgeon for aortic aneurysm  He is being followed regularly by [facility] ophthalmology, has corneal dystrophy and macular edema as well as retinopathy  He refuses to take the Covid vaccine     Examination:   BP 124/82   Pulse 63   Ht 6' (1.829 m)   Wt 204 lb 9.6 oz (92.8 kg)   SpO2 99%   BMI 27.75 kg/m   Body mass index is 27.75 kg/m.     Assesment/PLAN:  DIABETES, Type II on insulin:  See history of present illness for  discussion of current diabetes management and problems identified  His A1c is   excellent at 6.7  He has been long-term on 20 units premixed insulin twice a day using the insulin pens Also on metformin ER 1500 mg  Considering his age and comorbid conditions as well as lack of hypoglycemia is doing very well with stable diabetes and no new complications  He will continue his regimen unchanged However reminded him to check his blood sugars at least every other day and some after meals He can take his insulin before meals when he starts preparing his food and not more than 30 minutes before his mealtime   HYPERTENSION: Blood pressure is excellent and he also monitors at home, he can continue same regimen Chemistries will be checked today  Lipids: Will need follow-up labs, not  done by PCP recently  Follow-up in 4 months   There are no Patient Instructions on file for this visit.   Elayne Snare 09/30/2021, 10:12 AM

## 2021-10-07 ENCOUNTER — Ambulatory Visit (HOSPITAL_COMMUNITY)
Admission: RE | Admit: 2021-10-07 | Discharge: 2021-10-07 | Disposition: A | Discharge status: Home / Self Care | Payer: Medicare Other | Source: Ambulatory Visit | Attending: Vascular Surgery | Admitting: Vascular Surgery

## 2021-10-07 ENCOUNTER — Encounter: Payer: Self-pay | Admitting: Vascular Surgery

## 2021-10-07 ENCOUNTER — Other Ambulatory Visit: Payer: Self-pay

## 2021-10-07 ENCOUNTER — Ambulatory Visit (INDEPENDENT_AMBULATORY_CARE_PROVIDER_SITE_OTHER): Payer: Medicare Other | Admitting: Vascular Surgery

## 2021-10-07 VITALS — BP 160/77 | HR 75 | Temp 98.2°F | Resp 20 | Ht 72.0 in | Wt 204.0 lb

## 2021-10-07 DIAGNOSIS — I7143 Infrarenal abdominal aortic aneurysm, without rupture: Secondary | ICD-10-CM

## 2021-10-07 DIAGNOSIS — I714 Abdominal aortic aneurysm, without rupture, unspecified: Secondary | ICD-10-CM | POA: Diagnosis not present

## 2021-10-07 NOTE — Progress Notes (Signed)
Patient name: Steve Bailey MRN: [id number] DOB: February 26, 1934 Sex: male  REASON FOR VISIT: 6 month follow-up for known AAA   HPI: Steve Bailey is a 87 y.o. male with history of diabetes, hypertension, hyperlipidemia, prostate cancer, A. Fib on eliquis, known abdominal aortic aneurysm, coronary disease status post CABG that presents for another 6 month follow-up for surveillance of AAA.  He reports no changes to his health since last follow-up visit.  No new abdominal or back pain.  Patient has had a known aneurysm since 2017 when this was discovered incidentally on a CT after a fall.  At the time it was about 4.4 cm.  This has been followed by surveillance.  Last duplex 6 months ago showed it was 5.28 cm.  Patient has no new concerns today.   Past Medical History:  Diagnosis Date   AAA (abdominal aortic aneurysm) followed by dr Bridgett Larsson   last duplex 06/28/2018 4.6 cm,  asymptomatic   Anticoagulant long-term use    eliquis   Benign localized prostatic hyperplasia with lower urinary tract symptoms (LUTS)    CAD (coronary artery disease) cardiologist-  dr Bronson Ing   remote CABG in 1997; prior PCI to the LAD i n1990; Nuclear study in February of 2012 normal with an EF of 60%    DDD (degenerative disc disease), cervical    Full dentures    History of radiation therapy 11/24/12-01/18/13   Prostate 78Gy/30fx   HOH (hard of hearing)    both   Hyperlipidemia    Hyperlipidemia    Hypertension    Permanent atrial fibrillation ([place]) 09/2012   followed by dr Bronson Ing   Phimosis    severe   Proliferative diabetic retinopathy ([place])    both eyes   Prostate cancer [facility]) urologist-- dr Tresa Moore   dx 09-02-2012-- Stage T2a, Gleason 3+4=7, PSA 8.12, vol 103 cc--- completed external beam radiation 02/ 2014;  recurrent 2015 , started hormone therapy   S/P CABG x 3 1997   Strains to urinate    Type 2 diabetes mellitus treated with insulin [facility])    endocrinologist-- dr Dwyane Dee    Past Surgical History:   Procedure Laterality Date   CARDIAC CATHETERIZATION  01-18-2004   dr Doreatha Lew   patent grafts, ef 55%, minimal anterior hypokinesis,  severe total occlusion of LCFx and LAD,  severe disease of D2 and segmental RCA   CARDIOVASCULAR STRESS TEST  01/07/2011   normal nuclear study w/ no ischemia/  normal LV function and wall motion,  ef 60%   CARPAL TUNNEL RELEASE Right    CATARACT EXTRACTION W/ INTRAOCULAR LENS  IMPLANT, BILATERAL  left 2006;  right 2010   CIRCUMCISION N/A 07/28/2018   Procedure: CIRCUMCISION ADULT;  Surgeon: Alexis Frock, MD;  Location: [facility];  Service: Urology;  Laterality: N/A;   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   CORONARY ANGIOPLASTY  1990   LAD   CORONARY ARTERY BYPASS GRAFT  11-1995   shows distal LAD disease, without recurrent symptoms of angina with questionable mild apical ischemia but with normal EF    ESOPHAGOGASTRODUODENOSCOPY (EGD) WITH ESOPHAGEAL DILATION  05/2012   KNEE ARTHROSCOPY Left    TRANSTHORACIC ECHOCARDIOGRAM  10/10/2012   ef 55%, akinesis of the inferolateral wall,  grade 2 diastolic dysfunction/  trivial AR/  mild MR and TR/  mild LAE/  mild RVSF reduced and mild dilated RV    Family History  Problem Relation Age of Onset   Stroke Father    Heart attack  Mother    Heart disease Mother    Cancer Sister        lung   Cancer Brother        prostate   Diabetes Brother    Stroke Brother    Stroke Brother    Heart disease Brother    Heart disease Brother    Heart attack Sister    Heart disease Sister    Heart attack Sister    Cancer Sister        breast   Heart disease Sister    Heart attack Sister    Heart disease Sister    Heart disease Sister     SOCIAL HISTORY: Social History   Tobacco Use   Smoking status: Former    Years: 20.00    Types: Cigarettes    Quit date: 10/14/1979    Years since quitting: 42.0   Smokeless tobacco: Former    Types: Chew    Quit date: 11/23/2012  Substance Use Topics    Alcohol use: No    Allergies  Allergen Reactions   Cephalexin Nausea And Vomiting    Current Outpatient Medications  Medication Sig Dispense Refill   acetaminophen (TYLENOL) 500 MG tablet Take 1,300 mg by mouth at bedtime.     alfuzosin (UROXATRAL) 10 MG 24 hr tablet Take 10 mg by mouth at bedtime. Reported on 12/18/2015     amLODipine (NORVASC) 10 MG tablet TAKE 1 TABLET BY MOUTH EVERY DAY 90 tablet 3   apixaban (ELIQUIS) 5 MG TABS tablet Take 1 tablet (5 mg total) by mouth 2 (two) times daily. 180 tablet 2   atorvastatin (LIPITOR) 40 MG tablet Take 1 tablet (40 mg total) by mouth daily. 90 tablet 3   Cinnamon 500 MG capsule Take 500 mg by mouth 2 (two) times daily.     cyanocobalamin (,VITAMIN B-12,) 1000 MCG/ML injection Inject into the muscle.     finasteride (PROSCAR) 5 MG tablet Take 5 mg by mouth every morning. Reported on 12/18/2015     FREESTYLE LITE test strip USE TO TEST TWICE A DAY 100 strip 1   insulin isophane & regular human (HUMULIN 70/30 KWIKPEN) (70-30) 100 UNIT/ML KwikPen Inject 20 Units into the skin in the morning and at bedtime. 45 mL 2   Insulin Pen Needle (PEN NEEDLES) 31G X 5 MM MISC 1 each by Does not apply route 2 (two) times daily. 180 each 2   lisinopril (ZESTRIL) 20 MG tablet Take 1 tablet (20 mg total) by mouth daily. 90 tablet 3   metFORMIN (GLUCOPHAGE-XR) 750 MG 24 hr tablet TAKE 2 TABLETS BY MOUTH DAILY WITH BREAKFAST. 180 tablet 1   metoprolol succinate (TOPROL-XL) 25 MG 24 hr tablet TAKE 1 TABLET BY MOUTH EVERY DAY 90 tablet 1   Omega-3 Fatty Acids (OMEGA-3 FISH OIL PO) Take 1 capsule by mouth daily.     prednisoLONE acetate (PRED FORTE) 1 % ophthalmic suspension PLACE 1 DROP INTO THE RIGHT EYE DAILY.     traMADol (ULTRAM) 50 MG tablet Take 1 tablet (50 mg total) by mouth every 6 (six) hours as needed for moderate pain. Post-operatively 15 tablet 0   No current facility-administered medications for this visit.    REVIEW OF SYSTEMS:  [X]  denotes  positive finding, [ ]  denotes negative finding Cardiac  Comments:  Chest pain or chest pressure:    Shortness of breath upon exertion:    Short of breath when lying flat:    Irregular heart rhythm:  Vascular    Pain in calf, thigh, or hip brought on by ambulation:    Pain in feet at night that wakes you up from your sleep:     Blood clot in your veins:    Leg swelling:         Pulmonary    Oxygen at home:    Productive cough:     Wheezing:         Neurologic    Sudden weakness in arms or legs:     Sudden numbness in arms or legs:     Sudden onset of difficulty speaking or slurred speech:    Temporary loss of vision in one eye:     Problems with dizziness:         Gastrointestinal    Blood in stool:     Vomited blood:         Genitourinary    Burning when urinating:     Blood in urine:        Psychiatric    Major depression:         Hematologic    Bleeding problems:    Problems with blood clotting too easily:        Skin    Rashes or ulcers:        Constitutional    Fever or chills:      PHYSICAL EXAM: There were no vitals filed for this visit.   GENERAL: The patient is a well-nourished male, in no acute distress. The vital signs are documented above. CARDIAC: There is a regular rate and rhythm.  VASCULAR:  2+ palpable femoral pulse bilaterally No palpable pedal pulses PULMONARY: No respiratory distress. ABDOMEN: Soft and non-tender.  No pain with palpation of aneurysm. MUSCULOSKELETAL: There are no major deformities or cyanosis. NEUROLOGIC: No focal weakness or paresthesias are detected. SKIN: There are no ulcers or rashes noted. PSYCHIATRIC: The patient has a normal affect.  DATA:   Reviewed his AAA duplex from today and aneurysm is 5.28 to 5.28 cm over the past 6 months with no significant interval change.  Assessment/Plan:  87 year old male presents for interval 6 month follow-up of known abdominal aortic aneurysm.  His aneurysm has shown  no significant change over the past 6 months and remains 5.28 cm by AAA duplex.  He has no pain with palpation of the aneurysm.  Discussed that current guidelines are to repair these at greater than 5.5 cm in men unless there is rapid growth or it is felt to be causing symptoms.  I will plan to see him again in 6 months with AAA duplex here in the office.  Marty Heck, MD [facility] Office: [phone]

## 2021-10-09 ENCOUNTER — Other Ambulatory Visit: Payer: Self-pay

## 2021-10-09 DIAGNOSIS — I7143 Infrarenal abdominal aortic aneurysm, without rupture: Secondary | ICD-10-CM

## 2021-10-31 DIAGNOSIS — E538 Deficiency of other specified B group vitamins: Secondary | ICD-10-CM | POA: Diagnosis not present

## 2021-12-09 DIAGNOSIS — E538 Deficiency of other specified B group vitamins: Secondary | ICD-10-CM | POA: Diagnosis not present

## 2021-12-11 ENCOUNTER — Other Ambulatory Visit: Payer: Self-pay | Admitting: Student

## 2021-12-11 NOTE — Telephone Encounter (Signed)
Prescription refill request for Eliquis received. Indication: Afib  Last office visit: 07/10/21 Ahmed Prima)  Scr: 1.00 (09/30/21)  Age: 88 Weight: 92.5kg  Appropriate dose and refill sent to requested pharmacy.

## 2022-01-05 ENCOUNTER — Other Ambulatory Visit: Payer: Self-pay | Admitting: Endocrinology

## 2022-01-06 DIAGNOSIS — E538 Deficiency of other specified B group vitamins: Secondary | ICD-10-CM | POA: Diagnosis not present

## 2022-01-07 ENCOUNTER — Ambulatory Visit (INDEPENDENT_AMBULATORY_CARE_PROVIDER_SITE_OTHER): Payer: Medicare Other | Admitting: Student

## 2022-01-07 ENCOUNTER — Encounter: Payer: Self-pay | Admitting: Student

## 2022-01-07 ENCOUNTER — Other Ambulatory Visit: Payer: Self-pay

## 2022-01-07 VITALS — BP 136/60 | HR 54 | Ht 72.0 in | Wt 199.0 lb

## 2022-01-07 DIAGNOSIS — I7143 Infrarenal abdominal aortic aneurysm, without rupture: Secondary | ICD-10-CM | POA: Diagnosis not present

## 2022-01-07 DIAGNOSIS — I10 Essential (primary) hypertension: Secondary | ICD-10-CM | POA: Diagnosis not present

## 2022-01-07 DIAGNOSIS — R600 Localized edema: Secondary | ICD-10-CM | POA: Diagnosis not present

## 2022-01-07 DIAGNOSIS — I25810 Atherosclerosis of coronary artery bypass graft(s) without angina pectoris: Secondary | ICD-10-CM | POA: Diagnosis not present

## 2022-01-07 DIAGNOSIS — I4821 Permanent atrial fibrillation: Secondary | ICD-10-CM

## 2022-01-07 MED ORDER — METOPROLOL SUCCINATE ER 25 MG PO TB24: 12.5000 mg | ORAL_TABLET | Freq: Every day | ORAL | 3 refills | Status: DC

## 2022-01-07 NOTE — Patient Instructions (Signed)
Medication Instructions:   Reduce Toprol-XL to 12.5mg  daily (cut current 25mg  tablets in half).   *If you need a refill on your cardiac medications before your next appointment, please call your pharmacy*    Follow-Up: At [facility], you and your health needs are our priority.  As part of our continuing mission to provide you with exceptional heart care, we have created designated Provider Care Teams.  These Care Teams include your primary Cardiologist (physician) and Advanced Practice Providers (APPs -  Physician Assistants and Nurse Practitioners) who all work together to provide you with the care you need, when you need it.  We recommend signing up for the patient portal called "MyChart".  Sign up information is provided on this After Visit Summary.  MyChart is used to connect with patients for Virtual Visits (Telemedicine).  Patients are able to view lab/test results, encounter notes, upcoming appointments, etc.  Non-urgent messages can be sent to your provider as well.   To learn more about what you can do with MyChart, go to [email or web address].    Your next appointment:   6 month(s)  The format for your next appointment:   In Person  Provider:   You will see one of the following Advanced Practice Providers on your designated Care Team:   [place], PA-C  Ermalinda Barrios, PA-C    Other Instructions  Let us know if your heart rate is staying consistently in the 50's or lower at home.

## 2022-01-07 NOTE — Progress Notes (Signed)
Cardiology Office Note    Date:  01/07/2022   ID:  Steve Bailey, DOB December 30, 1933, MRN [id number]  PCP:  Pieter Partridge, PA  Cardiologist: Previously Dr. Bronson Ing --> Needs to switch to new MD  Chief Complaint  Patient presents with   Follow-up    6 month visit    History of Present Illness:    Steve Bailey is a 88 y.o. male with past medical history of CAD (s/p CABG in 1997, patent grafts by cath in 2005, low-risk NST in 2012 and 12/2020), permanent atrial fibrillation, HTN, HLD, IDDM and AAA (at 5.2 cm in 09/2021) who presents to the office today for 2-month follow-up.   He was last examined by myself in 06/2021 and enjoyed remaining active outside and denied any recent anginal symptoms. He was continued on his current cardiac medications including Amlodipine, Atorvastatin, Eliquis, Lisinopril and Toprol-XL.    In talking with the patient and his daughter today, he reports overall doing well since his last office visit. He continues to remain active at baseline and recently started his garden earlier this week and has planted several elderberry plants as well. He denies any recent chest pain or dyspnea on exertion. No specific orthopnea or PND. He does experience intermittent lower extremity edema and reports not using compression stockings routinely.  He experiences easy bruising with Eliquis but no reported melena, hematochezia or hematuria.  Past Medical History:  Diagnosis Date   AAA (abdominal aortic aneurysm) followed by dr Bridgett Larsson   last duplex 06/28/2018 4.6 cm,  asymptomatic   Anticoagulant long-term use    eliquis   Benign localized prostatic hyperplasia with lower urinary tract symptoms (LUTS)    CAD (coronary artery disease) cardiologist-  dr Bronson Ing   remote CABG in 1997; prior PCI to the LAD i n1990; Nuclear study in February of 2012 normal with an EF of 60%    DDD (degenerative disc disease), cervical    Full dentures    History of radiation therapy  11/24/12-01/18/13   Prostate 78Gy/41fx   HOH (hard of hearing)    both   Hyperlipidemia    Hyperlipidemia    Hypertension    Permanent atrial fibrillation ([place]) 09/2012   followed by dr Bronson Ing   Phimosis    severe   Proliferative diabetic retinopathy ([place])    both eyes   Prostate cancer [facility]) urologist-- dr Tresa Moore   dx 09-02-2012-- Stage T2a, Gleason 3+4=7, PSA 8.12, vol 103 cc--- completed external beam radiation 02/ 2014;  recurrent 2015 , started hormone therapy   S/P CABG x 3 1997   Strains to urinate    Type 2 diabetes mellitus treated with insulin [facility])    endocrinologist-- dr Dwyane Dee    Past Surgical History:  Procedure Laterality Date   CARDIAC CATHETERIZATION  01-18-2004   dr Doreatha Lew   patent grafts, ef 55%, minimal anterior hypokinesis,  severe total occlusion of LCFx and LAD,  severe disease of D2 and segmental RCA   CARDIOVASCULAR STRESS TEST  01/07/2011   normal nuclear study w/ no ischemia/  normal LV function and wall motion,  ef 60%   CARPAL TUNNEL RELEASE Right    CATARACT EXTRACTION W/ INTRAOCULAR LENS  IMPLANT, BILATERAL  left 2006;  right 2010   CIRCUMCISION N/A 07/28/2018   Procedure: CIRCUMCISION ADULT;  Surgeon: Alexis Frock, MD;  Location: [facility];  Service: Urology;  Laterality: N/A;   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   CORONARY ANGIOPLASTY  1990  LAD   CORONARY ARTERY BYPASS GRAFT  11-1995   shows distal LAD disease, without recurrent symptoms of angina with questionable mild apical ischemia but with normal EF    ESOPHAGOGASTRODUODENOSCOPY (EGD) WITH ESOPHAGEAL DILATION  05/2012   KNEE ARTHROSCOPY Left    TRANSTHORACIC ECHOCARDIOGRAM  10/10/2012   ef 55%, akinesis of the inferolateral wall,  grade 2 diastolic dysfunction/  trivial AR/  mild MR and TR/  mild LAE/  mild RVSF reduced and mild dilated RV    Current Medications: Outpatient Medications Prior to Visit  Medication Sig Dispense Refill   acetaminophen (TYLENOL) 500 MG  tablet Take 1,300 mg by mouth at bedtime.     alfuzosin (UROXATRAL) 10 MG 24 hr tablet Take 10 mg by mouth at bedtime. Reported on 12/18/2015     amLODipine (NORVASC) 10 MG tablet TAKE 1 TABLET BY MOUTH EVERY DAY 90 tablet 3   atorvastatin (LIPITOR) 40 MG tablet Take 1 tablet (40 mg total) by mouth daily. 90 tablet 3   Cinnamon 500 MG capsule Take 500 mg by mouth 2 (two) times daily.     cyanocobalamin (,VITAMIN B-12,) 1000 MCG/ML injection Inject into the muscle.     ELIQUIS 5 MG TABS tablet TAKE 1 TABLET BY MOUTH TWICE A DAY 180 tablet 2   finasteride (PROSCAR) 5 MG tablet Take 5 mg by mouth every morning. Reported on 12/18/2015     FREESTYLE LITE test strip USE TO TEST TWICE A DAY 100 strip 1   HUMULIN 70/30 KWIKPEN (70-30) 100 UNIT/ML KwikPen INJECT 20 UNITS INTO THE SKIN IN THE MORNING AND AT BEDTIME. 30 mL 4   Insulin Pen Needle (PEN NEEDLES) 31G X 5 MM MISC 1 each by Does not apply route 2 (two) times daily. 180 each 2   lisinopril (ZESTRIL) 20 MG tablet Take 1 tablet (20 mg total) by mouth daily. 90 tablet 3   metFORMIN (GLUCOPHAGE-XR) 750 MG 24 hr tablet TAKE 2 TABLETS BY MOUTH DAILY WITH BREAKFAST. 180 tablet 1   Omega-3 Fatty Acids (OMEGA-3 FISH OIL PO) Take 1 capsule by mouth daily.     prednisoLONE acetate (PRED FORTE) 1 % ophthalmic suspension PLACE 1 DROP INTO THE RIGHT EYE DAILY.     traMADol (ULTRAM) 50 MG tablet Take 1 tablet (50 mg total) by mouth every 6 (six) hours as needed for moderate pain. Post-operatively 15 tablet 0   metoprolol succinate (TOPROL-XL) 25 MG 24 hr tablet TAKE 1 TABLET BY MOUTH EVERY DAY 90 tablet 1   No facility-administered medications prior to visit.     Allergies:   Cephalexin   Social History   Socioeconomic History   Marital status: Widowed    Spouse name: Not on file   Number of children: Not on file   Years of education: Not on file   Highest education level: Not on file  Occupational History   Not on file  Tobacco Use   Smoking status:  Former    Years: 20.00    Types: Cigarettes    Quit date: 10/14/1979    Years since quitting: 42.2   Smokeless tobacco: Former    Types: Chew    Quit date: 11/23/2012  Vaping Use   Vaping Use: Never used  Substance and Sexual Activity   Alcohol use: No   Drug use: No   Sexual activity: Not on file  Other Topics Concern   Not on file  Social History Narrative   Not on file   Social Determinants  of Health   Financial Resource Strain: Not on file  Food Insecurity: Not on file  Transportation Needs: Not on file  Physical Activity: Not on file  Stress: Not on file  Social Connections: Not on file     Family History:  The patient's family history includes Cancer in his brother, sister, and sister; Diabetes in his brother; Heart attack in his mother, sister, sister, and sister; Heart disease in his brother, brother, mother, sister, sister, sister, and sister; Stroke in his brother, brother, and father.   Review of Systems:    Please see the history of present illness.     All other systems reviewed and are otherwise negative except as noted above.   Physical Exam:    VS:  BP 136/60    Pulse (!) 54    Ht 6' (1.829 m)    Wt 199 lb (90.3 kg)    SpO2 98%    BMI 26.99 kg/m    General: Pleasant elderly male appearing in no acute distress. Head: Normocephalic, atraumatic. Neck: No carotid bruits. JVD not elevated.  Lungs: Respirations regular and unlabored, without wheezes or rales.  Heart: Irregularly irregular. No S3 or S4.  No murmur, no rubs, or gallops appreciated. Abdomen: Appears non-distended. No obvious abdominal masses. Msk:  Strength and tone appear normal for age. No obvious joint deformities or effusions. Extremities: No clubbing or cyanosis. Trace lower extremity edema.  Distal pedal pulses are 2+ bilaterally. Neuro: Alert and oriented X 3. Moves all extremities spontaneously. No focal deficits noted. Psych:  Responds to questions appropriately with a normal  affect. Skin: No rashes or lesions noted  Wt Readings from Last 3 Encounters:  01/07/22 199 lb (90.3 kg)  10/07/21 204 lb (92.5 kg)  09/30/21 204 lb 9.6 oz (92.8 kg)     Studies/Labs Reviewed:   EKG:  EKG is ordered today.  The ekg ordered today demonstrates atrial fibrillation with slow-ventricular response, HR 48. No acute ST changes.   Recent Labs: 01/24/2021: Hemoglobin 11.6; Platelets 174.0 09/30/2021: ALT 24; BUN 17; Creatinine, Ser 1.00; Potassium 3.9; Sodium 139; TSH 4.40   Lipid Panel    Component Value Date/Time   CHOL 132 09/30/2021 1050   TRIG 47.0 09/30/2021 1050   HDL 56.40 09/30/2021 1050   CHOLHDL 2 09/30/2021 1050   VLDL 9.4 09/30/2021 1050   LDLCALC 66 09/30/2021 1050    Additional studies/ records that were reviewed today include:   NST: 12/2020 Blood pressure demonstrated a normal response to exercise. There was no ST segment deviation noted during stress.   Normal resting and stress perfusion. No ischemia or infarction EF 59% underlying rhythm afib   AAA: 09/2021 Summary:  Abdominal Aorta: There is evidence of abnormal dilatation of the distal  Abdominal aorta. The largest aortic diameter remains essentially unchanged  compared to prior exam. Previous diameter measurement was 5.28 x 4.93 cm  obtained on 03/25/21.   Assessment:    1. Coronary artery disease involving coronary bypass graft of native heart without angina pectoris   2. Permanent atrial fibrillation ([place])   3. Essential hypertension   4. Bilateral lower extremity edema   5. Infrarenal abdominal aortic aneurysm (AAA) without rupture      Plan:   In order of problems listed above:  1. CAD - He is s/p CABG in 1997 with patent grafts by cath in 2005. Most recent ischemic evaluation was a low-risk NST in 12/2020.  - He remains active at baseline and denies any  recent anginal symptoms.  - Will continue Atorvastatin and Toprol-XL (dose reduction as outlined below). He is no longer on  ASA given the need for anticoagulation.   2. Permanent Atrial Fibrillation - He denies any recent palpitations but his HR is in the 40's to 50's during today's visit. Will reduce Toprol-XL from 25mg  daily to 12.5mg  daily. I encouraged them to follow his HR at home and if this remains low, would likely discontinue Toprol-XL.  - No reports of active bleeding. He remains on Eliquis 5mg  BID for anticoagulation which is the appropriate dose at this time. Creatinine was at 1.00 in 09/2021.  3. HTN - His BP is well-controlled at 136/60 during today's visit. Continue current medication regimen with Amlodipine, Lisinopril and Toprol-XL with dose adjustment.   4. Lower Extremity Edema - He does experience intermittent lower extremity edema. He prefers to use compression stockings instead of diuretics and compliance with compression stockings was encouraged. We also reviewed the importance of elevating his lower extremities. If edema worsened despite conservative measures, could provide Rx for Lasix 20mg  PRN.   5. AAA  - Followed by Vascular Surgery. This measured at 5.2 cm in 09/2021.    Medication Adjustments/Labs and Tests Ordered: Current medicines are reviewed at length with the patient today.  Concerns regarding medicines are outlined above.  Medication changes, Labs and Tests ordered today are listed in the Patient Instructions below. Patient Instructions  Medication Instructions:   Reduce Toprol-XL to 12.5mg  daily (cut current 25mg  tablets in half).   *If you need a refill on your cardiac medications before your next appointment, please call your pharmacy*    Follow-Up: At [facility], you and your health needs are our priority.  As part of our continuing mission to provide you with exceptional heart care, we have created designated Provider Care Teams.  These Care Teams include your primary Cardiologist (physician) and Advanced Practice Providers (APPs -  Physician Assistants and Nurse  Practitioners) who all work together to provide you with the care you need, when you need it.  We recommend signing up for the patient portal called "MyChart".  Sign up information is provided on this After Visit Summary.  MyChart is used to connect with patients for Virtual Visits (Telemedicine).  Patients are able to view lab/test results, encounter notes, upcoming appointments, etc.  Non-urgent messages can be sent to your provider as well.   To learn more about what you can do with MyChart, go to [email or web address].    Your next appointment:   6 month(s)  The format for your next appointment:   In Person  Provider:   You will see one of the following Advanced Practice Providers on your designated Care Team:   [place], PA-C  Ermalinda Barrios, PA-C    Other Instructions  Let us know if your heart rate is staying consistently in the 50's or lower at home.     Signed, Erma Heritage, PA-C  01/07/2022 7:34 PM    [facility] [address] [place], [place] [address] Phone: [phone] Fax: [phone]

## 2022-01-27 DIAGNOSIS — Z794 Long term (current) use of insulin: Secondary | ICD-10-CM | POA: Diagnosis not present

## 2022-01-27 DIAGNOSIS — E113513 Type 2 diabetes mellitus with proliferative diabetic retinopathy with macular edema, bilateral: Secondary | ICD-10-CM | POA: Diagnosis not present

## 2022-01-27 DIAGNOSIS — Z961 Presence of intraocular lens: Secondary | ICD-10-CM | POA: Diagnosis not present

## 2022-01-27 DIAGNOSIS — Z7984 Long term (current) use of oral hypoglycemic drugs: Secondary | ICD-10-CM | POA: Diagnosis not present

## 2022-01-27 DIAGNOSIS — Z947 Corneal transplant status: Secondary | ICD-10-CM | POA: Diagnosis not present

## 2022-01-27 DIAGNOSIS — H353221 Exudative age-related macular degeneration, left eye, with active choroidal neovascularization: Secondary | ICD-10-CM | POA: Diagnosis not present

## 2022-01-27 DIAGNOSIS — H18513 Endothelial corneal dystrophy, bilateral: Secondary | ICD-10-CM | POA: Diagnosis not present

## 2022-01-28 ENCOUNTER — Other Ambulatory Visit: Payer: Self-pay

## 2022-01-28 ENCOUNTER — Ambulatory Visit (INDEPENDENT_AMBULATORY_CARE_PROVIDER_SITE_OTHER): Payer: Medicare Other | Admitting: Endocrinology

## 2022-01-28 ENCOUNTER — Encounter: Payer: Self-pay | Admitting: Endocrinology

## 2022-01-28 VITALS — BP 150/78 | HR 77 | Ht 72.0 in | Wt 204.0 lb

## 2022-01-28 DIAGNOSIS — E1165 Type 2 diabetes mellitus with hyperglycemia: Secondary | ICD-10-CM | POA: Diagnosis not present

## 2022-01-28 DIAGNOSIS — I25810 Atherosclerosis of coronary artery bypass graft(s) without angina pectoris: Secondary | ICD-10-CM

## 2022-01-28 DIAGNOSIS — I10 Essential (primary) hypertension: Secondary | ICD-10-CM | POA: Diagnosis not present

## 2022-01-28 DIAGNOSIS — Z794 Long term (current) use of insulin: Secondary | ICD-10-CM | POA: Diagnosis not present

## 2022-01-28 LAB — COMPREHENSIVE METABOLIC PANEL
ALT: 14 U/L (ref 0–53)
AST: 17 U/L (ref 0–37)
Albumin: 4.4 g/dL (ref 3.5–5.2)
Alkaline Phosphatase: 91 U/L (ref 39–117)
BUN: 24 mg/dL — ABNORMAL HIGH (ref 6–23)
CO2: 29 mEq/L (ref 19–32)
Calcium: 9.3 mg/dL (ref 8.4–10.5)
Chloride: 98 mEq/L (ref 96–112)
Creatinine, Ser: 1.21 mg/dL (ref 0.40–1.50)
GFR: 54.5 mL/min — ABNORMAL LOW (ref >60.00)
Glucose, Bld: 190 mg/dL — ABNORMAL HIGH (ref 70–99)
Potassium: 4.1 mEq/L (ref 3.5–5.1)
Sodium: 132 mEq/L — ABNORMAL LOW (ref 135–145)
Total Bilirubin: 1 mg/dL (ref 0.2–1.2)
Total Protein: 6.5 g/dL (ref 6.0–8.3)

## 2022-01-28 LAB — POCT GLYCOSYLATED HEMOGLOBIN (HGB A1C): Hemoglobin A1C: 7.6 % — AB (ref 4.0–5.6)

## 2022-01-28 LAB — MICROALBUMIN / CREATININE URINE RATIO
Creatinine,U: 113.1 mg/dL
Microalb Creat Ratio: 8.9 mg/g (ref 0.0–30.0)
Microalb, Ur: 10 mg/dL — ABNORMAL HIGH (ref 0.0–1.9)

## 2022-01-28 LAB — POCT GLUCOSE (DEVICE FOR HOME USE): POC Glucose: 209 mg/dl — AB (ref 70–99)

## 2022-01-28 NOTE — Progress Notes (Signed)
Patient ID: Steve Bailey, male   DOB: [birth year], 88 y.o.   MRN: [id number]    Reason for Appointment:  follow-up of various problems  History of Present Illness    Diagnosis: Type 2 DIABETES MELITUS, date of diagnosis: 1995              History:     Insulin regimen: Humulin 70/30 KwikPen,  20 units  a.c. before breakfast and dinner  Oral hypoglycemic drugs: Metformin ER 750 mg, 2 tabs daily   His A1c is higher than usual at 7.6  Current management, blood sugar patterns and problems: He did not bring his blood sugar monitor as always Conversation with the patient was difficult because of his hearing difficulty He probably checks his blood sugars infrequently and mostly sometimes in the evening and not fasting  He again does not remember what his blood sugars are  Today's blood sugar is 209 but he had a biscuit this morning and more carbohydrates last night  His daughter-in-law says that he is snacking more significantly in the evenings after dinner with chips and cookies Also still has not increased his exercise level His weight is up 4 pounds since last month He thinks he is taking his insulin BEFORE starting to eat usually and his daughter is usually with him in the evening Also thinks he takes his metformin consistently  Variable lunch  Glucometer: Freestyle    Carbohydrate intake: at meals: Mostly with vegetables, also getting some lean meats like Chicken and pork Egg, bacon, grits or biscuits in am    Weight history:  Wt Readings from Last 3 Encounters:  01/28/22 204 lb (92.5 kg)  01/07/22 199 lb (90.3 kg)  10/07/21 204 lb (92.5 kg)      Lab Results  Component Value Date   HGBA1C 6.7 (A) 09/30/2021   HGBA1C 7.0 (A) 05/27/2021   HGBA1C 6.2 (A) 01/24/2021   Lab Results  Component Value Date   MICROALBUR 18.6 (H) 01/24/2021   LDLCALC 66 09/30/2021   CREATININE 1.00 09/30/2021    Other active problems: See review of systems    Allergies  as of 01/28/2022       Reactions   Cephalexin Nausea And Vomiting        Medication List        Accurate as of January 28, 2022 10:41 AM. If you have any questions, ask your nurse or doctor.          acetaminophen 500 MG tablet Commonly known as: TYLENOL Take 1,300 mg by mouth at bedtime.   alfuzosin 10 MG 24 hr tablet Commonly known as: UROXATRAL Take 10 mg by mouth at bedtime. Reported on 12/18/2015   amLODipine 10 MG tablet Commonly known as: NORVASC TAKE 1 TABLET BY MOUTH EVERY DAY   atorvastatin 40 MG tablet Commonly known as: LIPITOR Take 1 tablet (40 mg total) by mouth daily.   Cinnamon 500 MG capsule Take 500 mg by mouth 2 (two) times daily.   cyanocobalamin 1000 MCG/ML injection Commonly known as: (VITAMIN B-12) Inject into the muscle.   Eliquis 5 MG Tabs tablet Generic drug: apixaban TAKE 1 TABLET BY MOUTH TWICE A DAY   finasteride 5 MG tablet Commonly known as: PROSCAR Take 5 mg by mouth every morning. Reported on 12/18/2015   FREESTYLE LITE test strip Generic drug: glucose blood USE TO TEST TWICE A DAY   HumuLIN 70/30 KwikPen (70-30) 100 UNIT/ML KwikPen Generic drug: insulin isophane & regular  human KwikPen INJECT 20 UNITS INTO THE SKIN IN THE MORNING AND AT BEDTIME.   lisinopril 20 MG tablet Commonly known as: ZESTRIL Take 1 tablet (20 mg total) by mouth daily.   metFORMIN 750 MG 24 hr tablet Commonly known as: GLUCOPHAGE-XR TAKE 2 TABLETS BY MOUTH DAILY WITH BREAKFAST.   metoprolol succinate 25 MG 24 hr tablet Commonly known as: TOPROL-XL Take 0.5 tablets (12.5 mg total) by mouth daily.   OMEGA-3 FISH OIL PO Take 1 capsule by mouth daily.   Pen Needles 31G X 5 MM Misc 1 each by Does not apply route 2 (two) times daily.   prednisoLONE acetate 1 % ophthalmic suspension Commonly known as: PRED FORTE PLACE 1 DROP INTO THE RIGHT EYE DAILY.   traMADol 50 MG tablet Commonly known as: Ultram Take 1 tablet (50 mg total) by mouth every 6  (six) hours as needed for moderate pain. Post-operatively        Allergies:  Allergies  Allergen Reactions   Cephalexin Nausea And Vomiting    Past Medical History:  Diagnosis Date   AAA (abdominal aortic aneurysm) followed by dr Bridgett Larsson   last duplex 06/28/2018 4.6 cm,  asymptomatic   Anticoagulant long-term use    eliquis   Benign localized prostatic hyperplasia with lower urinary tract symptoms (LUTS)    CAD (coronary artery disease) cardiologist-  dr Bronson Ing   remote CABG in 1997; prior PCI to the LAD i n1990; Nuclear study in February of 2012 normal with an EF of 60%    DDD (degenerative disc disease), cervical    Full dentures    History of radiation therapy 11/24/12-01/18/13   Prostate 78Gy/60f   HOH (hard of hearing)    both   Hyperlipidemia    Hyperlipidemia    Hypertension    [place]   followed by dr kBronson Ing  Phimosis    severe   [place]    [place]   Prostate [facility] ([facility]rolog[facility]-- dr mTresa Moore  dx 09-02-2012-- Stage T2a, Gleason 3+4=7, PSA 8.12, vol 103 cc--- completed external beam radiation 02/ 2014;  recurrent 2015 , started hormone therapy   S/P CABG x 3 1997   Strains to urinate    Type 2 diabetes mellitus treated with insulin ([facility]    endocrinologist-- dr kDwyane Dee   Past Surgical History:  Procedure Laterality Date   CARDIAC CATHETERIZATION  01-18-2004   dr tDoreatha Lew  patent grafts, ef 55%, minimal anterior hypokinesis,  severe total occlusion of LCFx and LAD,  severe disease of D2 and segmental RCA   CARDIOVASCULAR STRESS TEST  01/07/2011   normal nuclear study w/ no ischemia/  normal LV function and wall motion,  ef 60%   CARPAL TUNNEL RELEASE Right    CATARACT EXTRACTION W/ INTRAOCULAR LENS  IMPLANT, BILATERAL  left 2006;  right 2010   CIRCUMCISION N/A 07/28/2018   Procedure: CIRCUMCISION ADULT;  Surgeon: MAlexis Frock MD;  [facility]  Service:  Urology;  Laterality: N/A;   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   CORONARY ANGIOPLASTY  1990   LAD   CORONARY ARTERY BYPASS GRAFT  11-1995   shows distal LAD disease, without recurrent symptoms of angina with questionable mild apical ischemia but with normal EF    ESOPHAGOGASTRODUODENOSCOPY (EGD) WITH ESOPHAGEAL DILATION  05/2012   KNEE ARTHROSCOPY Left    TRANSTHORACIC ECHOCARDIOGRAM  10/10/2012   ef 55%, akinesis of the inferolateral wall,  grade 2  diastolic dysfunction/  trivial AR/  mild MR and TR/  mild LAE/  mild RVSF reduced and mild dilated RV    Family History  Problem Relation Age of Onset   Stroke Father    Heart attack Mother    Heart disease Mother    Cancer Sister        lung   Cancer Brother        prostate   Diabetes Brother    Stroke Brother    Stroke Brother    Heart disease Brother    Heart disease Brother    Heart attack Sister    Heart disease Sister    Heart attack Sister    Cancer Sister        breast   Heart disease Sister    Heart attack Sister    Heart disease Sister    Heart disease Sister     Social History:  reports that he quit smoking about 42 years ago. His smoking use included cigarettes. He quit smokeless tobacco use about 9 years ago.  His smokeless tobacco use included chew. He reports that he does not drink alcohol and does not use drugs.  ROS   HYPERTENSION:   Has been present for several years.    He has been treated with lisinopril 20 mg, amlodipine '10mg'$ , and metoprolol 25 mg His amlodipine has been prescribed by cardiologist  He has used his blood pressure meter at home periodically and last night reading was:145/72 Blood pressure was better with cardiology PA at last month's visit but repeat blood pressure today in the office was 818 systolic by myself  BP Readings from Last 3 Encounters:  01/28/22 (!) 150/78  01/07/22 136/60  10/07/21 (!) 160/77          HYPERLIPIDEMIA: Hypercholesterolemia is well controlled with 40 mg  Lipitor prescribed by his cardiologist Labs as follows:   Lab Results  Component Value Date   CHOL 132 09/30/2021   HDL 56.40 09/30/2021   [place] 66 09/30/2021   TRIG 47.0 09/30/2021   CHOLHDL 2 09/30/2021    Diabetic foot exam done in 7/22 showed normal monofilament sensation and absent pulses on the left as before   He is being followed by vascular surgeon for aortic aneurysm  He is being followed regularly by [facility] ophthalmology, has corneal dystrophy and macular edema as well as retinopathy  He refuses to take the Covid vaccine     Examination:   BP (!) 150/78    Pulse 77    Ht 6' (1.829 m)    Wt 204 lb (92.5 kg)    SpO2 98%    BMI 27.67 kg/m   Body mass index is 27.67 kg/m.     Assesment/PLAN:  DIABETES, Type II on insulin:  See history of present illness for  discussion of current diabetes management and problems identified  His A1c is higher than usual at 7.6  He has been long-term on 20 units premixed insulin twice a day using the insulin pens Compliance with insulin reportedly is fairly good Also on metformin ER 1500 mg  Considering his age and comorbid conditions as well as duration of his diabetes he does not need any more aggressive control However his daughter is going to try and help him cut back on snacks like cookies and chips Also he likely will be more active with the weather improving and blood sugars may be better  Reminded him to check his blood sugars by rotation  at different times instead of just in the evenings    HYPERTENSION: Blood pressure is unusually high today but he appears to be anxious He will call his cardiologist if his blood pressure continues to stay high at home  Follow-up in 3 months   There are no Patient Instructions on file for this visit.   Elayne Snare 01/28/2022, 10:41 AM

## 2022-01-28 NOTE — Patient Instructions (Addendum)
Check blood sugars on waking up 3 days a week ? ?Also check blood sugars about 2 hours after meals and do this after different meals by rotation ? ?Recommended blood sugar levels on waking up are 90-130 and about 2 hours after meal is 130-180 ? ?Please bring your blood sugar monitor to each visit, thank you ? ?Call if BP stays over 145-150 ?

## 2022-01-30 MED ORDER — FREESTYLE LITE W/DEVICE KIT: 1.0000 | PACK | Freq: Two times a day (BID) | 0 refills | Status: AC

## 2022-01-30 NOTE — Addendum Note (Signed)
Addended by: Cinda Quest on: 01/30/2022 09:20 AM ? ? Modules accepted: Orders ? ?

## 2022-02-04 DIAGNOSIS — E538 Deficiency of other specified B group vitamins: Secondary | ICD-10-CM | POA: Diagnosis not present

## 2022-02-13 DIAGNOSIS — H353221 Exudative age-related macular degeneration, left eye, with active choroidal neovascularization: Secondary | ICD-10-CM | POA: Diagnosis not present

## 2022-02-13 DIAGNOSIS — Z961 Presence of intraocular lens: Secondary | ICD-10-CM | POA: Diagnosis not present

## 2022-02-13 DIAGNOSIS — E113513 Type 2 diabetes mellitus with proliferative diabetic retinopathy with macular edema, bilateral: Secondary | ICD-10-CM | POA: Diagnosis not present

## 2022-02-13 DIAGNOSIS — Z947 Corneal transplant status: Secondary | ICD-10-CM | POA: Diagnosis not present

## 2022-02-13 DIAGNOSIS — H18512 Endothelial corneal dystrophy, left eye: Secondary | ICD-10-CM | POA: Diagnosis not present

## 2022-03-06 ENCOUNTER — Other Ambulatory Visit: Payer: Self-pay

## 2022-03-06 MED ORDER — ATORVASTATIN CALCIUM 40 MG PO TABS: 40.0000 mg | ORAL_TABLET | Freq: Every day | ORAL | 3 refills | Status: DC

## 2022-03-06 MED ORDER — LISINOPRIL 20 MG PO TABS: 20.0000 mg | ORAL_TABLET | Freq: Every day | ORAL | 3 refills | Status: DC

## 2022-03-11 ENCOUNTER — Other Ambulatory Visit: Payer: Self-pay

## 2022-03-11 DIAGNOSIS — E1165 Type 2 diabetes mellitus with hyperglycemia: Secondary | ICD-10-CM

## 2022-03-11 MED ORDER — METFORMIN HCL ER 750 MG PO TB24: 1500.0000 mg | ORAL_TABLET | Freq: Every day | ORAL | 1 refills | Status: DC

## 2022-03-23 DIAGNOSIS — C61 Malignant neoplasm of prostate: Secondary | ICD-10-CM | POA: Diagnosis not present

## 2022-03-30 DIAGNOSIS — N471 Phimosis: Secondary | ICD-10-CM | POA: Diagnosis not present

## 2022-03-30 DIAGNOSIS — C61 Malignant neoplasm of prostate: Secondary | ICD-10-CM | POA: Diagnosis not present

## 2022-03-30 DIAGNOSIS — R3912 Poor urinary stream: Secondary | ICD-10-CM | POA: Diagnosis not present

## 2022-04-06 ENCOUNTER — Ambulatory Visit
Admission: EM | Admit: 2022-04-06 | Discharge: 2022-04-06 | Discharge status: Absent without leave | Payer: Medicare Other

## 2022-04-06 ENCOUNTER — Ambulatory Visit: Payer: Self-pay

## 2022-04-06 DIAGNOSIS — Z7901 Long term (current) use of anticoagulants: Secondary | ICD-10-CM | POA: Diagnosis not present

## 2022-04-06 DIAGNOSIS — S29011A Strain of muscle and tendon of front wall of thorax, initial encounter: Secondary | ICD-10-CM | POA: Diagnosis not present

## 2022-04-06 DIAGNOSIS — S20212A Contusion of left front wall of thorax, initial encounter: Secondary | ICD-10-CM | POA: Diagnosis not present

## 2022-04-07 ENCOUNTER — Ambulatory Visit (HOSPITAL_COMMUNITY)
Admission: RE | Admit: 2022-04-07 | Discharge: 2022-04-07 | Disposition: A | Discharge status: Home / Self Care | Payer: Medicare Other | Source: Ambulatory Visit | Attending: Vascular Surgery | Admitting: Vascular Surgery

## 2022-04-07 ENCOUNTER — Ambulatory Visit (INDEPENDENT_AMBULATORY_CARE_PROVIDER_SITE_OTHER): Payer: Medicare Other | Admitting: Vascular Surgery

## 2022-04-07 ENCOUNTER — Encounter: Payer: Self-pay | Admitting: Vascular Surgery

## 2022-04-07 VITALS — BP 178/81 | HR 84 | Temp 97.8°F | Resp 18 | Ht 72.0 in | Wt 194.0 lb

## 2022-04-07 DIAGNOSIS — I7143 Infrarenal abdominal aortic aneurysm, without rupture: Secondary | ICD-10-CM | POA: Insufficient documentation

## 2022-04-07 NOTE — Progress Notes (Signed)
? ?Patient name: Steve Bailey MRN: [id number] DOB: August 24, 1934 Sex: male ? ?REASON FOR VISIT: 6 month follow-up for known 5.28 cm AAA  ? ?HPI: ?Steve Bailey is a 88 y.o. male with history of diabetes, hypertension, hyperlipidemia, prostate cancer, A. Fib on eliquis, known abdominal aortic aneurysm, coronary disease status post CABG that presents for another 6 month follow-up for surveillance of AAA.  He did recently fall off his couch and break a rib.  Patient has had a known aneurysm since 2017 when this was discovered incidentally on a CT after a fall.  At the time it was about 4.4 cm.  This has been followed by surveillance.  Last duplex 6 months ago showed it was 5.28 cm.  Patient has no new concerns today. ? ? ?Past Medical History:  ?Diagnosis Date  ? AAA (abdominal aortic aneurysm) ([place]) followed by dr Bridgett Larsson  ? last duplex 06/28/2018 4.6 cm,  asymptomatic  ? Anticoagulant long-term use   ? eliquis  ? Benign localized prostatic hyperplasia with lower urinary tract symptoms (LUTS)   ? CAD (coronary artery disease) cardiologist-  dr Bronson Ing  ? remote CABG in 1997; prior PCI to the LAD i n1990; Nuclear study in February of 2012 normal with an EF of 60%   ? DDD (degenerative disc disease), cervical   ? Full dentures   ? History of radiation therapy 11/24/12-01/18/13  ? Prostate 78Gy/56f  ? HOH (hard of hearing)   ? both  ? Hyperlipidemia   ? Hyperlipidemia   ? Hypertension   ? [place]  ? followed by dr kBronson Ing ? Phimosis   ? severe  ? Proliferative diabetic retinopathy ([place]   ? both eyes  ? Prostate cancer ([facility] urologist-- dr mTresa Moore ? dx 09-02-2012-- Stage T2a, Gleason 3+4=7, PSA 8.12, vol 103 cc--- completed external beam radiation 02/ 2014;  recurrent 2015 , started hormone therapy  ? S/P CABG x 3 1997  ? Strains to urinate   ? Type 2 diabetes mellitus [place] with insulin ([place]   ? endocrinologist-- dr kDwyane Dee ? ? ?Past Surgical History:  ?Procedure Laterality Date  ?  CARDIAC CATHETERIZATION  01-18-2004   dr tDoreatha Lew ? patent grafts, ef 55%, minimal anterior hypokinesis,  severe total occlusion of LCFx and LAD,  severe disease of D2 and segmental RCA  ? CARDIOVASCULAR STRESS TEST  01/07/2011  ? normal nuclear study w/ no ischemia/  normal LV function and wall motion,  ef 60%  ? CARPAL TUNNEL RELEASE Right   ? CATARACT EXTRACTION W/ INTRAOCULAR LENS  IMPLANT, BILATERAL  left 2006;  right 2010  ? CIRCUMCISION N/A 07/28/2018  ? Procedure: CIRCUMCISION ADULT;  Surgeon: MAlexis Frock MD;  [facility]  Service: Urology;  Laterality: N/A;  ? 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 CORONARY ANGIOPLASTY  1990  ? LAD  ? CORONARY ARTERY BYPASS GRAFT  11-1995  ? shows distal LAD disease, without recurrent symptoms of angina with questionable mild apical ischemia but with normal EF   ? ESOPHAGOGASTRODUODENOSCOPY (EGD) WITH ESOPHAGEAL DILATION  05/2012  ? KNEE ARTHROSCOPY Left   ? TRANSTHORACIC ECHOCARDIOGRAM  10/10/2012  ? ef 55%, akinesis of the inferolateral wall,  grade 2 diastolic dysfunction/  trivial AR/  mild MR and TR/  mild LAE/  mild RVSF reduced and mild dilated RV  ? ? ?Family History  ?Problem Relation Age of Onset  ? Stroke Father   ? Heart attack Mother   ?  Heart disease Mother   ? Cancer Sister   ?     lung  ? Cancer Brother   ?     prostate  ? Diabetes Brother   ? Stroke Brother   ? Stroke Brother   ? Heart disease Brother   ? Heart disease Brother   ? Heart attack Sister   ? Heart disease Sister   ? Heart attack Sister   ? Cancer Sister   ?     breast  ? Heart disease Sister   ? Heart attack Sister   ? Heart disease Sister   ? Heart disease Sister   ? ? ?SOCIAL HISTORY: ?Social History  ? ?Tobacco Use  ? Smoking status: Former  ?  Years: 20.00  ?  Types: Cigarettes  ?  Quit date: 10/14/1979  ?  Years since quitting: 42.5  ? Smokeless tobacco: Former  ?  Types: Chew  ?  Quit date: 11/23/2012  ?Substance Use Topics  ? Alcohol use: No  ? ? ?Allergies   ?Allergen Reactions  ? Cephalexin Nausea And Vomiting  ? ? ?Current Outpatient Medications  ?Medication Sig Dispense Refill  ? acetaminophen (TYLENOL) 500 MG tablet Take 1,300 mg by mouth at bedtime.    ? alfuzosin (UROXATRAL) 10 MG 24 hr tablet Take 10 mg by mouth at bedtime. Reported on 12/18/2015    ? amLODipine (NORVASC) 10 MG tablet TAKE 1 TABLET BY MOUTH EVERY DAY 90 tablet 3  ? atorvastatin (LIPITOR) 40 MG tablet Take 1 tablet (40 mg total) by mouth daily. 90 tablet 3  ? Blood Glucose Monitoring Suppl (FREESTYLE LITE) w/Device KIT 1 Device by Does not apply route 2 (two) times daily. 1 kit 0  ? Cinnamon 500 MG capsule Take 500 mg by mouth 2 (two) times daily.    ? cyanocobalamin (,VITAMIN B-12,) 1000 MCG/ML injection Inject into the muscle.    ? ELIQUIS 5 MG TABS tablet TAKE 1 TABLET BY MOUTH TWICE A DAY 180 tablet 2  ? finasteride (PROSCAR) 5 MG tablet Take 5 mg by mouth every morning. Reported on 12/18/2015    ? FREESTYLE LITE test strip USE TO TEST TWICE A DAY 100 strip 1  ? HUMULIN 70/30 KWIKPEN (70-30) 100 UNIT/ML KwikPen INJECT 20 UNITS INTO THE SKIN IN THE MORNING AND AT BEDTIME. 30 mL 4  ? Insulin Pen Needle (PEN NEEDLES) 31G X 5 MM MISC 1 each by Does not apply route 2 (two) times daily. 180 each 2  ? lisinopril (ZESTRIL) 20 MG tablet Take 1 tablet (20 mg total) by mouth daily. 90 tablet 3  ? metFORMIN (GLUCOPHAGE-XR) 750 MG 24 hr tablet Take 2 tablets (1,500 mg total) by mouth daily with breakfast. 180 tablet 1  ? metoprolol succinate (TOPROL-XL) 25 MG 24 hr tablet Take 0.5 tablets (12.5 mg total) by mouth daily. 45 tablet 3  ? Omega-3 Fatty Acids (OMEGA-3 FISH OIL PO) Take 1 capsule by mouth daily.    ? prednisoLONE acetate (PRED FORTE) 1 % ophthalmic suspension PLACE 1 DROP INTO THE RIGHT EYE DAILY.    ? traMADol (ULTRAM) 50 MG tablet Take 1 tablet (50 mg total) by mouth every 6 (six) hours as needed for moderate pain. Post-operatively 15 tablet 0  ? ?No current facility-administered medications  for this visit.  ? ? ?REVIEW OF SYSTEMS:  ?'[X]'  denotes positive finding, '[ ]'  denotes negative finding ?Cardiac  Comments:  ?Chest pain or chest pressure:    ?Shortness of breath upon exertion:    ?  Short of breath when lying flat:    ?Irregular heart rhythm:    ?    ?Vascular    ?Pain in calf, thigh, or hip brought on by ambulation:    ?Pain in feet at night that wakes you up from your sleep:     ?Blood clot in your veins:    ?Leg swelling:     ?    ?Pulmonary    ?Oxygen at home:    ?Productive cough:     ?Wheezing:     ?    ?Neurologic    ?Sudden weakness in arms or legs:     ?Sudden numbness in arms or legs:     ?Sudden onset of difficulty speaking or slurred speech:    ?Temporary loss of vision in one eye:     ?Problems with dizziness:     ?    ?Gastrointestinal    ?Blood in stool:     ?Vomited blood:     ?    ?Genitourinary    ?Burning when urinating:     ?Blood in urine:    ?    ?Psychiatric    ?Major depression:     ?    ?Hematologic    ?Bleeding problems:    ?Problems with blood clotting too easily:    ?    ?Skin    ?Rashes or ulcers:    ?    ?Constitutional    ?Fever or chills:    ? ? ?PHYSICAL EXAM: ?Vitals:  ? 04/07/22 1009  ?BP: (!) 178/81  ?Pulse: 84  ?Resp: 18  ?Temp: 97.8 ?F (36.6 ?C)  ?TempSrc: Temporal  ?SpO2: 97%  ?Weight: 194 lb (88 kg)  ?Height: 6' (1.829 m)  ? ? ? ?GENERAL: The patient is a well-nourished male, in no acute distress. The vital signs are documented above. ?CARDIAC: There is a regular rate and rhythm.  ?VASCULAR:  ?2+ palpable femoral pulse bilaterally ?No palpable pedal pulses ?PULMONARY: No respiratory distress. ?ABDOMEN: Soft and non-tender.  No pain with palpation of aneurysm. ?MUSCULOSKELETAL: There are no major deformities or cyanosis. ?NEUROLOGIC: No focal weakness or paresthesias are detected. ?SKIN: There are no ulcers or rashes noted. ?PSYCHIATRIC: The patient has a normal affect. ? ?DATA:  ? ?Reviewed his AAA duplex from today and aneurysm is stable at 5.27  cm ? ?Assessment/Plan: ? ?88 year old male presents for interval 6 month follow-up of known abdominal aortic aneurysm.  His aneurysm has shown no significant change over the past 6 months and measures 5.27 cm by AAA duplex to

## 2022-04-15 ENCOUNTER — Other Ambulatory Visit: Payer: Self-pay | Admitting: *Deleted

## 2022-04-15 DIAGNOSIS — I7143 Infrarenal abdominal aortic aneurysm, without rupture: Secondary | ICD-10-CM

## 2022-04-22 ENCOUNTER — Telehealth: Payer: Self-pay | Admitting: *Deleted

## 2022-04-22 ENCOUNTER — Other Ambulatory Visit: Payer: Self-pay

## 2022-04-22 DIAGNOSIS — E1165 Type 2 diabetes mellitus with hyperglycemia: Secondary | ICD-10-CM

## 2022-04-22 MED ORDER — AMLODIPINE BESYLATE 10 MG PO TABS: ORAL_TABLET | ORAL | 1 refills | Status: DC

## 2022-04-22 MED ORDER — HUMULIN 70/30 KWIKPEN (70-30) 100 UNIT/ML ~~LOC~~ SUPN: 20.0000 [IU] | PEN_INJECTOR | Freq: Two times a day (BID) | SUBCUTANEOUS | 4 refills | Status: DC

## 2022-04-22 NOTE — Telephone Encounter (Signed)
Rx refill sent to pharmacy. 

## 2022-04-30 ENCOUNTER — Ambulatory Visit (INDEPENDENT_AMBULATORY_CARE_PROVIDER_SITE_OTHER): Payer: Medicare Other | Admitting: Endocrinology

## 2022-04-30 ENCOUNTER — Encounter: Payer: Self-pay | Admitting: Endocrinology

## 2022-04-30 VITALS — BP 148/76 | HR 76 | Ht 72.0 in | Wt 198.4 lb

## 2022-04-30 DIAGNOSIS — I10 Essential (primary) hypertension: Secondary | ICD-10-CM | POA: Diagnosis not present

## 2022-04-30 DIAGNOSIS — I25810 Atherosclerosis of coronary artery bypass graft(s) without angina pectoris: Secondary | ICD-10-CM | POA: Diagnosis not present

## 2022-04-30 DIAGNOSIS — E1165 Type 2 diabetes mellitus with hyperglycemia: Secondary | ICD-10-CM

## 2022-04-30 DIAGNOSIS — Z794 Long term (current) use of insulin: Secondary | ICD-10-CM

## 2022-04-30 LAB — POCT GLYCOSYLATED HEMOGLOBIN (HGB A1C): Hemoglobin A1C: 7.1 % — AB (ref 4.0–5.6)

## 2022-04-30 NOTE — Progress Notes (Unsigned)
Patient ID: Steve Bailey, male   DOB: 08-17-1934, 88 y.o.   MRN: [id number]    Reason for Appointment:  follow-up of various problems  History of Present Illness    Diagnosis: Type 2 DIABETES MELITUS, date of diagnosis: 1995              History:     Insulin regimen: Humulin 70/30 KwikPen,  20 units  a.c. before breakfast and dinner  Oral hypoglycemic drugs: Metformin ER 750 mg, 2 tabs daily   His A1c is higher than usual at 7.6  Current management, blood sugar patterns and problems: He did not bring his blood sugar monitor as always Conversation with the patient was difficult because of his hearing difficulty He probably checks his blood sugars infrequently and mostly sometimes in the evening and not fasting  He again does not remember what his blood sugars are  Today's blood sugar is 209 but he had a biscuit this morning and more carbohydrates last night  His daughter-in-law says that he is snacking more significantly in the evenings after dinner with chips and cookies Also still has not increased his exercise level His weight is up 4 pounds since last month He thinks he is taking his insulin BEFORE starting to eat usually and his daughter is usually with him in the evening Also thinks he takes his metformin consistently  Variable lunch  Glucometer: Freestyle    Carbohydrate intake: at meals: Mostly with vegetables, also getting some lean meats like Chicken and pork Egg, bacon, grits or biscuits in am    Weight history:  Wt Readings from Last 3 Encounters:  04/30/22 198 lb 6.4 oz (90 kg)  04/07/22 194 lb (88 kg)  01/28/22 204 lb (92.5 kg)      Lab Results  Component Value Date   HGBA1C 7.6 (A) 01/28/2022   HGBA1C 6.7 (A) 09/30/2021   HGBA1C 7.0 (A) 05/27/2021   Lab Results  Component Value Date   MICROALBUR 10.0 (H) 01/28/2022   LDLCALC 66 09/30/2021   CREATININE 1.21 01/28/2022    Other active problems: See review of  systems    Allergies as of 04/30/2022       Reactions   Cephalexin Nausea And Vomiting        Medication List        Accurate as of April 30, 2022  4:51 PM. If you have any questions, ask your nurse or doctor.          acetaminophen 500 MG tablet Commonly known as: TYLENOL Take 1,300 mg by mouth at bedtime.   alfuzosin 10 MG 24 hr tablet Commonly known as: UROXATRAL Take 10 mg by mouth at bedtime. Reported on 12/18/2015   amLODipine 10 MG tablet Commonly known as: NORVASC TAKE 1 TABLET BY MOUTH EVERY DAY   atorvastatin 40 MG tablet Commonly known as: LIPITOR Take 1 tablet (40 mg total) by mouth daily.   Cinnamon 500 MG capsule Take 500 mg by mouth 2 (two) times daily.   Eliquis 5 MG Tabs tablet Generic drug: apixaban TAKE 1 TABLET BY MOUTH TWICE A DAY   finasteride 5 MG tablet Commonly known as: PROSCAR Take 5 mg by mouth every morning. Reported on 12/18/2015   FREESTYLE LITE test strip Generic drug: glucose blood USE TO TEST TWICE A DAY   FreeStyle Lite w/Device Kit 1 Device by Does not apply route 2 (two) times daily.   HumuLIN 70/30 KwikPen (70-30) 100 UNIT/ML KwikPen Generic  drug: insulin isophane & regular human KwikPen Inject 20 Units into the skin in the morning and at bedtime.   lisinopril 20 MG tablet Commonly known as: ZESTRIL Take 1 tablet (20 mg total) by mouth daily.   metFORMIN 750 MG 24 hr tablet Commonly known as: GLUCOPHAGE-XR Take 2 tablets (1,500 mg total) by mouth daily with breakfast.   methocarbamol 750 MG tablet Commonly known as: ROBAXIN Take by mouth.   metoprolol succinate 25 MG 24 hr tablet Commonly known as: TOPROL-XL Take 0.5 tablets (12.5 mg total) by mouth daily.   OMEGA-3 FISH OIL PO Take 1 capsule by mouth daily.   Pen Needles 31G X 5 MM Misc 1 each by Does not apply route 2 (two) times daily.   prednisoLONE acetate 1 % ophthalmic suspension Commonly known as: PRED FORTE PLACE 1 DROP INTO THE RIGHT EYE  DAILY.   traMADol 50 MG tablet Commonly known as: Ultram Take 1 tablet (50 mg total) by mouth every 6 (six) hours as needed for moderate pain. Post-operatively        Allergies:  Allergies  Allergen Reactions   Cephalexin Nausea And Vomiting    Past Medical History:  Diagnosis Date   AAA (abdominal aortic aneurysm) ([place]) followed by dr Bridgett Larsson   last duplex 06/28/2018 4.6 cm,  asymptomatic   Anticoagulant long-term use    eliquis   Benign localized prostatic hyperplasia with lower urinary tract symptoms (LUTS)    CAD (coronary artery disease) cardiologist-  dr Bronson Ing   remote CABG in 1997; prior PCI to the LAD i n1990; Nuclear study in February of 2012 normal with an EF of 60%    DDD (degenerative disc disease), cervical    Full dentures    History of radiation therapy 11/24/12-01/18/13   Prostate 78Gy/61f   HOH (hard of hearing)    both   Hyperlipidemia    Hyperlipidemia    Hypertension    [place] ([place] 09[place]   followed by dr kBronson Ing  Phimosis    severe   Proliferative diabetic retinopathy ([place]    both eyes   Prostate cancer ([facility] urologist-- dr mTresa Moore  dx 09-02-2012-- Stage T2a, Gleason 3+4=7, PSA 8.12, vol 103 cc--- completed external beam radiation 02/ 2014;  recurrent 2015 , started hormone therapy   S/P CABG x 3 1997   Strains to urinate    Type 2 diabetes mellitus treated with insulin ([facility]    endocrinologist-- dr kDwyane Dee   Past Surgical History:  Procedure Laterality Date   CARDIAC CATHETERIZATION  01-18-2004   dr tDoreatha Lew  patent grafts, ef 55%, minimal anterior hypokinesis,  severe total occlusion of LCFx and LAD,  severe disease of D2 and segmental RCA   CARDIOVASCULAR STRESS TEST  01/07/2011   normal nuclear study w/ no ischemia/  normal LV function and wall motion,  ef 60%   CARPAL TUNNEL RELEASE Right    CATARACT EXTRACTION W/ INTRAOCULAR LENS  IMPLANT, BILATERAL  left 2006;  right 2010   CIRCUMCISION N/A 07/28/2018    Procedure: CIRCUMCISION ADULT;  Surgeon: MAlexis Frock MD;  [facility]  Service: Urology;  Laterality: N/A;   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   CORONARY ANGIOPLASTY  1990   LAD   CORONARY ARTERY BYPASS GRAFT  11-1995   shows distal LAD disease, without recurrent symptoms of angina with questionable mild apical ischemia but with normal EF    ESOPHAGOGASTRODUODENOSCOPY (EGD) WITH ESOPHAGEAL DILATION  05/2012  KNEE ARTHROSCOPY Left    TRANSTHORACIC ECHOCARDIOGRAM  10/10/2012   ef 55%, akinesis of the inferolateral wall,  grade 2 diastolic dysfunction/  trivial AR/  mild MR and TR/  mild LAE/  mild RVSF reduced and mild dilated RV    Family History  Problem Relation Age of Onset   Stroke Father    Heart attack Mother    Heart disease Mother    Cancer Sister        lung   Cancer Brother        prostate   Diabetes Brother    Stroke Brother    Stroke Brother    Heart disease Brother    Heart disease Brother    Heart attack Sister    Heart disease Sister    Heart attack Sister    Cancer Sister        breast   Heart disease Sister    Heart attack Sister    Heart disease Sister    Heart disease Sister     Social History:  reports that he quit smoking about 42 years ago. His smoking use included cigarettes. He quit smokeless tobacco use about 9 years ago.  His smokeless tobacco use included chew. He reports that he does not drink alcohol and does not use drugs.  ROS   HYPERTENSION:   Has been present for several years.    He has been treated with lisinopril 20 mg, amlodipine 48m, and metoprolol 25 mg His amlodipine has been prescribed by cardiologist  He has used his blood pressure meter at home periodically and last night reading was:145/72  Blood pressure was better with cardiology PA at last month's visit but repeat blood pressure today in the office was 1161systolic by myself  BP Readings from Last 3 Encounters:  04/30/22 (!) 148/76   04/07/22 (!) 178/81  01/28/22 (!) 150/78          HYPERLIPIDEMIA: Hypercholesterolemia is well controlled with 40 mg Lipitor prescribed by his cardiologist Labs as follows:   Lab Results  Component Value Date   CHOL 132 09/30/2021   HDL 56.40 09/30/2021   LS[place]66 09/30/2021   TRIG 47.0 09/30/2021   CHOLHDL 2 09/30/2021    Diabetic foot exam done in 7/22 showed normal monofilament sensation and absent pulses on the left as before   He is being followed by vascular surgeon for aortic aneurysm  [facility], has corneal dystrophy and macular edema as well as retinopathy  He refuses to take the Covid vaccine     Examination:   BP (!) 148/76   Pulse 76   Ht 6' (1.829 m)   Wt 198 lb 6.4 oz (90 kg)   SpO2 97%   BMI 26.91 kg/m   Body mass index is 26.91 kg/m.     Assesment/PLAN:  DIABETES, Type II on insulin:  See history of present illness for  discussion of current diabetes management and problems identified  His A1c is higher than usual at 7.6  He has been long-term on 20 units premixed insulin twice a day using the insulin pens Compliance with insulin reportedly is fairly good Also on metformin ER 1500 mg  Considering his age and comorbid conditions as well as duration of his diabetes he does not need any more aggressive control However his daughter is going to try and help him cut back on snacks like cookies and chips  Also he likely will be  more active with the weather improving and blood sugars may be better  Reminded him to check his blood sugars by rotation at different times instead of just in the evenings    HYPERTENSION: Blood pressure is  high today but he appears to be anxious He will call his cardiologist if his blood pressure continues to stay high at home  Follow-up in 3 months   There are no Patient Instructions on file for this visit.   Elayne Snare 04/30/2022, 4:51 PM

## 2022-05-01 DIAGNOSIS — E538 Deficiency of other specified B group vitamins: Secondary | ICD-10-CM | POA: Diagnosis not present

## 2022-06-03 DIAGNOSIS — E538 Deficiency of other specified B group vitamins: Secondary | ICD-10-CM | POA: Diagnosis not present

## 2022-06-15 ENCOUNTER — Other Ambulatory Visit: Payer: Self-pay | Admitting: *Deleted

## 2022-06-15 MED ORDER — APIXABAN 5 MG PO TABS: 5.0000 mg | ORAL_TABLET | Freq: Two times a day (BID) | ORAL | 1 refills | Status: DC

## 2022-06-15 NOTE — Telephone Encounter (Signed)
Prescription refill request for Eliquis received. Indication: afib  Last office visit:[place] 01/07/2022 Scr: 1.21, 01/28/2022 Age: 88 yo  Weight: 90 kg   Pt is on the correct dose of Eliquis, refill sent.

## 2022-06-26 ENCOUNTER — Encounter: Payer: Self-pay | Admitting: Student

## 2022-06-26 ENCOUNTER — Ambulatory Visit (INDEPENDENT_AMBULATORY_CARE_PROVIDER_SITE_OTHER): Payer: Medicare Other | Admitting: Student

## 2022-06-26 VITALS — BP 138/70 | HR 69 | Ht 72.0 in | Wt 196.0 lb

## 2022-06-26 DIAGNOSIS — I7143 Infrarenal abdominal aortic aneurysm, without rupture: Secondary | ICD-10-CM | POA: Diagnosis not present

## 2022-06-26 DIAGNOSIS — E785 Hyperlipidemia, unspecified: Secondary | ICD-10-CM

## 2022-06-26 DIAGNOSIS — I25810 Atherosclerosis of coronary artery bypass graft(s) without angina pectoris: Secondary | ICD-10-CM | POA: Diagnosis not present

## 2022-06-26 DIAGNOSIS — I4821 Permanent atrial fibrillation: Secondary | ICD-10-CM

## 2022-06-26 DIAGNOSIS — I10 Essential (primary) hypertension: Secondary | ICD-10-CM

## 2022-06-26 NOTE — Progress Notes (Signed)
Cardiology Office Note    Date:  06/26/2022   ID:  [name], DOB Dec 12, 1933, MRN [id number]  PCP:  [facility]  Cardiologist: Previously Dr. Bronson Ing --> Needs to switch to new MD  Chief Complaint  Patient presents with   Follow-up    6 month visit    History of Present Illness:    Steve Bailey is a 88 y.o. male with past medical history of CAD (s/p CABG in 1997, patent grafts by cath in 2005, low-risk NST in 2012 and 12/2020), permanent atrial fibrillation, HTN, HLD, IDDM and AAA (at 5.2 cm in 09/2021) who presents to the office today for 63-monthfollow-up.  He was examined by myself in 12/2021 and was overall feeling well at that time and working in his garden routinely. He was continued on Atorvastatin, Eliquis, Amlodipine and Lisinopril but Toprol-XL was reduced from 25 mg daily to 12.5 mg daily given his baseline heart rate in the 40's to 50's  In talking with the patient and his daughter today, he reports overall doing well since his last office visit. He still enjoys gardening and taking care of his elderberry plants but tries to do this in the morning hours before it gets too hot outside. He denies any recent chest pain or dyspnea on exertion. No recent palpitations, orthopnea, PND or pitting edema. He did stop Toprol-XL given his slow heart rate and says his heart rate has now improved into the 60's when checked at home.    Past Medical History:  Diagnosis Date   [place] c[place]ridgett Larsson  last duplex 06/28/2018 4.6 cm,  asymptomatic   Anticoagulant long-term use    eliquis   Benign localized prostatic hyperplasia with lower urinary tract symptoms (LUTS)    CAD (coronary artery disease) cardiologist-  dr kBronson Ing  remote CABG in 1997; prior PCI to the LAD i n1990; Nuclear study in February of 2012 normal with an EF of 60%    DDD (degenerative disc disease), cervical    Full dentures    History of  radiation therapy 11/24/12-01/18/13   Prostate 78Gy/429f  HOH (hard of hearing)    both   Hyperlipidemia    Hyperlipidemia    Hypertension    [place]   follow[place] by dr koBronson Ing Phimosis    severe   Proliferative [place] retinopathy ([facility]   [place]   [facility] ma[facility]resa Moore dx 09-02-2012-- Stage T2a, Gleason 3+4=7, PSA 8.12, vol 103 cc--- completed external beam radiation 02/ 2014;  recurrent 2015 , started hormone therapy   S/P CABG x 3 1997   Strains to urinate    [facility]   [facility]ocrinologist-- dr kuDwyane Dee  Past Surgical History:  Procedure Laterality Date   CARDIAC CATHETERIZATION  01-18-2004   dr teDoreatha Lew patent grafts, ef 55%, minimal anterior hypokinesis,  severe total occlusion of LCFx and LAD,  severe disease of D2 and segmental RCA   CARDIOVASCULAR STRESS TEST  01/07/2011   normal nuclear study w/ no ischemia/  normal LV function and wall motion,  ef 60%   CARPAL TUNNEL RELEASE Right    CATARACT EXTRACTION W/ INTRAOCULAR LENS  IMPLANT, BILATERAL  left 2006;  right 2010   CIRCUMCISION N/A 07/28/2018   Procedure: CIRCUMCISION ADULT;  Surgeon: MaAlexis FrockMD;  Location: [facility];  Service: Urology;  Laterality: N/A;   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   CORONARY ANGIOPLASTY  1990   LAD   CORONARY ARTERY BYPASS GRAFT  11-1995   shows distal LAD disease, without recurrent symptoms of angina with questionable mild apical ischemia but with normal EF    ESOPHAGOGASTRODUODENOSCOPY (EGD) WITH ESOPHAGEAL DILATION  05/2012   KNEE ARTHROSCOPY Left    TRANSTHORACIC ECHOCARDIOGRAM  10/10/2012   ef 55%, akinesis of the inferolateral wall,  grade 2 diastolic dysfunction/  trivial AR/  mild MR and TR/  mild LAE/  mild RVSF reduced and mild dilated RV    Current Medications: Outpatient Medications Prior to Visit  Medication Sig Dispense Refill   acetaminophen  (TYLENOL) 500 MG tablet Take 1,300 mg by mouth at bedtime.     alfuzosin (UROXATRAL) 10 MG 24 hr tablet Take 10 mg by mouth at bedtime. Reported on 12/18/2015     amLODipine (NORVASC) 10 MG tablet TAKE 1 TABLET BY MOUTH EVERY DAY 90 tablet 1   apixaban (ELIQUIS) 5 MG TABS tablet Take 1 tablet (5 mg total) by mouth 2 (two) times daily. 180 tablet 1   atorvastatin (LIPITOR) 40 MG tablet Take 1 tablet (40 mg total) by mouth daily. 90 tablet 3   Blood Glucose Monitoring Suppl (FREESTYLE LITE) w/Device KIT 1 Device by Does not apply route 2 (two) times daily. 1 kit 0   Cinnamon 500 MG capsule Take 500 mg by mouth 2 (two) times daily.     finasteride (PROSCAR) 5 MG tablet Take 5 mg by mouth every morning. Reported on 12/18/2015     FREESTYLE LITE test strip USE TO TEST TWICE A DAY 100 strip 1   insulin isophane & regular human KwikPen (HUMULIN 70/30 KWIKPEN) (70-30) 100 UNIT/ML KwikPen Inject 20 Units into the skin in the morning and at bedtime. 30 mL 4   Insulin Pen Needle (PEN NEEDLES) 31G X 5 MM MISC 1 each by Does not apply route 2 (two) times daily. 180 each 2   lisinopril (ZESTRIL) 20 MG tablet Take 1 tablet (20 mg total) by mouth daily. 90 tablet 3   metFORMIN (GLUCOPHAGE-XR) 750 MG 24 hr tablet Take 2 tablets (1,500 mg total) by mouth daily with breakfast. 180 tablet 1   Omega-3 Fatty Acids (OMEGA-3 FISH OIL PO) Take 1 capsule by mouth daily.     prednisoLONE acetate (PRED FORTE) 1 % ophthalmic suspension PLACE 1 DROP INTO THE RIGHT EYE DAILY.     traMADol (ULTRAM) 50 MG tablet Take 1 tablet (50 mg total) by mouth every 6 (six) hours as needed for moderate pain. Post-operatively (Patient not taking: Reported on 06/26/2022) 15 tablet 0   methocarbamol (ROBAXIN) 750 MG tablet Take by mouth.     metoprolol succinate (TOPROL-XL) 25 MG 24 hr tablet Take 0.5 tablets (12.5 mg total) by mouth daily. (Patient not taking: Reported on 06/26/2022) 45 tablet 3   No facility-administered medications prior to visit.      Allergies:   Cephalexin   Social History   Socioeconomic History   Marital status: Widowed    Spouse name: Not on file   Number of children: Not on file   Years of education: Not on file   Highest education level: Not on file  Occupational History   Not on file  Tobacco Use   Smoking status: Former    Years: 20.00    Types: Cigarettes    Quit date: 10/14/1979    Years  since quitting: 42.7   Smokeless tobacco: Former    Types: Chew    Quit date: 11/23/2012  Vaping Use   Vaping Use: Never used  Substance and Sexual Activity   Alcohol use: No   Drug use: No   Sexual activity: Not on file  Other Topics Concern   Not on file  Social History Narrative   Not on file   Social Determinants of Health   Financial Resource Strain: Not on file  Food Insecurity: Not on file  Transportation Needs: Not on file  Physical Activity: Not on file  Stress: Not on file  Social Connections: Not on file     Family History:  The patient's family history includes Cancer in his brother, sister, and sister; Diabetes in his brother; Heart attack in his mother, sister, sister, and sister; Heart disease in his brother, brother, mother, sister, sister, sister, and sister; Stroke in his brother, brother, and father.   Review of Systems:    Please see the history of present illness.     All other systems reviewed and are otherwise negative except as noted above.   Physical Exam:    VS:  BP 138/70   Pulse 69   Ht 6' (1.829 m)   Wt 196 lb (88.9 kg)   SpO2 98%   BMI 26.58 kg/m    General: Pleasant elderly male appearing in no acute distress. Head: Normocephalic, atraumatic. Neck: No carotid bruits. JVD not elevated.  Lungs: Respirations regular and unlabored, without wheezes or rales.  Heart: Irregularly irregular. No S3 or S4.  No murmur, no rubs, or gallops appreciated. Abdomen: Appears non-distended. No obvious abdominal masses. Msk:  Strength and tone appear normal for age. No  obvious joint deformities or effusions. Extremities: No clubbing or cyanosis. No pitting edema.  Distal pedal pulses are 2+ bilaterally. Neuro: Alert and oriented X 3. Moves all extremities spontaneously. No focal deficits noted. Psych:  Responds to questions appropriately with a normal affect. Skin: No rashes or lesions noted  Wt Readings from Last 3 Encounters:  06/26/22 196 lb (88.9 kg)  04/30/22 198 lb 6.4 oz (90 kg)  04/07/22 194 lb (88 kg)    Studies/Labs Reviewed:   EKG:  EKG is not ordered today.  Recent Labs: 09/30/2021: TSH 4.40 01/28/2022: ALT 14; BUN 24; Creatinine, Ser 1.21; Potassium 4.1; Sodium 132   Lipid Panel    Component Value Date/Time   CHOL 132 09/30/2021 1050   TRIG 47.0 09/30/2021 1050   HDL 56.40 09/30/2021 1050   CHOLHDL 2 09/30/2021 1050   VLDL 9.4 09/30/2021 1050   LDLCALC 66 09/30/2021 1050    Additional studies/ records that were reviewed today include:   NST: 12/2020 Blood pressure demonstrated a normal response to exercise. There was no ST segment deviation noted during stress.   Normal resting and stress perfusion. No ischemia or infarction EF 59% underlying rhythm afib    AAA Duplex: 03/2022 Summary:  Abdominal Aorta: There is evidence of abnormal dilatation of the mid and  distal Abdominal aorta. The largest aortic measurement is 5.27 cm. The  largest aortic diameter remains essentially unchanged compared to prior  exam. Previous diameter measurement  was 5.28 cm obtained on 10/07/2021.    Assessment:    1. Coronary artery disease involving coronary bypass graft of native heart without angina pectoris   2. Permanent atrial fibrillation ([place])   3. Essential hypertension   4. Hyperlipidemia LDL goal <70   5. Infrarenal abdominal aortic aneurysm (  AAA) without rupture ([place])      Plan:   In order of problems listed above:  1. CAD - He is s/p CABG in 1997 with patent grafts by cath in 2005 and had a low-risk NST in 12/2020.   He remains active at baseline for his age and denies any recent anginal symptoms. - Continue Atorvastatin 27m daily and Toprol-XL 12.5336mdaily.   2. Permanent Atrial Fibrillation - HR is well-controlled in the 60's during today's visit. No longer on AV nodal blocking agents.  - On Eliquis 36m72mID for anticoagulation which is the appropriate dose at this time given his age, weight and renal function (creatinine at 1.21 in 01/2022).   3. HTN - BP is well-controlled at 138/70 during today's visit. Continue current medical therapy with Amlodipine 10 mg daily and Lisinopril 20 mg daily.   4. HLD - FLP in 09/2021 showed his LDL 66. Continue Atorvastatin 40 mg daily.  5. AAA - Followed by Vascular Surgery. This measured 5.2 cm by imaging in 03/2022.   Medication Adjustments/Labs and Tests Ordered: Current medicines are reviewed at length with the patient today.  Concerns regarding medicines are outlined above.  Medication changes, Labs and Tests ordered today are listed in the Patient Instructions below. Patient Instructions  Medication Instructions:  Your physician recommends that you continue on your current medications as directed. Please refer to the Current Medication list given to you today.   Labwork: None  Testing/Procedures: None  Follow-Up: Follow up with an APP in 6 months.   Any Other Special Instructions Will Be Listed Below (If Applicable).     If you need a refill on your cardiac medications before your next appointment, please call your pharmacy.    Signed, BriErma HeritageA-C  06/26/2022 4:41 PM    [place] [address] Centre[place] [id number]: [phone]: [phone]

## 2022-06-26 NOTE — Patient Instructions (Signed)
Medication Instructions:  Your physician recommends that you continue on your current medications as directed. Please refer to the Current Medication list given to you today.   Labwork: None  Testing/Procedures: None  Follow-Up: Follow up with an APP in 6 months.   Any Other Special Instructions Will Be Listed Below (If Applicable).     If you need a refill on your cardiac medications before your next appointment, please call your pharmacy.

## 2022-07-07 DIAGNOSIS — E538 Deficiency of other specified B group vitamins: Secondary | ICD-10-CM | POA: Diagnosis not present

## 2022-07-29 DIAGNOSIS — Z794 Long term (current) use of insulin: Secondary | ICD-10-CM | POA: Diagnosis not present

## 2022-07-29 DIAGNOSIS — I2510 Atherosclerotic heart disease of native coronary artery without angina pectoris: Secondary | ICD-10-CM | POA: Diagnosis not present

## 2022-07-29 DIAGNOSIS — Z7984 Long term (current) use of oral hypoglycemic drugs: Secondary | ICD-10-CM | POA: Diagnosis not present

## 2022-07-29 DIAGNOSIS — Z0000 Encounter for general adult medical examination without abnormal findings: Secondary | ICD-10-CM | POA: Diagnosis not present

## 2022-07-29 DIAGNOSIS — D649 Anemia, unspecified: Secondary | ICD-10-CM | POA: Diagnosis not present

## 2022-07-29 DIAGNOSIS — I714 Abdominal aortic aneurysm, without rupture, unspecified: Secondary | ICD-10-CM | POA: Diagnosis not present

## 2022-07-29 DIAGNOSIS — C61 Malignant neoplasm of prostate: Secondary | ICD-10-CM | POA: Diagnosis not present

## 2022-07-29 DIAGNOSIS — Z23 Encounter for immunization: Secondary | ICD-10-CM | POA: Diagnosis not present

## 2022-07-29 DIAGNOSIS — I4821 Permanent atrial fibrillation: Secondary | ICD-10-CM | POA: Diagnosis not present

## 2022-07-29 DIAGNOSIS — E113513 Type 2 diabetes mellitus with proliferative diabetic retinopathy with macular edema, bilateral: Secondary | ICD-10-CM | POA: Diagnosis not present

## 2022-07-29 DIAGNOSIS — E538 Deficiency of other specified B group vitamins: Secondary | ICD-10-CM | POA: Diagnosis not present

## 2022-08-04 ENCOUNTER — Other Ambulatory Visit (HOSPITAL_COMMUNITY): Payer: Self-pay | Admitting: Urology

## 2022-08-04 ENCOUNTER — Other Ambulatory Visit: Payer: Self-pay | Admitting: Urology

## 2022-08-04 DIAGNOSIS — C61 Malignant neoplasm of prostate: Secondary | ICD-10-CM

## 2022-08-09 ENCOUNTER — Encounter (HOSPITAL_COMMUNITY): Payer: Self-pay | Admitting: Emergency Medicine

## 2022-08-09 ENCOUNTER — Emergency Department (HOSPITAL_COMMUNITY)
Admission: EM | Admit: 2022-08-09 | Discharge: 2022-08-09 | Disposition: A | Discharge status: Home / Self Care | Payer: Medicare Other | Attending: Emergency Medicine | Admitting: Emergency Medicine

## 2022-08-09 DIAGNOSIS — Z7901 Long term (current) use of anticoagulants: Secondary | ICD-10-CM | POA: Diagnosis not present

## 2022-08-09 DIAGNOSIS — R040 Epistaxis: Secondary | ICD-10-CM | POA: Insufficient documentation

## 2022-08-09 DIAGNOSIS — Z794 Long term (current) use of insulin: Secondary | ICD-10-CM | POA: Insufficient documentation

## 2022-08-09 DIAGNOSIS — Z7984 Long term (current) use of oral hypoglycemic drugs: Secondary | ICD-10-CM | POA: Insufficient documentation

## 2022-08-09 DIAGNOSIS — I10 Essential (primary) hypertension: Secondary | ICD-10-CM | POA: Diagnosis not present

## 2022-08-09 DIAGNOSIS — Z79899 Other long term (current) drug therapy: Secondary | ICD-10-CM | POA: Insufficient documentation

## 2022-08-09 LAB — CBC
HCT: 35.7 % — ABNORMAL LOW (ref 39.0–52.0)
Hemoglobin: 12 g/dL — ABNORMAL LOW (ref 13.0–17.0)
MCH: 33.1 pg (ref 26.0–34.0)
MCHC: 33.6 g/dL (ref 30.0–36.0)
MCV: 98.6 fL (ref 80.0–100.0)
Platelets: 155 10*3/uL (ref 150–400)
RBC: 3.62 MIL/uL — ABNORMAL LOW (ref 4.22–5.81)
RDW: 13.4 % (ref 11.5–15.5)
WBC: 3.9 10*3/uL — ABNORMAL LOW (ref 4.0–10.5)
nRBC: 0 % (ref 0.0–0.2)

## 2022-08-09 MED ORDER — OXYMETAZOLINE HCL 0.05 % NA SOLN
1.0000 | Freq: Once | NASAL | Status: AC
  Administered 2022-08-09: 1 via NASAL
  Filled 2022-08-09: qty 30

## 2022-08-09 NOTE — ED Triage Notes (Signed)
Pt states he was watching TV tonight got up to go to restroom and noticed that his nose was bleeding. Pt currently taking eliquis. No active bleeding at this time.

## 2022-08-09 NOTE — ED Provider Notes (Signed)
[facility] EMERGENCY DEPARTMENT Provider Note   CSN: [id number] Arrival date & time: 08/09/22  0015     History  Chief Complaint  Patient presents with   Epistaxis    SUBHAN HOOPES is a 88 y.o. male.  The history is provided by the patient and a relative.  Epistaxis Location:  R nare Severity:  Moderate Duration:  20 minutes Timing:  Constant Progression:  Resolved Chronicity:  New Context: anticoagulants and hypertension   Patient reports he was just finishing up watching a NASCAR race when he had blood come out of his right nose.  After about 20 minutes it improved, but they were concerned about his elevated blood pressure.  He is on Eliquis.  He takes daily blood pressure medicines No headache.  No other acute complaints     Home Medications Prior to Admission medications   Medication Sig Start Date End Date Taking? Authorizing Provider  acetaminophen (TYLENOL) 500 MG tablet Take 1,300 mg by mouth at bedtime.    [provider]  alfuzosin (UROXATRAL) 10 MG 24 hr tablet Take 10 mg by mouth at bedtime. Reported on 12/18/2015    [provider]  amLODipine (NORVASC) 10 MG tablet TAKE 1 TABLET BY MOUTH EVERY DAY 04/22/22   Ahmed Prima, [place] M, PA-C  apixaban (ELIQUIS) 5 MG TABS tablet Take 1 tablet (5 mg total) by mouth 2 (two) times daily. 06/15/22   Strader, Fransisco Hertz, PA-C  atorvastatin (LIPITOR) 40 MG tablet Take 1 tablet (40 mg total) by mouth daily. 03/06/22   Strader, Fransisco Hertz, PA-C  Blood Glucose Monitoring Suppl (FREESTYLE LITE) w/Device KIT 1 Device by Does not apply route 2 (two) times daily. 01/30/22   Elayne Snare, MD  Cinnamon 500 MG capsule Take 500 mg by mouth 2 (two) times daily.    [provider]  finasteride (PROSCAR) 5 MG tablet Take 5 mg by mouth every morning. Reported on 12/18/2015    [provider]  FREESTYLE LITE test strip USE TO TEST TWICE A DAY 10/18/20   Elayne Snare, MD  insulin isophane & regular human KwikPen  (HUMULIN 70/30 KWIKPEN) (70-30) 100 UNIT/ML KwikPen Inject 20 Units into the skin in the morning and at bedtime. 04/22/22   Elayne Snare, MD  Insulin Pen Needle (PEN NEEDLES) 31G X 5 MM MISC 1 each by Does not apply route 2 (two) times daily. 05/27/21   Elayne Snare, MD  lisinopril (ZESTRIL) 20 MG tablet Take 1 tablet (20 mg total) by mouth daily. 03/06/22   Strader, Fransisco Hertz, PA-C  metFORMIN (GLUCOPHAGE-XR) 750 MG 24 hr tablet Take 2 tablets (1,500 mg total) by mouth daily with breakfast. 03/11/22   Elayne Snare, MD  Omega-3 Fatty Acids (OMEGA-3 FISH OIL PO) Take 1 capsule by mouth daily.    [provider]  prednisoLONE acetate (PRED FORTE) 1 % ophthalmic suspension PLACE 1 DROP INTO THE RIGHT EYE DAILY. 06/24/19   [provider]  traMADol (ULTRAM) 50 MG tablet Take 1 tablet (50 mg total) by mouth every 6 (six) hours as needed for moderate pain. Post-operatively Patient not taking: Reported on 06/26/2022 07/28/18   Alexis Frock, MD      Allergies    Cephalexin    Review of Systems   Review of Systems  HENT:  Positive for nosebleeds.     Physical Exam Updated Vital Signs BP (!) 172/69   Pulse 62   Temp 98.4 F (36.9 C) (Oral)   Resp 16   Ht  1.829 m (6')   Wt 88.9 kg   SpO2 100%   BMI 26.58 kg/m  Physical Exam CONSTITUTIONAL: Elderly, no acute distress HEAD: Normocephalic/atraumatic EYES: EOMI/PERRL ENMT: Mucous membranes moist, dried blood noted to right nare, no active bleeding.  No blood in left nare.  No blood in oropharynx NECK: supple no meningeal signs NEURO: Pt is awake/alert/appropriate, moves all extremitiesx4.  No facial droop.   SKIN: warm, color normal PSYCH: no abnormalities of mood noted, alert and oriented to situation  ED Results / Procedures / Treatments   Labs (all labs ordered are listed, but only abnormal results are displayed) Labs Reviewed  CBC - Abnormal; Notable for the following components:      Result Value   WBC 3.9 (*)    RBC 3.62  (*)    Hemoglobin 12.0 (*)    HCT 35.7 (*)    All other components within normal limits    EKG None  Radiology No results found.  Procedures Procedures    Medications Ordered in ED Medications  oxymetazoline (AFRIN) 0.05 % nasal spray 1 spray (has no administration in time range)    ED Course/ Medical Decision Making/ A&P                           Medical Decision Making Amount and/or Complexity of Data Reviewed Labs: ordered.  Risk OTC drugs.   labs overall reassuring, no signs of acute anemia.  Bleeding has stopped at this time. Blood pressure starting to improve.  No acute treatment at this time.  He will be given Afrin at time of discharge and was explicitly told to only use if he has active bleeding.  He was instructed to squirt in his nose with bleeding, and hold pressure for 15 minutes.  If there is any bleeding after this, he will need to return to the ER.  He should not use Afrin at any other time due to his history of hypertension Patient and family agreeable with plan        Final Clinical Impression(s) / ED Diagnoses Final diagnoses:  Right-sided epistaxis    Rx / DC Orders ED Discharge Orders     None         Ripley Fraise, MD 08/09/22 0200

## 2022-08-10 ENCOUNTER — Telehealth: Payer: Self-pay | Admitting: Student

## 2022-08-10 NOTE — Telephone Encounter (Signed)
Patient seen 06/26/22 in Cardiology.  I spoke with daughter, Inez Catalina. She says her Dad had BBQ,  two ham biscuits, and salted peanuts over the weekend. He then chugged an OTC cold/cough syrup he had. She said he never measures,she advised him to throw out.She is going to get some that was safe for his blood pressure and low sugar for his diabetes.She said they have ongoing discussions regarding his sodium intake and diet/   His BP yesterday am was 131/67, by night time it was 157/70 . No further epistaxis.    I will forward to provider for any recommendations

## 2022-08-10 NOTE — Telephone Encounter (Signed)
I left message for daughter to track her Dad's BP's and call us back in a few days.

## 2022-08-10 NOTE — Telephone Encounter (Signed)
   I agree that his high-sodium intake combined with the unknown amount of cold medication likely caused the variability in his BP. Please see if they can follow readings over the next several days and let us know his readings. If BP remains above goal, we can further titrate Lisinopril.   Signed, Erma Heritage, PA-C 08/10/2022, 2:32 PM

## 2022-08-10 NOTE — Telephone Encounter (Signed)
Daughter is calling in because patient had to go to the er over the weekend. And needs a f/u appt but there is nothing available. Patient went in because of bp  Pt c/o BP issue: STAT if pt c/o blurred vision, one-sided weakness or slurred speech  1. What are your last 5 BP readings? 230/81; 172/69; 167/70; 155/70  2. Are you having any other symptoms (ex. Dizziness, headache, blurred vision, passed out)? no  3. What is your BP issue? Patient knows started bleeding that's why they went to the hospital. Please advise

## 2022-08-17 ENCOUNTER — Other Ambulatory Visit: Payer: Self-pay | Admitting: Endocrinology

## 2022-08-17 DIAGNOSIS — E1165 Type 2 diabetes mellitus with hyperglycemia: Secondary | ICD-10-CM

## 2022-08-26 DIAGNOSIS — D649 Anemia, unspecified: Secondary | ICD-10-CM | POA: Diagnosis not present

## 2022-08-26 DIAGNOSIS — I2510 Atherosclerotic heart disease of native coronary artery without angina pectoris: Secondary | ICD-10-CM | POA: Diagnosis not present

## 2022-08-26 DIAGNOSIS — C61 Malignant neoplasm of prostate: Secondary | ICD-10-CM | POA: Diagnosis not present

## 2022-08-26 DIAGNOSIS — E538 Deficiency of other specified B group vitamins: Secondary | ICD-10-CM | POA: Diagnosis not present

## 2022-08-27 DIAGNOSIS — D61818 Other pancytopenia: Secondary | ICD-10-CM | POA: Insufficient documentation

## 2022-09-02 ENCOUNTER — Ambulatory Visit (INDEPENDENT_AMBULATORY_CARE_PROVIDER_SITE_OTHER): Payer: Medicare Other | Admitting: Endocrinology

## 2022-09-02 ENCOUNTER — Encounter: Payer: Self-pay | Admitting: Endocrinology

## 2022-09-02 VITALS — BP 162/82 | HR 71 | Ht 72.0 in | Wt 197.0 lb

## 2022-09-02 DIAGNOSIS — E1165 Type 2 diabetes mellitus with hyperglycemia: Secondary | ICD-10-CM

## 2022-09-02 DIAGNOSIS — Z794 Long term (current) use of insulin: Secondary | ICD-10-CM | POA: Diagnosis not present

## 2022-09-02 DIAGNOSIS — I25810 Atherosclerosis of coronary artery bypass graft(s) without angina pectoris: Secondary | ICD-10-CM

## 2022-09-02 DIAGNOSIS — I10 Essential (primary) hypertension: Secondary | ICD-10-CM

## 2022-09-02 DIAGNOSIS — E1151 Type 2 diabetes mellitus with diabetic peripheral angiopathy without gangrene: Secondary | ICD-10-CM

## 2022-09-02 LAB — POCT GLYCOSYLATED HEMOGLOBIN (HGB A1C): Hemoglobin A1C: 6.7 % — AB (ref 4.0–5.6)

## 2022-09-02 NOTE — Progress Notes (Signed)
Patient ID: Steve Bailey, male   DOB: 1934/05/22, 88 y.o.   MRN: [id number]    Reason for Appointment:  follow-up of diabetes and related problems  History of Present Illness    Diagnosis: Type 2 DIABETES MELITUS, date of diagnosis: 1995              History:     Insulin regimen: Humulin 70/30 KwikPen,  20 units  a.c. before breakfast and dinner  Oral hypoglycemic drugs: Metformin ER 750 mg, 2 tabs daily   His A1c is slightly improved at 6.7   Current management, blood sugar patterns and problems: He has not checked his blood sugar lately and is reluctant to do this His weight is about the same on the last visit  His daughter is providing most of the history He recently is cutting back on eating sweets like cookies  Lab glucose done with his PCP was 167 fasting and late morning He is only moderately active Does not miss his insulin doses before meals   Glucometer: Freestyle lite, no readings available    Carbohydrate intake: at meals: Mostly with vegetables, also getting some lean meats like Chicken and pork Egg, bacon, grits or biscuits in am    Weight history:  Wt Readings from Last 3 Encounters:  09/02/22 197 lb (89.4 kg)  08/09/22 195 lb 15.8 oz (88.9 kg)  06/26/22 196 lb (88.9 kg)      Lab Results  Component Value Date   HGBA1C 6.7 (A) 09/02/2022   HGBA1C 7.1 (A) 04/30/2022   HGBA1C 7.6 (A) 01/28/2022   Lab Results  Component Value Date   MICROALBUR 10.0 (H) 01/28/2022   LDLCALC 66 09/30/2021   CREATININE 1.21 01/28/2022    Other active problems: See review of systems    Allergies as of 09/02/2022       Reactions   Cephalexin Nausea And Vomiting        Medication List        Accurate as of September 02, 2022  8:08 PM. If you have any questions, ask your nurse or doctor.          acetaminophen 500 MG tablet Commonly known as: TYLENOL Take 1,300 mg by mouth at bedtime.   alfuzosin 10 MG 24 hr tablet Commonly known  as: UROXATRAL Take 10 mg by mouth at bedtime. Reported on 12/18/2015   amLODipine 10 MG tablet Commonly known as: NORVASC TAKE 1 TABLET BY MOUTH EVERY DAY   apixaban 5 MG Tabs tablet Commonly known as: Eliquis Take 1 tablet (5 mg total) by mouth 2 (two) times daily.   atorvastatin 40 MG tablet Commonly known as: LIPITOR Take 1 tablet (40 mg total) by mouth daily.   Cinnamon 500 MG capsule Take 500 mg by mouth 2 (two) times daily.   finasteride 5 MG tablet Commonly known as: PROSCAR Take 5 mg by mouth every morning. Reported on 12/18/2015   FREESTYLE LITE test strip Generic drug: glucose blood USE TO TEST TWICE A DAY   FreeStyle Lite w/Device Kit 1 Device by Does not apply route 2 (two) times daily.   HumuLIN 70/30 KwikPen (70-30) 100 UNIT/ML KwikPen Generic drug: insulin isophane & regular human KwikPen Inject 20 Units into the skin in the morning and at bedtime.   lisinopril 20 MG tablet Commonly known as: ZESTRIL Take 1 tablet (20 mg total) by mouth daily.   metFORMIN 750 MG 24 hr tablet Commonly known as: GLUCOPHAGE-XR TAKE 2 TABLETS  DAILY WITH BREAKFAST   OMEGA-3 FISH OIL PO Take 1 capsule by mouth daily.   Pen Needles 31G X 5 MM Misc 1 each by Does not apply route 2 (two) times daily.   prednisoLONE acetate 1 % ophthalmic suspension Commonly known as: PRED FORTE PLACE 1 DROP INTO THE RIGHT EYE DAILY.   traMADol 50 MG tablet Commonly known as: Ultram Take 1 tablet (50 mg total) by mouth every 6 (six) hours as needed for moderate pain. Post-operatively        Allergies:  Allergies  Allergen Reactions   Cephalexin Nausea And Vomiting    Past Medical History:  Diagnosis Date   AAA (abdominal aortic aneurysm) ([place]) followed by dr Bridgett Larsson   last duplex 06/28/2018 4.6 cm,  asymptomatic   Anticoagulant long-term use    eliquis   Benign localized prostatic hyperplasia with lower urinary tract symptoms (LUTS)    CAD (coronary artery disease) cardiologist-   dr Bronson Ing   remote CABG in 1997; prior PCI to the LAD i n1990; Nuclear study in February of 2012 normal with an EF of 60%    DDD (degenerative disc disease), cervical    Full dentures    History of radiation therapy 11/24/12-01/18/13   Prostate 78Gy/71f   HOH (hard of hearing)    both   Hyperlipidemia    Hyperlipidemia    Hypertension    Permanent atrial fibrillation ([facility] 09/2012   followed by dr kBronson Ing  Phimosis    severe   Proliferative diabetic retinopathy ([facility]    both eyes   Prostate cancer ([facility] urologist-- dr mTresa Moore  dx 09-02-2012-- Stage T2a, Gleason 3+4=7, PSA 8.12, vol 103 cc--- completed external beam radiation 02/ 2014;  recurrent 2015 , started hormone therapy   S/P CABG x 3 1997   Strains to urinate    Type 2 diabetes mellitus treated with insulin ([facility]    endocrinologist-- dr kDwyane Dee   Past Surgical History:  Procedure Laterality Date   CARDIAC CATHETERIZATION  01-18-2004   dr tDoreatha Lew  patent grafts, ef 55%, minimal anterior hypokinesis,  severe total occlusion of LCFx and LAD,  severe disease of D2 and segmental RCA   CARDIOVASCULAR STRESS TEST  01/07/2011   normal nuclear study w/ no ischemia/  normal LV function and wall motion,  ef 60%   CARPAL TUNNEL RELEASE Right    CATARACT EXTRACTION W/ INTRAOCULAR LENS  IMPLANT, BILATERAL  left 2006;  right 2010   CIRCUMCISION N/A 07/28/2018   Procedure: CIRCUMCISION ADULT;  Surgeon: MAlexis Frock MD;  [facility]  Service: Urology;  Laterality: N/A;   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   CORONARY ANGIOPLASTY  1990   LAD   CORONARY ARTERY BYPASS GRAFT  11-1995   shows distal LAD disease, without recurrent symptoms of angina with questionable mild apical ischemia but with normal EF    ESOPHAGOGASTRODUODENOSCOPY (EGD) WITH ESOPHAGEAL DILATION  05/2012   KNEE ARTHROSCOPY Left    TRANSTHORACIC ECHOCARDIOGRAM  10/10/2012   ef 55%, akinesis of the inferolateral wall,  grade 2 diastolic  dysfunction/  trivial AR/  mild MR and TR/  mild LAE/  mild RVSF reduced and mild dilated RV    Family History  Problem Relation Age of Onset   Stroke Father    Heart attack Mother    Heart disease Mother    Cancer Sister        lung   Cancer Brother  prostate   Diabetes Brother    Stroke Brother    Stroke Brother    Heart disease Brother    Heart disease Brother    Heart attack Sister    Heart disease Sister    Heart attack Sister    Cancer Sister        breast   Heart disease Sister    Heart attack Sister    Heart disease Sister    Heart disease Sister     Social History:  reports that he quit smoking about 42 years ago. His smoking use included cigarettes. He quit smokeless tobacco use about 9 years ago.  His smokeless tobacco use included chew. He reports that he does not drink alcohol and does not use drugs.  ROS   HYPERTENSION:   Has been present for several years.    He has been treated with lisinopril 20 mg, amlodipine 74m His amlodipine has been prescribed by cardiologist  He generally has higher readings in the office compared to home but does not remember what his readings at home recently, his daughter thinks they are fairly good Blood pressure was 130/80 recently with his PCP and may have whitecoat syndrome However his home blood pressure monitor was reading 1962systolic in the office  BP Readings from Last 3 Encounters:  09/02/22 (!) 162/82  08/09/22 (!) 172/69  06/26/22 138/70          Microalbumin normal in 3/23 Renal function consistently normal  Lab Results  Component Value Date   CREATININE 1.21 01/28/2022   CREATININE 1.00 09/30/2021   CREATININE 1.04 01/24/2021    HYPERLIPIDEMIA: Hypercholesterolemia is well controlled with 40 mg Lipitor prescribed by his cardiologist Recently labs done by PCP showed LDL of 69 Labs as follows:   Lab Results  Component Value Date   CHOL 132 09/30/2021   HDL 56.40 09/30/2021   [place]66  09/30/2021   TRIG 47.0 09/30/2021   CHOLHDL 2 09/30/2021    Diabetic foot exam done in 7/22 showed normal monofilament sensation and absent pulses on the left as before   He is being followed by vascular surgeon for aortic aneurysm  [facility] has corneal dystrophy and macular edema as well as retinopathy  He refuses to take the Covid vaccine but has taken the influenza vaccine     Examination:   BP (!) 162/82   Pulse 71   Ht 6' (1.829 m)   Wt 197 lb (89.4 kg)   SpO2 99%   BMI 26.72 kg/m   Body mass index is 26.72 kg/m.   Diabetic Foot Exam - Simple   Simple Foot Form Diabetic Foot exam was performed with the following findings: Yes   Visual Inspection No deformities, no ulcerations, no other skin breakdown bilaterally: Yes See comments: Yes Sensation Testing Intact to touch and monofilament testing bilaterally: Yes See comments: Yes Pulse Check See comments: Yes Comments Roughening and thickening of the skin on the distal plantar surfaces present without any ulcerations or calluses Monofilament sensation is slightly decreased on the plantar surfaces but normal on the dorsal feet pedal pulses not palpable No cyanosis or coolness of the toes      Assesment/PLAN:  DIABETES, Type II on insulin:  See history of present illness for  discussion of current diabetes management and problems identified  His A1c is 6.7 and improved  He has been long-term on 20 units premixed insulin twice a day using the  insulin pens Also on metformin ER 1500 mg  Blood sugars are improving likely from cutting back on sweets like cookies He is also generally active during small summer months Blood sugar monitoring not being done at home and he is not motivated to do so Considering his age and comorbid conditions his level of control is adequate and will not make any changes However he will let us know if he starts getting any  hypoglycemia Discussed timing of insulin before breakfast and dinner    HYPERTENSION: Blood pressure is again higher today in the office but better with PCP  Lipids: Recently well controlled  Foot exam shows decreased pulses and may have mild early neuropathy  Follow-up in 4 months   There are no Patient Instructions on file for this visit.   Elayne Snare 09/02/2022, 8:08 PM

## 2022-09-16 DIAGNOSIS — C61 Malignant neoplasm of prostate: Secondary | ICD-10-CM | POA: Diagnosis not present

## 2022-09-23 ENCOUNTER — Encounter (HOSPITAL_COMMUNITY)
Admission: RE | Admit: 2022-09-23 | Discharge: 2022-09-23 | Disposition: A | Discharge status: Home / Self Care | Payer: Medicare Other | Source: Ambulatory Visit | Attending: Urology | Admitting: Urology

## 2022-09-23 DIAGNOSIS — C61 Malignant neoplasm of prostate: Secondary | ICD-10-CM | POA: Diagnosis not present

## 2022-09-23 DIAGNOSIS — K449 Diaphragmatic hernia without obstruction or gangrene: Secondary | ICD-10-CM | POA: Diagnosis not present

## 2022-09-23 DIAGNOSIS — K5730 Diverticulosis of large intestine without perforation or abscess without bleeding: Secondary | ICD-10-CM | POA: Diagnosis not present

## 2022-09-23 DIAGNOSIS — N3289 Other specified disorders of bladder: Secondary | ICD-10-CM | POA: Diagnosis not present

## 2022-09-23 MED ORDER — TECHNETIUM TC 99M MEDRONATE IV KIT
20.0000 | PACK | Freq: Once | INTRAVENOUS | Status: AC | PRN
  Administered 2022-09-23: 19.9 via INTRAVENOUS

## 2022-09-25 DIAGNOSIS — D61818 Other pancytopenia: Secondary | ICD-10-CM | POA: Diagnosis not present

## 2022-09-25 DIAGNOSIS — E538 Deficiency of other specified B group vitamins: Secondary | ICD-10-CM | POA: Diagnosis not present

## 2022-09-29 DIAGNOSIS — C61 Malignant neoplasm of prostate: Secondary | ICD-10-CM | POA: Diagnosis not present

## 2022-09-29 DIAGNOSIS — N471 Phimosis: Secondary | ICD-10-CM | POA: Diagnosis not present

## 2022-10-01 ENCOUNTER — Telehealth: Payer: Self-pay | Admitting: Student

## 2022-10-01 NOTE — Telephone Encounter (Signed)
Per review of pt's chart: -WBC: 4.3 -RBC: 3.44 -Hgb: 11.6 - Hct: 33.4

## 2022-10-01 NOTE — Telephone Encounter (Signed)
Pt's daughter notified and verbalized understanding. Pt's daughter had no other questions or concerns at this time.

## 2022-10-01 NOTE — Telephone Encounter (Signed)
New Message:       Patient Daughter called. She said patient's primary doctor told him to call and let his Cardiologist know that his red and white blood count is low and that  he is tired.

## 2022-10-01 NOTE — Telephone Encounter (Signed)
    This appears to be close to his baseline as his white blood cell count was previously at 3.6 in 08/2022 and Hgb was 11.5 at that time, meaning his numbers have actually improved over the past month.  Typically, his PCP would check additional blood work to figure out the etiology of his anemia such as iron levels, B12, folate etc. If he has done this and blood work has been reassuring, they will sometimes consider a referral to Hematology. His current values do not necessitate a change in his current cardiac medications and he is on the appropriate dose of Eliquis given his age, weight and renal function.  Signed, Erma Heritage, PA-C 10/01/2022, 9:00 AM

## 2022-10-02 ENCOUNTER — Ambulatory Visit: Payer: Medicare Other | Admitting: Endocrinology

## 2022-10-06 ENCOUNTER — Encounter: Payer: Self-pay | Admitting: Vascular Surgery

## 2022-10-06 ENCOUNTER — Ambulatory Visit (INDEPENDENT_AMBULATORY_CARE_PROVIDER_SITE_OTHER): Payer: Medicare Other | Admitting: Vascular Surgery

## 2022-10-06 ENCOUNTER — Ambulatory Visit (HOSPITAL_COMMUNITY)
Admission: RE | Admit: 2022-10-06 | Discharge: 2022-10-06 | Disposition: A | Discharge status: Home / Self Care | Payer: Medicare Other | Source: Ambulatory Visit | Attending: Vascular Surgery | Admitting: Vascular Surgery

## 2022-10-06 VITALS — BP 159/83 | HR 77 | Temp 98.0°F | Resp 18 | Ht 72.0 in | Wt 199.0 lb

## 2022-10-06 DIAGNOSIS — Z947 Corneal transplant status: Secondary | ICD-10-CM | POA: Diagnosis not present

## 2022-10-06 DIAGNOSIS — H353221 Exudative age-related macular degeneration, left eye, with active choroidal neovascularization: Secondary | ICD-10-CM | POA: Diagnosis not present

## 2022-10-06 DIAGNOSIS — Z7984 Long term (current) use of oral hypoglycemic drugs: Secondary | ICD-10-CM | POA: Diagnosis not present

## 2022-10-06 DIAGNOSIS — E113513 Type 2 diabetes mellitus with proliferative diabetic retinopathy with macular edema, bilateral: Secondary | ICD-10-CM | POA: Diagnosis not present

## 2022-10-06 DIAGNOSIS — I7143 Infrarenal abdominal aortic aneurysm, without rupture: Secondary | ICD-10-CM | POA: Diagnosis not present

## 2022-10-06 DIAGNOSIS — Z961 Presence of intraocular lens: Secondary | ICD-10-CM | POA: Diagnosis not present

## 2022-10-06 DIAGNOSIS — Z794 Long term (current) use of insulin: Secondary | ICD-10-CM | POA: Diagnosis not present

## 2022-10-06 DIAGNOSIS — H18513 Endothelial corneal dystrophy, bilateral: Secondary | ICD-10-CM | POA: Diagnosis not present

## 2022-10-06 NOTE — Progress Notes (Signed)
Patient name: Steve Bailey MRN: [id number] DOB: Sep 02, 1934 Sex: male  REASON FOR VISIT: 6 month follow-up for known 5.27 cm AAA   HPI: Steve Bailey is a 88 y.o. male with history of diabetes, hypertension, hyperlipidemia, prostate cancer, A. Fib on eliquis, known abdominal aortic aneurysm, coronary disease status post CABG that presents for another 6 month follow-up for surveillance of AAA.  Patient has had a known aneurysm since 2017 when this was discovered incidentally on a CT after a fall.  At the time it was about 4.4 cm.  This has been followed by surveillance.  Last duplex 6 months ago showed it was 5.27 cm.    No abdominal pain.  No new concerns today.  Daughter reports he had a CT scan with [facility] several weeks ago for surveillance given history of prostate cancer.  Report shows 5.3 cm aneurysm.   Past Medical History:  Diagnosis Date   AAA (abdominal aortic aneurysm) ([place]) followed by dr Bridgett Larsson   last duplex 06/28/2018 4.6 cm,  asymptomatic   Anticoagulant long-term use    eliquis   Benign localized prostatic hyperplasia with lower urinary tract symptoms (LUTS)    CAD (coronary artery disease) cardiologist-  dr Bronson Ing   remote CABG in 1997; prior PCI to the LAD i n1990; Nuclear study in February of 2012 normal with an EF of 60%    DDD (degenerative disc disease), cervical    Full dentures    History of radiation therapy 11/24/12-01/18/13   Prostate 78Gy/68f   HOH (hard of hearing)    both   Hyperlipidemia    Hyperlipidemia    Hypertension    Permanent atrial fibrillation ([facility] 09[place]2012   followed by dr kBronson Ing  Phimosis    severe   [place]    [place] eyes   Prostate cancer ([facility] urologist-- dr mTresa Moore  dx 09-02-2012-- Stage T2a, Gleason 3+4=7, PSA 8.12, vol 103 cc--- completed external beam radiation 02/ 2014;  recurrent 2015 , started hormone therapy   S/P CABG x 3 1997   Strains to urinate    Type 2 diabetes  mellitus [facility] with insulin ([facility]    endocrinologist-- dr kDwyane Dee   Past Surgical History:  Procedure Laterality Date   CARDIAC CATHETERIZATION  01-18-2004   dr tDoreatha Lew  patent grafts, ef 55%, minimal anterior hypokinesis,  severe total occlusion of LCFx and LAD,  severe disease of D2 and segmental RCA   CARDIOVASCULAR STRESS TEST  01/07/2011   normal nuclear study w/ no ischemia/  normal LV function and wall motion,  ef 60%   CARPAL TUNNEL RELEASE Right    CATARACT EXTRACTION W/ INTRAOCULAR LENS  IMPLANT, BILATERAL  left 2006;  right 2010   CIRCUMCISION N/A 07/28/2018   Procedure: CIRCUMCISION ADULT;  Surgeon: MAlexis Frock MD;  [facility]  Service: Urology;  Laterality: N/A;   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   CORONARY ANGIOPLASTY  1990   LAD   CORONARY ARTERY BYPASS GRAFT  11-1995   shows distal LAD disease, without recurrent symptoms of angina with questionable mild apical ischemia but with normal EF    ESOPHAGOGASTRODUODENOSCOPY (EGD) WITH ESOPHAGEAL DILATION  05/2012   KNEE ARTHROSCOPY Left    TRANSTHORACIC ECHOCARDIOGRAM  10/10/2012   ef 55%, akinesis of the inferolateral wall,  grade 2 diastolic dysfunction/  trivial AR/  mild MR and TR/  mild LAE/  mild RVSF reduced and mild dilated RV  Family History  Problem Relation Age of Onset   Stroke Father    Heart attack Mother    Heart disease Mother    Cancer Sister        lung   Cancer Brother        prostate   Diabetes Brother    Stroke Brother    Stroke Brother    Heart disease Brother    Heart disease Brother    Heart attack Sister    Heart disease Sister    Heart attack Sister    Cancer Sister        breast   Heart disease Sister    Heart attack Sister    Heart disease Sister    Heart disease Sister     SOCIAL HISTORY: Social History   Tobacco Use   Smoking status: Former    Years: 20.00    Types: Cigarettes    Quit date: 10/14/1979    Years since quitting: 43.0    Smokeless tobacco: Former    Types: Chew    Quit date: 11/23/2012  Substance Use Topics   Alcohol use: No    Allergies  Allergen Reactions   Cephalexin Nausea And Vomiting    Current Outpatient Medications  Medication Sig Dispense Refill   acetaminophen (TYLENOL) 500 MG tablet Take 1,300 mg by mouth at bedtime.     alfuzosin (UROXATRAL) 10 MG 24 hr tablet Take 10 mg by mouth at bedtime. Reported on 12/18/2015     amLODipine (NORVASC) 10 MG tablet TAKE 1 TABLET BY MOUTH EVERY DAY 90 tablet 1   apixaban (ELIQUIS) 5 MG TABS tablet Take 1 tablet (5 mg total) by mouth 2 (two) times daily. 180 tablet 1   atorvastatin (LIPITOR) 40 MG tablet Take 1 tablet (40 mg total) by mouth daily. 90 tablet 3   Blood Glucose Monitoring Suppl (FREESTYLE LITE) w/Device KIT 1 Device by Does not apply route 2 (two) times daily. 1 kit 0   Cinnamon 500 MG capsule Take 500 mg by mouth 2 (two) times daily.     finasteride (PROSCAR) 5 MG tablet Take 5 mg by mouth every morning. Reported on 12/18/2015     FREESTYLE LITE test strip USE TO TEST TWICE A DAY 100 strip 1   insulin isophane & regular human KwikPen (HUMULIN 70/30 KWIKPEN) (70-30) 100 UNIT/ML KwikPen Inject 20 Units into the skin in the morning and at bedtime. 30 mL 4   Insulin Pen Needle (PEN NEEDLES) 31G X 5 MM MISC 1 each by Does not apply route 2 (two) times daily. 180 each 2   lisinopril (ZESTRIL) 20 MG tablet Take 1 tablet (20 mg total) by mouth daily. 90 tablet 3   metFORMIN (GLUCOPHAGE-XR) 750 MG 24 hr tablet TAKE 2 TABLETS DAILY WITH BREAKFAST 180 tablet 3   Omega-3 Fatty Acids (OMEGA-3 FISH OIL PO) Take 1 capsule by mouth daily.     prednisoLONE acetate (PRED FORTE) 1 % ophthalmic suspension PLACE 1 DROP INTO THE RIGHT EYE DAILY.     traMADol (ULTRAM) 50 MG tablet Take 1 tablet (50 mg total) by mouth every 6 (six) hours as needed for moderate pain. Post-operatively 15 tablet 0   No current facility-administered medications for this visit.    REVIEW  OF SYSTEMS:  _0  denotes positive finding, _1  denotes negative finding Cardiac  Comments:  Chest pain or chest pressure:    Shortness of breath upon exertion:    Short of breath when lying flat:  Irregular heart rhythm:        Vascular    Pain in calf, thigh, or hip brought on by ambulation:    Pain in feet at night that wakes you up from your sleep:     Blood clot in your veins:    Leg swelling:         Pulmonary    Oxygen at home:    Productive cough:     Wheezing:         Neurologic    Sudden weakness in arms or legs:     Sudden numbness in arms or legs:     Sudden onset of difficulty speaking or slurred speech:    Temporary loss of vision in one eye:     Problems with dizziness:         Gastrointestinal    Blood in stool:     Vomited blood:         Genitourinary    Burning when urinating:     Blood in urine:        Psychiatric    Major depression:         Hematologic    Bleeding problems:    Problems with blood clotting too easily:        Skin    Rashes or ulcers:        Constitutional    Fever or chills:      PHYSICAL EXAM: Vitals:   10/06/22 0820  BP: (!) 159/83  Pulse: 77  Resp: 18  Temp: 98 F (36.7 C)  TempSrc: Temporal  SpO2: 98%  Weight: 199 lb (90.3 kg)  Height: 6' (1.829 m)     GENERAL: The patient is a well-nourished male, in no acute distress. The vital signs are documented above. CARDIAC: There is a regular rate and rhythm.  VASCULAR:  2+ palpable femoral pulse bilaterally Right PT palpable PULMONARY: No respiratory distress. ABDOMEN: Soft and non-tender.  No pain with palpation of aneurysm. MUSCULOSKELETAL: There are no major deformities or cyanosis. NEUROLOGIC: No focal weakness or paresthesias are detected. SKIN: There are no ulcers or rashes noted. PSYCHIATRIC: The patient has a normal affect.  DATA:   Reviewed his AAA duplex from today and aneurysm has increased from 5.27 cm ---> 5.36 cm over past 6 months  CT  abdomen/pelvis 09/23/22 shows a 5.3 cm AAA  Assessment/Plan:  88 year old male presents for interval 6 month follow-up of known abdominal aortic aneurysm.  His aneurysm has shown minimal growth over the past 6 months and measures 5.36 cm by AAA duplex today.  I reviewed his CT scan from his urologist for surveillance of his prostate cancer several weeks ago and this shows his aneurysm is 5.3 cm by my measurement.  He has no pain with palpation of the aneurysm.  Discussed that current guidelines are to repair these at greater than 5.5 cm in men unless there is rapid growth or it is felt to be causing symptoms.  I will plan to see him again in 6 months with AAA duplex here in the office.  Marty Heck, MD [facility] Office: [phone]

## 2022-10-09 ENCOUNTER — Other Ambulatory Visit: Payer: Self-pay

## 2022-10-09 DIAGNOSIS — I714 Abdominal aortic aneurysm, without rupture, unspecified: Secondary | ICD-10-CM

## 2022-10-20 ENCOUNTER — Other Ambulatory Visit: Payer: Self-pay | Admitting: Student

## 2022-11-02 DIAGNOSIS — E538 Deficiency of other specified B group vitamins: Secondary | ICD-10-CM | POA: Diagnosis not present

## 2022-11-09 ENCOUNTER — Other Ambulatory Visit: Payer: Self-pay

## 2022-11-09 NOTE — ED Triage Notes (Signed)
Pt was diagnosed with covid yesterday, was not started on any antiviral. [place] said that he has not been himself today, not as [place] as normal

## 2022-12-14 DIAGNOSIS — H18512 Endothelial corneal dystrophy, left eye: Secondary | ICD-10-CM | POA: Diagnosis not present

## 2022-12-14 DIAGNOSIS — Z947 Corneal transplant status: Secondary | ICD-10-CM | POA: Insufficient documentation

## 2022-12-14 DIAGNOSIS — Z961 Presence of intraocular lens: Secondary | ICD-10-CM | POA: Diagnosis not present

## 2022-12-14 DIAGNOSIS — E113513 Type 2 diabetes mellitus with proliferative diabetic retinopathy with macular edema, bilateral: Secondary | ICD-10-CM | POA: Diagnosis not present

## 2022-12-14 DIAGNOSIS — H353221 Exudative age-related macular degeneration, left eye, with active choroidal neovascularization: Secondary | ICD-10-CM | POA: Insufficient documentation

## 2022-12-15 ENCOUNTER — Other Ambulatory Visit: Payer: Self-pay | Admitting: Student

## 2022-12-15 DIAGNOSIS — I4821 Permanent atrial fibrillation: Secondary | ICD-10-CM

## 2022-12-15 NOTE — Telephone Encounter (Signed)
Eliquis '5mg'$  refill request received. Patient is 89 years old, weight-90.3.kg, Crea-0.98 on 08/26/2022 via [facility] from [facility], [place], and last seen by [place] on 06/26/2022. Dose is appropriate based on dosing criteria. Will send in refill to requested pharmacy.

## 2022-12-17 DIAGNOSIS — E538 Deficiency of other specified B group vitamins: Secondary | ICD-10-CM | POA: Diagnosis not present

## 2023-01-03 NOTE — Progress Notes (Unsigned)
Cardiology Office Note:    Date:  01/04/2023   ID:  Steve Bailey, DOB 10-16-1934, MRN [id number]  PCP:  [place], [place] Providers Cardiologist:  None     Referring MD: Josefa Half*   Chief Complaint:  Follow-up     History of Present Illness:   Steve Bailey is a 89 y.o. male with past medical history of CAD (s/p CABG in 1997, patent grafts by cath in 2005, low-risk NST in 2012 and 12/2020), permanent atrial fibrillation, HTN, HLD, IDDM and AAA (at 5.2 cm in 09/2021)   Patient was last seen by [name] 06/26/22 and doing well. He stopped his Toprol because of low HR's.   Patient comes in for f/u. Denies cardiac complaints. HR 70-80's at home. Denies chest pain, dyspnea, palpitations, edema.      Past Medical History:  Diagnosis Date   AAA (abdominal aortic aneurysm) ([place]) followed by dr Bridgett Larsson   last duplex 06/28/2018 4.6 cm,  asymptomatic   Anticoagulant long-term use    eliquis   Benign localized prostatic hyperplasia with lower urinary tract symptoms (LUTS)    CAD (coronary artery disease) cardiologist-  dr Bronson Ing   remote CABG in 1997; prior PCI to the LAD i n1990; Nuclear study in February of 2012 normal with an EF of 60%    DDD (degenerative disc disease), cervical    Full dentures    History of radiation therapy 11/24/12-01/18/13   Prostate 78Gy/72f   HOH (hard of hearing)    both   Hyperlipidemia    Hyperlipidemia    Hypertension    Permanent atrial fibrillation ([facility] 09/[place]   followed by dr kBronson Ing  Phimosis    severe   Proliferative diabetic retinopathy ([place]    both eyes   Prostate cancer ([facility] urologist-- dr mTresa Moore  dx 09-02-2012-- Stage T2a, Gleason 3+4=7, PSA 8.12, vol 103 cc--- completed external beam radiation 02/ 2014;  recurrent 2015 , started hormone therapy   S/P CABG x 3 1997   Strains to urinate    Type 2 diabetes mellitus treated with insulin ([facility]    endocrinologist-- dr  kDwyane Dee  Current Medications: Current Meds  Medication Sig   acetaminophen (TYLENOL) 500 MG tablet Take 1,300 mg by mouth at bedtime.   alfuzosin (UROXATRAL) 10 MG 24 hr tablet Take 10 mg by mouth at bedtime. Reported on 12/18/2015   amLODipine (NORVASC) 10 MG tablet TAKE 1 TABLET DAILY   apixaban (ELIQUIS) 5 MG TABS tablet TAKE 1 TABLET TWICE A DAY   atorvastatin (LIPITOR) 40 MG tablet Take 1 tablet (40 mg total) by mouth daily.   Blood Glucose Monitoring Suppl (FREESTYLE LITE) w/Device KIT 1 Device by Does not apply route 2 (two) times daily.   Cinnamon 500 MG capsule Take 500 mg by mouth 2 (two) times daily.   finasteride (PROSCAR) 5 MG tablet Take 5 mg by mouth every morning. Reported on 12/18/2015   FREESTYLE LITE test strip USE TO TEST TWICE A DAY   insulin isophane & regular human KwikPen (HUMULIN 70/30 KWIKPEN) (70-30) 100 UNIT/ML KwikPen Inject 20 Units into the skin in the morning and at bedtime.   Insulin Pen Needle (PEN NEEDLES) 31G X 5 MM MISC 1 each by Does not apply route 2 (two) times daily.   lisinopril (ZESTRIL) 20 MG tablet Take 1 tablet (20 mg total) by mouth daily.   metFORMIN (GLUCOPHAGE-XR) 750 MG 24 hr tablet TAKE 2 TABLETS  DAILY WITH BREAKFAST   Omega-3 Fatty Acids (OMEGA-3 FISH OIL PO) Take 1 capsule by mouth daily.   prednisoLONE acetate (PRED FORTE) 1 % ophthalmic suspension PLACE 1 DROP INTO THE RIGHT EYE DAILY.   traMADol (ULTRAM) 50 MG tablet Take 1 tablet (50 mg total) by mouth every 6 (six) hours as needed for moderate pain. Post-operatively    Allergies:   Cephalexin   Social History   Tobacco Use   Smoking status: Former    Years: 20.00    Types: Cigarettes    Quit date: 10/14/1979    Years since quitting: 43.2   Smokeless tobacco: Former    Types: Chew    Quit date: 11/23/2012  Vaping Use   Vaping Use: Never used  Substance Use Topics   Alcohol use: No   Drug use: No    Family Hx: The patient's family history includes Cancer in his brother,  sister, and sister; Diabetes in his brother; Heart attack in his mother, sister, sister, and sister; Heart disease in his brother, brother, mother, sister, sister, sister, and sister; Stroke in his brother, brother, and father.  ROS     Physical Exam:    VS:  BP 130/80   Pulse 72   Ht 6' (1.829 m)   Wt 199 lb (90.3 kg)   SpO2 99%   BMI 26.99 kg/m     Wt Readings from Last 3 Encounters:  01/04/23 199 lb (90.3 kg)  10/06/22 199 lb (90.3 kg)  09/02/22 197 lb (89.4 kg)    Physical Exam  GEN: Well nourished, well developed, in no acute distress  Neck: no JVD, carotid bruits, or masses Cardiac:irreg irreg; no murmurs, rubs, or gallops  Respiratory:  clear to auscultation bilaterally, normal work of breathing GI: soft, nontender, nondistended, + BS Ext: without cyanosis, clubbing, or edema, Good distal pulses bilaterally Neuro:  Alert and Oriented x 3,   Psych: euthymic mood, full affect        EKGs/Labs/Other Test Reviewed:    EKG:  EKG is  ordered today.   EKG Afib 72/m old inf MI LVH  Recent Labs: 01/28/2022: ALT 14; BUN 24; Creatinine, Ser 1.21; Potassium 4.1; Sodium 132 08/09/2022: Hemoglobin 12.0; Platelets 155   Recent Lipid Panel No results for input(s): "CHOL", "TRIG", "HDL", "VLDL", "LDLCALC", "LDLDIRECT" in the last 8760 hours.   Prior CV Studies:     NST: 12/2020 Blood pressure demonstrated a normal response to exercise. There was no ST segment deviation noted during stress.   Normal resting and stress perfusion. No ischemia or infarction EF 59% underlying rhythm afib      AAA Duplex: 03/2022 Summary:  Abdominal Aorta: There is evidence of abnormal dilatation of the mid and  distal Abdominal aorta. The largest aortic measurement is 5.27 cm. The  largest aortic diameter remains essentially unchanged compared to prior  exam. Previous diameter measurement  was 5.28 cm obtained on 10/07/2021.   Risk Assessment/Calculations/Metrics:    CHA2DS2-VASc Score  = 5   This indicates a 7.2% annual risk of stroke. The patient's score is based upon: CHF History: 0 HTN History: 1 Diabetes History: 1 Stroke History: 0 Vascular Disease History: 1 Age Score: 2 Gender Score: 0             ASSESSMENT & PLAN:   No problem-specific Assessment & Plan notes found for this encounter.    CAD - He is s/p CABG in 1997 with patent grafts by cath in 2005 and had a  low-risk NST in 12/2020.  He remains active at baseline for his age and denies any recent anginal symptoms. - Continue Atorvastatin 18m daily, no ASA on eliquis     Permanent Atrial Fibrillation - HR is well-controlled . No longer on AV nodal blocking agents.  - On Eliquis 539mBID to have blood work by Dr. KuEloy EndHave requested CMET, CBC and FLP    HTN - BP is well-controlled at home and on recheck here. Continue current medical therapy with Amlodipine 10 mg daily and Lisinopril 20 mg daily.     HLD - due for FLP in   Continue Atorvastatin 40 mg daily.   5. AAA - Followed by Vascular Surgery. This measured 5.3 cm for f/u in May.           Dispo:  No follow-ups on file.   Medication Adjustments/Labs and Tests Ordered: Current medicines are reviewed at length with the patient today.  Concerns regarding medicines are outlined above.  Tests Ordered: Orders Placed This Encounter  Procedures   EKG 12-Lead   Medication Changes: No orders of the defined types were placed in this encounter.  Signed, MiErmalinda BarriosPA-C  01/04/2023 12:20 PM    [email or web address]  [id number]: [phone]: [phone]

## 2023-01-04 ENCOUNTER — Ambulatory Visit: Payer: Medicare Other | Attending: Physician Assistant | Admitting: Physician Assistant

## 2023-01-04 ENCOUNTER — Encounter: Payer: Self-pay | Admitting: Physician Assistant

## 2023-01-04 VITALS — BP 130/80 | HR 72 | Ht 72.0 in | Wt 199.0 lb

## 2023-01-04 DIAGNOSIS — I25810 Atherosclerosis of coronary artery bypass graft(s) without angina pectoris: Secondary | ICD-10-CM | POA: Diagnosis not present

## 2023-01-04 DIAGNOSIS — E785 Hyperlipidemia, unspecified: Secondary | ICD-10-CM | POA: Diagnosis not present

## 2023-01-04 DIAGNOSIS — I4821 Permanent atrial fibrillation: Secondary | ICD-10-CM | POA: Diagnosis not present

## 2023-01-04 DIAGNOSIS — I10 Essential (primary) hypertension: Secondary | ICD-10-CM | POA: Diagnosis not present

## 2023-01-04 DIAGNOSIS — I714 Abdominal aortic aneurysm, without rupture, unspecified: Secondary | ICD-10-CM | POA: Insufficient documentation

## 2023-01-04 NOTE — Patient Instructions (Signed)
Medication Instructions:  Your physician recommends that you continue on your current medications as directed. Please refer to the Current Medication list given to you today.   Labwork: None today  Please have your endocrinologist fax [place] your lab results to [phone]  Testing/Procedures: None today  Follow-Up: 6 months with MD  Any Other Special Instructions Will Be Listed Below (If Applicable).  If you need a refill on your cardiac medications before your next appointment, please call your pharmacy.

## 2023-01-07 ENCOUNTER — Ambulatory Visit (INDEPENDENT_AMBULATORY_CARE_PROVIDER_SITE_OTHER): Payer: Medicare Other | Admitting: Endocrinology

## 2023-01-07 ENCOUNTER — Encounter: Payer: Self-pay | Admitting: Endocrinology

## 2023-01-07 VITALS — BP 146/80 | HR 81 | Ht 72.0 in | Wt 198.6 lb

## 2023-01-07 DIAGNOSIS — Z794 Long term (current) use of insulin: Secondary | ICD-10-CM | POA: Diagnosis not present

## 2023-01-07 DIAGNOSIS — I25810 Atherosclerosis of coronary artery bypass graft(s) without angina pectoris: Secondary | ICD-10-CM

## 2023-01-07 DIAGNOSIS — E1165 Type 2 diabetes mellitus with hyperglycemia: Secondary | ICD-10-CM | POA: Diagnosis not present

## 2023-01-07 DIAGNOSIS — D649 Anemia, unspecified: Secondary | ICD-10-CM

## 2023-01-07 DIAGNOSIS — E7800 Pure hypercholesterolemia, unspecified: Secondary | ICD-10-CM

## 2023-01-07 LAB — LIPID PANEL
Cholesterol: 150 mg/dL (ref 0–200)
HDL: 51.8 mg/dL (ref >39.00)
LDL Cholesterol: 78 mg/dL (ref 0–99)
NonHDL: 98.16
Total CHOL/HDL Ratio: 3
Triglycerides: 101 mg/dL (ref 0.0–149.0)
VLDL: 20.2 mg/dL (ref 0.0–40.0)

## 2023-01-07 LAB — POCT GLYCOSYLATED HEMOGLOBIN (HGB A1C): Hemoglobin A1C: 7.2 % — AB (ref 4.0–5.6)

## 2023-01-07 LAB — CBC
HCT: 35.9 % — ABNORMAL LOW (ref 39.0–52.0)
Hemoglobin: 12.4 g/dL — ABNORMAL LOW (ref 13.0–17.0)
MCHC: 34.4 g/dL (ref 30.0–36.0)
MCV: 97 fl (ref 78.0–100.0)
Platelets: 135 10*3/uL — ABNORMAL LOW (ref 150.0–400.0)
RBC: 3.7 Mil/uL — ABNORMAL LOW (ref 4.22–5.81)
RDW: 14.1 % (ref 11.5–15.5)
WBC: 5.3 10*3/uL (ref 4.0–10.5)

## 2023-01-07 LAB — COMPREHENSIVE METABOLIC PANEL
ALT: 12 U/L (ref 0–53)
AST: 14 U/L (ref 0–37)
Albumin: 4.1 g/dL (ref 3.5–5.2)
Alkaline Phosphatase: 71 U/L (ref 39–117)
BUN: 22 mg/dL (ref 6–23)
CO2: 26 mEq/L (ref 19–32)
Calcium: 9.6 mg/dL (ref 8.4–10.5)
Chloride: 102 mEq/L (ref 96–112)
Creatinine, Ser: 1.28 mg/dL (ref 0.40–1.50)
GFR: 50.61 mL/min — ABNORMAL LOW (ref >60.00)
Glucose, Bld: 194 mg/dL — ABNORMAL HIGH (ref 70–99)
Potassium: 4.9 mEq/L (ref 3.5–5.1)
Sodium: 136 mEq/L (ref 135–145)
Total Bilirubin: 0.8 mg/dL (ref 0.2–1.2)
Total Protein: 6.8 g/dL (ref 6.0–8.3)

## 2023-01-07 LAB — HEMOGLOBIN A1C: Hgb A1c MFr Bld: 7.2 % — ABNORMAL HIGH (ref 4.6–6.5)

## 2023-01-07 LAB — POCT GLUCOSE (DEVICE FOR HOME USE): Glucose Fasting, POC: 206 mg/dL — AB (ref 70–99)

## 2023-01-07 NOTE — Progress Notes (Signed)
Patient ID: Steve Bailey, male   DOB: October 31, 1934, 89 y.o.   MRN: [id number]    Reason for Appointment:  follow-up of diabetes and related problems  History of Present Illness    Diagnosis: Type 2 DIABETES MELITUS, date of diagnosis: 1995              History:     Insulin regimen: Humulin 70/30 KwikPen,  20 units  a.c. before breakfast and dinner  Oral hypoglycemic drugs: Metformin ER 750 mg, 2 tabs daily   His A1c is 7.2 compared to 6.7  Current management, blood sugar patterns and problems: He still does not want to check his blood sugars at home His daughter again thinks that he is periodically getting some sweets like cookies Also fasting blood sugars unusually high today at 206 in the office; this may be related to eating pasta last night and some cookies later on Recently has not done much physical activity like walking  However weight is about the same  Has taken his metformin regularly  Apparently trying to take his insulin before meals twice a day consistently  Glucometer: Freestyle lite, no readings available    Carbohydrate intake: at meals: Mostly with vegetables, also getting some lean meats like Chicken and pork Egg, bacon, grits or biscuits in am    Weight history:  Wt Readings from Last 3 Encounters:  01/07/23 198 lb 9.6 oz (90.1 kg)  01/04/23 199 lb (90.3 kg)  10/06/22 199 lb (90.3 kg)      Lab Results  Component Value Date   HGBA1C 7.2 (A) 01/07/2023   HGBA1C 6.7 (A) 09/02/2022   HGBA1C 7.1 (A) 04/30/2022   Lab Results  Component Value Date   MICROALBUR 10.0 (H) 01/28/2022   LDLCALC 66 09/30/2021   CREATININE 1.21 01/28/2022    Other active problems: See review of systems    Allergies as of 01/07/2023       Reactions   Cephalexin Nausea And Vomiting        Medication List        Accurate as of January 07, 2023  9:11 AM. If you have any questions, ask your nurse or doctor.          acetaminophen 500 MG  tablet Commonly known as: TYLENOL Take 1,300 mg by mouth at bedtime.   alfuzosin 10 MG 24 hr tablet Commonly known as: UROXATRAL Take 10 mg by mouth at bedtime. Reported on 12/18/2015   amLODipine 10 MG tablet Commonly known as: NORVASC TAKE 1 TABLET DAILY   atorvastatin 40 MG tablet Commonly known as: LIPITOR Take 1 tablet (40 mg total) by mouth daily.   Cinnamon 500 MG capsule Take 500 mg by mouth 2 (two) times daily.   Eliquis 5 MG Tabs tablet Generic drug: apixaban TAKE 1 TABLET TWICE A DAY   finasteride 5 MG tablet Commonly known as: PROSCAR Take 5 mg by mouth every morning. Reported on 12/18/2015   FREESTYLE LITE test strip Generic drug: glucose blood USE TO TEST TWICE A DAY   FreeStyle Lite w/Device Kit 1 Device by Does not apply route 2 (two) times daily.   HumuLIN 70/30 KwikPen (70-30) 100 UNIT/ML KwikPen Generic drug: insulin isophane & regular human KwikPen Inject 20 Units into the skin in the morning and at bedtime.   lisinopril 20 MG tablet Commonly known as: ZESTRIL Take 1 tablet (20 mg total) by mouth daily.   metFORMIN 750 MG 24 hr tablet Commonly known  as: GLUCOPHAGE-XR TAKE 2 TABLETS DAILY WITH BREAKFAST   OMEGA-3 FISH OIL PO Take 1 capsule by mouth daily.   Pen Needles 31G X 5 MM Misc 1 each by Does not apply route 2 (two) times daily.   prednisoLONE acetate 1 % ophthalmic suspension Commonly known as: PRED FORTE PLACE 1 DROP INTO THE RIGHT EYE DAILY.   traMADol 50 MG tablet Commonly known as: Ultram Take 1 tablet (50 mg total) by mouth every 6 (six) hours as needed for moderate pain. Post-operatively        Allergies:  Allergies  Allergen Reactions   Cephalexin Nausea And Vomiting    Past Medical History:  Diagnosis Date   AAA (abdominal aortic aneurysm) ([place]) followed by dr Bridgett Larsson   last duplex 06/28/2018 4.6 cm,  asymptomatic   Anticoagulant long-term use    eliquis   Benign localized prostatic hyperplasia with lower urinary  tract symptoms (LUTS)    CAD (coronary artery disease) cardiologist-  dr Bronson Ing   remote CABG in 1997; prior PCI to the LAD i n1990; Nuclear study in February of 2012 normal with an EF of 60%    DDD (degenerative disc disease), cervical    Full dentures    History of radiation therapy 11/24/12-01/18/13   Prostate 78Gy/38f   HOH (hard of hearing)    both   Hyperlipidemia    Hyperlipidemia    Hypertension    [place]ermanent atrial [place] ([facility] 09[place]   followed by dr kBronson Ing  Phimosis    severe   Proliferative diabetic retinopathy ([facility]    both eyes   Prostate [facility] ([facility] urologist-- dr mTresa Moore  dx 09-02-2012-- Stage T2a, Gleason 3+4=7, PSA 8.12, vol 103 cc--- completed external beam radiation 02/ 2014;  recurrent 2015 , started hormone therapy   S/P CABG x 3 1997   Strains to urinate    Type 2 diabetes mellitus treated with insulin ([facility]    endocrinologist-- dr kDwyane Dee   Past Surgical History:  Procedure Laterality Date   CARDIAC CATHETERIZATION  01-18-2004   dr tDoreatha Lew  patent grafts, ef 55%, minimal anterior hypokinesis,  severe total occlusion of LCFx and LAD,  severe disease of D2 and segmental RCA   CARDIOVASCULAR STRESS TEST  01/07/2011   normal nuclear study w/ no ischemia/  normal LV function and wall motion,  ef 60%   CARPAL TUNNEL RELEASE Right    CATARACT EXTRACTION W/ INTRAOCULAR LENS  IMPLANT, BILATERAL  left 2006;  right 2010   CIRCUMCISION N/A 07/28/2018   Procedure: CIRCUMCISION ADULT;  Surgeon: MAlexis Frock MD;  [facility]  Service: Urology;  Laterality: N/A;   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   CORONARY ANGIOPLASTY  1990   LAD   CORONARY ARTERY BYPASS GRAFT  11-1995   shows distal LAD disease, without recurrent symptoms of angina with questionable mild apical ischemia but with normal EF    ESOPHAGOGASTRODUODENOSCOPY (EGD) WITH ESOPHAGEAL DILATION  05/2012   KNEE ARTHROSCOPY Left    TRANSTHORACIC ECHOCARDIOGRAM   10/10/2012   ef 55%, akinesis of the inferolateral wall,  grade 2 diastolic dysfunction/  trivial AR/  mild MR and TR/  mild LAE/  mild RVSF reduced and mild dilated RV    Family History  Problem Relation Age of Onset   Stroke Father    Heart attack Mother    Heart disease Mother    Cancer Sister        lung   Cancer Brother  prostate   Diabetes Brother    Stroke Brother    Stroke Brother    Heart disease Brother    Heart disease Brother    Heart attack Sister    Heart disease Sister    Heart attack Sister    Cancer Sister        breast   Heart disease Sister    Heart attack Sister    Heart disease Sister    Heart disease Sister     Social History:  reports that he quit smoking about 43 years ago. His smoking use included cigarettes. He quit smokeless tobacco use about 10 years ago.  His smokeless tobacco use included chew. He reports that he does not drink alcohol and does not use drugs.  ROS   HYPERTENSION:   Has been present for several years.    He has been treated with lisinopril 20 mg, amlodipine 70m His amlodipine has been prescribed by cardiologist  He generally has higher readings in the office, recently seen by cardiologist  BP Readings from Last 3 Encounters:  01/07/23 (!) 146/80  01/04/23 130/80  11/09/22 (!) 118/57          Microalbumin normal in 3/23 Renal function usually normal  Lab Results  Component Value Date   CREATININE 1.21 01/28/2022   CREATININE 1.00 09/30/2021   CREATININE 1.04 01/24/2021    HYPERLIPIDEMIA: Hypercholesterolemia is well controlled with 40 mg Lipitor prescribed by his cardiologist labs done by PCP showed LDL of 69 Labs as follows:   Lab Results  Component Value Date   CHOL 132 09/30/2021   HDL 56.40 09/30/2021   [place]66 09/30/2021   TRIG 47.0 09/30/2021   CHOLHDL 2 09/30/2021    Diabetic foot exam done in 10/23 showed normal monofilament sensation and absent pulses on the left as before   He is  being followed by vascular surgeon for aortic aneurysm  [facility], has corneal dystrophy and macular edema as well as retinopathy     Examination:   BP (!) 146/80 (BP Location: Left Arm, Patient Position: Sitting, Cuff Size: Normal)   Pulse 81   Ht 6' (1.829 m)   Wt 198 lb 9.6 oz (90.1 kg)   SpO2 95%   BMI 26.94 kg/m   Body mass index is 26.94 kg/m.    Assesment/PLAN: 2/24  DIABETES, Type II on insulin:  See history of present illness for  discussion of current diabetes management and problems identified  His A1c is 7.2  He has been long-term on 20 units premixed insulin twice a day using the insulin pens, compliance appears to be fairly good Also on metformin ER 1500 mg  Blood sugars are inconsistent based on his diet and over 200 this morning fasting He is reluctant to do any fingerstick monitoring again He does agree to use the freestyle libre sensor which his daughter will help set up, this may allow him to any unusually high or low blood sugar patterns especially when he starts getting more active Prescription will be sent through [employer] website and his daughter will be trained on how to use it Discussed how this is worth The benefits of using it including alerts for high and low sugars   HYPERTENSION: Blood pressure is relatively high, and to continue follow-up with cardiology and PCP Also check regularly at home  Chronic anemia: Followed by PCP  Lipids: Labs to be rechecked today as requested by cardiology, usually has  excellent control with atorvastatin  Follow-up in 4 months   There are no Patient Instructions on file for this visit.   Elayne Snare 01/07/2023, 9:11 AM   Addendum: Labs normal except for mild anemia

## 2023-01-08 ENCOUNTER — Telehealth: Payer: Self-pay

## 2023-01-08 MED ORDER — EZETIMIBE 10 MG PO TABS: 10.0000 mg | ORAL_TABLET | Freq: Every day | ORAL | 3 refills | Status: DC

## 2023-01-08 NOTE — Telephone Encounter (Signed)
Spoke to patients daughter who verbalized understanding.

## 2023-01-08 NOTE — Telephone Encounter (Signed)
-----   Message from Imogene Burn, PA-C sent at 01/08/2023  8:38 AM EST ----- Thanks for sending your labs. LDL is above goal of less than 55. Please add Zetia 10 mg once daily and recheck FLP in 3 months thanks. Glucose needs better control.  ----- Message ----- From: Elayne Snare, MD Sent: 01/07/2023   3:35 PM EST To: Imogene Burn, PA-C

## 2023-01-18 DIAGNOSIS — E538 Deficiency of other specified B group vitamins: Secondary | ICD-10-CM | POA: Diagnosis not present

## 2023-02-03 ENCOUNTER — Encounter: Payer: Self-pay | Admitting: Endocrinology

## 2023-02-08 ENCOUNTER — Other Ambulatory Visit: Payer: Self-pay | Admitting: Student

## 2023-02-12 ENCOUNTER — Encounter: Payer: Medicare Other | Attending: Endocrinology | Admitting: Dietician

## 2023-02-12 DIAGNOSIS — E118 Type 2 diabetes mellitus with unspecified complications: Secondary | ICD-10-CM | POA: Insufficient documentation

## 2023-02-12 NOTE — Progress Notes (Signed)
Patient is here today with his daughter.  Freestyle Libre Personal CGM Training  Start [phone]    End time: 1215 Total time: [place] was educated about the following:  -Getting to know device    IT trainer) -Setting up device , trend arrows - sensor interaction with vitamin C: do not take more than 500 mg vitamin C daily -Inserting sensor (patient applied the sensor on his left upper back of arm  himself today with minimal assist. It appeared to be working and in warm up when he left the office) -Calibrating- none required. - using meter when test blood sugar symbol appears -Ending sensor session -Trouble shooting -Tape guide, ability to upload from home with email address (he says he nor wife do email)  -Reviewed insulin dosing from [place]  Patient has [place] tech support and my contact information.  Patient to call for questions.  Antonieta Iba, RD, LDN, CDCES

## 2023-02-16 ENCOUNTER — Telehealth: Payer: Self-pay

## 2023-02-16 NOTE — Telephone Encounter (Signed)
Patient feels that he needs to be on more insulin because his number have still been high on his CGM.

## 2023-02-17 NOTE — Telephone Encounter (Signed)
Patient scheduled for sooner appointment

## 2023-02-19 DIAGNOSIS — E538 Deficiency of other specified B group vitamins: Secondary | ICD-10-CM | POA: Diagnosis not present

## 2023-02-26 ENCOUNTER — Other Ambulatory Visit: Payer: Self-pay | Admitting: Endocrinology

## 2023-02-26 DIAGNOSIS — E1165 Type 2 diabetes mellitus with hyperglycemia: Secondary | ICD-10-CM

## 2023-03-11 ENCOUNTER — Other Ambulatory Visit: Payer: Medicare Other

## 2023-03-12 ENCOUNTER — Ambulatory Visit (INDEPENDENT_AMBULATORY_CARE_PROVIDER_SITE_OTHER): Payer: Medicare Other | Admitting: Endocrinology

## 2023-03-12 ENCOUNTER — Encounter: Payer: Self-pay | Admitting: Endocrinology

## 2023-03-12 VITALS — BP 122/82 | HR 87 | Ht 72.0 in | Wt 198.0 lb

## 2023-03-12 DIAGNOSIS — Z794 Long term (current) use of insulin: Secondary | ICD-10-CM | POA: Diagnosis not present

## 2023-03-12 DIAGNOSIS — E1165 Type 2 diabetes mellitus with hyperglycemia: Secondary | ICD-10-CM | POA: Diagnosis not present

## 2023-03-12 DIAGNOSIS — I10 Essential (primary) hypertension: Secondary | ICD-10-CM

## 2023-03-12 LAB — POCT GLYCOSYLATED HEMOGLOBIN (HGB A1C): Hemoglobin A1C: 6 % — AB (ref 4.0–5.6)

## 2023-03-12 MED ORDER — INSULIN LISPRO PROT & LISPRO (75-25 MIX) 100 UNIT/ML KWIKPEN: PEN_INJECTOR | SUBCUTANEOUS | 1 refills | Status: DC

## 2023-03-12 NOTE — Progress Notes (Signed)
Patient ID: Steve Bailey, male   DOB: 01/07/1934, 89 y.o.   MRN: [id number]    Reason for Appointment:  follow-up of diabetes and related problems  History of Present Illness    Diagnosis: Type 2 DIABETES MELITUS, date of diagnosis: 1995              History:     Insulin regimen: Humulin 70/30 KwikPen,  20 units  a.c. before breakfast and dinner  Oral hypoglycemic drugs: Metformin ER 750 mg, 2 tabs daily   His A1c is 6 compared to 7.2   Current management, blood sugar patterns and problems: He has started using the freestyle libre sensor lately He came in as a short-term follow-up because he was concerned his blood sugars were getting higher However recently he said that he is trying to do better with his diet to avoid high blood sugar episodes by cutting back on large amounts of carbohydrates and sweets He generally takes his insulin 15 to 30 minutes before meals as specified by his daughter However the most significant pattern on his CGM is tendency to some overnight hypoglycemia and 4% of readings below target He gets alerts with low blood sugars and will eat some kind of candy for this Also he states that the [name] is accurate compared to the fingersticks when the blood sugars are unusually high or low Especially this week his blood sugars have not been generally high at all He is doing well with his breakfast and getting an egg with his toast Weight is about the same Also regular with metformin  Interpretation of the download for the last 2 weeks on the libre 3 sensor as follows  Overnight blood sugars are generally low normal with occasional mild episodes of hypoglycemia sometimes relatively long length of time Hyperglycemic episodes have been noticed only sporadically after dinner or late at night but no more than 3 or 4 episodes in the last 2 weeks Premeal blood sugars are somewhat higher at dinnertime compared to lunchtime Overall postprandial readings are  not much higher than Premeal readings with highest average sugar between 6-8 PM Hypoglycemia as above  CGM use % of time 96  2-week average/GV 116/29  Time in range 91     %  % Time Above 180 5  % Time above 250   % Time Below 70 4     PRE-MEAL Fasting Lunch Dinner Bedtime Overall  Glucose range:       Averages: 89  123     POST-MEAL PC Breakfast PC Lunch PC Dinner  Glucose range:     Averages: 134 125 146    Carbohydrate intake: at meals: Mostly with vegetables, also getting some lean meats like Chicken and pork Egg, bacon, grits or biscuits in am    Weight history:  Wt Readings from Last 3 Encounters:  03/12/23 198 lb (89.8 kg)  01/07/23 198 lb 9.6 oz (90.1 kg)  01/04/23 199 lb (90.3 kg)      Lab Results  Component Value Date   HGBA1C 6.0 (A) 03/12/2023   HGBA1C 7.2 (H) 01/07/2023   HGBA1C 7.2 (A) 01/07/2023   Lab Results  Component Value Date   MICROALBUR 10.0 (H) 01/28/2022   LDLCALC 78 01/07/2023   CREATININE 1.28 01/07/2023    Other active problems: See review of systems    Allergies as of 03/12/2023       Reactions   Cephalexin Nausea And Vomiting  Medication List        Accurate as of March 12, 2023 11:10 AM. If you have any questions, ask your nurse or doctor.          STOP taking these medications    HumuLIN 70/30 KwikPen (70-30) 100 UNIT/ML KwikPen Generic drug: insulin isophane & regular human KwikPen Stopped by: Reather Littler, MD       TAKE these medications    acetaminophen 500 MG tablet Commonly known as: TYLENOL Take 1,300 mg by mouth at bedtime.   alfuzosin 10 MG 24 hr tablet Commonly known as: UROXATRAL Take 10 mg by mouth at bedtime. Reported on 12/18/2015   amLODipine 10 MG tablet Commonly known as: NORVASC TAKE 1 TABLET DAILY   atorvastatin 40 MG tablet Commonly known as: LIPITOR TAKE 1 TABLET DAILY   Cinnamon 500 MG capsule Take 500 mg by mouth 2 (two) times daily.   Eliquis 5 MG Tabs  tablet Generic drug: apixaban TAKE 1 TABLET TWICE A DAY   ezetimibe 10 MG tablet Commonly known as: ZETIA Take 1 tablet (10 mg total) by mouth daily.   finasteride 5 MG tablet Commonly known as: PROSCAR Take 5 mg by mouth every morning. Reported on 12/18/2015   FreeStyle Libre 3 Sensor Misc by Does not apply route.   FREESTYLE LITE test strip Generic drug: glucose blood USE TO TEST TWICE A DAY   FreeStyle Lite w/Device Kit 1 Device by Does not apply route 2 (two) times daily.   Insulin Lispro Prot & Lispro (75-25) 100 UNIT/ML Kwikpen Commonly known as: HumaLOG Mix 75/25 KwikPen 20 units bid Started by: Reather Littler, MD   lisinopril 20 MG tablet Commonly known as: ZESTRIL TAKE 1 TABLET DAILY   metFORMIN 750 MG 24 hr tablet Commonly known as: GLUCOPHAGE-XR TAKE 2 TABLETS DAILY WITH BREAKFAST   OMEGA-3 FISH OIL PO Take 1 capsule by mouth daily.   Pen Needles 31G X 5 MM Misc 1 each by Does not apply route 2 (two) times daily.   prednisoLONE acetate 1 % ophthalmic suspension Commonly known as: PRED FORTE PLACE 1 DROP INTO THE RIGHT EYE DAILY.   traMADol 50 MG tablet Commonly known as: Ultram Take 1 tablet (50 mg total) by mouth every 6 (six) hours as needed for moderate pain. Post-operatively        Allergies:  Allergies  Allergen Reactions   Cephalexin Nausea And Vomiting    Past Medical History:  Diagnosis Date   AAA (abdominal aortic aneurysm) followed by dr Imogene Burn   last duplex 06/28/2018 4.6 cm,  asymptomatic   Anticoagulant long-term use    eliquis   Benign localized prostatic hyperplasia with lower urinary tract symptoms (LUTS)    CAD (coronary artery disease) cardiologist-  dr Purvis Sheffield   remote CABG in 1997; prior PCI to the LAD i n1990; Nuclear study in February of 2012 normal with an EF of 60%    DDD (degenerative disc disease), cervical    Full dentures    History of radiation therapy 11/24/12-01/18/13   Prostate 78Gy/29fx   HOH (hard of hearing)     both   Hyperlipidemia    Hyperlipidemia    Hypertension    Permanent atrial fibrillation 09/2012   followed by dr Purvis Sheffield   Phimosis    severe   Proliferative diabetic retinopathy    both eyes   Prostate cancer urologist-- dr Berneice Heinrich   dx 09-02-2012-- Stage T2a, Gleason 3+4=7, PSA 8.12, vol 103 cc--- completed external beam radiation  02/ 2014;  recurrent 2015 , started hormone therapy   S/P CABG x 3 1997   Strains to urinate    Type 2 diabetes mellitus treated with insulin    endocrinologist-- dr Lucianne Muss    Past Surgical History:  Procedure Laterality Date   CARDIAC CATHETERIZATION  01-18-2004   dr Deborah Chalk   patent grafts, ef 55%, minimal anterior hypokinesis,  severe total occlusion of LCFx and LAD,  severe disease of D2 and segmental RCA   CARDIOVASCULAR STRESS TEST  01/07/2011   normal nuclear study w/ no ischemia/  normal LV function and wall motion,  ef 60%   CARPAL TUNNEL RELEASE Right    CATARACT EXTRACTION W/ INTRAOCULAR LENS  IMPLANT, BILATERAL  left 2006;  right 2010   CIRCUMCISION N/A 07/28/2018   Procedure: CIRCUMCISION ADULT;  Surgeon: Sebastian Ache, MD;  Location: [facility];  Service: Urology;  Laterality: N/A;   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   CORONARY ANGIOPLASTY  1990   LAD   CORONARY ARTERY BYPASS GRAFT  11-1995   shows distal LAD disease, without recurrent symptoms of angina with questionable mild apical ischemia but with normal EF    ESOPHAGOGASTRODUODENOSCOPY (EGD) WITH ESOPHAGEAL DILATION  05/2012   KNEE ARTHROSCOPY Left    TRANSTHORACIC ECHOCARDIOGRAM  10/10/2012   ef 55%, akinesis of the inferolateral wall,  grade 2 diastolic dysfunction/  trivial AR/  mild MR and TR/  mild LAE/  mild RVSF reduced and mild dilated RV    Family History  Problem Relation Age of Onset   Stroke Father    Heart attack Mother    Heart disease Mother    Cancer Sister        lung   Cancer Brother        prostate   Diabetes Brother    Stroke  Brother    Stroke Brother    Heart disease Brother    Heart disease Brother    Heart attack Sister    Heart disease Sister    Heart attack Sister    Cancer Sister        breast   Heart disease Sister    Heart attack Sister    Heart disease Sister    Heart disease Sister     Social History:  reports that he quit smoking about 43 years ago. His smoking use included cigarettes. He quit smokeless tobacco use about 10 years ago.  His smokeless tobacco use included chew. He reports that he does not drink alcohol and does not use drugs.  ROS   HYPERTENSION:   Has been present for several years.    He has been treated with lisinopril 20 mg, amlodipine  His amlodipine has been prescribed by cardiologist  He generally has higher readings in the office, recently seen by cardiologist  BP Readings from Last 3 Encounters:  03/12/23 122/82  01/07/23 (!) 146/80  01/04/23 130/80          Microalbumin normal in 3/23 Renal function usually normal  Lab Results  Component Value Date   CREATININE 1.28 01/07/2023   CREATININE 1.21 01/28/2022   CREATININE 1.00 09/30/2021    HYPERLIPIDEMIA: Hypercholesterolemia is well controlled with 40 mg Lipitor prescribed by his cardiologist labs done by PCP showed LDL of 69 Labs as follows:   Lab Results  Component Value Date   CHOL 150 01/07/2023   HDL 51.80 01/07/2023   LDLCALC 78 01/07/2023   TRIG 101.0 01/07/2023   CHOLHDL 3 01/07/2023  Diabetic foot exam done in 10/23 showed normal monofilament sensation and absent pulses on the left as before   He is being followed by vascular surgeon for aortic aneurysm  He is being seen regularly by [facility] ophthalmology, has corneal dystrophy and macular edema as well as retinopathy     Examination:   BP 122/82 (BP Location: Left Arm, Patient Position: Sitting, Cuff Size: Normal)   Pulse 87   Ht 6' (1.829 m)   Wt 198 lb (89.8 kg)   SpO2 97%   BMI 26.85 kg/m   Body mass index is  26.85 kg/m.    Assesment/PLAN: 2/24  DIABETES, Type II on insulin:  See history of present illness for  discussion of current diabetes management and problems identified  His A1c is 6% compared to 7.2  He has been long-term on 20 units premixed Humulin insulin twice a day using the insulin pens Previously had difficulty getting any assessment of his home blood sugar readings However with the libre 3 which he has been using regularly and utilizing it consistently he has much better blood sugar control  Surprisingly he has had significant hypoglycemia at times overnight without symptoms Also he states that the [place] is accurate compared to the fingersticks when the blood sugars are unusually high or low Trying to avoid hypoglycemia even though he is concerned about blood sugars rising at times after meals Explained to him that his blood sugars are still controlled if they are below 180 postprandially and his time in range is excellent Discussed sites of application of the [place]  Since he has difficulty getting adequate supplies for his insulin through the local pharmacy we will switch him to Humalog mix which appears to be having the same coverage as the Humulin and also indicate that he needs 3 boxes for 3 months In the meantime he will reduce his suppertime dose to 16 units and continue 20 units in the morning as before Avoid cutting back excessively on portions   HYPERTENSION: Blood pressure is better today  Follow-up in June as scheduled   Patient Instructions  Take 16 units at supper, 20 in am, 5-10 min before meals   Reather Littler 03/12/2023, 11:10 AM   Addendum: Labs normal except for mild anemia

## 2023-03-12 NOTE — Patient Instructions (Addendum)
Take 16 units at supper, 20 in am, 5-10 min before meals

## 2023-03-16 ENCOUNTER — Encounter: Payer: Self-pay | Admitting: Endocrinology

## 2023-03-17 ENCOUNTER — Telehealth: Payer: Self-pay | Admitting: Nutrition

## 2023-03-17 NOTE — Telephone Encounter (Signed)
Daughter said that someone in the office trained them on how to use the [place] and linked the readings from his phone to [facility].

## 2023-03-23 ENCOUNTER — Telehealth: Payer: Self-pay

## 2023-03-23 NOTE — Telephone Encounter (Signed)
Patient's daughter called to ask about insulin change. New rx sig states 20U BID, she remembered being told 20U QAM and 16U QPM.  Per chart LOV note indicates: Patient Instructions  Take 16 units at supper, 20 in am, 5-10 min before meals  Spoke with patient's daughter and explained dose change. She will monitor his nighttime readings and let us know if there is no change or if the readings are abnormal. Nothing further needed at this time.

## 2023-03-26 DIAGNOSIS — E538 Deficiency of other specified B group vitamins: Secondary | ICD-10-CM | POA: Diagnosis not present

## 2023-03-29 ENCOUNTER — Other Ambulatory Visit: Payer: Self-pay | Admitting: *Deleted

## 2023-03-29 DIAGNOSIS — C61 Malignant neoplasm of prostate: Secondary | ICD-10-CM | POA: Diagnosis not present

## 2023-03-29 DIAGNOSIS — R0781 Pleurodynia: Secondary | ICD-10-CM | POA: Diagnosis not present

## 2023-03-29 DIAGNOSIS — I714 Abdominal aortic aneurysm, without rupture, unspecified: Secondary | ICD-10-CM

## 2023-03-29 DIAGNOSIS — S20219A Contusion of unspecified front wall of thorax, initial encounter: Secondary | ICD-10-CM | POA: Diagnosis not present

## 2023-03-29 DIAGNOSIS — Z7901 Long term (current) use of anticoagulants: Secondary | ICD-10-CM | POA: Diagnosis not present

## 2023-04-05 DIAGNOSIS — C61 Malignant neoplasm of prostate: Secondary | ICD-10-CM | POA: Diagnosis not present

## 2023-04-05 DIAGNOSIS — N471 Phimosis: Secondary | ICD-10-CM | POA: Diagnosis not present

## 2023-04-06 ENCOUNTER — Encounter: Payer: Self-pay | Admitting: Vascular Surgery

## 2023-04-06 ENCOUNTER — Ambulatory Visit (HOSPITAL_COMMUNITY)
Admission: RE | Admit: 2023-04-06 | Discharge: 2023-04-06 | Disposition: A | Discharge status: Home / Self Care | Payer: Medicare Other | Source: Ambulatory Visit | Attending: Vascular Surgery | Admitting: Vascular Surgery

## 2023-04-06 ENCOUNTER — Ambulatory Visit (INDEPENDENT_AMBULATORY_CARE_PROVIDER_SITE_OTHER): Payer: Medicare Other | Admitting: Vascular Surgery

## 2023-04-06 VITALS — BP 166/82 | HR 77 | Temp 97.5°F | Resp 16 | Ht 72.0 in | Wt 192.0 lb

## 2023-04-06 DIAGNOSIS — I7143 Infrarenal abdominal aortic aneurysm, without rupture: Secondary | ICD-10-CM

## 2023-04-06 DIAGNOSIS — I714 Abdominal aortic aneurysm, without rupture, unspecified: Secondary | ICD-10-CM | POA: Diagnosis not present

## 2023-04-06 NOTE — Progress Notes (Signed)
Patient name: Steve Bailey MRN: [id number] DOB: Sep 30, 1934 Sex: male  REASON FOR VISIT: 6 month follow-up for known 5.3 cm AAA   HPI: Steve Bailey is a 89 y.o. male with history of diabetes, hypertension, hyperlipidemia, prostate cancer, A. Fib on eliquis, known abdominal aortic aneurysm, coronary disease status post CABG that presents for another 6 month follow-up for surveillance of AAA.  Patient has had a known aneurysm since 2017 when this was discovered incidentally on a CT after a fall.  At the time it was about 4.4 cm.  This has been followed by surveillance.  Last duplex 6 months ago showed it was 5.3 cm.    No new abdominal or back pain.  No new concerns today.  Did have another fall at home.  Past Medical History:  Diagnosis Date   AAA (abdominal aortic aneurysm) (HCC) followed by dr Imogene Burn   last duplex 06/28/2018 4.6 cm,  asymptomatic   Anticoagulant long-term use    eliquis   Benign localized prostatic hyperplasia with lower urinary tract symptoms (LUTS)    CAD (coronary artery disease) cardiologist-  dr Purvis Sheffield   remote CABG in 1997; prior PCI to the LAD i n1990; Nuclear study in February of 2012 normal with an EF of 60%    DDD (degenerative disc disease), cervical    Full dentures    History of radiation therapy 11/24/12-01/18/13   Prostate 78Gy/14fx   HOH (hard of hearing)    both   Hyperlipidemia    Hyperlipidemia    Hypertension    Permanent atrial fibrillation (HCC) 09/2012   followed by dr Purvis Sheffield   Phimosis    severe   Proliferative diabetic retinopathy (HCC)    both eyes   Prostate cancer [facility]) urologist-- dr Berneice Heinrich   dx 09-02-2012-- Stage T2a, Gleason 3+4=7, PSA 8.12, vol 103 cc--- completed external beam radiation 02/ 2014;  recurrent 2015 , started hormone therapy   S/P CABG x 3 1997   Strains to urinate    Type 2 diabetes mellitus treated with insulin [facility])    endocrinologist-- dr Lucianne Muss    Past Surgical History:  Procedure Laterality Date    CARDIAC CATHETERIZATION  01-18-2004   dr Deborah Chalk   patent grafts, ef 55%, minimal anterior hypokinesis,  severe total occlusion of LCFx and LAD,  severe disease of D2 and segmental RCA   CARDIOVASCULAR STRESS TEST  01/07/2011   normal nuclear study w/ no ischemia/  normal LV function and wall motion,  ef 60%   CARPAL TUNNEL RELEASE Right    CATARACT EXTRACTION W/ INTRAOCULAR LENS  IMPLANT, BILATERAL  left 2006;  right 2010   CIRCUMCISION N/A 07/28/2018   Procedure: CIRCUMCISION ADULT;  Surgeon: Sebastian Ache, MD;  Location: [facility];  Service: Urology;  Laterality: N/A;   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   CORONARY ANGIOPLASTY  1990   LAD   CORONARY ARTERY BYPASS GRAFT  11-1995   shows distal LAD disease, without recurrent symptoms of angina with questionable mild apical ischemia but with normal EF    ESOPHAGOGASTRODUODENOSCOPY (EGD) WITH ESOPHAGEAL DILATION  05/2012   KNEE ARTHROSCOPY Left    TRANSTHORACIC ECHOCARDIOGRAM  10/10/2012   ef 55%, akinesis of the inferolateral wall,  grade 2 diastolic dysfunction/  trivial AR/  mild MR and TR/  mild LAE/  mild RVSF reduced and mild dilated RV    Family History  Problem Relation Age of Onset   Stroke Father    Heart attack Mother  Heart disease Mother    Cancer Sister        lung   Cancer Brother        prostate   Diabetes Brother    Stroke Brother    Stroke Brother    Heart disease Brother    Heart disease Brother    Heart attack Sister    Heart disease Sister    Heart attack Sister    Cancer Sister        breast   Heart disease Sister    Heart attack Sister    Heart disease Sister    Heart disease Sister     SOCIAL HISTORY: Social History   Tobacco Use   Smoking status: Former    Years: 20    Types: Cigarettes    Quit date: 10/14/1979    Years since quitting: 43.5   Smokeless tobacco: Former    Types: Chew    Quit date: 11/23/2012  Substance Use Topics   Alcohol use: No    Allergies   Allergen Reactions   Cephalexin Nausea And Vomiting    Current Outpatient Medications  Medication Sig Dispense Refill   ezetimibe (ZETIA) 10 MG tablet Take 1 tablet (10 mg total) by mouth daily. 90 tablet 3   acetaminophen (TYLENOL) 500 MG tablet Take 1,300 mg by mouth at bedtime.     alfuzosin (UROXATRAL) 10 MG 24 hr tablet Take 10 mg by mouth at bedtime. Reported on 12/18/2015     amLODipine (NORVASC) 10 MG tablet TAKE 1 TABLET DAILY 90 tablet 3   apixaban (ELIQUIS) 5 MG TABS tablet TAKE 1 TABLET TWICE A DAY 180 tablet 1   atorvastatin (LIPITOR) 40 MG tablet TAKE 1 TABLET DAILY 90 tablet 3   Blood Glucose Monitoring Suppl (FREESTYLE LITE) w/Device KIT 1 Device by Does not apply route 2 (two) times daily. 1 kit 0   Cinnamon 500 MG capsule Take 500 mg by mouth 2 (two) times daily.     Continuous Glucose Sensor (FREESTYLE LIBRE 3 SENSOR) MISC by Does not apply route.     finasteride (PROSCAR) 5 MG tablet Take 5 mg by mouth every morning. Reported on 12/18/2015     FREESTYLE LITE test strip USE TO TEST TWICE A DAY 100 strip 1   Insulin Lispro Prot & Lispro (HUMALOG MIX 75/25 KWIKPEN) (75-25) 100 UNIT/ML Kwikpen 20 units bid 45 mL 1   Insulin Pen Needle (PEN NEEDLES) 31G X 5 MM MISC 1 each by Does not apply route 2 (two) times daily. 180 each 2   lisinopril (ZESTRIL) 20 MG tablet TAKE 1 TABLET DAILY 90 tablet 3   metFORMIN (GLUCOPHAGE-XR) 750 MG 24 hr tablet TAKE 2 TABLETS DAILY WITH BREAKFAST 180 tablet 3   Omega-3 Fatty Acids (OMEGA-3 FISH OIL PO) Take 1 capsule by mouth daily.     prednisoLONE acetate (PRED FORTE) 1 % ophthalmic suspension PLACE 1 DROP INTO THE RIGHT EYE DAILY.     traMADol (ULTRAM) 50 MG tablet Take 1 tablet (50 mg total) by mouth every 6 (six) hours as needed for moderate pain. Post-operatively 15 tablet 0   No current facility-administered medications for this visit.    REVIEW OF SYSTEMS:  [X]  denotes positive finding, [ ]  denotes negative finding Cardiac  Comments:   Chest pain or chest pressure:    Shortness of breath upon exertion:    Short of breath when lying flat:    Irregular heart rhythm:  Vascular    Pain in calf, thigh, or hip brought on by ambulation:    Pain in feet at night that wakes you up from your sleep:     Blood clot in your veins:    Leg swelling:         Pulmonary    Oxygen at home:    Productive cough:     Wheezing:         Neurologic    Sudden weakness in arms or legs:     Sudden numbness in arms or legs:     Sudden onset of difficulty speaking or slurred speech:    Temporary loss of vision in one eye:     Problems with dizziness:         Gastrointestinal    Blood in stool:     Vomited blood:         Genitourinary    Burning when urinating:     Blood in urine:        Psychiatric    Major depression:         Hematologic    Bleeding problems:    Problems with blood clotting too easily:        Skin    Rashes or ulcers:        Constitutional    Fever or chills:      PHYSICAL EXAM: There were no vitals filed for this visit.    GENERAL: The patient is a well-nourished male, in no acute distress. The vital signs are documented above. CARDIAC: There is a regular rate and rhythm.  VASCULAR:  2+ palpable femoral pulse bilaterally Right PT palpable PULMONARY: No respiratory distress. ABDOMEN: Soft and non-tender.  No pain with palpation of aneurysm. MUSCULOSKELETAL: There are no major deformities or cyanosis. NEUROLOGIC: No focal weakness or paresthesias are detected. SKIN: There are no ulcers or rashes noted. PSYCHIATRIC: The patient has a normal affect.  DATA:   Reviewed his AAA duplex from today and aneurysm has increased from 5.3 cm ---> 5.5 cm over past 6 months  CT abdomen/pelvis 09/23/22 shows a 5.3 cm AAA  Assessment/Plan:  89 year old male presents for interval 6 month follow-up of known abdominal aortic aneurysm.  Discussed that his AAA duplex today shows his aneurysm has increased  from 5.3 to 5.5 cm.  I discussed that the current recommendations are for repair greater than 5.5 cm in men given risk of rupture.  I have recommended getting an updated CTA abdomen pelvis to further evaluate his anatomy.  In reviewing old images, I think he would be a stent graft candidate for endovascular repair.  I discussed the current size of his aneurysm is about a 5 to 10% yearly rupture risk.  I discussed about 90% of patients do not survive aneurysm rupture.  Will get the CT ordered through my office and have him follow-up with me after this is complete.  Cephus Shelling, MD [facility] Office: [phone]

## 2023-04-08 ENCOUNTER — Other Ambulatory Visit: Payer: Self-pay

## 2023-04-08 DIAGNOSIS — I714 Abdominal aortic aneurysm, without rupture, unspecified: Secondary | ICD-10-CM

## 2023-04-09 ENCOUNTER — Other Ambulatory Visit: Payer: Self-pay

## 2023-04-09 MED ORDER — FREESTYLE LIBRE 3 SENSOR MISC: 1.0000 | 1 refills | Status: AC

## 2023-05-07 DIAGNOSIS — E538 Deficiency of other specified B group vitamins: Secondary | ICD-10-CM | POA: Diagnosis not present

## 2023-05-11 ENCOUNTER — Encounter (HOSPITAL_COMMUNITY): Payer: Self-pay | Admitting: Vascular Surgery

## 2023-05-11 ENCOUNTER — Ambulatory Visit: Payer: Medicare Other | Admitting: Endocrinology

## 2023-05-11 ENCOUNTER — Ambulatory Visit (INDEPENDENT_AMBULATORY_CARE_PROVIDER_SITE_OTHER): Payer: Medicare Other | Admitting: Endocrinology

## 2023-05-11 ENCOUNTER — Ambulatory Visit (HOSPITAL_COMMUNITY)
Admission: RE | Admit: 2023-05-11 | Discharge: 2023-05-11 | Disposition: A | Discharge status: Home / Self Care | Payer: Medicare Other | Source: Ambulatory Visit | Attending: Vascular Surgery | Admitting: Vascular Surgery

## 2023-05-11 ENCOUNTER — Other Ambulatory Visit: Payer: Medicare Other

## 2023-05-11 VITALS — BP 139/70 | HR 72 | Ht 70.75 in | Wt 194.6 lb

## 2023-05-11 DIAGNOSIS — I714 Abdominal aortic aneurysm, without rupture, unspecified: Secondary | ICD-10-CM | POA: Diagnosis not present

## 2023-05-11 DIAGNOSIS — E1165 Type 2 diabetes mellitus with hyperglycemia: Secondary | ICD-10-CM | POA: Diagnosis not present

## 2023-05-11 DIAGNOSIS — Z794 Long term (current) use of insulin: Secondary | ICD-10-CM | POA: Diagnosis not present

## 2023-05-11 DIAGNOSIS — K5730 Diverticulosis of large intestine without perforation or abscess without bleeding: Secondary | ICD-10-CM | POA: Diagnosis not present

## 2023-05-11 DIAGNOSIS — I7143 Infrarenal abdominal aortic aneurysm, without rupture: Secondary | ICD-10-CM | POA: Diagnosis not present

## 2023-05-11 LAB — MICROALBUMIN / CREATININE URINE RATIO
Creatinine,U: 103.4 mg/dL
Microalb Creat Ratio: 4.2 mg/g (ref 0.0–30.0)
Microalb, Ur: 4.4 mg/dL — ABNORMAL HIGH (ref 0.0–1.9)

## 2023-05-11 LAB — POCT I-STAT CREATININE: Creatinine, Ser: 1.2 mg/dL (ref 0.61–1.24)

## 2023-05-11 MED ORDER — IOHEXOL 350 MG/ML SOLN
100.0000 mL | Freq: Once | INTRAVENOUS | Status: AC | PRN
  Administered 2023-05-11: 100 mL via INTRAVENOUS

## 2023-05-11 NOTE — Progress Notes (Signed)
Patient ID: Steve Bailey, male   DOB: 01-Jan-1934, 89 y.o.   MRN: [id number]    Reason for Appointment:  follow-up of diabetes and related problems  History of Present Illness    Diagnosis: Type 2 DIABETES MELITUS, date of diagnosis: 1995              History:     Insulin regimen: Humulin 75/25 KwikPen,  20 units  a.c. before breakfast and 16 dinner  Oral hypoglycemic drugs: Metformin ER 750 mg, 2 tabs daily   His A1c is 6 on the last visit, compared to 7.2   Current management, blood sugar patterns and problems: He has continued using the freestyle libre sensor  Because of tendency to low normal readings overnight his evening dose was reduced to 16 units instead of 20 However he still appears to be getting periodic low sugars overnight, more often than before His daughter said that he is eating normally smaller portions and sometimes not eating well at lunch He may be over concerned about his blood sugars going high and likely cutting back With this he has lost weight He is in the last week occasionally leaving off his evening metformin which keeps his blood sugars from dropping as low but it still went low last night with taking metformin Also at times his blood sugars are low normal or slightly low midday based on his diet 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 As before he is usually eating a full breakfast with proteins He is taking his insulin consistently and right before meals as directed  Interpretation of the download for the last 2 weeks on the libre 3 sensor as follows  Overnight blood sugars are frequently getting low for a few hours every at night  Blood sugars during the day are quite variable with periodically increased blood sugars after breakfast or dinner Otherwise he has sporadic tendency to low sugars in the early afternoons or evenings Postprandial readings: Usually rising only modestly after breakfast with occasional readings over 180 Blood  sugars are relatively flat after lunch overall Blood sugars after dinner are only rising slightly on an average but has at least 2 or 3 readings going over 180 Hypoglycemia mostly seen overnight in the first part of the night and sporadically during the day as above   CGM use % of time 96  2-week average/GV 115/29  Time in range    89    %  % Time Above 180 4  % Time above 250   % Time Below 70 7     PRE-MEAL Fasting Lunch Dinner Bedtime Overall  Glucose range:       Averages: 93       POST-MEAL PC Breakfast PC Lunch PC Dinner  Glucose range:     Averages: 141 124 140   Previously:  CGM use % of time 96  2-week average/GV 116/29  Time in range 91     %  % Time Above 180 5  % Time above 250   % Time Below 70 4     PRE-MEAL Fasting Lunch Dinner Bedtime Overall  Glucose range:       Averages: 89  123     POST-MEAL PC Breakfast PC Lunch PC Dinner  Glucose range:     Averages: 134 125 146    Carbohydrate intake: at meals: Mostly with vegetables, also getting some lean meats like Chicken and pork Egg, bacon, grits or biscuits  in am    Weight history:  Wt Readings from Last 3 Encounters:  05/11/23 194 lb 9.6 oz (88.3 kg)  04/06/23 192 lb (87.1 kg)  03/12/23 198 lb (89.8 kg)      Lab Results  Component Value Date   HGBA1C 6.0 (A) 03/12/2023   HGBA1C 7.2 (H) 01/07/2023   HGBA1C 7.2 (A) 01/07/2023   Lab Results  Component Value Date   MICROALBUR 10.0 (H) 01/28/2022   LDLCALC 78 01/07/2023   CREATININE 1.20 05/11/2023    Other active problems: See review of systems    Allergies as of 05/11/2023       Reactions   Cephalexin Nausea And Vomiting        Medication List        Accurate as of May 11, 2023  1:50 PM. If you have any questions, ask your nurse or doctor.          STOP taking these medications    traMADol 50 MG tablet Commonly known as: Ultram Stopped by: Reather Littler, MD       TAKE these medications    acetaminophen 500 MG  tablet Commonly known as: TYLENOL Take 1,300 mg by mouth at bedtime.   alfuzosin 10 MG 24 hr tablet Commonly known as: UROXATRAL Take 10 mg by mouth at bedtime. Reported on 12/18/2015   amLODipine 10 MG tablet Commonly known as: NORVASC TAKE 1 TABLET DAILY   atorvastatin 40 MG tablet Commonly known as: LIPITOR TAKE 1 TABLET DAILY   Cinnamon 500 MG capsule Take 500 mg by mouth 2 (two) times daily.   Eliquis 5 MG Tabs tablet Generic drug: apixaban TAKE 1 TABLET TWICE A DAY   ezetimibe 10 MG tablet Commonly known as: ZETIA Take 1 tablet (10 mg total) by mouth daily.   finasteride 5 MG tablet Commonly known as: PROSCAR Take 5 mg by mouth every morning. Reported on 12/18/2015   FreeStyle Libre 3 Sensor Misc 1 each by Does not apply route every 14 (fourteen) days.   FREESTYLE LITE test strip Generic drug: glucose blood USE TO TEST TWICE A DAY   FreeStyle Lite w/Device Kit 1 Device by Does not apply route 2 (two) times daily.   Insulin Lispro Prot & Lispro (75-25) 100 UNIT/ML Kwikpen Commonly known as: HumaLOG Mix 75/25 KwikPen 20 units bid   lisinopril 20 MG tablet Commonly known as: ZESTRIL TAKE 1 TABLET DAILY   metFORMIN 750 MG 24 hr tablet Commonly known as: GLUCOPHAGE-XR TAKE 2 TABLETS DAILY WITH BREAKFAST   OMEGA-3 FISH OIL PO Take 1 capsule by mouth daily.   Pen Needles 31G X 5 MM Misc 1 each by Does not apply route 2 (two) times daily.   prednisoLONE acetate 1 % ophthalmic suspension Commonly known as: PRED FORTE PLACE 1 DROP INTO THE RIGHT EYE DAILY.        Allergies:  Allergies  Allergen Reactions   Cephalexin Nausea And Vomiting    Past Medical History:  Diagnosis Date   AAA (abdominal aortic aneurysm) (HCC) followed by dr Imogene Burn   last duplex 06/28/2018 4.6 cm,  asymptomatic   Anticoagulant long-term use    eliquis   Benign localized prostatic hyperplasia with lower urinary tract symptoms (LUTS)    CAD (coronary artery disease)  cardiologist-  dr Purvis Sheffield   remote CABG in 1997; prior PCI to the LAD i n1990; Nuclear study in February of 2012 normal with an EF of 60%    DDD (degenerative disc disease), cervical  Full dentures    History of radiation therapy 11/24/12-01/18/13   Prostate 78Gy/17fx   HOH (hard of hearing)    both   Hyperlipidemia    Hyperlipidemia    Hypertension    Permanent atrial fibrillation (HCC) 09/2012   followed by dr Purvis Sheffield   Phimosis    severe   Proliferative diabetic retinopathy (HCC)    both eyes   Prostate cancer [facility]) urologist-- dr Berneice Heinrich   dx 09-02-2012-- Stage T2a, Gleason 3+4=7, PSA 8.12, vol 103 cc--- completed external beam radiation 02/ 2014;  recurrent 2015 , started hormone therapy   S/P CABG x 3 1997   Strains to urinate    Type 2 diabetes mellitus treated with insulin [facility])    endocrinologist-- dr Lucianne Muss    Past Surgical History:  Procedure Laterality Date   CARDIAC CATHETERIZATION  01-18-2004   dr Deborah Chalk   patent grafts, ef 55%, minimal anterior hypokinesis,  severe total occlusion of LCFx and LAD,  severe disease of D2 and segmental RCA   CARDIOVASCULAR STRESS TEST  01/07/2011   normal nuclear study w/ no ischemia/  normal LV function and wall motion,  ef 60%   CARPAL TUNNEL RELEASE Right    CATARACT EXTRACTION W/ INTRAOCULAR LENS  IMPLANT, BILATERAL  left 2006;  right 2010   CIRCUMCISION N/A 07/28/2018   Procedure: CIRCUMCISION ADULT;  Surgeon: Sebastian Ache, MD;  Location: [facility];  Service: Urology;  Laterality: N/A;   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   CORONARY ANGIOPLASTY  1990   LAD   CORONARY ARTERY BYPASS GRAFT  11-1995   shows distal LAD disease, without recurrent symptoms of angina with questionable mild apical ischemia but with normal EF    ESOPHAGOGASTRODUODENOSCOPY (EGD) WITH ESOPHAGEAL DILATION  05/2012   KNEE ARTHROSCOPY Left    TRANSTHORACIC ECHOCARDIOGRAM  10/10/2012   ef 55%, akinesis of the inferolateral wall,  grade  2 diastolic dysfunction/  trivial AR/  mild MR and TR/  mild LAE/  mild RVSF reduced and mild dilated RV    Family History  Problem Relation Age of Onset   Stroke Father    Heart attack Mother    Heart disease Mother    Cancer Sister        lung   Cancer Brother        prostate   Diabetes Brother    Stroke Brother    Stroke Brother    Heart disease Brother    Heart disease Brother    Heart attack Sister    Heart disease Sister    Heart attack Sister    Cancer Sister        breast   Heart disease Sister    Heart attack Sister    Heart disease Sister    Heart disease Sister     Social History:  reports that he quit smoking about 43 years ago. His smoking use included cigarettes. He quit smokeless tobacco use about 10 years ago.  His smokeless tobacco use included chew. He reports that he does not drink alcohol and does not use drugs.  ROS   HYPERTENSION:   Has been present for several years.    He has been treated with lisinopril 20 mg, amlodipine 10mg  His medications have been prescribed by cardiologist   BP Readings from Last 3 Encounters:  05/11/23 139/70  04/06/23 (!) 166/82  03/12/23 122/82          Microalbumin normal in 3/23 Renal function close to normal  Lab Results  Component Value Date   CREATININE 1.20 05/11/2023   CREATININE 1.28 01/07/2023   CREATININE 1.21 01/28/2022    HYPERLIPIDEMIA: Hypercholesterolemia is well controlled with 40 mg Lipitor prescribed by his cardiologist labs done by PCP showed LDL of 69 Labs as follows:   Lab Results  Component Value Date   CHOL 150 01/07/2023   HDL 51.80 01/07/2023   LDLCALC 78 01/07/2023   TRIG 101.0 01/07/2023   CHOLHDL 3 01/07/2023    Diabetic foot exam done in 10/23 showed normal monofilament sensation and absent pulses on the left as before  He is being followed by vascular surgeon for aortic aneurysm  He is being seen regularly by [facility] ophthalmology, has corneal dystrophy and  macular edema as well as retinopathy     Examination:   BP 139/70 (BP Location: Left Arm, Patient Position: Sitting)   Pulse 72   Ht 5' 10.75" (1.797 m)   Wt 194 lb 9.6 oz (88.3 kg)   SpO2 97%   BMI 27.33 kg/m   Body mass index is 27.33 kg/m.    Assesment/PLAN: 2/24  DIABETES, Type II on insulin:  See history of present illness for  discussion of current diabetes management and problems identified  His A1c is 6% last  He has been long-term on 20 units Humalog mix insulin in the morning and 16 in the evening along with metformin  As above he has lost weight from cutting back on his intake and overall with reducing portions he is getting more insulin sensitive despite his abdominal obesity Most of his blood sugars have been low overnight  Recommendations: Avoid cutting back excessively on portions and discussed that blood sugars of 280 or not significantly high for him He will need to make sure he is eating a full lunch usually Also have a snack if more physically active during the day Reduce insulin by 2 units Stop metformin in the evening   HYPERTENSION: Blood pressure is controlled  Check urine microalbumin  Patient Instructions  Stop Metformin at dinner  Insulin 18 in am and 14 at supper   Reather Littler 05/11/2023, 1:50 PM

## 2023-05-11 NOTE — Patient Instructions (Signed)
Stop Metformin at dinner  Insulin 18 in am and 14 at supper

## 2023-05-13 ENCOUNTER — Encounter: Payer: Self-pay | Admitting: Endocrinology

## 2023-05-18 ENCOUNTER — Ambulatory Visit (INDEPENDENT_AMBULATORY_CARE_PROVIDER_SITE_OTHER): Payer: Medicare Other | Admitting: Vascular Surgery

## 2023-05-18 VITALS — BP 176/81 | HR 70 | Temp 97.6°F | Wt 192.0 lb

## 2023-05-18 DIAGNOSIS — I7143 Infrarenal abdominal aortic aneurysm, without rupture: Secondary | ICD-10-CM

## 2023-05-18 NOTE — Progress Notes (Signed)
Patient name: Steve Bailey MRN: [id number] DOB: [birth year] Sex: male  REASON FOR VISIT: F/U after CTA for suspected 5.5 cm AAA  HPI: Steve Bailey is a 89 y.o. male with history of diabetes, hypertension, hyperlipidemia, prostate cancer, A. Fib on eliquis, known abdominal aortic aneurysm, coronary disease status post CABG that presents for follow-up after CTA for further evaluation of his AAA.  Patient has had a known aneurysm since 2017 when this was discovered incidentally on a CT after a fall.  At the time it was about 4.4 cm.  This has been followed by surveillance.  Last duplex showed 5.5 cm AAA and he was sent for CT.    No new abdominal or back pain.  No new concerns today.  Did have another fall at home.  Past Medical History:  Diagnosis Date   AAA (abdominal aortic aneurysm) (HCC) followed by dr Imogene Burn   last duplex 06/28/2018 4.6 cm,  asymptomatic   Anticoagulant long-term use    eliquis   Benign localized prostatic hyperplasia with lower urinary tract symptoms (LUTS)    CAD (coronary artery disease) cardiologist-  dr Purvis Sheffield   remote CABG in 1997; prior PCI to the LAD i n1990; Nuclear study in February of 2012 normal with an EF of 60%    DDD (degenerative disc disease), cervical    Full dentures    History of radiation therapy 11/24/12-01/18/13   Prostate 78Gy/52fx   HOH (hard of hearing)    both   Hyperlipidemia    Hyperlipidemia    Hypertension    Permanent atrial fibrillation (HCC) 09/2012   followed by dr Purvis Sheffield   Phimosis    severe   Proliferative diabetic retinopathy (HCC)    both eyes   Prostate cancer [facility]) urologist-- dr Berneice Heinrich   dx 09-02-2012-- Stage T2a, Gleason 3+4=7, PSA 8.12, vol 103 cc--- completed external beam radiation 02/ 2014;  recurrent 2015 , started hormone therapy   S/P CABG x 3 1997   Strains to urinate    Type 2 diabetes mellitus treated with insulin [facility])    endocrinologist-- dr Lucianne Muss    Past Surgical History:  Procedure Laterality  Date   CARDIAC CATHETERIZATION  01-18-2004   dr Deborah Chalk   patent grafts, ef 55%, minimal anterior hypokinesis,  severe total occlusion of LCFx and LAD,  severe disease of D2 and segmental RCA   CARDIOVASCULAR STRESS TEST  01/07/2011   normal nuclear study w/ no ischemia/  normal LV function and wall motion,  ef 60%   CARPAL TUNNEL RELEASE Right    CATARACT EXTRACTION W/ INTRAOCULAR LENS  IMPLANT, BILATERAL  left 2006;  right 2010   CIRCUMCISION N/A 07/28/2018   Procedure: CIRCUMCISION ADULT;  Surgeon: Sebastian Ache, MD;  [facility];  Service: Urology;  Laterality: N/A;   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   CORONARY ANGIOPLASTY  1990   LAD   CORONARY ARTERY BYPASS GRAFT  11-1995   shows distal LAD disease, without recurrent symptoms of angina with questionable mild apical ischemia but with normal EF    ESOPHAGOGASTRODUODENOSCOPY (EGD) WITH ESOPHAGEAL DILATION  05/2012   KNEE ARTHROSCOPY Left    TRANSTHORACIC ECHOCARDIOGRAM  10/10/2012   ef 55%, akinesis of the inferolateral wall,  grade 2 diastolic dysfunction/  trivial AR/  mild MR and TR/  mild LAE/  mild RVSF reduced and mild dilated RV    Family History  Problem Relation Age of Onset   Stroke Father    Heart  attack Mother    Heart disease Mother    Cancer Sister        lung   Cancer Brother        prostate   Diabetes Brother    Stroke Brother    Stroke Brother    Heart disease Brother    Heart disease Brother    Heart attack Sister    Heart disease Sister    Heart attack Sister    Cancer Sister        breast   Heart disease Sister    Heart attack Sister    Heart disease Sister    Heart disease Sister     SOCIAL HISTORY: Social History   Tobacco Use   Smoking status: Former    Years: 20    Types: Cigarettes    Quit date: 10/14/1979    Years since quitting: 43.6   Smokeless tobacco: Former    Types: Chew    Quit date: 11/23/2012  Substance Use Topics   Alcohol use: No    Allergies   Allergen Reactions   Cephalexin Nausea And Vomiting    Current Outpatient Medications  Medication Sig Dispense Refill   acetaminophen (TYLENOL) 500 MG tablet Take 1,300 mg by mouth at bedtime.     alfuzosin (UROXATRAL) 10 MG 24 hr tablet Take 10 mg by mouth at bedtime. Reported on 12/18/2015     amLODipine (NORVASC) 10 MG tablet TAKE 1 TABLET DAILY 90 tablet 3   apixaban (ELIQUIS) 5 MG TABS tablet TAKE 1 TABLET TWICE A DAY 180 tablet 1   atorvastatin (LIPITOR) 40 MG tablet TAKE 1 TABLET DAILY 90 tablet 3   Blood Glucose Monitoring Suppl (FREESTYLE LITE) w/Device KIT 1 Device by Does not apply route 2 (two) times daily. 1 kit 0   Cinnamon 500 MG capsule Take 500 mg by mouth 2 (two) times daily.     Continuous Glucose Sensor (FREESTYLE LIBRE 3 SENSOR) MISC 1 each by Does not apply route every 14 (fourteen) days. 6 each 1   ezetimibe (ZETIA) 10 MG tablet Take 1 tablet (10 mg total) by mouth daily. 90 tablet 3   finasteride (PROSCAR) 5 MG tablet Take 5 mg by mouth every morning. Reported on 12/18/2015     FREESTYLE LITE test strip USE TO TEST TWICE A DAY 100 strip 1   Insulin Lispro Prot & Lispro (HUMALOG MIX 75/25 KWIKPEN) (75-25) 100 UNIT/ML Kwikpen 20 units bid 45 mL 1   Insulin Pen Needle (PEN NEEDLES) 31G X 5 MM MISC 1 each by Does not apply route 2 (two) times daily. 180 each 2   lisinopril (ZESTRIL) 20 MG tablet TAKE 1 TABLET DAILY 90 tablet 3   metFORMIN (GLUCOPHAGE-XR) 750 MG 24 hr tablet TAKE 2 TABLETS DAILY WITH BREAKFAST 180 tablet 3   Omega-3 Fatty Acids (OMEGA-3 FISH OIL PO) Take 1 capsule by mouth daily.     prednisoLONE acetate (PRED FORTE) 1 % ophthalmic suspension PLACE 1 DROP INTO THE RIGHT EYE DAILY.     No current facility-administered medications for this visit.    REVIEW OF SYSTEMS:  [X]  denotes positive finding, [ ]  denotes negative finding Cardiac  Comments:  Chest pain or chest pressure:    Shortness of breath upon exertion:    Short of breath when lying flat:     Irregular heart rhythm:        Vascular    Pain in calf, thigh, or hip brought on by ambulation:  Pain in feet at night that wakes you up from your sleep:     Blood clot in your veins:    Leg swelling:         Pulmonary    Oxygen at home:    Productive cough:     Wheezing:         Neurologic    Sudden weakness in arms or legs:     Sudden numbness in arms or legs:     Sudden onset of difficulty speaking or slurred speech:    Temporary loss of vision in one eye:     Problems with dizziness:         Gastrointestinal    Blood in stool:     Vomited blood:         Genitourinary    Burning when urinating:     Blood in urine:        Psychiatric    Major depression:         Hematologic    Bleeding problems:    Problems with blood clotting too easily:        Skin    Rashes or ulcers:        Constitutional    Fever or chills:      PHYSICAL EXAM: Vitals:   05/18/23 0919 05/18/23 0922  BP: (!) 180/78 (!) 176/81  Pulse: 70   Temp: 97.6 F (36.4 C)   TempSrc: Temporal   SpO2: 97%   Weight: 192 lb (87.1 kg)       GENERAL: The patient is a well-nourished male, in no acute distress. The vital signs are documented above. CARDIAC: There is a regular rate and rhythm.  VASCULAR:  2+ palpable femoral pulse bilaterally PULMONARY: No respiratory distress. ABDOMEN: Soft and non-tender.  No pain with palpation of aneurysm. MUSCULOSKELETAL: There are no major deformities or cyanosis. NEUROLOGIC: No focal weakness or paresthesias are detected. SKIN: There are no ulcers or rashes noted. PSYCHIATRIC: The patient has a normal affect.  DATA:   CTA 05/11/23: 5.0 cm AAA  Reviewed his AAA duplex from 04/06/23 and aneurysm has increased from 5.3 cm ---> 5.5 cm over past 6 months   Assessment/Plan:  89 year old male presents for follow-up after CTA for evaluation of a suspected 5.5 cm abdominal aortic aneurysm.  I discussed that his CT scan shows his aneurysm is smaller than  the duplex suggested measuring 5 cm.  Again discussed guidelines are to repair these at greater than 5.5 cm.  He wishes to delay intervention and I did briefly discuss fixing it at 5 cm if he wished to proceed.  I will see him again in 6 months with AAA duplex.  Cephus Shelling, MD [facility] Office: [phone]

## 2023-05-19 DIAGNOSIS — L820 Inflamed seborrheic keratosis: Secondary | ICD-10-CM | POA: Diagnosis not present

## 2023-05-26 ENCOUNTER — Other Ambulatory Visit: Payer: Self-pay

## 2023-05-26 DIAGNOSIS — I7143 Infrarenal abdominal aortic aneurysm, without rupture: Secondary | ICD-10-CM

## 2023-06-14 ENCOUNTER — Other Ambulatory Visit: Payer: Self-pay

## 2023-06-14 MED ORDER — FREESTYLE LITE TEST VI STRP: ORAL_STRIP | 2 refills | Status: AC

## 2023-06-15 DIAGNOSIS — E538 Deficiency of other specified B group vitamins: Secondary | ICD-10-CM | POA: Diagnosis not present

## 2023-06-16 ENCOUNTER — Encounter: Payer: Self-pay | Admitting: Endocrinology

## 2023-07-07 ENCOUNTER — Ambulatory Visit: Payer: Medicare Other | Attending: Student | Admitting: Student

## 2023-07-07 ENCOUNTER — Encounter: Payer: Self-pay | Admitting: Student

## 2023-07-07 VITALS — BP 126/62 | HR 63 | Ht 72.0 in | Wt 189.8 lb

## 2023-07-07 DIAGNOSIS — I4821 Permanent atrial fibrillation: Secondary | ICD-10-CM

## 2023-07-07 DIAGNOSIS — E785 Hyperlipidemia, unspecified: Secondary | ICD-10-CM

## 2023-07-07 DIAGNOSIS — I25810 Atherosclerosis of coronary artery bypass graft(s) without angina pectoris: Secondary | ICD-10-CM | POA: Diagnosis not present

## 2023-07-07 DIAGNOSIS — I10 Essential (primary) hypertension: Secondary | ICD-10-CM

## 2023-07-07 DIAGNOSIS — I714 Abdominal aortic aneurysm, without rupture, unspecified: Secondary | ICD-10-CM

## 2023-07-07 MED ORDER — NITROGLYCERIN 0.4 MG SL SUBL: 0.4000 mg | SUBLINGUAL_TABLET | SUBLINGUAL | 3 refills | Status: AC | PRN

## 2023-07-07 NOTE — Progress Notes (Signed)
Cardiology Office Note    Date:  07/07/2023  ID:  Steve Bailey, DOB 03-Mar-1934, MRN [id number] Cardiologist: Previously Steve Bailey  History of Present Illness:    Steve Bailey is a 89 y.o. male with past medical history of CAD (s/p CABG in 1997, patent grafts by cath in 2005, low-risk NST in 2012 and 12/2020), permanent atrial fibrillation, HTN, HLD, IDDM and AAA (at 5.4 cm by duplex in 03/2023 but 5.0 cm by CTA in 04/2023) who presents to the office today for 17-month follow-up.  He was last examined by Steve Reedy, PA in 12/2022 and denied any recent anginal symptoms. No changes were made to his cardiac medications and he was continued on Amlodipine 10 mg daily, Eliquis 5 mg twice daily, Atorvastatin 40 mg daily and Lisinopril 20 mg daily.  In talking with the patient and his daughter today, they report things have overall been stable since his last office visit. He does have baseline dyspnea on exertion if walking up long inclines such as when walking [place] from [place] but says this has been stable with no acute changes. No recent exertional chest pain or palpitations. No specific orthopnea, PND or pitting edema. Remains active at baseline for his age and still enjoys working with his elderberry plants and using his tractor. He does check his heart rate at home and it has been well-controlled with no recent tachycardia. He remains on Eliquis for anticoagulation with no reports of melena, hematochezia or hematuria.  Studies Reviewed:   EKG: EKG is not ordered today.  NST: 12/2020   Blood pressure demonstrated a normal response to exercise. There was no ST segment deviation noted during stress.   Normal resting and stress perfusion. No ischemia or infarction EF 59% underlying rhythm afib    AAA: 03/2023 Abdominal Aorta Findings:  +-----------+-------+----------+----------+--------+--------+--------+  Location  AP (cm)Trans (cm)PSV (cm/s)WaveformThrombusComments   +-----------+-------+----------+----------+--------+--------+--------+  Proximal                   34                                  +-----------+-------+----------+----------+--------+--------+--------+  Mid       2.55   2.42      71                                  +-----------+-------+----------+----------+--------+--------+--------+  Distal    5.48   5.28                                          +-----------+-------+----------+----------+--------+--------+--------+  RT CIA Prox1.2    1.3       141                                 +-----------+-------+----------+----------+--------+--------+--------+  LT CIA Prox1.1    1.3       141                                 +-----------+-------+----------+----------+--------+--------+--------+      Summary:  Abdominal Aorta: There is evidence of abnormal dilatation of the distal  Abdominal  aorta. See above for comparison.    *See table(s) above for measurements and observations.   CTA Abdomen: 04/2023 IMPRESSION: VASCULAR   1. Interval increase in size of infrarenal abdominal aortic aneurysm now measuring 5.0 x 4.8 cm, previously, 4.8 x 4.5 cm (when compared to the 08/2019 examination. No evidence of abdominal aortic dissection or perivascular stranding. If intervention is not pursued, recommend follow-up CTA of the abdomen and pelvis in 3-6 months as deemed appropriate by referring vascular surgeon. This recommendation follows ACR consensus guidelines: White Paper of the ACR Incidental Findings Committee II on Vascular Findings. J Am Coll Radiol 2013; 10:789-794. Aortic aneurysm NOS (ICD10-I71.9). 2.  Aortic Atherosclerosis (ICD10-I70.0).  Risk Assessment/Calculations:    CHA2DS2-VASc Score = 5   This indicates a 7.2% annual risk of stroke. The patient's score is based upon: CHF History: 0 HTN History: 1 Diabetes History: 1 Stroke History: 0 Vascular Disease History: 1 Age Score:  2 Gender Score: 0   Physical Exam:   VS:  BP 126/62   Pulse 63   Ht 6' (1.829 m)   Wt 189 lb 12.8 oz (86.1 kg)   SpO2 98%   BMI 25.74 kg/m    Wt Readings from Last 3 Encounters:  07/07/23 189 lb 12.8 oz (86.1 kg)  05/18/23 192 lb (87.1 kg)  05/11/23 194 lb 9.6 oz (88.3 kg)     GEN: Pleasant, elderly male appearing in no acute distress NECK: No JVD; No carotid bruits CARDIAC: Irregular irregular, no murmurs, rubs, gallops RESPIRATORY:  Clear to auscultation without rales, wheezing or rhonchi  ABDOMEN: Appears non-distended. No obvious abdominal masses. EXTREMITIES: No clubbing or cyanosis. No pitting edema.  Distal pedal pulses are 2+ bilaterally.   Assessment and Plan:   1. CAD - He underwent CABG in 1997 and most recent ischemic evaluation was a low-risk NST in 12/2020. He remains active at baseline for his age and denies any recent anginal symptoms. - Continue current medical therapy with Atorvastatin 40 mg daily, Zetia 10 mg daily and Lisinopril 20 mg daily. Will send in an updated Rx for SL NTG. He is no longer on ASA given the need for anticoagulation and is no longer on beta-blocker therapy due to bradycardia.  2. Permanent Atrial Fibrillation - Given his baseline HR in the 50's to 60's, he is no longer on AV nodal blocking agents. - He remains on Eliquis 5 mg twice daily for anticoagulation which is the appropriate dose at this time given his age, weight and renal function. Creatinine was at 1.20 when checked in 04/2023.  3. AAA - Followed by Vascular Surgery. This did measure 5.4 cm by duplex in 03/2023 but 5.0 cm by CTA in 04/2023. Vascular is planning for repeat imaging in 6 months.   4. HTN - BP is well-controlled at 126/62 during today's visit. Continue current medical therapy with Amlodipine 10 mg daily and Lisinopril 20 mg daily.  5. HLD - FLP in 12/2022 showed total cholesterol 150, triglycerides 101, HDL 51 and LDL 78.  He was started on Zetia 10 mg daily  while being continued on Atorvastatin 40 mg daily. Will recheck an FLP.   Signed, Ellsworth Lennox, PA-C

## 2023-07-07 NOTE — Patient Instructions (Signed)
Medication Instructions:   Continue current medications.   *If you need a refill on your cardiac medications before your next appointment, please call your pharmacy*   Lab Work:  Lipid Panel  If you have labs (blood work) drawn today and your tests are completely normal, you will receive your results only by: MyChart Message (if you have MyChart) OR A paper copy in the mail If you have any lab test that is abnormal or we need to change your treatment, we will call you to review the results.    Follow-Up: At [facility], you and your health needs are our priority.  As part of our continuing mission to provide you with exceptional heart care, we have created designated Provider Care Teams.  These Care Teams include your primary Cardiologist (physician) and Advanced Practice Providers (APPs -  Physician Assistants and Nurse Practitioners) who all work together to provide you with the care you need, when you need it.  We recommend signing up for the patient portal called "MyChart".  Sign up information is provided on this After Visit Summary.  MyChart is used to connect with patients for Virtual Visits (Telemedicine).  Patients are able to view lab/test results, encounter notes, upcoming appointments, etc.  Non-urgent messages can be sent to your provider as well.   To learn more about what you can do with MyChart, go to [email or web address].    Your next appointment:   6 month(s)  Provider:   [name], PA

## 2023-07-10 DIAGNOSIS — S82002A Unspecified fracture of left patella, initial encounter for closed fracture: Secondary | ICD-10-CM | POA: Diagnosis not present

## 2023-07-12 ENCOUNTER — Ambulatory Visit: Payer: Medicare Other | Admitting: Endocrinology

## 2023-07-16 DIAGNOSIS — S82002D Unspecified fracture of left patella, subsequent encounter for closed fracture with routine healing: Secondary | ICD-10-CM | POA: Diagnosis not present

## 2023-08-03 DIAGNOSIS — D61818 Other pancytopenia: Secondary | ICD-10-CM | POA: Diagnosis not present

## 2023-08-03 DIAGNOSIS — S82092A Other fracture of left patella, initial encounter for closed fracture: Secondary | ICD-10-CM | POA: Diagnosis not present

## 2023-08-03 DIAGNOSIS — D509 Iron deficiency anemia, unspecified: Secondary | ICD-10-CM | POA: Diagnosis not present

## 2023-08-03 DIAGNOSIS — E113513 Type 2 diabetes mellitus with proliferative diabetic retinopathy with macular edema, bilateral: Secondary | ICD-10-CM | POA: Diagnosis not present

## 2023-08-03 DIAGNOSIS — Z0000 Encounter for general adult medical examination without abnormal findings: Secondary | ICD-10-CM | POA: Diagnosis not present

## 2023-08-03 DIAGNOSIS — H9193 Unspecified hearing loss, bilateral: Secondary | ICD-10-CM | POA: Diagnosis not present

## 2023-08-03 DIAGNOSIS — Z794 Long term (current) use of insulin: Secondary | ICD-10-CM | POA: Diagnosis not present

## 2023-08-03 DIAGNOSIS — I4891 Unspecified atrial fibrillation: Secondary | ICD-10-CM | POA: Diagnosis not present

## 2023-08-03 DIAGNOSIS — E538 Deficiency of other specified B group vitamins: Secondary | ICD-10-CM | POA: Diagnosis not present

## 2023-08-03 DIAGNOSIS — I714 Abdominal aortic aneurysm, without rupture, unspecified: Secondary | ICD-10-CM | POA: Diagnosis not present

## 2023-08-03 DIAGNOSIS — Z8546 Personal history of malignant neoplasm of prostate: Secondary | ICD-10-CM | POA: Diagnosis not present

## 2023-08-03 DIAGNOSIS — I152 Hypertension secondary to endocrine disorders: Secondary | ICD-10-CM | POA: Diagnosis not present

## 2023-08-03 DIAGNOSIS — E785 Hyperlipidemia, unspecified: Secondary | ICD-10-CM | POA: Diagnosis not present

## 2023-08-11 ENCOUNTER — Telehealth: Payer: Self-pay | Admitting: Student

## 2023-08-11 DIAGNOSIS — I4821 Permanent atrial fibrillation: Secondary | ICD-10-CM

## 2023-08-11 NOTE — Telephone Encounter (Signed)
*  STAT* If patient is at the pharmacy, call can be transferred to refill team.   1. Which medications need to be refilled? (please list name of each medication and dose if known) apixaban (ELIQUIS) 5 MG TABS tablet  2. Which pharmacy/location (including street and city if local pharmacy) is medication to be sent to? [id number] [place], [place] - [address]  3. Do they need a 30 day or 90 day supply? 90 day

## 2023-08-12 MED ORDER — APIXABAN 5 MG PO TABS: 5.0000 mg | ORAL_TABLET | Freq: Two times a day (BID) | ORAL | 1 refills | Status: DC

## 2023-08-12 NOTE — Telephone Encounter (Signed)
Pt last saw [place] on 07/07/23, last labs 05/11/23 Creat 1.20, age 89, weight 86.1kg, based on specified criteria pt is on appropriate dosage of Eliquis 5mg  BID for afib.  Will refill rx.

## 2023-08-13 DIAGNOSIS — S82002D Unspecified fracture of left patella, subsequent encounter for closed fracture with routine healing: Secondary | ICD-10-CM | POA: Diagnosis not present

## 2023-08-17 DIAGNOSIS — E538 Deficiency of other specified B group vitamins: Secondary | ICD-10-CM | POA: Diagnosis not present

## 2023-08-17 DIAGNOSIS — E785 Hyperlipidemia, unspecified: Secondary | ICD-10-CM | POA: Diagnosis not present

## 2023-08-17 DIAGNOSIS — D61818 Other pancytopenia: Secondary | ICD-10-CM | POA: Diagnosis not present

## 2023-08-17 DIAGNOSIS — D509 Iron deficiency anemia, unspecified: Secondary | ICD-10-CM | POA: Diagnosis not present

## 2023-09-15 DIAGNOSIS — E538 Deficiency of other specified B group vitamins: Secondary | ICD-10-CM | POA: Diagnosis not present

## 2023-09-20 ENCOUNTER — Encounter: Payer: Self-pay | Admitting: Endocrinology

## 2023-09-20 ENCOUNTER — Ambulatory Visit (INDEPENDENT_AMBULATORY_CARE_PROVIDER_SITE_OTHER): Payer: Medicare Other | Admitting: Endocrinology

## 2023-09-20 VITALS — BP 122/60 | HR 79 | Resp 20 | Ht 72.0 in | Wt 194.2 lb

## 2023-09-20 DIAGNOSIS — E1169 Type 2 diabetes mellitus with other specified complication: Secondary | ICD-10-CM

## 2023-09-20 DIAGNOSIS — E160 Drug-induced hypoglycemia without coma: Secondary | ICD-10-CM | POA: Diagnosis not present

## 2023-09-20 DIAGNOSIS — Z794 Long term (current) use of insulin: Secondary | ICD-10-CM

## 2023-09-20 DIAGNOSIS — E113593 Type 2 diabetes mellitus with proliferative diabetic retinopathy without macular edema, bilateral: Secondary | ICD-10-CM | POA: Diagnosis not present

## 2023-09-20 DIAGNOSIS — T383X5A Adverse effect of insulin and oral hypoglycemic [antidiabetic] drugs, initial encounter: Secondary | ICD-10-CM

## 2023-09-20 LAB — POCT GLYCOSYLATED HEMOGLOBIN (HGB A1C): Hemoglobin A1C: 5.6 % (ref 4.0–5.6)

## 2023-09-20 MED ORDER — METFORMIN HCL ER 750 MG PO TB24: 750.0000 mg | ORAL_TABLET | Freq: Two times a day (BID) | ORAL | 3 refills | Status: DC

## 2023-09-20 MED ORDER — GVOKE HYPOPEN 1-PACK 1 MG/0.2ML ~~LOC~~ SOAJ: 1.0000 mg | SUBCUTANEOUS | 2 refills | Status: AC | PRN

## 2023-09-20 MED ORDER — INSULIN LISPRO PROT & LISPRO (75-25 MIX) 100 UNIT/ML KWIKPEN: PEN_INJECTOR | SUBCUTANEOUS | 4 refills | Status: DC

## 2023-09-20 NOTE — Progress Notes (Signed)
Outpatient Endocrinology Note Steve Reannon Candella, MD  09/20/23  Patient's Name: Steve Bailey    DOB: [birth year]    MRN: [id number]                                                    REASON OF VISIT: Follow up of type 2 diabetes mellitus  PCP: [facility]  HISTORY OF PRESENT ILLNESS:        Steve Bailey is a 89 y.o. old male with past medical history listed below, is here for follow up for type 2 diabetes mellitus.   Pertinent Diabetes History: Patient was diagnosed with type 2 diabetes mellitus in 1995.  He is on insulin therapy and has controlled type 2 diabetes mellitus.  Chronic Diabetes Complications : Retinopathy: yes, proliferative diabetic retinopathy bilateral eyes. Following with ophthalmology regularly, [facility]  He has corneal dystrophy and macular edema as well as retinopathy. Nephropathy: no, on ACE/ARB /lisinopril. Peripheral neuropathy: yes, numbness.  Coronary artery disease: yes, s/p CABG in 1997.  Stroke: no  Relevant comorbidities and cardiovascular risk factors: Obesity: no Body mass index is 26.34 kg/m.  Hypertension: Yes  Hyperlipidemia : Yes, on statin   Current / Home Diabetic regimen includes: Humalog mix 75/25 KwikPen 18 units before breakfast and 14 units with supper. Metformin extended release 750 mg 2 tablets daily.  Patient has been taking 1 tablet 2 times a day.  Prior diabetic medications:  Glycemic data:    CONTINUOUS GLUCOSE MONITORING SYSTEM (CGMS) INTERPRETATION: At today's visit, we reviewed CGM downloads. The full report is scanned in the media. Reviewing the CGM trends, blood glucose are as follows:  FreeStyle Libre 3 CGM-  Sensor Download (Sensor download was reviewed and summarized below.) Dates: October 15 to September 20, 2023, 14 days. Sensor Average: 114  Glucose Management Indicator: 6% Glucose Variability: 32%  % data captured: 95  Glycemic Trends:  <54: 1% 54-70: 8% 71-180:  86% 181-250: 4% 251-400: 1%   Interpretation: -Frequent hypoglycemia especially at night and early morning with blood sugar in 50-60 range and occasionally in between the meals with blood sugar in 60s.  Some of the days acceptable blood sugar throughout the day.  No significant hyperglycemia.  Hypoglycemia: Patient has frequent hypoglycemic episodes. Patient has hypoglycemia awareness.  Factors modifying glucose control: 1.  Diabetic diet assessment: 3 meals a day.  2.  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  3.  Medication compliance: compliant all of the time.  Interval history  CGM data as reviewed above.  Insulin regimen as reviewed above.  Patient is having frequent hypoglycemia as noted above.  Patient lives by himself however lives close to his daughter's home.  He has complaints of mild numbness of the feet otherwise no complaints today.  Patient is accompanied by daughter in the clinic today.  REVIEW OF SYSTEMS As per history of present illness.   PAST MEDICAL HISTORY: Past Medical History:  Diagnosis Date   AAA (abdominal aortic aneurysm) (HCC) followed by dr Imogene Burn   last duplex 06/28/2018 4.6 cm,  asymptomatic   Anticoagulant long-term use    eliquis   Benign localized prostatic hyperplasia with lower urinary tract symptoms (LUTS)    CAD (coronary artery disease) cardiologist-  dr Purvis Sheffield   remote CABG in 1997; prior PCI to  the LAD i n1990; Nuclear study in February of 2012 normal with an EF of 60%    DDD (degenerative disc disease), cervical    Full dentures    History of radiation therapy 11/24/12-01/18/13   Prostate 78Gy/50fx   HOH (hard of hearing)    both   Hyperlipidemia    Hyperlipidemia    Hypertension    Permanent atrial fibrillation (HCC) 09/2012   followed by dr Purvis Sheffield   Phimosis    severe   Proliferative diabetic retinopathy (HCC)    both eyes   Prostate cancer [facility]) urologist-- dr Berneice Heinrich   dx 09-02-2012-- Stage T2a, Gleason  3+4=7, PSA 8.12, vol 103 cc--- completed external beam radiation 02/ 2014;  recurrent 2015 , started hormone therapy   S/P CABG x 3 1997   Strains to urinate    Type 2 diabetes mellitus treated with insulin [facility])    endocrinologist-- dr Lucianne Muss    PAST SURGICAL HISTORY: Past Surgical History:  Procedure Laterality Date   CARDIAC CATHETERIZATION  01-18-2004   dr Deborah Chalk   patent grafts, ef 55%, minimal anterior hypokinesis,  severe total occlusion of LCFx and LAD,  severe disease of D2 and segmental RCA   CARDIOVASCULAR STRESS TEST  01/07/2011   normal nuclear study w/ no ischemia/  normal LV function and wall motion,  ef 60%   CARPAL TUNNEL RELEASE Right    CATARACT EXTRACTION W/ INTRAOCULAR LENS  IMPLANT, BILATERAL  left 2006;  right 2010   CIRCUMCISION N/A 07/28/2018   Procedure: CIRCUMCISION ADULT;  Surgeon: Sebastian Ache, MD;  Location: [facility];  Service: Urology;  Laterality: N/A;   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   CORONARY ANGIOPLASTY  1990   LAD   CORONARY ARTERY BYPASS GRAFT  11-1995   shows distal LAD disease, without recurrent symptoms of angina with questionable mild apical ischemia but with normal EF    ESOPHAGOGASTRODUODENOSCOPY (EGD) WITH ESOPHAGEAL DILATION  05/2012   KNEE ARTHROSCOPY Left    TRANSTHORACIC ECHOCARDIOGRAM  10/10/2012   ef 55%, akinesis of the inferolateral wall,  grade 2 diastolic dysfunction/  trivial AR/  mild MR and TR/  mild LAE/  mild RVSF reduced and mild dilated RV    ALLERGIES: Allergies  Allergen Reactions   Cephalexin Nausea And Vomiting    FAMILY HISTORY:  Family History  Problem Relation Age of Onset   Stroke Father    Heart attack Mother    Heart disease Mother    Cancer Sister        lung   Cancer Brother        prostate   Diabetes Brother    Stroke Brother    Stroke Brother    Heart disease Brother    Heart disease Brother    Heart attack Sister    Heart disease Sister    Heart attack Sister     Cancer Sister        breast   Heart disease Sister    Heart attack Sister    Heart disease Sister    Heart disease Sister     SOCIAL HISTORY: Social History   Socioeconomic History   Marital status: Widowed    Spouse name: Not on file   Number of children: Not on file   Years of education: Not on file   Highest education level: Not on file  Occupational History   Not on file  Tobacco Use   Smoking status: Former    Current packs/day: 0.00  Types: Cigarettes    Start date: 10/14/1959    Quit date: 10/14/1979    Years since quitting: 43.9   Smokeless tobacco: Former    Types: Chew    Quit date: 11/23/2012  Vaping Use   Vaping status: Never Used  Substance and Sexual Activity   Alcohol use: No   Drug use: No   Sexual activity: Not on file  Other Topics Concern   Not on file  Social History Narrative   Not on file   Social Determinants of Health   Financial Resource Strain: Not on file  Food Insecurity: Low Risk  (08/03/2023)   Received from [facility]   Hunger Vital Sign    Worried About Running Out of Food in the Last Year: Never true    Ran Out of Food in the Last Year: Never true  Transportation Needs: No Transportation Needs (08/03/2023)   Received from [facility]    In the past 12 months, has lack of reliable transportation kept you from medical appointments, meetings, work or from getting things needed for daily living? : No  Physical Activity: Not on file  Stress: Not on file  Social Connections: Not on file    MEDICATIONS:  Current Outpatient Medications  Medication Sig Dispense Refill   acetaminophen (TYLENOL) 500 MG tablet Take 1,300 mg by mouth at bedtime.     alfuzosin (UROXATRAL) 10 MG 24 hr tablet Take 10 mg by mouth at bedtime. Reported on 12/18/2015     amLODipine (NORVASC) 10 MG tablet TAKE 1 TABLET DAILY 90 tablet 3   apixaban (ELIQUIS) 5 MG TABS tablet Take 1 tablet (5 mg total) by mouth 2 (two) times daily. 180 tablet  1   atorvastatin (LIPITOR) 40 MG tablet TAKE 1 TABLET DAILY 90 tablet 3   Blood Glucose Monitoring Suppl (FREESTYLE LITE) w/Device KIT 1 Device by Does not apply route 2 (two) times daily. 1 kit 0   Cinnamon 500 MG capsule Take 500 mg by mouth 2 (two) times daily.     Continuous Glucose Sensor (FREESTYLE LIBRE 3 SENSOR) MISC 1 each by Does not apply route every 14 (fourteen) days. 6 each 1   ezetimibe (ZETIA) 10 MG tablet Take 1 tablet (10 mg total) by mouth daily. 90 tablet 3   finasteride (PROSCAR) 5 MG tablet Take 5 mg by mouth every morning. Reported on 12/18/2015     Glucagon (GVOKE HYPOPEN 1-PACK) 1 MG/0.2ML SOAJ Inject 1 mg into the skin as needed (low blood sugar with impaired consciousness). 0.4 mL 2   glucose blood (FREESTYLE LITE) test strip USE TO TEST TWICE A DAY 100 strip 2   Insulin Pen Needle (PEN NEEDLES) 31G X 5 MM MISC 1 each by Does not apply route 2 (two) times daily. 180 each 2   lisinopril (ZESTRIL) 20 MG tablet TAKE 1 TABLET DAILY 90 tablet 3   nitroGLYCERIN (NITROSTAT) 0.4 MG SL tablet Place 1 tablet (0.4 mg total) under the tongue every 5 (five) minutes as needed for chest pain. 25 tablet 3   Omega-3 Fatty Acids (OMEGA-3 FISH OIL PO) Take 1 capsule by mouth daily.     prednisoLONE acetate (PRED FORTE) 1 % ophthalmic suspension PLACE 1 DROP INTO THE RIGHT EYE DAILY.     Insulin Lispro Prot & Lispro (HUMALOG MIX 75/25 KWIKPEN) (75-25) 100 UNIT/ML Kwikpen 15 units with breakfast and 10 units with supper. 30 mL 4   metFORMIN (GLUCOPHAGE-XR) 750 MG 24 hr tablet Take  1 tablet (750 mg total) by mouth 2 (two) times daily with a meal. 180 tablet 3   No current facility-administered medications for this visit.    PHYSICAL EXAM: Vitals:   09/20/23 1311  BP: 122/60  Pulse: 79  Resp: 20  SpO2: 99%  Weight: 194 lb 3.2 oz (88.1 kg)  Height: 6' (1.829 m)   Body mass index is 26.34 kg/m.  Wt Readings from Last 3 Encounters:  09/20/23 194 lb 3.2 oz (88.1 kg)  07/07/23 189 lb  12.8 oz (86.1 kg)  05/18/23 192 lb (87.1 kg)    General: Well developed, well nourished male in no apparent distress.  HEENT: AT/[place], no external lesions. Hearing impairment.  Eyes: Conjunctiva clear and no icterus. Neck: Neck supple  Lungs: Respirations not labored Neurologic: Alert, oriented, normal speech Extremities / Skin: Dry.   Psychiatric: Does not appear depressed or anxious  Diabetic Foot Exam - Simple   No data filed    LABS Reviewed Lab Results  Component Value Date   HGBA1C 5.6 09/20/2023   HGBA1C 6.0 (A) 03/12/2023   HGBA1C 7.2 (H) 01/07/2023   Lab Results  Component Value Date   FRUCTOSAMINE 251 04/27/2019   Lab Results  Component Value Date   CHOL 150 01/07/2023   HDL 51.80 01/07/2023   LDLCALC 78 01/07/2023   TRIG 101.0 01/07/2023   CHOLHDL 3 01/07/2023   Lab Results  Component Value Date   MICRALBCREAT 4.2 05/11/2023   MICRALBCREAT 8.9 01/28/2022   Lab Results  Component Value Date   CREATININE 1.20 05/11/2023   Lab Results  Component Value Date   GFR 50.61 (L) 01/07/2023    ASSESSMENT / PLAN  1. Type 2 diabetes mellitus with other specified complication, with long-term current use of insulin (HCC)   2. Proliferative diabetic retinopathy of both eyes associated with type 2 diabetes mellitus, unspecified proliferative retinopathy type (HCC)   3. Hypoglycemia due to insulin     Diabetes Mellitus type 2, complicated by diabetic retinopathy. - Diabetic status / severity: Controlled with hypoglycemia.  Lab Results  Component Value Date   HGBA1C 5.6 09/20/2023    - Hemoglobin A1c goal : <7.5%  Patient is having frequent hypoglycemia.  Discussed that frequent hypoglycemia is detrimental to the health and can cause immediate problem, including seizure, loss of consciousness and even life-threatening.  It is okay to have permissive hyperglycemia with hemoglobin A1c close to 7 and half percent, to avoid hypoglycemia.  Discussed with  patient and daughter in the clinic today and they agreed.  - Medications: See below. Diabetes regimen: Decrease Humalog mix 75/25 take from 18 to 15 units with breakfast and from 14 to 10 units with supper, 10 minutes before eating.   Metformin XR 750 mg  two times a day.   - Home glucose testing: CGM Freestyle libre 3 and check as needed. - Discussed/ Gave Hypoglycemia treatment plan.  Always carry glucose and take glucose tablets to correct hypoglycemia may take 2 to 4 tablets at a time.  Glucagon Emergency Kit prescribed.  Asked to call our clinic if he still has hypoglycemia in between the visit.  Discussed about optimal glucose around 100-150 range in the morning fasting and in between the meals and up to 200 range after meals.  # Consult : not required at this time.   # Annual urine for microalbuminuria/ creatinine ratio, no microalbuminuria currently. Last  Lab Results  Component Value Date   MICRALBCREAT 4.2 05/11/2023    #  Foot check nightly / neuropathy.  # Patient had diabetic retinopathy, regularly following with ophthalmology at [facility].  2. Blood pressure  -  BP Readings from Last 1 Encounters:  09/20/23 122/60    - Control is in target.  - No change in current plans.  3. Lipid status / Hyperlipidemia - Last  Lab Results  Component Value Date   LDLCALC 78 01/07/2023   - Continue atorvastatin 40 mg daily.  Diagnoses and all orders for this visit:  Type 2 diabetes mellitus with other specified complication, with long-term current use of insulin (HCC) -     POCT glycosylated hemoglobin (Hb A1C) -     metFORMIN (GLUCOPHAGE-XR) 750 MG 24 hr tablet; Take 1 tablet (750 mg total) by mouth 2 (two) times daily with a meal.  Proliferative diabetic retinopathy of both eyes associated with type 2 diabetes mellitus, unspecified proliferative retinopathy type (HCC)  Hypoglycemia due to insulin  Other orders -     Insulin Lispro Prot & Lispro (HUMALOG MIX  75/25 KWIKPEN) (75-25) 100 UNIT/ML Kwikpen; 15 units with breakfast and 10 units with supper. -     Glucagon (GVOKE HYPOPEN 1-PACK) 1 MG/0.2ML SOAJ; Inject 1 mg into the skin as needed (low blood sugar with impaired consciousness).    DISPOSITION Follow up in clinic in 6 weeks suggested.   All questions answered and patient verbalized understanding of the plan.  Steve Kyia Rhude, MD [facility] Endocrinology [facility] [address] [place], [address] [place], [place] [address] Phone # [phone]  At least part of this note was generated using voice recognition software. Inadvertent word errors may have occurred, which were not recognized during the proofreading process.

## 2023-09-20 NOTE — Patient Instructions (Addendum)
Diabetes regimen: Decrease Humalog mix 75/25 take 15 units with breakfast and 10 units with supper, 10 minutes before eating.   Metformin XR 750 mg  two times a day.   Take glucose tablet for low sugar 2-4 tabes at a time.  Glucagon kit for very low sugar and if not able to eat eg when passed out.

## 2023-09-30 DIAGNOSIS — C61 Malignant neoplasm of prostate: Secondary | ICD-10-CM | POA: Diagnosis not present

## 2023-10-08 DIAGNOSIS — Z961 Presence of intraocular lens: Secondary | ICD-10-CM | POA: Diagnosis not present

## 2023-10-08 DIAGNOSIS — Z947 Corneal transplant status: Secondary | ICD-10-CM | POA: Diagnosis not present

## 2023-10-08 DIAGNOSIS — H18513 Endothelial corneal dystrophy, bilateral: Secondary | ICD-10-CM | POA: Diagnosis not present

## 2023-10-08 DIAGNOSIS — E113513 Type 2 diabetes mellitus with proliferative diabetic retinopathy with macular edema, bilateral: Secondary | ICD-10-CM | POA: Diagnosis not present

## 2023-10-08 DIAGNOSIS — H353221 Exudative age-related macular degeneration, left eye, with active choroidal neovascularization: Secondary | ICD-10-CM | POA: Diagnosis not present

## 2023-10-12 DIAGNOSIS — C61 Malignant neoplasm of prostate: Secondary | ICD-10-CM | POA: Diagnosis not present

## 2023-10-12 DIAGNOSIS — N471 Phimosis: Secondary | ICD-10-CM | POA: Diagnosis not present

## 2023-10-15 ENCOUNTER — Other Ambulatory Visit: Payer: Self-pay | Admitting: Student

## 2023-10-15 DIAGNOSIS — E538 Deficiency of other specified B group vitamins: Secondary | ICD-10-CM | POA: Diagnosis not present

## 2023-10-29 ENCOUNTER — Encounter: Payer: Self-pay | Admitting: Pediatrics

## 2023-11-03 ENCOUNTER — Encounter: Payer: Self-pay | Admitting: Endocrinology

## 2023-11-03 ENCOUNTER — Ambulatory Visit (INDEPENDENT_AMBULATORY_CARE_PROVIDER_SITE_OTHER): Payer: Medicare Other | Admitting: Endocrinology

## 2023-11-03 VITALS — BP 130/60 | HR 83 | Resp 18 | Ht 72.0 in | Wt 194.0 lb

## 2023-11-03 DIAGNOSIS — Z794 Long term (current) use of insulin: Secondary | ICD-10-CM | POA: Diagnosis not present

## 2023-11-03 DIAGNOSIS — E1169 Type 2 diabetes mellitus with other specified complication: Secondary | ICD-10-CM

## 2023-11-03 MED ORDER — INSULIN LISPRO PROT & LISPRO (75-25 MIX) 100 UNIT/ML KWIKPEN: PEN_INJECTOR | SUBCUTANEOUS | 4 refills | Status: DC

## 2023-11-03 NOTE — Progress Notes (Signed)
Outpatient Endocrinology Note [place] Roland Prine, MD  11/03/23  Patient's Name: Steve Bailey    DOB: Dec 23, 1933    MRN: [id number]                                                    REASON OF VISIT: Follow up of type 2 diabetes mellitus  PCP: [facility]  HISTORY OF PRESENT ILLNESS:        Steve Bailey is a 89 y.o. old male with past medical history listed below, is here for follow up for type 2 diabetes mellitus.   Pertinent Diabetes History: Patient was diagnosed with type 2 diabetes mellitus in 1995.  Steve Bailey and has controlled type 2 diabetes mellitus.  Chronic Diabetes Complications : Retinopathy: yes, proliferative diabetic retinopathy bilateral eyes. Following with ophthalmology regularly, [facility].  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 Nephropathy: no, on ACE/ARB /lisinopril. Peripheral neuropathy: yes, numbness / .  Coronary artery disease: yes, s/p CABG in 1997.  Stroke: no  Relevant comorbidities and cardiovascular risk factors: Obesity: no Body mass index is 26.31 kg/m.  Hypertension: Yes  Hyperlipidemia : Yes, on statin   Current / Home Diabetic regimen includes: Humalog mix 75/25 KwikPen 1   4 units before breakfast and 10 units with supper. Metformin extended release 750 mg 2 tablets daily.  Patient has been taking 1 tablet 2 times a day.  Prior diabetic medications:  Glycemic data:    CONTINUOUS GLUCOSE MONITORING SYSTEM (CGMS) INTERPRETATION: At today's visit, we reviewed CGM downloads. The full report is scanned in the media. Reviewing the CGM trends, blood glucose are as follows:  FreeStyle Libre 3 CGM-  Sensor Download (Sensor download was reviewed and summarized below.) Dates: November 28 to December 11 , 2024, 14 days. Sensor Average: 122  Glucose Management Indicator: 6.2%  % data captured: 96%    Interpretation: Hypoglycemia has decreased from 9% to 2%  compared to last visit.  Steve is having occasional hypoglycemia in the early morning in 60s and occasionally in the afternoon especially when his skipping lunch.  Most of the other time acceptable blood sugar.  No concerning hyperglycemia.  Hypoglycemia: Patient has minor hypoglycemic episodes. Patient has hypoglycemia awareness.  Factors modifying glucose control: 1.  Diabetic diet assessment: 3 meals a day.  2.  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  3.  Medication compliance: compliant all of the time.  Interval history  CGM data as reviewed above.  Diabetes regimen and insulin regimen as noted above.  Steve has complaints of tingling of the feet.  No other complaints today.  Steve has decreased percentage of hypoglycemia compared to last visit.  Recent hemoglobin A1c 5.6% in October.  Patient is accompanied by daughter in the clinic today.  REVIEW OF SYSTEMS As per history of present illness.   PAST MEDICAL HISTORY: Past Medical History:  Diagnosis Date   AAA (abdominal aortic aneurysm) (HCC) followed by dr Imogene Burn   last duplex 06/28/2018 4.6 cm,  asymptomatic   Anticoagulant long-term use    eliquis   Benign localized prostatic hyperplasia with lower urinary tract symptoms (LUTS)    CAD (coronary artery disease) cardiologist-  dr Purvis Sheffield   remote CABG in 1997; prior PCI to the LAD i n1990;  Nuclear study in February of 2012 normal with an EF of 60%    DDD (degenerative disc disease), cervical    Full dentures    History of radiation Bailey 11/24/12-01/18/13   Prostate 78Gy/81fx   HOH (hard of hearing)    both   Hyperlipidemia    Hyperlipidemia    Hypertension    Permanent atrial fibrillation (HCC) 09/2012   followed by dr Purvis Sheffield   Phimosis    severe   Proliferative diabetic retinopathy (HCC)    both eyes   Prostate cancer [facility]) urologist-- dr Berneice Heinrich   dx 09-02-2012-- Stage T2a, Gleason 3+4=7, PSA 8.12, vol 103 cc--- completed external beam radiation 02/ 2014;   recurrent 2015 , started hormone Bailey   S/P CABG x 3 1997   Strains to urinate    Type 2 diabetes mellitus treated with insulin [facility])    endocrinologist-- dr Lucianne Muss    PAST SURGICAL HISTORY: Past Surgical History:  Procedure Laterality Date   CARDIAC CATHETERIZATION  01-18-2004   dr Deborah Chalk   patent grafts, ef 55%, minimal anterior hypokinesis,  severe total occlusion of LCFx and LAD,  severe disease of D2 and segmental RCA   CARDIOVASCULAR STRESS TEST  01/07/2011   normal nuclear study w/ no ischemia/  normal LV function and wall motion,  ef 60%   CARPAL TUNNEL RELEASE Right    CATARACT EXTRACTION W/ INTRAOCULAR LENS  IMPLANT, BILATERAL  left 2006;  right 2010   CIRCUMCISION N/A 07/28/2018   Procedure: CIRCUMCISION ADULT;  Surgeon: Sebastian Ache, MD;  Location: [facility];  Service: Urology;  Laterality: N/A;   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   CORONARY ANGIOPLASTY  1990   LAD   CORONARY ARTERY BYPASS GRAFT  11-1995   shows distal LAD disease, without recurrent symptoms of angina with questionable mild apical ischemia but with normal EF    ESOPHAGOGASTRODUODENOSCOPY (EGD) WITH ESOPHAGEAL DILATION  05/2012   KNEE ARTHROSCOPY Left    TRANSTHORACIC ECHOCARDIOGRAM  10/10/2012   ef 55%, akinesis of the inferolateral wall,  grade 2 diastolic dysfunction/  trivial AR/  mild MR and TR/  mild LAE/  mild RVSF reduced and mild dilated RV    ALLERGIES: Allergies  Allergen Reactions   Cephalexin Nausea And Vomiting    FAMILY HISTORY:  Family History  Problem Relation Age of Onset   Stroke Father    Heart attack Mother    Heart disease Mother    Cancer Sister        lung   Cancer Brother        prostate   Diabetes Brother    Stroke Brother    Stroke Brother    Heart disease Brother    Heart disease Brother    Heart attack Sister    Heart disease Sister    Heart attack Sister    Cancer Sister        breast   Heart disease Sister    Heart attack Sister     Heart disease Sister    Heart disease Sister     SOCIAL HISTORY: Social History   Socioeconomic History   Marital status: Widowed    Spouse name: Not on file   Number of children: Not on file   Years of education: Not on file   Highest education level: Not on file  Occupational History   Not on file  Tobacco Use   Smoking status: Former    Current packs/day: 0.00    Types:  Cigarettes    Start date: 10/14/1959    Quit date: 10/14/1979    Years since quitting: 44.0   Smokeless tobacco: Former    Types: Chew    Quit date: 11/23/2012  Vaping Use   Vaping status: Never Used  Substance and Sexual Activity   Alcohol use: No   Drug use: No   Sexual activity: Not on file  Other Topics Concern   Not on file  Social History Narrative   Not on file   Social Determinants of Health   Financial Resource Strain: Not on file  Food Insecurity: Low Risk  (08/03/2023)   Received from [facility]   Hunger Vital Sign    Worried About Running Out of Food in the Last Year: Never true    Ran Out of Food in the Last Year: Never true  Transportation Needs: No Transportation Needs (08/03/2023)   Received from [facility]    In the past 12 months, has lack of reliable transportation kept you from medical appointments, meetings, work or from getting things needed for daily living? : No  Physical Activity: Not on file  Stress: Not on file  Social Connections: Not on file    MEDICATIONS:  Current Outpatient Medications  Medication Sig Dispense Refill   acetaminophen (TYLENOL) 500 MG tablet Take 1,300 mg by mouth at bedtime.     alfuzosin (UROXATRAL) 10 MG 24 hr tablet Take 10 mg by mouth at bedtime. Reported on 12/18/2015     amLODipine (NORVASC) 10 MG tablet TAKE 1 TABLET DAILY 90 tablet 3   apixaban (ELIQUIS) 5 MG TABS tablet Take 1 tablet (5 mg total) by mouth 2 (two) times daily. 180 tablet 1   atorvastatin (LIPITOR) 40 MG tablet TAKE 1 TABLET DAILY 90 tablet 3    Blood Glucose Monitoring Suppl (FREESTYLE LITE) w/Device KIT 1 Device by Does not apply route 2 (two) times daily. 1 kit 0   Cinnamon 500 MG capsule Take 500 mg by mouth 2 (two) times daily.     Continuous Glucose Sensor (FREESTYLE LIBRE 3 SENSOR) MISC 1 each by Does not apply route every 14 (fourteen) days. 6 each 1   ezetimibe (ZETIA) 10 MG tablet Take 1 tablet (10 mg total) by mouth daily. 90 tablet 3   finasteride (PROSCAR) 5 MG tablet Take 5 mg by mouth every morning. Reported on 12/18/2015     Glucagon (GVOKE HYPOPEN 1-PACK) 1 MG/0.2ML SOAJ Inject 1 mg into the skin as needed (low blood sugar with impaired consciousness). 0.4 mL 2   glucose blood (FREESTYLE LITE) test strip USE TO TEST TWICE A DAY 100 strip 2   Insulin Pen Needle (PEN NEEDLES) 31G X 5 MM MISC 1 each by Does not apply route 2 (two) times daily. 180 each 2   lisinopril (ZESTRIL) 20 MG tablet TAKE 1 TABLET DAILY 90 tablet 3   metFORMIN (GLUCOPHAGE-XR) 750 MG 24 hr tablet Take 1 tablet (750 mg total) by mouth 2 (two) times daily with a meal. 180 tablet 3   nitroGLYCERIN (NITROSTAT) 0.4 MG SL tablet Place 1 tablet (0.4 mg total) under the tongue every 5 (five) minutes as needed for chest pain. 25 tablet 3   Omega-3 Fatty Acids (OMEGA-3 FISH OIL PO) Take 1 capsule by mouth daily.     prednisoLONE acetate (PRED FORTE) 1 % ophthalmic suspension PLACE 1 DROP INTO THE RIGHT EYE DAILY.     Insulin Lispro Prot & Lispro (HUMALOG MIX 75/25 KWIKPEN) (  75-25) 100 UNIT/ML Kwikpen 12 units with breakfast and 8 units with supper. 30 mL 4   No current facility-administered medications for this visit.    PHYSICAL EXAM: Vitals:   11/03/23 1057  BP: 130/60  Pulse: 83  Resp: 18  SpO2: 98%  Weight: 194 lb (88 kg)  Height: 6' (1.829 m)    Body mass index is 26.31 kg/m.  Wt Readings from Last 3 Encounters:  11/03/23 194 lb (88 kg)  09/20/23 194 lb 3.2 oz (88.1 kg)  07/07/23 189 lb 12.8 oz (86.1 kg)    General: Well developed, well  nourished male in no apparent distress.  HEENT: AT/[place], no external lesions. Hearing impairment.  Eyes: Conjunctiva clear and no icterus. Neck: Neck supple  Lungs: Respirations not labored Neurologic: Alert, oriented, normal speech Extremities / Skin: Dry.   Psychiatric: Does not appear depressed or anxious  Diabetic Foot Exam - Simple   Simple Foot Form Diabetic Foot exam was performed with the following findings: Yes 11/03/2023 11:15 AM  Visual Inspection See comments: Yes Sensation Testing Intact to touch and monofilament testing bilaterally: Yes Pulse Check Posterior Tibialis and Dorsalis pulse intact bilaterally: Yes Comments B/l callus +, dystrophic nails. No ulcer. DP palpable bilaterally.     LABS Reviewed Lab Results  Component Value Date   HGBA1C 5.6 09/20/2023   HGBA1C 6.0 (A) 03/12/2023   HGBA1C 7.2 (H) 01/07/2023   Lab Results  Component Value Date   FRUCTOSAMINE 251 04/27/2019   Lab Results  Component Value Date   CHOL 150 01/07/2023   HDL 51.80 01/07/2023   LDLCALC 78 01/07/2023   TRIG 101.0 01/07/2023   CHOLHDL 3 01/07/2023   Lab Results  Component Value Date   MICRALBCREAT 4.2 05/11/2023   MICRALBCREAT 8.9 01/28/2022   Lab Results  Component Value Date   CREATININE 1.20 05/11/2023   Lab Results  Component Value Date   GFR 50.61 (L) 01/07/2023    ASSESSMENT / PLAN  1. Type 2 diabetes mellitus with other specified complication, with long-term current use of insulin (HCC)      Diabetes Mellitus type 2, complicated by diabetic retinopathy and neuropathy. - Diabetic status / severity: Controlled with occasional hypoglycemia.  Lab Results  Component Value Date   HGBA1C 5.6 09/20/2023    - Hemoglobin A1c goal : <7.5%  It is okay to have permissive hyperglycemia with hemoglobin A1c close to 7 and half percent, to avoid hypoglycemia.  Hypoglycemia has decreased after decreasing dose of insulin in last visit.  Will further decrease  insulin and adjusted diabetes regimen as follows.   Medications: See below. Diabetes regimen: Decrease Humalog mix 75/25 take from 14 to 12 units with breakfast and from 10 to 8 units with supper, 10 minutes before eating.   Metformin XR 750 mg  two times a day.   Advised to take less Humalog in the morning when there is anticipated increased physical activity or skipping meal in the afternoon.  - Home glucose testing: CGM Freestyle libre 3 and check as needed. - Discussed/ Gave Hypoglycemia treatment plan.  Always carry glucose and take glucose tablets to correct hypoglycemia may take 2 to 4 tablets at a time.  Glucagon Emergency Kit prescribed.  Asked to call our clinic if Steve still has hypoglycemia in between the visit.  Discussed about optimal glucose around 100-150 range in the morning fasting and in between the meals and up to 200 range after meals.  # Consult : not required at this  time.   # Annual urine for microalbuminuria/ creatinine ratio, no microalbuminuria currently. Last  Lab Results  Component Value Date   MICRALBCREAT 4.2 05/11/2023    # Foot check nightly / neuropathy.  Offered podiatry referral, patient declined.  # Patient had diabetic retinopathy, regularly following with ophthalmology at [facility].  2. Blood pressure  -  BP Readings from Last 1 Encounters:  11/03/23 130/60    - Control is in target.  - No change in current plans.  Slightly low diastolic blood pressure.  Patient will continue to monitor at home and if if Steve still has low will contact primary care provider/cardiology.  3. Lipid status / Hyperlipidemia - Last  Lab Results  Component Value Date   LDLCALC 78 01/07/2023   - Continue atorvastatin 40 mg daily.  Managed by PCP/cardiology.  Diagnoses and all orders for this visit:  Type 2 diabetes mellitus with other specified complication, with long-term current use of insulin (HCC)  Other orders -     Insulin Lispro Prot & Lispro  (HUMALOG MIX 75/25 KWIKPEN) (75-25) 100 UNIT/ML Kwikpen; 12 units with breakfast and 8 units with supper.     DISPOSITION Follow up in clinic in 3 months suggested.   All questions answered and patient verbalized understanding of the plan.  [place] Felder Lebeda, MD [facility] Endocrinology [facility] [address] [place], [address] [place], [place] [address] Phone # [phone]  At least part of this note was generated using voice recognition software. Inadvertent word errors may have occurred, which were not recognized during the proofreading process.

## 2023-11-03 NOTE — Patient Instructions (Signed)
Diabetes regimen: Decrease Humalog mix 75/25 take 12 units with breakfast and 10 units with supper, 8 minutes before eating.   Metformin XR 750 mg  two times a day.   Take glucose tablet for low sugar 2-4 tabes at a time.  Glucagon kit for very low sugar and if not able to eat eg when passed out.

## 2023-11-05 NOTE — Progress Notes (Unsigned)
11/08/2023 Steve Bailey [id number] 1934/07/21  Referring provider: Roe Coombs* Primary GI doctor: Dr. Leonides Schanz  ASSESSMENT AND PLAN:   Dysphagia Predominantly with solids, particularly meats. History of food impaction in 2013. No weight loss or vomiting. Occasional regurgitation of food. No signs of GERD. -Schedule endoscopy with dilation, should be appropriate at [facility] . -Start Protonix for potential inflammation. -Advise on dysphagia diet:warm beverages, eat slowly, small bites, alternate solids and liquids, finely chopped food. -If food impaction occurs, advise to go to ER due to risk of esophageal perforation.  Atrial Fibrillation On Eliquis for stroke prevention. No current cardiac symptoms. -Coordinate with prescribing provider [name], [place]) to hold Eliquis for 2 days prior to endoscopy.  Abdominal Aortic Aneurysm Under surveillance, next duplex scan scheduled for 11/30/2023. -Continue surveillance as planned.  General Health Maintenance -Continue current medications and follow-up appointments as scheduled.  - due to age and lack of symptoms, no screening colonoscopy.   Patient Care Team: [facility] as PCP - General (Family Medicine) Berneice Heinrich, Delbert Phenix., MD as Consulting Physician (Urology) Reather Littler, MD (Inactive) as Consulting Physician (Endocrinology)  HISTORY OF PRESENT ILLNESS: 89 y.o. male with a past medical history of CAD (s/p CABG in 1997, patent grafts by cath in 2005, low-risk NST in 2012 and 12/2020), permanent atrial fibrillation on Eliquis, HTN, HLD, IDDM and AAA (at 5.4 cm by duplex in 03/2023 but 5.0 cm by CTA in 04/2023) and others listed below presents for evaluation of dysphagia.   Never had a colonoscopy and wishes not to have one.  2013 patient has ER visit to [facility] for food impaction, had hiatal hernia and esophageal stenosis.  Discussed the use of AI scribe  software for clinical note transcription with the patient, who gave verbal consent to proceed.  History of Present Illness   The patient, with a history of heart disease and on Eliquis, presents with swallowing difficulties. He reports that food, particularly meat, often gets 'stuck' during swallowing, causing discomfort. This issue has been ongoing for several years, with a notable episode in 2013 when he presented to the ER with a food impaction. The patient denies any issues with swallowing liquids. He occasionally experiences acid reflux but is not on any medication for this.  The patient also reports a history of a hernia, which he believes may be contributing to his swallowing difficulties. He has not experienced any weight loss, nausea, or vomiting. When food does get stuck, he sometimes has to spit it back up or drink a cold beverage to help it go down.  The patient has a history of smoking but quit approximately 44 years ago. He also quit chewing tobacco about 11 years ago. He denies any alcohol or drug use.  The patient is also being monitored for an aortic aneurysm, with a follow-up scheduled in January. He has regular check-ups every six months for his heart condition. Despite these health issues, the patient remains active and is able to manage his daily activities.     He  reports that he quit smoking about 44 years ago. His smoking use included cigarettes. He started smoking about 64 years ago. He quit smokeless tobacco use about 10 years ago.  His smokeless tobacco use included chew. He reports that he does not drink alcohol and does not use drugs.  RELEVANT LABS AND IMAGING:  CBC    Component Value Date/Time   WBC 5.3 01/07/2023 0926   RBC  3.70 (L) 01/07/2023 0926   HGB 12.4 (L) 01/07/2023 0926   HCT 35.9 (L) 01/07/2023 0926   PLT 135.0 (L) 01/07/2023 0926   MCV 97.0 01/07/2023 0926   MCH 33.1 08/09/2022 0101   MCHC 34.4 01/07/2023 0926   RDW 14.1 01/07/2023 0926    LYMPHSABS 0.9 01/24/2021 1204   MONOABS 0.3 01/24/2021 1204   EOSABS 0.1 01/24/2021 1204   BASOSABS 0.0 01/24/2021 1204   Recent Labs    01/07/23 0926  HGB 12.4*    CMP     Component Value Date/Time   NA 136 01/07/2023 0926   NA 140 09/11/2016 1645   K 4.9 01/07/2023 0926   CL 102 01/07/2023 0926   CO2 26 01/07/2023 0926   GLUCOSE 194 (H) 01/07/2023 0926   BUN 22 01/07/2023 0926   BUN 23 09/11/2016 1645   CREATININE 1.20 05/11/2023 1106   CALCIUM 9.6 01/07/2023 0926   PROT 6.8 01/07/2023 0926   ALBUMIN 4.1 01/07/2023 0926   AST 14 01/07/2023 0926   ALT 12 01/07/2023 0926   ALKPHOS 71 01/07/2023 0926   BILITOT 0.8 01/07/2023 0926   GFRNONAA 60 09/11/2016 1645   GFRAA 69 09/11/2016 1645      Latest Ref Rng & Units 01/07/2023    9:26 AM 01/28/2022   10:56 AM 09/30/2021   10:50 AM  Hepatic Function  Total Protein 6.0 - 8.3 g/dL 6.8  6.5  6.6   Albumin 3.5 - 5.2 g/dL 4.1  4.4  4.0   AST 0 - 37 U/L 14  17  25    ALT 0 - 53 U/L 12  14  24    Alk Phosphatase 39 - 117 U/L 71  91  75   Total Bilirubin 0.2 - 1.2 mg/dL 0.8  1.0  0.8       Current Medications:   Current Outpatient Medications (Endocrine & Metabolic):    Glucagon (GVOKE HYPOPEN 1-PACK) 1 MG/0.2ML SOAJ, Inject 1 mg into the skin as needed (low blood sugar with impaired consciousness).   Insulin Lispro Prot & Lispro (HUMALOG MIX 75/25 KWIKPEN) (75-25) 100 UNIT/ML Kwikpen, 12 units with breakfast and 8 units with supper.   metFORMIN (GLUCOPHAGE-XR) 750 MG 24 hr tablet, Take 1 tablet (750 mg total) by mouth 2 (two) times daily with a meal.  Current Outpatient Medications (Cardiovascular):    amLODipine (NORVASC) 10 MG tablet, TAKE 1 TABLET DAILY   atorvastatin (LIPITOR) 40 MG tablet, TAKE 1 TABLET DAILY   ezetimibe (ZETIA) 10 MG tablet, Take 1 tablet (10 mg total) by mouth daily.   lisinopril (ZESTRIL) 20 MG tablet, TAKE 1 TABLET DAILY   nitroGLYCERIN (NITROSTAT) 0.4 MG SL tablet, Place 1 tablet (0.4 mg total)  under the tongue every 5 (five) minutes as needed for chest pain.   Current Outpatient Medications (Analgesics):    acetaminophen (TYLENOL) 500 MG tablet, Take 1,300 mg by mouth at bedtime.  Current Outpatient Medications (Hematological):    apixaban (ELIQUIS) 5 MG TABS tablet, Take 1 tablet (5 mg total) by mouth 2 (two) times daily.   ferrous sulfate 325 (65 FE) MG tablet, Take 1 tablet by mouth daily.  Current Outpatient Medications (Other):    alfuzosin (UROXATRAL) 10 MG 24 hr tablet, Take 10 mg by mouth at bedtime. Reported on 12/18/2015   Blood Glucose Monitoring Suppl (FREESTYLE LITE) w/Device KIT, 1 Device by Does not apply route 2 (two) times daily.   Cinnamon 500 MG capsule, Take 500 mg by mouth 2 (two)  times daily.   Continuous Glucose Sensor (FREESTYLE LIBRE 3 SENSOR) MISC, 1 each by Does not apply route every 14 (fourteen) days.   finasteride (PROSCAR) 5 MG tablet, Take 5 mg by mouth every morning. Reported on 12/18/2015   Insulin Pen Needle (PEN NEEDLES) 31G X 5 MM MISC, 1 each by Does not apply route 2 (two) times daily.   Omega-3 Fatty Acids (OMEGA-3 FISH OIL PO), Take 1 capsule by mouth daily.   pantoprazole (PROTONIX) 40 MG tablet, Take 1 tablet (40 mg total) by mouth daily.   prednisoLONE acetate (PRED FORTE) 1 % ophthalmic suspension, PLACE 1 DROP INTO THE RIGHT EYE DAILY.   glucose blood (FREESTYLE LITE) test strip, USE TO TEST TWICE A DAY (Patient not taking: Reported on 11/08/2023)  Medical History:  Past Medical History:  Diagnosis Date   AAA (abdominal aortic aneurysm) (HCC) followed by dr Imogene Burn   last duplex 06/28/2018 4.6 cm,  asymptomatic   Anticoagulant long-term use    eliquis   Benign localized prostatic hyperplasia with lower urinary tract symptoms (LUTS)    CAD (coronary artery disease) cardiologist-  dr Purvis Sheffield   remote CABG in 1997; prior PCI to the LAD i n1990; Nuclear study in February of 2012 normal with an EF of 60%    DDD (degenerative disc  disease), cervical    Full dentures    History of radiation therapy 11/24/12-01/18/13   Prostate 78Gy/32fx   HOH (hard of hearing)    both   Hyperlipidemia    Hyperlipidemia    Hypertension    Permanent atrial fibrillation (HCC) 09/2012   followed by dr Purvis Sheffield   Phimosis    severe   Proliferative diabetic retinopathy (HCC)    both eyes   Prostate cancer [facility]) urologist-- dr Berneice Heinrich   dx 09-02-2012-- Stage T2a, Gleason 3+4=7, PSA 8.12, vol 103 cc--- completed external beam radiation 02/ 2014;  recurrent 2015 , started hormone therapy   S/P CABG x 3 1997   Strains to urinate    Type 2 diabetes mellitus treated with insulin [facility])    endocrinologist-- dr Lucianne Muss   Allergies:  Allergies  Allergen Reactions   Cephalexin Nausea And Vomiting   Tramadol Palpitations     Surgical History:  He  has a past surgical history that includes Coronary artery bypass graft ([id number]); Cardiovascular stress test (01/07/2011); Carpal tunnel release (Right); Esophagogastroduodenoscopy (egd) with esophageal dilation (05/2012); Knee arthroscopy (Left); Cataract extraction w/ intraocular lens  implant, bilateral (left 2006;  right 2010); transthoracic echocardiogram (10/10/2012); Coronary angioplasty (1990); Cardiac catheterization (01-18-2004   dr Deborah Chalk); Corneal transplant (Right, 12/16/2017); and Circumcision (N/A, 07/28/2018). Family History:  His family history includes Cancer in his brother, sister, and sister; Diabetes in his brother; Heart attack in his mother, sister, sister, and sister; Heart disease in his brother, brother, mother, sister, sister, sister, and sister; Stroke in his brother, brother, and father.  REVIEW OF SYSTEMS  : All other systems reviewed and negative except where noted in the History of Present Illness.  PHYSICAL EXAM: BP 118/74   Pulse 80   Ht 6' (1.829 m)   Wt 194 lb (88 kg)   SpO2 93%   BMI 26.31 kg/m  General Appearance: elderly appearing male, in no apparent  distress. Head:   Normocephalic and atraumatic. Eyes:  sclerae anicteric,conjunctive pink  Respiratory: Respiratory effort normal, BS equal bilaterally without rales, rhonchi, wheezing. Cardio: irreg irreg with no MRGs. Peripheral pulses intact.  Abdomen: Soft,  Non-distended ,active bowel sounds.  No tenderness . No masses. Rectal: declines Musculoskeletal: Full ROM, Normal gait, able to get on the table without assistance Without edema. Skin:  Dry and intact without significant lesions or rashes Neuro: Alert and  oriented x4;  No focal deficits. Psych:  Cooperative. Normal mood and affect.    Doree Albee, PA-C 11:01 AM

## 2023-11-08 ENCOUNTER — Encounter: Payer: Self-pay | Admitting: Physician Assistant

## 2023-11-08 ENCOUNTER — Ambulatory Visit: Payer: Medicare Other | Admitting: Physician Assistant

## 2023-11-08 ENCOUNTER — Telehealth: Payer: Self-pay | Admitting: *Deleted

## 2023-11-08 VITALS — BP 118/74 | HR 80 | Ht 72.0 in | Wt 194.0 lb

## 2023-11-08 DIAGNOSIS — E1169 Type 2 diabetes mellitus with other specified complication: Secondary | ICD-10-CM

## 2023-11-08 DIAGNOSIS — R1310 Dysphagia, unspecified: Secondary | ICD-10-CM | POA: Diagnosis not present

## 2023-11-08 DIAGNOSIS — I4891 Unspecified atrial fibrillation: Secondary | ICD-10-CM | POA: Diagnosis not present

## 2023-11-08 DIAGNOSIS — I4821 Permanent atrial fibrillation: Secondary | ICD-10-CM

## 2023-11-08 DIAGNOSIS — I7143 Infrarenal abdominal aortic aneurysm, without rupture: Secondary | ICD-10-CM

## 2023-11-08 DIAGNOSIS — K219 Gastro-esophageal reflux disease without esophagitis: Secondary | ICD-10-CM

## 2023-11-08 DIAGNOSIS — I714 Abdominal aortic aneurysm, without rupture, unspecified: Secondary | ICD-10-CM

## 2023-11-08 DIAGNOSIS — I25810 Atherosclerosis of coronary artery bypass graft(s) without angina pectoris: Secondary | ICD-10-CM

## 2023-11-08 DIAGNOSIS — R1319 Other dysphagia: Secondary | ICD-10-CM

## 2023-11-08 MED ORDER — NA SULFATE-K SULFATE-MG SULF 17.5-3.13-1.6 GM/177ML PO SOLN: 1.0000 | Freq: Once | ORAL | 0 refills | Status: DC

## 2023-11-08 MED ORDER — PANTOPRAZOLE SODIUM 40 MG PO TBEC: 40.0000 mg | DELAYED_RELEASE_TABLET | Freq: Every day | ORAL | 1 refills | Status: DC

## 2023-11-08 NOTE — Patient Instructions (Addendum)
Dysphagia precautions:  1. Take reflux medications 30+ minutes before food in the morning 2. Begin meals with warm beverage 3. Eat smaller more frequent meals 4. Eat slowly, taking small bites and sips 5. Alternate solids and liquids 6. Avoid foods/liquids that increase acid production 7. Sit upright during and for 30+ minutes after meals to facilitate esophageal clearing 8. All meats should be chopped finely.   If something gets hung in your esophagus and will not come up or go down, proceed to the emergency room.    You will be contacted by our office prior to your procedure for directions on holding your [facility]   If you do not hear from our office 1 week prior to your scheduled procedure, please call [phone] to discuss.   You have been scheduled for an endoscopy. Please follow written instructions given to you at your visit today.  If you use inhalers (even only as needed), please bring them with you on the day of your procedure.  If you take any of the following medications, they will need to be adjusted prior to your procedure:   DO NOT TAKE 7 DAYS PRIOR TO TEST- Trulicity (dulaglutide) Ozempic, Wegovy (semaglutide) Mounjaro (tirzepatide) Bydureon Bcise (exanatide extended release)  DO NOT TAKE 1 DAY PRIOR TO YOUR TEST Rybelsus (semaglutide) Adlyxin (lixisenatide) Victoza (liraglutide) Byetta (exanatide) ___________________________________________________________________________  _  X_   ORAL DIABETIC MEDICATION INSTRUCTIONS  The day before your procedure:  Take your diabetic pill as you do normally  The day of your procedure:  Do not take your diabetic pill   We will check your blood sugar levels during the admission process and again in Recovery before discharging you home  ______________________________________________________________________  _  _   INSULIN (LONG ACTING) MEDICATION INSTRUCTIONS (Lantus, NPH, 70/30, Humulin, Novolin-N, Levemir, Toujeo,  Johann Capers )   The day before your procedure:  Take  your regular evening dose    The day of your procedure:  Do not take your morning dose   _ X _   INSULIN (SHORT ACTING) MEDICATION INSTRUCTIONS (Regular, Humulog, Novolog, Apidra, Novolin, Humulin)   The day before your procedure:  Do not take your evening dose   The day of your procedure:  Do not take your morning dose  ______________________________________________________________________                _ _ OTHER NON-INSULIN GLPI AGONIST INJECTABLE OR ORAL DAILY MEDICATIONS         (Tanzeum, Saxenda Byetta, Victoza, SymlinPen, Rebelsus)  Hold the day of the procedure ______________________________________________________________________________________________________________________________________________________     __  __ GLP 1 AGONIST Weekly injectables                   (Bydureon Bcise, [place], [place], [place], [place], [place])    Hold for 7 days prior to the procedure         Due to recent changes in healthcare laws, you may see the results of your imaging and laboratory studies on MyChart before your provider has had a chance to review them.  We understand that in some cases there may be results that are confusing or concerning to you. Not all laboratory results come back in the same time frame and the provider may be waiting for multiple results in order to interpret others.  Please give [place] 48 hours in order for your provider to thoroughly review all the results before contacting the office for clarification of your results.    I appreciate the  opportunity to care for you  Thank You   [facility]

## 2023-11-08 NOTE — Telephone Encounter (Signed)
[facility] Pre-operative Risk Assessment     Request for surgical clearance:     Endoscopy Procedure  What type of surgery is being performed?     EGD  When is this surgery scheduled?     11/16/2023  What type of clearance is required ?   Pharmacy  Are there any medications that need to be held prior to surgery and how long? [facility]  Practice name and name of physician performing surgery?      [facility]  What is your office phone and fax number?      Phone- [phone]  Fax- [phone]  Anesthesia type (None, local, MAC, general) ?       MAC   Please route your response to [facility]

## 2023-11-08 NOTE — Progress Notes (Signed)
I agree with the assessment and plan as outlined by Ms. Collier. 

## 2023-11-09 NOTE — Telephone Encounter (Signed)
Patient with diagnosis of A Fib on Eliquis for anticoagulation.    Procedure: EGD Date of procedure: 11/16/23   CHA2DS2-VASc Score = 5  This indicates a 7.2% annual risk of stroke. The patient's score is based upon: CHF History: 0 HTN History: 1 Diabetes History: 1 Stroke History: 0 Vascular Disease History: 1 Age Score: 2 Gender Score: 0    CrCl 63 mL/min Platelet count 138K  Per office protocol, patient can hold Eliquis for 2 days prior to procedure.    **This guidance is not considered finalized until pre-operative APP has relayed final recommendations.**

## 2023-11-09 NOTE — Telephone Encounter (Signed)
   Patient Name: Steve Bailey  DOB: Apr 06, 1934 MRN: [id number]  Primary Cardiologist: None  Clinical pharmacists have reviewed the patient's past medical history, labs, and current medications as part of preoperative protocol coverage. The following recommendations have been made:   Patient with diagnosis of A Fib on Eliquis for anticoagulation.     Procedure: EGD Date of procedure: 11/16/23     CHA2DS2-VASc Score = 5  This indicates a 7.2% annual risk of stroke. The patient's score is based upon: CHF History: 0 HTN History: 1 Diabetes History: 1 Stroke History: 0 Vascular Disease History: 1 Age Score: 2 Gender Score: 0     CrCl 63 mL/min Platelet count 138K   Per office protocol, patient can hold Eliquis for 2 days prior to procedure.  Please resume Eliquis as soon as possible postprocedure, at the discretion of the surgeon.   I will route this recommendation to the requesting party via Epic fax function and remove from pre-op pool.  Please call with questions.  Joylene Grapes, NP 11/09/2023, 9:29 AM

## 2023-11-10 ENCOUNTER — Other Ambulatory Visit: Payer: Self-pay | Admitting: Student

## 2023-11-10 DIAGNOSIS — I4821 Permanent atrial fibrillation: Secondary | ICD-10-CM

## 2023-11-10 NOTE — Telephone Encounter (Addendum)
Called and informed daughter patient could hold Eliquis 2 days and she said they called and cancelled the EGD yesterday and have not rescheduled yet. I told her if he waits too long to reschedule his EGD he may need another office appointment or another ok to hold his Eliquis depending on the amount of time lapsed. She said he will call back after the new year. This is Dr Derek Mound patient

## 2023-11-11 NOTE — Telephone Encounter (Signed)
Prescription refill request for Eliquis received. Indication: AF Last office visit: 07/07/23  B Strader PA-C Scr: 1.03 on 08/17/23  Epic Age: 89 Weight: 86.1kg  Based on above findings Eliquis 5mg  twice daily is the appropriate dose.  Refill approved.

## 2023-11-15 DIAGNOSIS — E538 Deficiency of other specified B group vitamins: Secondary | ICD-10-CM | POA: Diagnosis not present

## 2023-11-16 ENCOUNTER — Encounter: Payer: Medicare Other | Admitting: Internal Medicine

## 2023-11-29 NOTE — Progress Notes (Signed)
 Patient name: Steve Bailey MRN: [id number] DOB: August 02, 1934 Sex: male  REASON FOR VISIT: 6 month f/u 5.0 cm AAA   HPI: Steve Bailey is a 89 y.o. male with history of diabetes, hypertension, hyperlipidemia, prostate cancer, A. Fib on eliquis , known abdominal aortic aneurysm, coronary disease status post CABG that presents for 6 month follow-up of 5.0 cm AAA.  Most recently seen on 05/18/2023 with concern for 5.5 cm AAA by duplex and was sent for CT.  CT confirm the aneurysm is smaller at 5 cm.  No new abdominal pain.  No new concerns today.    Past Medical History:  Diagnosis Date   AAA (abdominal aortic aneurysm) (HCC) followed by dr laurence   last duplex 06/28/2018 4.6 cm,  asymptomatic   Anticoagulant long-term use    eliquis    Benign localized prostatic hyperplasia with lower urinary tract symptoms (LUTS)    CAD (coronary artery disease) cardiologist-  dr charls   remote CABG in 1997; prior PCI to the LAD i n1990; Nuclear study in February of 2012 normal with an EF of 60%    DDD (degenerative disc disease), cervical    Full dentures    History of radiation therapy 11/24/12-01/18/13   Prostate 78Gy/34fx   HOH (hard of hearing)    both   Hyperlipidemia    Hyperlipidemia    Hypertension    Permanent atrial fibrillation (HCC) 09/2012   followed by dr charls   Phimosis    severe   Proliferative diabetic retinopathy (HCC)    both eyes   Prostate cancer [facility]) urologist-- dr alvaro   dx 09-02-2012-- Stage T2a, Gleason 3+4=7, PSA 8.12, vol 103 cc--- completed external beam radiation 02/ 2014;  recurrent 2015 , started hormone therapy   S/P CABG x 3 1997   Strains to urinate    Type 2 diabetes mellitus treated with insulin  [facility])    endocrinologist-- dr von    Past Surgical History:  Procedure Laterality Date   CARDIAC CATHETERIZATION  01-18-2004   dr tisa   patent grafts, ef 55%, minimal anterior hypokinesis,  severe total occlusion of LCFx and LAD,  severe disease of D2  and segmental RCA   CARDIOVASCULAR STRESS TEST  01/07/2011   normal nuclear study w/ no ischemia/  normal LV function and wall motion,  ef 60%   CARPAL TUNNEL RELEASE Right    CATARACT EXTRACTION W/ INTRAOCULAR LENS  IMPLANT, BILATERAL  left 2006;  right 2010   CIRCUMCISION N/A 07/28/2018   Procedure: CIRCUMCISION ADULT;  Surgeon: [name], MD;  Location: [facility];  Service: Urology;  Laterality: N/A;   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   CORONARY ANGIOPLASTY  1990   LAD   CORONARY ARTERY BYPASS GRAFT  11-1995   shows distal LAD disease, without recurrent symptoms of angina with questionable mild apical ischemia but with normal EF    ESOPHAGOGASTRODUODENOSCOPY (EGD) WITH ESOPHAGEAL DILATION  05/2012   KNEE ARTHROSCOPY Left    TRANSTHORACIC ECHOCARDIOGRAM  10/10/2012   ef 55%, akinesis of the inferolateral wall,  grade 2 diastolic dysfunction/  trivial AR/  mild MR and TR/  mild LAE/  mild RVSF reduced and mild dilated RV    Family History  Problem Relation Age of Onset   Stroke Father    Heart attack Mother    Heart disease Mother    Cancer Sister        lung   Cancer Brother  prostate   Diabetes Brother    Stroke Brother    Stroke Brother    Heart disease Brother    Heart disease Brother    Heart attack Sister    Heart disease Sister    Heart attack Sister    Cancer Sister        breast   Heart disease Sister    Heart attack Sister    Heart disease Sister    Heart disease Sister     SOCIAL HISTORY: Social History   Tobacco Use   Smoking status: Former    Current packs/day: 0.00    Types: Cigarettes    Start date: 10/14/1959    Quit date: 10/14/1979    Years since quitting: 44.1   Smokeless tobacco: Former    Types: Chew    Quit date: 11/23/2012  Substance Use Topics   Alcohol use: No    Allergies  Allergen Reactions   Cephalexin  Nausea And Vomiting   Tramadol  Palpitations    Current Outpatient Medications  Medication Sig  Dispense Refill   acetaminophen  (TYLENOL ) 500 MG tablet Take 1,300 mg by mouth at bedtime.     alfuzosin  (UROXATRAL ) 10 MG 24 hr tablet Take 10 mg by mouth at bedtime. Reported on 12/18/2015     amLODipine  (NORVASC ) 10 MG tablet TAKE 1 TABLET DAILY 90 tablet 3   apixaban  (ELIQUIS ) 5 MG TABS tablet TAKE 1 TABLET TWICE A DAY 180 tablet 1   atorvastatin  (LIPITOR) 40 MG tablet TAKE 1 TABLET DAILY 90 tablet 3   Blood Glucose Monitoring Suppl (FREESTYLE LITE) w/Device KIT 1 Device by Does not apply route 2 (two) times daily. 1 kit 0   Cinnamon 500 MG capsule Take 500 mg by mouth 2 (two) times daily.     Continuous Glucose Sensor (FREESTYLE LIBRE 3 SENSOR) MISC 1 each by Does not apply route every 14 (fourteen) days. 6 each 1   ezetimibe  (ZETIA ) 10 MG tablet Take 1 tablet (10 mg total) by mouth daily. 90 tablet 3   ferrous sulfate 325 (65 FE) MG tablet Take 1 tablet by mouth daily.     finasteride  (PROSCAR ) 5 MG tablet Take 5 mg by mouth every morning. Reported on 12/18/2015     Glucagon  (GVOKE HYPOPEN  1-PACK) 1 MG/0.2ML SOAJ Inject 1 mg into the skin as needed (low blood sugar with impaired consciousness). 0.4 mL 2   glucose blood (FREESTYLE LITE) test strip USE TO TEST TWICE A DAY (Patient not taking: Reported on 11/08/2023) 100 strip 2   Insulin  Lispro Prot & Lispro (HUMALOG  MIX 75/25 KWIKPEN) (75-25) 100 UNIT/ML Kwikpen 12 units with breakfast and 8 units with supper. 30 mL 4   Insulin  Pen Needle (PEN NEEDLES) 31G X 5 MM MISC 1 each by Does not apply route 2 (two) times daily. 180 each 2   lisinopril  (ZESTRIL ) 20 MG tablet TAKE 1 TABLET DAILY 90 tablet 3   metFORMIN  (GLUCOPHAGE -XR) 750 MG 24 hr tablet Take 1 tablet (750 mg total) by mouth 2 (two) times daily with a meal. 180 tablet 3   nitroGLYCERIN  (NITROSTAT ) 0.4 MG SL tablet Place 1 tablet (0.4 mg total) under the tongue every 5 (five) minutes as needed for chest pain. 25 tablet 3   Omega-3 Fatty Acids (OMEGA-3 FISH OIL PO) Take 1 capsule by mouth  daily.     pantoprazole  (PROTONIX ) 40 MG tablet Take 1 tablet (40 mg total) by mouth daily. 90 tablet 1   prednisoLONE acetate (PRED FORTE) 1 %  ophthalmic suspension PLACE 1 DROP INTO THE RIGHT EYE DAILY.     No current facility-administered medications for this visit.    REVIEW OF SYSTEMS:  [X]  denotes positive finding, [ ]  denotes negative finding Cardiac  Comments:  Chest pain or chest pressure:    Shortness of breath upon exertion:    Short of breath when lying flat:    Irregular heart rhythm:        Vascular    Pain in calf, thigh, or hip brought on by ambulation:    Pain in feet at night that wakes you up from your sleep:     Blood clot in your veins:    Leg swelling:         Pulmonary    Oxygen at home:    Productive cough:     Wheezing:         Neurologic    Sudden weakness in arms or legs:     Sudden numbness in arms or legs:     Sudden onset of difficulty speaking or slurred speech:    Temporary loss of vision in one eye:     Problems with dizziness:         Gastrointestinal    Blood in stool:     Vomited blood:         Genitourinary    Burning when urinating:     Blood in urine:        Psychiatric    Major depression:         Hematologic    Bleeding problems:    Problems with blood clotting too easily:        Skin    Rashes or ulcers:        Constitutional    Fever or chills:      PHYSICAL EXAM: There were no vitals filed for this visit.     GENERAL: The patient is a well-nourished male, in no acute distress. The vital signs are documented above. CARDIAC: There is a regular rate and rhythm.  VASCULAR:  2+ palpable femoral pulse bilaterally No palpable pedal pulses PULMONARY: No respiratory distress. ABDOMEN: Soft and non-tender.  No pain with palpation of aneurysm. MUSCULOSKELETAL: There are no major deformities or cyanosis. NEUROLOGIC: No focal weakness or paresthesias are detected. SKIN: There are no ulcers or rashes  noted. PSYCHIATRIC: The patient has a normal affect.  DATA:   AAA duplex shows aneurysm 5.48 to 5.5 cm over past 6 months by duplex  CTA 05/11/23: 5.0 cm AAA  Assessment/Plan:  89 year old male presents for 13-month follow-up of a known 5 cm AAA last confirmed on CTA approximately 6 months ago.  Discussed that his duplex today shows no interval growth.  6 months ago his aneurysm measured 5.48 cm and we sent him for CT showing his aneurysm is smaller measuring only 5 cm.  Today his duplex measured his aneurysm at the same diameter as 6 months ago.  Discussed with him and daughter that I do not think there is a role for repeating CT unless the duplex shows growth of the aneurysm.  I will see him in 6 months with repeat AAA duplex.  Lonni DOROTHA Gaskins, MD [facility] Office: [phone]

## 2023-11-30 ENCOUNTER — Other Ambulatory Visit (HOSPITAL_COMMUNITY): Payer: Medicare Other

## 2023-11-30 ENCOUNTER — Ambulatory Visit (INDEPENDENT_AMBULATORY_CARE_PROVIDER_SITE_OTHER): Payer: Medicare Other | Admitting: Vascular Surgery

## 2023-11-30 ENCOUNTER — Encounter: Payer: Self-pay | Admitting: Vascular Surgery

## 2023-11-30 ENCOUNTER — Ambulatory Visit (INDEPENDENT_AMBULATORY_CARE_PROVIDER_SITE_OTHER): Payer: Medicare Other

## 2023-11-30 ENCOUNTER — Ambulatory Visit: Payer: Medicare Other | Admitting: Vascular Surgery

## 2023-11-30 VITALS — BP 180/72 | HR 73 | Ht 72.0 in | Wt 190.0 lb

## 2023-11-30 DIAGNOSIS — I7143 Infrarenal abdominal aortic aneurysm, without rupture: Secondary | ICD-10-CM

## 2023-12-02 ENCOUNTER — Other Ambulatory Visit: Payer: Self-pay

## 2023-12-02 DIAGNOSIS — I7143 Infrarenal abdominal aortic aneurysm, without rupture: Secondary | ICD-10-CM

## 2024-01-05 ENCOUNTER — Ambulatory Visit: Payer: Medicare Other | Admitting: Pediatrics

## 2024-01-11 ENCOUNTER — Encounter: Payer: Self-pay | Admitting: Student

## 2024-01-11 ENCOUNTER — Ambulatory Visit: Payer: Medicare Other | Attending: Student | Admitting: Student

## 2024-01-11 VITALS — BP 136/82 | HR 68 | Ht 72.0 in | Wt 194.4 lb

## 2024-01-11 DIAGNOSIS — I25810 Atherosclerosis of coronary artery bypass graft(s) without angina pectoris: Secondary | ICD-10-CM | POA: Diagnosis not present

## 2024-01-11 DIAGNOSIS — I10 Essential (primary) hypertension: Secondary | ICD-10-CM | POA: Insufficient documentation

## 2024-01-11 DIAGNOSIS — E785 Hyperlipidemia, unspecified: Secondary | ICD-10-CM | POA: Insufficient documentation

## 2024-01-11 DIAGNOSIS — I714 Abdominal aortic aneurysm, without rupture, unspecified: Secondary | ICD-10-CM | POA: Insufficient documentation

## 2024-01-11 DIAGNOSIS — I4821 Permanent atrial fibrillation: Secondary | ICD-10-CM | POA: Insufficient documentation

## 2024-01-11 NOTE — Patient Instructions (Signed)
Medication Instructions:   Continue current medication regimen. Make sure you are taking your iron supplement.   Follow-Up:  6 months with [name], PA or Dr. Jenene Slicker  Any Other Special Instructions Will Be Listed Below (If Applicable).   If you need a refill on your cardiac medications before your next appointment, please call your pharmacy.

## 2024-01-11 NOTE — Progress Notes (Signed)
Cardiology Office Note    Date:  01/11/2024  ID:  Steve Bailey, DOB 05/16/1934, MRN [id number] Cardiologist: Previously Dr. Purvis Sheffield  --> will switch to Dr. Jenene Slicker  History of Present Illness:    Steve Bailey is a 89 y.o. male with past medical history of CAD (s/p CABG in 1997, patent grafts by cath in 2005, low-risk NST in 2012 and 12/2020), permanent atrial fibrillation, HTN, HLD, IDDM and AAA (at 5.4 cm by duplex in 03/2023 but 5.0 cm by CTA in 04/2023) who presents to the office today for 20-month follow-up.  He was last examined by myself in 06/2023 and did have baseline dyspnea on exertion but no acute changes in this and denied any chest pain. He was working in [employer] and using his tractor routinely. No changes were made to his cardiac medications and he was continued on Amlodipine 10 mg daily, Eliquis 5 mg twice daily, Atorvastatin 40 mg daily, Zetia 10 mg daily and Lisinopril 20 mg daily.  In talking with the patient and his daughter today, he reports he still remains active around the home but activity has been more limited given the weather. He enjoys riding his tractor and is looking forward to taking care of [employer] plants later this year. He denies any exertional chest pain or progressive dyspnea on exertion. Does report occasional chest discomfort at night which is mostly along his shoulders and his daughter feels like this is due to him slumping in his chair. Pain does improve with positional changes. He denies any specific orthopnea, PND or pitting edema. He remains on Eliquis for anticoagulation with no reports of melena, hematochezia or hematuria.   Studies Reviewed:   EKG: EKG is ordered today and demonstrates:   EKG Interpretation Date/Time:  Tuesday January 11 2024 13:03:10 EST Ventricular Rate:  68 PR Interval:    QRS Duration:  78 QT Interval:  404 QTC Calculation: 429 R Axis:   -16  Text Interpretation: Atrial fibrillation Minimal voltage  criteria for LVH, may be normal variant ( R in aVL ) Inferior infarct , age undetermined - Noted on prior tracings. Confirmed by [name] ([id number]) on 01/11/2024 1:06:01 PM       NST: 12/2020 Blood pressure demonstrated a normal response to exercise. There was no ST segment deviation noted during stress.   Normal resting and stress perfusion. No ischemia or infarction EF 59% underlying rhythm afib    AAA Duplex:11/2023 Summary:  Abdominal Aorta: There is evidence of abnormal dilatation of the mid  Abdominal aorta. Previous diameter measurement was obtained on 04/06/23:  5.48 x 5.28 cm.   Risk Assessment/Calculations:    CHA2DS2-VASc Score = 5   This indicates a 7.2% annual risk of stroke. The patient's score is based upon: CHF History: 0 HTN History: 1 Diabetes History: 1 Stroke History: 0 Vascular Disease History: 1 Age Score: 2 Gender Score: 0    Physical Exam:   VS:  BP 136/82   Pulse 68   Ht 6' (1.829 m)   Wt 194 lb 6.4 oz (88.2 kg)   SpO2 98%   BMI 26.37 kg/m    Wt Readings from Last 3 Encounters:  01/11/24 194 lb 6.4 oz (88.2 kg)  11/30/23 190 lb (86.2 kg)  11/08/23 194 lb (88 kg)     GEN: Pleasant, elderly male appearing in no acute distress NECK: No JVD; No carotid bruits CARDIAC: Irregularly irregular, no murmurs, rubs, gallops RESPIRATORY:  Clear to auscultation without rales, wheezing  or rhonchi  ABDOMEN: Appears non-distended. No obvious abdominal masses. EXTREMITIES: No clubbing or cyanosis. No pitting edema.  Distal pedal pulses are 2+ bilaterally.   Assessment and Plan:   1.  CAD - He underwent CABG in 1997 and most recent ischemic evaluation was a low-risk NST in 12/2020 which showed no evidence of ischemia. His episodes of pain as described above overall seem atypical for a cardiac etiology as they occur at rest and resolve with positional changes. Encouraged him to make [place] aware if he develops any exertional symptoms as we could  arrange for a repeat Lexiscan Myoview.   - Continue current medications with Atorvastatin 40 mg daily, Zetia 10 mg daily and Lisinopril 20 mg daily. He is no longer on ASA given the need for anticoagulation.   2. Permanent Atrial Fibrillation - He denies any recent palpitations and heart rate is well-controlled in the 60's during today's visit. He is no longer on AV nodal blocking agents. - Remains on Eliquis 5 mg twice daily for anticoagulation which is the appropriate dose given his age, weight and renal function (creatinine at 1.03 when checked in 07/2023).  3. AAA - Measured 5 cm by most recent imaging and he is followed by Vascular Surgery with plans for follow-up imaging in 6 months by review of notes from 11/2023.  4. HTN - Blood pressure is well-controlled at 136/82 during today's visit which is acceptable given his age. Continue Amlodipine 10 mg daily and Lisinopril 20 mg daily.  5. HLD - Followed by his PCP. LDL had improved to 41 when checked in 07/2023. Continue current medical therapy with Atorvastatin 40 mg daily and Zetia 10 mg daily.  Signed, Ellsworth Lennox, PA-C

## 2024-02-01 ENCOUNTER — Telehealth: Payer: Self-pay

## 2024-02-01 ENCOUNTER — Ambulatory Visit (INDEPENDENT_AMBULATORY_CARE_PROVIDER_SITE_OTHER): Payer: Medicare Other | Admitting: Endocrinology

## 2024-02-01 ENCOUNTER — Encounter: Payer: Self-pay | Admitting: Endocrinology

## 2024-02-01 ENCOUNTER — Other Ambulatory Visit: Payer: Self-pay | Admitting: Physician Assistant

## 2024-02-01 VITALS — BP 126/80 | HR 83 | Resp 20 | Ht 72.0 in | Wt 196.0 lb

## 2024-02-01 DIAGNOSIS — E1169 Type 2 diabetes mellitus with other specified complication: Secondary | ICD-10-CM

## 2024-02-01 DIAGNOSIS — Z794 Long term (current) use of insulin: Secondary | ICD-10-CM | POA: Diagnosis not present

## 2024-02-01 LAB — POCT GLYCOSYLATED HEMOGLOBIN (HGB A1C): Hemoglobin A1C: 6 % — AB (ref 4.0–5.6)

## 2024-02-01 NOTE — Progress Notes (Signed)
 Outpatient Endocrinology Note [place] Johannah Rozas, MD  02/01/24  Patient's Name: Steve Bailey    DOB: 1934/09/03    MRN: [id number]                                                    REASON OF VISIT: Follow up of type 2 diabetes mellitus  PCP: [facility]  HISTORY OF PRESENT ILLNESS:        DALEON WILLINGER is a 89 y.o. old male with past medical history listed below, is here for follow up for type 2 diabetes mellitus.   Pertinent Diabetes History: Patient was diagnosed with type 2 diabetes mellitus in 1995.  He is on insulin therapy and has controlled type 2 diabetes mellitus.  Chronic Diabetes Complications : Retinopathy: yes, proliferative diabetic retinopathy bilateral eyes. Following with ophthalmology regularly, [facility].  He has corneal dystrophy and macular edema as well as retinopathy. Nephropathy: no, on ACE/ARB /lisinopril. Peripheral neuropathy: yes, numbness / .  Coronary artery disease: yes, s/p CABG in 1997.  Stroke: no  Relevant comorbidities and cardiovascular risk factors: Obesity: no Body mass index is 26.58 kg/m.  Hypertension: Yes  Hyperlipidemia : Yes, on statin   Current / Home Diabetic regimen includes: Humalog mix 75/25 KwikPen 15 units with breakfast- 10 units with supper.  Prior diabetic medications: Metformin extended release was stopped due to abdominal discomfort.  Glycemic data:    CONTINUOUS GLUCOSE MONITORING SYSTEM (CGMS) INTERPRETATION: At today's visit, we reviewed CGM downloads. The full report is scanned in the media. Reviewing the CGM trends, blood glucose are as follows:  FreeStyle Libre 3 CGM-  Sensor Download (Sensor download was reviewed and summarized below.) Dates: February 26 to February 01, 2024, 14 days. Sensor Average: 140  Glucose Management Indicator: 6.7%  % data captured: 95%     Interpretation: Mostly acceptable blood sugar with occasional mild hyperglycemia with blood sugar in 60s in  the morning and sometime overnight.  Rare hypoglycemia in the evening.  Significantly less hypoglycemia compared to previous 2 visits.  Rare hyperglycemia related with meals up to 300 range.  No concerning hypoglycemia.  Hypoglycemia: Patient has minor hypoglycemic episodes. Patient has hypoglycemia awareness.  Factors modifying glucose control: 1.  Diabetic diet assessment: 3 meals a day.  2.  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  3.  Medication compliance: compliant all of the time.  Interval history  Patient is no longer taking metformin, complains of abdominal pain and he stopped taking it.  He has been instead taking over-the-counter chromium.  Diabetes regimen has been reviewed and as noted above, he is taking Humalog mix 15 units in the morning and 10 units in the evening despite planned to decrease the dose in last visit.  CGM data as reviewed above.  Still with random hypoglycemia in the early morning and overnight.  Hemoglobin A1c 6%.  He has complaints of tingling of the feet otherwise no complaints today.  He is accompanied by daughter in the clinic today.  Patient and daughter has question about if insulin pump would be appropriate for him.  Discussed about how pumps works, it still require involvement of the patient actively including insulin bolusing for meals.  At this time he has been requiring help from the daughter to change sensor site.  Discussed  that probably the current regimen of insulin twice a day would be the best option for him.  Patient and daughter agreed not to consider for insulin pump therapy at this time.   REVIEW OF SYSTEMS As per history of present illness.   PAST MEDICAL HISTORY: Past Medical History:  Diagnosis Date   AAA (abdominal aortic aneurysm) (HCC) followed by dr Imogene Burn   last duplex 06/28/2018 4.6 cm,  asymptomatic   Anticoagulant long-term use    eliquis   Benign localized prostatic hyperplasia with lower urinary tract symptoms  (LUTS)    CAD (coronary artery disease)    s/p CABG in 1997, patent grafts by cath in 2005, low-risk NST in 2012 and 12/2020   DDD (degenerative disc disease), cervical    Full dentures    History of radiation therapy 11/24/12-01/18/13   Prostate 78Gy/56fx   HOH (hard of hearing)    both   Hyperlipidemia    Hyperlipidemia    Hypertension    Permanent atrial fibrillation (HCC) 09/2012   Phimosis    severe   Proliferative diabetic retinopathy (HCC)    both eyes   Prostate cancer [facility]) urologist-- dr Berneice Heinrich   dx 09-02-2012-- Stage T2a, Gleason 3+4=7, PSA 8.12, vol 103 cc--- completed external beam radiation 02/ 2014;  recurrent 2015 , started hormone therapy   S/P CABG x 3 1997   Strains to urinate    Type 2 diabetes mellitus treated with insulin [facility])    endocrinologist-- dr Lucianne Muss    PAST SURGICAL HISTORY: Past Surgical History:  Procedure Laterality Date   CARDIAC CATHETERIZATION  01-18-2004   dr Deborah Chalk   patent grafts, ef 55%, minimal anterior hypokinesis,  severe total occlusion of LCFx and LAD,  severe disease of D2 and segmental RCA   CARDIOVASCULAR STRESS TEST  01/07/2011   normal nuclear study w/ no ischemia/  normal LV function and wall motion,  ef 60%   CARPAL TUNNEL RELEASE Right    CATARACT EXTRACTION W/ INTRAOCULAR LENS  IMPLANT, BILATERAL  left 2006;  right 2010   CIRCUMCISION N/A 07/28/2018   Procedure: CIRCUMCISION ADULT;  Surgeon: Sebastian Ache, MD;  Location: [facility];  Service: Urology;  Laterality: N/A;   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   CORONARY ANGIOPLASTY  1990   LAD   CORONARY ARTERY BYPASS GRAFT  11-1995   shows distal LAD disease, without recurrent symptoms of angina with questionable mild apical ischemia but with normal EF    ESOPHAGOGASTRODUODENOSCOPY (EGD) WITH ESOPHAGEAL DILATION  05/2012   KNEE ARTHROSCOPY Left    TRANSTHORACIC ECHOCARDIOGRAM  10/10/2012   ef 55%, akinesis of the inferolateral wall,  grade 2 diastolic  dysfunction/  trivial AR/  mild MR and TR/  mild LAE/  mild RVSF reduced and mild dilated RV    ALLERGIES: Allergies  Allergen Reactions   Cephalexin Nausea And Vomiting   Tramadol Palpitations    FAMILY HISTORY:  Family History  Problem Relation Age of Onset   Stroke Father    Heart attack Mother    Heart disease Mother    Cancer Sister        lung   Cancer Brother        prostate   Diabetes Brother    Stroke Brother    Stroke Brother    Heart disease Brother    Heart disease Brother    Heart attack Sister    Heart disease Sister    Heart attack Sister    Cancer Sister  breast   Heart disease Sister    Heart attack Sister    Heart disease Sister    Heart disease Sister     SOCIAL HISTORY: Social History   Socioeconomic History   Marital status: Widowed    Spouse name: Not on file   Number of children: 1   Years of education: Not on file   Highest education level: Not on file  Occupational History   Not on file  Tobacco Use   Smoking status: Former    Current packs/day: 0.00    Types: Cigarettes    Start date: 10/14/1959    Quit date: 10/14/1979    Years since quitting: 44.3   Smokeless tobacco: Former    Types: Chew    Quit date: 11/23/2012  Vaping Use   Vaping status: Never Used  Substance and Sexual Activity   Alcohol use: No   Drug use: No   Sexual activity: Not on file  Other Topics Concern   Not on file  Social History Narrative   Not on file   Social Drivers of Health   Financial Resource Strain: Not on file  Food Insecurity: Low Risk  (08/03/2023)   Received from [facility]   Hunger Vital Sign    Worried About Running Out of Food in the Last Year: Never true    Ran Out of Food in the Last Year: Never true  Transportation Needs: No Transportation Needs (08/03/2023)   Received from [facility]    In the past 12 months, has lack of reliable transportation kept you from medical appointments, meetings, work or  from getting things needed for daily living? : No  Physical Activity: Not on file  Stress: Not on file  Social Connections: Not on file    MEDICATIONS:  Current Outpatient Medications  Medication Sig Dispense Refill   acetaminophen (TYLENOL) 500 MG tablet Take 1,300 mg by mouth at bedtime.     alfuzosin (UROXATRAL) 10 MG 24 hr tablet Take 10 mg by mouth at bedtime. Reported on 12/18/2015     amLODipine (NORVASC) 10 MG tablet TAKE 1 TABLET DAILY 90 tablet 3   apixaban (ELIQUIS) 5 MG TABS tablet TAKE 1 TABLET TWICE A DAY 180 tablet 1   atorvastatin (LIPITOR) 40 MG tablet TAKE 1 TABLET DAILY 90 tablet 3   Blood Glucose Monitoring Suppl (FREESTYLE LITE) w/Device KIT 1 Device by Does not apply route 2 (two) times daily. 1 kit 0   Cinnamon 500 MG capsule Take 500 mg by mouth 2 (two) times daily.     Continuous Glucose Sensor (FREESTYLE LIBRE 3 SENSOR) MISC 1 each by Does not apply route every 14 (fourteen) days. 6 each 1   ferrous sulfate 325 (65 FE) MG tablet Take 1 tablet by mouth daily.     finasteride (PROSCAR) 5 MG tablet Take 5 mg by mouth every morning. Reported on 12/18/2015     Glucagon (GVOKE HYPOPEN 1-PACK) 1 MG/0.2ML SOAJ Inject 1 mg into the skin as needed (low blood sugar with impaired consciousness). 0.4 mL 2   glucose blood (FREESTYLE LITE) test strip USE TO TEST TWICE A DAY 100 strip 2   Insulin Lispro Prot & Lispro (HUMALOG MIX 75/25 KWIKPEN) (75-25) 100 UNIT/ML Kwikpen 12 units with breakfast and 8 units with supper. 30 mL 4   Insulin Pen Needle (PEN NEEDLES) 31G X 5 MM MISC 1 each by Does not apply route 2 (two) times daily. 180 each 2  lisinopril (ZESTRIL) 20 MG tablet TAKE 1 TABLET DAILY 90 tablet 3   nitroGLYCERIN (NITROSTAT) 0.4 MG SL tablet Place 1 tablet (0.4 mg total) under the tongue every 5 (five) minutes as needed for chest pain. 25 tablet 3   Omega-3 Fatty Acids (OMEGA-3 FISH OIL PO) Take 1 capsule by mouth daily.     pantoprazole (PROTONIX) 40 MG tablet Take 1  tablet (40 mg total) by mouth daily. 90 tablet 1   prednisoLONE acetate (PRED FORTE) 1 % ophthalmic suspension PLACE 1 DROP INTO THE RIGHT EYE DAILY.     ezetimibe (ZETIA) 10 MG tablet Take 1 tablet (10 mg total) by mouth daily. 90 tablet 3   metFORMIN (GLUCOPHAGE-XR) 750 MG 24 hr tablet Take 1 tablet (750 mg total) by mouth 2 (two) times daily with a meal. (Patient not taking: Reported on 02/01/2024) 180 tablet 3   No current facility-administered medications for this visit.    PHYSICAL EXAM: Vitals:   02/01/24 1509  BP: 126/80  Pulse: 83  Resp: 20  SpO2: 95%  Weight: 196 lb (88.9 kg)  Height: 6' (1.829 m)    Body mass index is 26.58 kg/m.  Wt Readings from Last 3 Encounters:  02/01/24 196 lb (88.9 kg)  01/11/24 194 lb 6.4 oz (88.2 kg)  11/30/23 190 lb (86.2 kg)    General: Well developed, well nourished male in no apparent distress.  HEENT: AT/[place], no external lesions. Hearing impairment.  Eyes: Conjunctiva clear and no icterus. Neck: Neck supple  Lungs: Respirations not labored Neurologic: Alert, oriented, normal speech Extremities / Skin: Dry.   Psychiatric: Does not appear depressed or anxious  Diabetic Foot Exam - Simple   No data filed    LABS Reviewed Lab Results  Component Value Date   HGBA1C 6.0 (A) 02/01/2024   HGBA1C 5.6 09/20/2023   HGBA1C 6.0 (A) 03/12/2023   Lab Results  Component Value Date   FRUCTOSAMINE 251 04/27/2019   Lab Results  Component Value Date   CHOL 150 01/07/2023   HDL 51.80 01/07/2023   LDLCALC 78 01/07/2023   TRIG 101.0 01/07/2023   CHOLHDL 3 01/07/2023   Lab Results  Component Value Date   MICRALBCREAT 4.2 05/11/2023   MICRALBCREAT 8.9 01/28/2022   Lab Results  Component Value Date   CREATININE 1.20 05/11/2023   Lab Results  Component Value Date   GFR 50.61 (L) 01/07/2023    ASSESSMENT / PLAN  1. Type 2 diabetes mellitus with other specified complication, with long-term current use of insulin (HCC)       Diabetes Mellitus type 2, complicated by diabetic retinopathy and neuropathy. - Diabetic status / severity: Controlled with occasional hypoglycemia.  Lab Results  Component Value Date   HGBA1C 6.0 (A) 02/01/2024    - Hemoglobin A1c goal : <7.5%  It is okay to have permissive hyperglycemia with hemoglobin A1c close to 7 and half percent, to avoid hypoglycemia.  Hypoglycemia has decreased after gradually decreasing dose of insulin.   Medications: See below.  Decrease the dose of evening insulin from 10 to 8 units as follows.  Diabetes regimen: Decrease Humalog mix 75/25 insulin take 15 units with breakfast and 8 units with supper.  Advised to take less Humalog in the morning when there is anticipated increased physical activity or skipping meal in the afternoon.  - Home glucose testing: CGM Freestyle libre 3 and check as needed. - Discussed/ Gave Hypoglycemia treatment plan.  Always carry glucose and take glucose tablets to correct  hypoglycemia may take 2 to 4 tablets at a time.  Glucagon Emergency Kit prescribed.  Discussed about optimal glucose around 100-150 range in the morning fasting and in between the meals and up to 200 range after meals.  # Consult : not required at this time.   # Annual urine for microalbuminuria/ creatinine ratio, no microalbuminuria currently. Last  Lab Results  Component Value Date   MICRALBCREAT 4.2 05/11/2023    # Foot check nightly / neuropathy.  Offered podiatry referral, patient declined.  # Patient had diabetic retinopathy, regularly following [facility].  2. Blood pressure  -  BP Readings from Last 1 Encounters:  02/01/24 126/80    - Control is in target.  - No change in current plans.    3. Lipid status / Hyperlipidemia - Last  Lab Results  Component Value Date   LDLCALC 78 01/07/2023   - Continue atorvastatin 40 mg daily.  Managed by PCP/cardiology.  Diagnoses and all orders for this  visit:  Type 2 diabetes mellitus with other specified complication, with long-term current use of insulin (HCC) -     POCT glycosylated hemoglobin (Hb A1C)     DISPOSITION Follow up in clinic in 3 months suggested.   All questions answered and patient verbalized understanding of the plan.  [place] Siriah Treat, MD [facility]ocr[facility] [address] [place], [address] [place], [place] [address] Phone # [phone]  At least part of this note was generated using voice recognition software. Inadvertent word errors may have occurred, which were not recognized during the proofreading process.

## 2024-02-01 NOTE — Patient Instructions (Signed)
 Diabetes regimen: Decrease Humalog mix 75/25 take 15 units with breakfast and 8 units with supper

## 2024-02-01 NOTE — Telephone Encounter (Signed)
 Patient requesting during office visit that chart notes be sent to [employer]. Notes faxed 02/01/2024

## 2024-02-02 ENCOUNTER — Encounter: Payer: Self-pay | Admitting: Endocrinology

## 2024-02-02 NOTE — Telephone Encounter (Signed)
 This is a [place] pt.

## 2024-02-21 ENCOUNTER — Other Ambulatory Visit: Payer: Self-pay | Admitting: Physician Assistant

## 2024-03-08 ENCOUNTER — Telehealth: Payer: Self-pay

## 2024-03-08 NOTE — Telephone Encounter (Signed)
 Patient called and Mychart message sent to patient with instructions: Per MD labs can be rechecked during upcoming office visit

## 2024-03-30 NOTE — Progress Notes (Signed)
 Please let patient's daughter know that his kidney function has improved and is close to baseline.  Call for any questions/concerns.

## 2024-04-13 ENCOUNTER — Other Ambulatory Visit: Payer: Self-pay | Admitting: Physician Assistant

## 2024-05-01 ENCOUNTER — Encounter: Payer: Self-pay | Admitting: Endocrinology

## 2024-05-01 ENCOUNTER — Ambulatory Visit (INDEPENDENT_AMBULATORY_CARE_PROVIDER_SITE_OTHER): Admitting: Endocrinology

## 2024-05-01 ENCOUNTER — Ambulatory Visit: Payer: Self-pay | Admitting: Endocrinology

## 2024-05-01 VITALS — BP 122/70 | HR 85 | Resp 16 | Ht 72.0 in | Wt 201.8 lb

## 2024-05-01 DIAGNOSIS — Z794 Long term (current) use of insulin: Secondary | ICD-10-CM

## 2024-05-01 DIAGNOSIS — E1169 Type 2 diabetes mellitus with other specified complication: Secondary | ICD-10-CM

## 2024-05-01 LAB — POCT GLYCOSYLATED HEMOGLOBIN (HGB A1C): Hemoglobin A1C: 6.3 % — AB (ref 4.0–5.6)

## 2024-05-01 NOTE — Patient Instructions (Signed)
 Diabetes regimen: Decrease Humalog  mix 75/25 take 15 units with breakfast and 6 units with supper

## 2024-05-01 NOTE — Progress Notes (Signed)
 Outpatient Endocrinology Note Steve Magan Winnett, MD  05/01/24  Patient's Name: Steve Bailey    DOB: 10-21-1934    MRN: [id number]                                                    REASON OF VISIT: Follow up of type 2 diabetes mellitus  PCP: [facility]  HISTORY OF PRESENT ILLNESS:        Steve Bailey is a 89 y.o. old male with past medical history listed below, is here for follow up for type 2 diabetes mellitus.   Pertinent Diabetes History: Patient was diagnosed with type 2 diabetes mellitus in 1995.  He is on insulin  therapy and has controlled type 2 diabetes mellitus.  Chronic Diabetes Complications : Retinopathy: yes, proliferative diabetic retinopathy bilateral eyes. Following with ophthalmology regularly, [facility].  He has corneal dystrophy and macular edema as well as retinopathy. Nephropathy: no, on ACE/ARB /lisinopril . Peripheral neuropathy: yes, numbness / .  Coronary artery disease: yes, s/p CABG in 1997.  Stroke: no  Relevant comorbidities and cardiovascular risk factors: Obesity: no Body mass index is 27.37 kg/m.  Hypertension: Yes  Hyperlipidemia : Yes, on statin   Current / Home Diabetic regimen includes: Humalog  mix 75/25 KwikPen 15 units with breakfast- 8 or 10 units with supper.  Prior diabetic medications: Metformin  extended release was stopped due to abdominal discomfort.  Glycemic data:    CONTINUOUS GLUCOSE MONITORING SYSTEM (CGMS) INTERPRETATION: At today's visit, we reviewed CGM downloads. The full report is scanned in the media. Reviewing the CGM trends, blood glucose are as follows:  FreeStyle Libre 3 CGM-  Sensor Download (Sensor download was reviewed and summarized below.) Dates: May 27 to June 9 , 2025, 14 days. Sensor Average: 142  Glucose Management Indicator: 6.7%  % data captured: 96%     Interpretation: Mostly acceptable blood sugar with random mild hypoglycemia with blood sugar in 60s  occasionally overnight and rarely in the early evening.  Rare mild hyperglycemia with blood sugar over 300 with supper related to high carb meal.  Blood sugar mostly low normal range overnight.  Hypoglycemia: Patient has minor hypoglycemic episodes. Patient has hypoglycemia awareness.  Factors modifying glucose control: 1.  Diabetic diet assessment: 3 meals a day.  2.  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  3.  Medication compliance: compliant all of the time.  # Patient and daughter has question about if insulin  pump would be appropriate for him.  Discussed about how pumps works, it still require involvement of the patient actively including insulin  bolusing for meals.  At this time he has been requiring help from the daughter to change sensor site.  Discussed that probably the current regimen of insulin  twice a day would be the best option for him.  Patient and daughter agreed not to consider for insulin  pump therapy at this time.  As discussed in visit in March 2025.  Interval history  Diabetes regimen as reviewed and noted above.  She is currently on insulin .  CGM data as reviewed above.  Hemoglobin A1c 6.3%.  Overnight blood sugar mostly low normal or low occasional hypoglycemia.  He is accompanied by daughter in the clinic today.  No other complaints today.   REVIEW OF SYSTEMS As per history of present  illness.   PAST MEDICAL HISTORY: Past Medical History:  Diagnosis Date   AAA (abdominal aortic aneurysm) (HCC) followed by dr Farrel Hones   last duplex 06/28/2018 4.6 cm,  asymptomatic   Anticoagulant long-term use    eliquis    Benign localized prostatic hyperplasia with lower urinary tract symptoms (LUTS)    CAD (coronary artery disease)    s/p CABG in 1997, patent grafts by cath in 2005, low-risk NST in 2012 and 12/2020   DDD (degenerative disc disease), cervical    Full dentures    History of radiation therapy 11/24/12-01/18/13   Prostate 78Gy/80fx   HOH (hard of  hearing)    both   Hyperlipidemia    Hyperlipidemia    Hypertension    Permanent atrial fibrillation (HCC) 09/2012   Phimosis    severe   Proliferative diabetic retinopathy (HCC)    both eyes   Prostate cancer [facility]) urologist-- dr Secundino Dach   dx 09-02-2012-- Stage T2a, Gleason 3+4=7, PSA 8.12, vol 103 cc--- completed external beam radiation 02/ 2014;  recurrent 2015 , started hormone therapy   S/P CABG x 3 1997   Strains to urinate    Type 2 diabetes mellitus treated with insulin  [facility])    endocrinologist-- dr Hubert Madden    PAST SURGICAL HISTORY: Past Surgical History:  Procedure Laterality Date   CARDIAC CATHETERIZATION  01-18-2004   dr Ollis Bi   patent grafts, ef 55%, minimal anterior hypokinesis,  severe total occlusion of LCFx and LAD,  severe disease of D2 and segmental RCA   CARDIOVASCULAR STRESS TEST  01/07/2011   normal nuclear study w/ no ischemia/  normal LV function and wall motion,  ef 60%   CARPAL TUNNEL RELEASE Right    CATARACT EXTRACTION W/ INTRAOCULAR LENS  IMPLANT, BILATERAL  left 2006;  right 2010   CIRCUMCISION N/A 07/28/2018   Procedure: CIRCUMCISION ADULT;  Surgeon: Osborn Blaze, MD;  Location: [facility];  Service: Urology;  Laterality: N/A;   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   CORONARY ANGIOPLASTY  1990   LAD   CORONARY ARTERY BYPASS GRAFT  11-1995   shows distal LAD disease, without recurrent symptoms of angina with questionable mild apical ischemia but with normal EF    ESOPHAGOGASTRODUODENOSCOPY (EGD) WITH ESOPHAGEAL DILATION  05/2012   KNEE ARTHROSCOPY Left    TRANSTHORACIC ECHOCARDIOGRAM  10/10/2012   ef 55%, akinesis of the inferolateral wall,  grade 2 diastolic dysfunction/  trivial AR/  mild MR and TR/  mild LAE/  mild RVSF reduced and mild dilated RV    ALLERGIES: Allergies  Allergen Reactions   Cephalexin  Nausea And Vomiting   Tramadol  Palpitations    FAMILY HISTORY:  Family History  Problem Relation Age of Onset   Stroke  Father    Heart attack Mother    Heart disease Mother    Cancer Sister        lung   Cancer Brother        prostate   Diabetes Brother    Stroke Brother    Stroke Brother    Heart disease Brother    Heart disease Brother    Heart attack Sister    Heart disease Sister    Heart attack Sister    Cancer Sister        breast   Heart disease Sister    Heart attack Sister    Heart disease Sister    Heart disease Sister     SOCIAL HISTORY: Social History   Socioeconomic  History   Marital status: Widowed    Spouse name: Not on file   Number of children: 1   Years of education: Not on file   Highest education level: Not on file  Occupational History   Not on file  Tobacco Use   Smoking status: Former    Current packs/day: 0.00    Types: Cigarettes    Start date: 10/14/1959    Quit date: 10/14/1979    Years since quitting: 44.5   Smokeless tobacco: Former    Types: Chew    Quit date: 11/23/2012  Vaping Use   Vaping status: Never Used  Substance and Sexual Activity   Alcohol use: No   Drug use: No   Sexual activity: Not on file  Other Topics Concern   Not on file  Social History Narrative   Not on file   Social Drivers of Health   Financial Resource Strain: Not on file  Food Insecurity: Low Risk  (03/22/2024)   Received from [facility]   Hunger Vital Sign    Worried About Running Out of Food in the Last Year: Never true    Ran Out of Food in the Last Year: Never true  Transportation Needs: No Transportation Needs (03/22/2024)   Received from [facility]    In the past 12 months, has lack of reliable transportation kept you from medical appointments, meetings, work or from getting things needed for daily living? : No  Physical Activity: Not on file  Stress: Not on file  Social Connections: Not on file    MEDICATIONS:  Current Outpatient Medications  Medication Sig Dispense Refill   acetaminophen  (TYLENOL ) 500 MG tablet Take 1,300 mg by  mouth at bedtime.     alfuzosin (UROXATRAL) 10 MG 24 hr tablet Take 10 mg by mouth at bedtime. Reported on 12/18/2015     amLODipine  (NORVASC ) 10 MG tablet TAKE 1 TABLET DAILY 90 tablet 3   apixaban  (ELIQUIS ) 5 MG TABS tablet TAKE 1 TABLET TWICE A DAY 180 tablet 1   atorvastatin  (LIPITOR) 40 MG tablet TAKE 1 TABLET DAILY 90 tablet 3   Blood Glucose Monitoring Suppl (FREESTYLE LITE) w/Device KIT 1 Device by Does not apply route 2 (two) times daily. 1 kit 0   Cinnamon 500 MG capsule Take 500 mg by mouth 2 (two) times daily.     Continuous Glucose Sensor (FREESTYLE LIBRE 3 SENSOR) MISC 1 each by Does not apply route every 14 (fourteen) days. 6 each 1   ezetimibe  (ZETIA ) 10 MG tablet TAKE 1 TABLET DAILY 90 tablet 3   ferrous sulfate 325 (65 FE) MG tablet Take 1 tablet by mouth daily.     finasteride (PROSCAR) 5 MG tablet Take 5 mg by mouth every morning. Reported on 12/18/2015     Glucagon  (GVOKE HYPOPEN  1-PACK) 1 MG/0.2ML SOAJ Inject 1 mg into the skin as needed (low blood sugar with impaired consciousness). 0.4 mL 2   glucose blood (FREESTYLE LITE) test strip USE TO TEST TWICE A DAY 100 strip 2   Insulin  Lispro Prot & Lispro (HUMALOG  MIX 75/25 KWIKPEN) (75-25) 100 UNIT/ML Kwikpen 12 units with breakfast and 8 units with supper. 30 mL 4   Insulin  Pen Needle (PEN NEEDLES) 31G X 5 MM MISC 1 each by Does not apply route 2 (two) times daily. 180 each 2   lisinopril  (ZESTRIL ) 20 MG tablet TAKE 1 TABLET DAILY 90 tablet 3   nitroGLYCERIN  (NITROSTAT ) 0.4 MG SL tablet Place  1 tablet (0.4 mg total) under the tongue every 5 (five) minutes as needed for chest pain. 25 tablet 3   Omega-3 Fatty Acids (OMEGA-3 FISH OIL PO) Take 1 capsule by mouth daily.     pantoprazole  (PROTONIX ) 40 MG tablet TAKE 1 TABLET DAILY 90 tablet 3   prednisoLONE acetate (PRED FORTE) 1 % ophthalmic suspension PLACE 1 DROP INTO THE RIGHT EYE DAILY.     metFORMIN  (GLUCOPHAGE -XR) 750 MG 24 hr tablet Take 1 tablet (750 mg total) by mouth 2  (two) times daily with a meal. (Patient not taking: Reported on 05/01/2024) 180 tablet 3   No current facility-administered medications for this visit.    PHYSICAL EXAM: Vitals:   05/01/24 1417  BP: 122/70  Pulse: 85  Resp: 16  SpO2: 98%  Weight: 201 lb 12.8 oz (91.5 kg)  Height: 6' (1.829 m)    Body mass index is 27.37 kg/m.  Wt Readings from Last 3 Encounters:  05/01/24 201 lb 12.8 oz (91.5 kg)  02/01/24 196 lb (88.9 kg)  01/11/24 194 lb 6.4 oz (88.2 kg)    General: Well developed, well nourished male in no apparent distress.  HEENT: AT/[place], no external lesions. Hearing impairment.  Eyes: Conjunctiva clear and no icterus. Neck: Neck supple  Lungs: Respirations not labored Neurologic: Alert, oriented, normal speech Extremities / Skin: Dry.   Psychiatric: Does not appear depressed or anxious  Diabetic Foot Exam - Simple   No data filed    LABS Reviewed Lab Results  Component Value Date   HGBA1C 6.3 (A) 05/01/2024   HGBA1C 6.0 (A) 02/01/2024   HGBA1C 5.6 09/20/2023   Lab Results  Component Value Date   FRUCTOSAMINE 251 04/27/2019   Lab Results  Component Value Date   CHOL 150 01/07/2023   HDL 51.80 01/07/2023   LDLCALC 78 01/07/2023   TRIG 101.0 01/07/2023   CHOLHDL 3 01/07/2023   Lab Results  Component Value Date   MICRALBCREAT 4.2 05/11/2023   MICRALBCREAT 8.9 01/28/2022   Lab Results  Component Value Date   CREATININE 1.20 05/11/2023   Lab Results  Component Value Date   GFR 50.61 (L) 01/07/2023    ASSESSMENT / PLAN  1. Type 2 diabetes mellitus with other specified complication, with long-term current use of insulin  (HCC)      Diabetes Mellitus type 2, complicated by diabetic retinopathy and neuropathy. - Diabetic status / severity: Controlled with occasional hypoglycemia.  Lab Results  Component Value Date   HGBA1C 6.3 (A) 05/01/2024    - Hemoglobin A1c goal : <7.5%  It is okay to have permissive hyperglycemia with hemoglobin A1c  close to 7.5%, to avoid hypoglycemia.  Hypoglycemia has decreased after gradually decreasing dose of insulin .   Medications: See below.  Decrease the dose of evening insulin  from 10 to 8 units as follows.  Diabetes regimen: Decrease Humalog  mix 75/25 insulin  take 15 units with breakfast and 6 units with supper.  Advised to take less Humalog  in the morning when there is anticipated increased physical activity or skipping meal in the afternoon.  - Home glucose testing: CGM Freestyle libre 3 and check as needed.  - Discussed/ Gave Hypoglycemia treatment plan.  Always carry glucose and take glucose tablets to correct hypoglycemia may take 2 to 4 tablets at a time.  Glucagon  Emergency Kit prescribed.  Discussed about optimal glucose around 100-150 range in the morning fasting and in between the meals and up to 200 range after meals.  # Consult :  not required at this time.   # Annual urine for microalbuminuria/ creatinine ratio, no microalbuminuria currently.  Will check today along with BMP with EGFR. Last  Lab Results  Component Value Date   MICRALBCREAT 4.2 05/11/2023    # Foot check nightly / neuropathy.  Offered podiatry referral, patient declined.  # Patient had diabetic retinopathy, regularly following with [facility]mology at [facility].  2. Blood pressure  -  BP Readings from Last 1 Encounters:  05/01/24 122/70    - Control is in target.  - No change in current plans.    3. Lipid status / Hyperlipidemia - Last  Lab Results  Component Value Date   LDLCALC 78 01/07/2023   - Continue atorvastatin  40 mg daily.  Managed by PCP/cardiology.  Diagnoses and all orders for this visit:  Type 2 diabetes mellitus with other specified complication, with long-term current use of insulin  (HCC) -     POCT glycosylated hemoglobin (Hb A1C)   DISPOSITION Follow up in clinic in 3 months suggested.   All questions answered and patient verbalized understanding of the plan.  Steve  Moroni Nester, MD [facility] Endocrinology [facility] [address] [place], [address] [place], [place] [address] Phone # [phone]  At least part of this note was generated using voice recognition software. Inadvertent word errors may have occurred, which were not recognized during the proofreading process.

## 2024-05-02 LAB — BASIC METABOLIC PANEL WITH GFR
BUN: 19 mg/dL (ref 7–25)
CO2: 27 mmol/L (ref 20–32)
Calcium: 9 mg/dL (ref 8.6–10.3)
Chloride: 107 mmol/L (ref 98–110)
Creat: 1.1 mg/dL (ref 0.70–1.22)
Glucose, Bld: 140 mg/dL — ABNORMAL HIGH (ref 65–99)
Potassium: 4.2 mmol/L (ref 3.5–5.3)
Sodium: 142 mmol/L (ref 135–146)
eGFR: 65 mL/min/{1.73_m2} (ref >=60)

## 2024-05-02 LAB — MICROALBUMIN / CREATININE URINE RATIO
Creatinine, Urine: 126 mg/dL (ref 20–320)
Microalb Creat Ratio: 70 mg/g{creat} — ABNORMAL HIGH (ref <30)
Microalb, Ur: 8.8 mg/dL

## 2024-05-23 ENCOUNTER — Other Ambulatory Visit: Payer: Self-pay | Admitting: Student

## 2024-05-23 DIAGNOSIS — I4821 Permanent atrial fibrillation: Secondary | ICD-10-CM

## 2024-05-23 NOTE — Telephone Encounter (Signed)
 Prescription refill request for Eliquis  received. Indication:afib Last office visit:2/25 Scr:1.10  6/25 Age: 89 Weight:91.5  kg  Prescription refilled

## 2024-05-30 NOTE — Progress Notes (Signed)
 Ophthalmology Department Clinical Visit Note     CHIEF COMPLAINT Patient presents for Follow Up Exam   HISTORY OF PRESENT ILLNESS: Steve Bailey is a 89 y.o. male who presents to the clinic today for a follow up evaluation of graft rejection episode OD. The patient is s/p DMEK OD 12/16/2017 and has a history of corneal scar OS, pseudophakia OU and PDR OU. Originally referred by [employer].  Steve Bailey reports his vision in his right eye clears up around 7pm each night, and he is able to clearly watch TV.  Of note, pt is diabetic.   HPI     Follow Up Exam   Follow-Up For:: Corneal graft rejection,Status post DMEK of right eye,Fuchs' corneal dystrophy of left eye, Pseudophakia of both eyes.  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  Associated Signs & Symptoms:: Pt states  My vision is better in the evening. I don't see that good during the day. BS is 194 at 12:57 pm today. .      Last edited by Steve Bailey, COT on 05/31/2024 12:57 PM.     HISTORICAL INFORMATION:   CURRENT MEDICATIONS: Current Outpatient Medications (Ophthalmic Drugs)  Medication Sig  . peg 400-propylene glycol (Systane, propylene glycoL,) 0.4-0.3 % drop ophthalmic solution Administer 1 drop into both eyes as needed for dry eyes.  . prednisoLONE acetate (PRED FORTE) 1 % ophthalmic suspension Administer 1 drop into the right eye 4 (four) times a day.   No current facility-administered medications for this visit. (Ophthalmic Drugs)   Current Outpatient Medications (Other)  Medication Sig  . alfuzosin (UROXATRAL) 10 mg Tb24 24 hour tablet Take 10 mg by mouth Once Daily.  . amLODIPine  (NORVASC ) 10 mg tablet Take 10 mg by mouth daily.  . apixaban  (Eliquis ) 5 mg tab Take 5 mg by mouth 2 (two) times a day.   SABRA ascorbic acid (VITAMIN C) 500 mg tablet Take 1 tablet by mouth Once Daily.  . atorvastatin  (LIPITOR) 40 mg tablet Take 40 mg by mouth Once Daily.  . cholecalciferol (VITAMIN D3) 10 mcg (400 unit) tablet Take 1 tablet by mouth Once Daily.  . cinnamon 500 mg cap Take  by mouth.  . ferrous sulfate 325 mg (65 mg iron) tablet Take 1 tablet by mouth Once Daily.  . finasteride (PROSCAR) 5 mg tablet Take  by mouth Once Daily.  . HumuLIN  70/30 U-100 KwikPen 100 unit/mL (70-30) pen pen Inject under the skin. 15 units in morning and 10 units in evening  . lisinopriL  (PRINIVIL ) 20 mg tablet Take 20 mg by mouth Once Daily.  . metFORMIN  (GLUCOPHAGE  XR) 750 mg 24 hr tablet Take 1,500 mg by mouth 2 (two) times a day. (Patient not taking: Reported on 04/26/2024)  . omega 3-dha-epa-fish oil (OMEGA 3) 1,000 mg capsule Take 2 g by mouth Once Daily.   No current facility-administered medications for this visit. (Other)    ALLERGIES Allergies  Allergen Reactions  . Cephalexin  GI Intolerance    PAST MEDICAL HISTORY Past Medical History:  Diagnosis Date  . Arthritis 1985  . Cancer    (CMD) 2014  . Crushing injury of right hand 12/25/2015   Last Assessment & Plan:  Patient had his hand crushed while working on a car yesterday. It is quite swollen and bruised. He can move all his fingers. He refused  to go to the emergency room. I offered to order an x-ray and refer him to an orthopedic doctor but he really fuses. He says he may go to the urgent care and have it checked out. I urged him to do that today.  . Diabetes mellitus    (CMD)    Med Hx: Diabetes mellitus;   . Diabetic retinopathy    (CMD)   . HL (hearing loss) 2010  . Hypertension   . Visual impairment 2000   Past Surgical History:  Procedure Laterality Date  . CARDIAC SURGERY  1997  . CARDIAC VALVE REPLACEMENT  1997  . CATARACT EXTRACTION     Procedure: CATARACT EXTRACTION  . CORONARY ARTERY BYPASS GRAFT     Procedure: CORONARY ARTERY BYPASS  GRAFT  . DESCEMETS STRIPPING AUTOMATED ENDOTHELIAL KERATOPLASTY Right 12/16/2017   Procedure: DESCEMETS MEMBRANE ENDOTHELIAL KERATOPLASTY;  Surgeon: Donnice Sickles, MD;  Location: [facility];  Service: Ophthalmology;  Laterality: Right;  . EYE SURGERY  2018  . KNEE SURGERY     Procedure: KNEE SURGERY    FAMILY HISTORY Family History  Problem Relation Name Age of Onset  . Heart disease Mother Steve Bailey   . Heart disease Father Steve Bailey   . Stroke Father Steve Bailey   . Prostate cancer Brother Steve Bailey   . Diabetes Brother Steve Bailey   . Hearing loss Brother Steve Bailey   . Heart disease Brother Steve Bailey   . Early death Sister Steve Bailey        Brain bleed  . Hearing loss Brother Steve Bailey   . Heart disease Brother Steve Bailey   . Hearing loss Sister Steve Bailey   . Heart disease Sister Steve Bailey   . Hypertension Sister Steve Bailey   . Heart disease Brother Steve Bailey   . Heart disease Brother Steve Bailey   . Heart disease Sister Glade   . Heart disease Sister Steve Bailey   . Heart disease Sister Steve Bailey   . Hypertension Sister Steve Bailey   . Kidney disease Sister Steve Bailey   . Stroke Brother Steve Bailey   . Anesthesia problems Neg Hx      SOCIAL HISTORY Social History   Tobacco Use  . Smoking status: Former    Current packs/day: 0.00    Types: Cigarettes    Quit date: 11/23/1976    Years since quitting: 47.5    Passive exposure: Never  . Smokeless tobacco: Former    Quit date: 11/24/2011  Substance Use Topics  . Alcohol use: No         OPHTHALMIC EXAM:   Base Eye Exam     Visual Acuity (Snellen - Linear)       Right Left   Dist [place] 20/150 +1 CF perpherially   Dist ph [place] NI          Tonometry (Tonopen: Tetracaine OU, 1:08 PM)       Right Left   Pressure 14 17         Pupils       Shape APD   Right Round None   Left Round +APD         Visual Fields       Left Right   Restrictions Partial outer superior nasal, inferior nasal deficiencies Partial outer inferior nasal deficiency         Extraocular Movement       Right  Left    Full Full         Neuro/Psych     Oriented x3: Yes   Mood/Affect: Normal  Dilation     Both eyes: 2.5% Phenylephrine , 1.0% Tropicamide @ 1:09 PM           Slit Lamp and Fundus Exam     External Exam       Right Left   External Normal Normal         Slit Lamp Exam       Right Left   Lids/Lashes Lower lid laxity Lower lid laxity   Conjunctiva/Sclera White and quiet White and quiet   Cornea DMEK with further improving edema 2+ guttata   Anterior Chamber Deep and quiet Deep and quiet   Iris PI at 5 Round and reactive   Lens PCIOL PCIOL   Anterior Vitreous Normal Normal           Refraction     Manifest Refraction       Sphere Cylinder Axis Dist [place] Add Near [place]   Right [place] +2.50 007 20/60 +2.50 J4-   Left               Final Rx       Sphere Cylinder Axis Add   Right [place] +2.50 007 +2.50   Left Balance       Expiration Date: 05/31/2025            IMAGING AND PROCEDURES:    ASSESSMENT/PLAN:  1. Corneal graft rejection      2. Status post DMEK of right eye      3. Fuchs' corneal dystrophy of left eye      4. Pseudophakia of both eyes      5. Exudative age-related macular degeneration of left eye with active choroidal neovascularization (HCC)      6. Proliferative diabetic retinopathy of both eyes with macular edema associated with type 2 diabetes mellitus (HCC)        S/p DMEK OD 12/16/2017 - Doing well, graft with edema noted 04/12/2024, episode of graft rejection, improving edema noted 04/26/2024, further improving edema 05/31/2024 - Will discuss repeat DMEK at next visit if no improvement of edema - Decrease PF QID to TID for one week then BID until next visit - Start Muro 128 gtts QID - Zaditor prn for itchiness - Recommend ATs 3-4x/day  Fuchs' Dystrophy OS - 2+ guttata. Will continue to monitor - Re-discussed with pt that even if we perform a cornea transplant in the future, he may not achieve improved  vision due to previous retina damage.   Hx of corneal scar OS - Monitor  Pseudophakia OU - Stable  Wet AMD OS NIDDM with PDR OU and macular edema - Followed by Dr. Maree  +Eliquis   Refraction in clinic today. Glasses prescription dispensed.  RTC - 3 month follow up Return in about 3 months (around 08/31/2024) for Follow up.   Patient Instructions  DROP INSTRUCTIONS FOR THE RIGHT EYE Pred Forte (Prednisolone Acetate 1%) (white or pink top - shake bottle) drops in surgical eye three times per day for one week, then two times per day until next visit.  Use Muro 128 (Sodium Chloride  Hypertonicity Ophthalmic Solution, 5%) drops four times per day   Ophthalmic Meds Ordered this visit:  There were no meds ordered this visit.    Explained the diagnoses, plan, and follow up with the patient and they expressed understanding.  Patient expressed understanding of the importance of proper follow up care.    Abbreviations: M myopia (nearsighted); A astigmatism; H hyperopia (farsighted); P presbyopia; Mrx spectacle prescription;  CTL contact  lenses; OD right eye; OS left eye; OU both eyes  XT exotropia; ET esotropia; PEK punctate epithelial keratitis; PEE punctate epithelial erosions; DES dry eye syndrome; MGD meibomian gland dysfunction; ATs artificial tears; PFAT's preservative free artificial tears; NSC nuclear sclerotic cataract; PSC posterior subcapsular cataract; ERM epi-retinal membrane; PVD posterior vitreous detachment; RD retinal detachment; DM diabetes mellitus; DR diabetic retinopathy; NPDR non-proliferative diabetic retinopathy; PDR proliferative diabetic retinopathy; CSME clinically significant macular edema; DME diabetic macular edema; dbh dot blot hemorrhages; CWS cotton wool spot; POAG primary open angle glaucoma; C/D cup-to-disc ratio; HVF humphrey visual field; GVF goldmann visual field; OCT optical coherence tomography; IOP intraocular pressure; BRVO Branch retinal vein  occlusion; CRVO central retinal vein occlusion; CRAO central retinal artery occlusion; BRAO branch retinal artery occlusion; RT retinal tear; SB scleral buckle; PPV pars plana vitrectomy; VH Vitreous hemorrhage; PRP panretinal laser photocoagulation; IVK intravitreal kenalog; VMT vitreomacular traction; MH Macular hole;  NVD neovascularization of the disc; NVE neovascularization elsewhere; AREDS age related eye disease study; ARMD age related macular degeneration; POAG primary open angle glaucoma; EBMD epithelial/anterior basement membrane dystrophy; ACIOL anterior chamber intraocular lens; IOL intraocular lens; PCIOL posterior chamber intraocular lens; Phaco/IOL phacoemulsification with intraocular lens placement; PRK photorefractive keratectomy; LASIK laser assisted in situ keratomileusis; HTN hypertension; DM diabetes mellitus; COPD chronic obstructive pulmonary disease   This document serves as a record of services personally performed by Donnice Sickles, MD. It was created on their behalf by Jinnie Caldron, Scribe, a trained medical scribe. The creation of this record is the provider's dictation and/or activities during the visit.

## 2024-05-31 NOTE — Progress Notes (Signed)
 After obtaining consent and per orders from DrLazoff , an injection of Vitamin B12 was given.  Crystal Joshua Hopping, CMA

## 2024-06-05 ENCOUNTER — Other Ambulatory Visit: Payer: Self-pay

## 2024-06-05 DIAGNOSIS — I7143 Infrarenal abdominal aortic aneurysm, without rupture: Secondary | ICD-10-CM

## 2024-06-08 NOTE — Therapy (Unsigned)
 OUTPATIENT PHYSICAL THERAPY Wound EVALUATION   Patient Name: Steve Bailey MRN: [id number] DOB:09/05/1934, 89 y.o., male Today's Date: 06/09/2024   PCP: [facility]  REFERRING PROVIDER: Clemencia Pac, MD  END OF SESSION:  PT End of Session - 06/09/24 1625     Visit Number 1    Number of Visits 8    Date for PT Re-Evaluation 07/09/24    Authorization Type medicare/ BCBS    PT Start Time 1548    PT Stop Time 1620    PT Time Calculation (min) 32 min    Activity Tolerance Patient tolerated treatment well    Behavior During Therapy WFL for tasks assessed/performed          Past Medical History:  Diagnosis Date   AAA (abdominal aortic aneurysm) (HCC) followed by dr laurence   last duplex 06/28/2018 4.6 cm,  asymptomatic   Anticoagulant long-term use    eliquis    Benign localized prostatic hyperplasia with lower urinary tract symptoms (LUTS)    CAD (coronary artery disease)    s/p CABG in 1997, patent grafts by cath in 2005, low-risk NST in 2012 and 12/2020   DDD (degenerative disc disease), cervical    Full dentures    History of radiation therapy 11/24/12-01/18/13   Prostate 78Gy/38fx   HOH (hard of hearing)    both   Hyperlipidemia    Hyperlipidemia    Hypertension    Permanent atrial fibrillation (HCC) 09/2012   Phimosis    severe   Proliferative diabetic retinopathy (HCC)    both eyes   Prostate cancer [facility]) urologist-- dr alvaro   dx 09-02-2012-- Stage T2a, Gleason 3+4=7, PSA 8.12, vol 103 cc--- completed external beam radiation 02/ 2014;  recurrent 2015 , started hormone therapy   S/P CABG x 3 1997   Strains to urinate    Type 2 diabetes mellitus treated with insulin  [facility])    endocrinologist-- dr von   Past Surgical History:  Procedure Laterality Date   CARDIAC CATHETERIZATION  01-18-2004   dr tisa   patent grafts, ef 55%, minimal anterior hypokinesis,  severe total occlusion of LCFx and LAD,  severe disease of D2 and segmental RCA    CARDIOVASCULAR STRESS TEST  01/07/2011   normal nuclear study w/ no ischemia/  normal LV function and wall motion,  ef 60%   CARPAL TUNNEL RELEASE Right    CATARACT EXTRACTION W/ INTRAOCULAR LENS  IMPLANT, BILATERAL  left 2006;  right 2010   CIRCUMCISION N/A 07/28/2018   Procedure: CIRCUMCISION ADULT;  Surgeon: [name], MD;  Location: [facility];  Service: Urology;  Laterality: N/A;   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   CORONARY ANGIOPLASTY  1990   LAD   CORONARY ARTERY BYPASS GRAFT  11-1995   shows distal LAD disease, without recurrent symptoms of angina with questionable mild apical ischemia but with normal EF    ESOPHAGOGASTRODUODENOSCOPY (EGD) WITH ESOPHAGEAL DILATION  05/2012   KNEE ARTHROSCOPY Left    TRANSTHORACIC ECHOCARDIOGRAM  10/10/2012   ef 55%, akinesis of the inferolateral wall,  grade 2 diastolic dysfunction/  trivial AR/  mild MR and TR/  mild LAE/  mild RVSF reduced and mild dilated RV   Patient Active Problem List   Diagnosis Date Noted   Exudative age-related macular degeneration of left eye with active choroidal neovascularization (HCC) 12/14/2022   Fuchs' corneal dystrophy of left eye 12/14/2022   Status post corneal transplant 12/14/2022   Pancytopenia (HCC) 08/27/2022   Actinic  keratoses 08/09/2019   Proliferative diabetic retinopathy of both eyes with macular edema associated with type 2 diabetes mellitus (HCC) 02/25/2016   Pseudophakia of both eyes 02/25/2016   Crushing injury of right hand 12/25/2015   AAA (abdominal aortic aneurysm) without rupture (HCC) 12/18/2015   Non compliance w medication regimen 01/08/2014   Atrial fibrillation (HCC)    Prostate cancer (HCC)    Adult onset hypothyroidism 10/10/2012   Hypertension 10/10/2012   Diabetic macular edema (HCC) 07/22/2012   Macula scar of posterior pole 07/22/2012   Coronary artery disease 03/11/2012   Hyperlipidemia 03/11/2012   Type 2 diabetes mellitus with hyperlipidemia (HCC)  03/11/2012   Proliferative diabetic retinopathy (HCC) 12/29/2011    ONSET DATE: 05/29/24  REFERRING DIAG: Lt forearm abnormal wound healing  THERAPY DIAG:  Open wound Left forearm Lt arm pain   Rationale for Evaluation and Treatment: Rehabilitation     Wound Therapy - 06/09/24 0001     Subjective PT states that he lost his balance and ran his arm down his dresser taking off all the skin.  Pt states that his arm is hurting quite a bit today; his daughter has been taking care of it for him.    Patient and Family Stated Goals wound to heal    Date of Onset 05/29/24    Prior Treatments urgent care, antibiotic, self care    Pain Scale 0-10    Pain Score 10-Worst pain ever    Pain Type Acute pain    Pain Location Arm    Pain Orientation Left;Lower    Pain Descriptors / Indicators Burning;Throbbing    Patients Stated Pain Goal 0    Evaluation and Treatment Procedures Explained to Patient/Family Yes    Evaluation and Treatment Procedures agreed to    Wound Properties Date First Assessed: 06/09/24 Time First Assessed: 1555 Present on Original Admission: Yes Primary Wound Type: Traumatic Secondary Wound Type - Traumatic: Abrasion Location: Arm Location Orientation: Left   Wound Image Images linked: 1    Site / Wound Assessment Dry    Peri-wound Assessment Intact    Wound Length (cm) 7 cm    Wound Width (cm) 7 cm    Wound Surface Area (cm^2) 38.48 cm^2    Drainage Description Serosanguineous    Drainage Amount Scant    Treatments Cleansed;Moisturizing cream;Other (Comment)   debridement using scalpel and forceps .  PT 80% graniulated ; 20% slough.   Dressing Type Impregnated gauze (bismuth);Gauze (Comment)    Dressing Changed Changed    Dressing Status Old drainage    Wound Properties Date First Assessed: 06/09/24 Time First Assessed: 1600 Present on Original Admission: Yes Primary Wound Type: Traumatic Location: Arm , superior to larger wound  Location Orientation: Left;Proximal    Wound Image --   see above image.   Site / Wound Assessment Granulation tissue;Dry    Peri-wound Assessment Intact    Wound Length (cm) 3 cm    Wound Width (cm) 1.2 cm    Wound Surface Area (cm^2) 2.83 cm^2    Drainage Description No odor    Drainage Amount None    Treatments Cleansed    Dressing Type Impregnated gauze (petrolatum)    Dressing Changed Changed    Dressing Status Old drainage    Wound Therapy - Clinical Statement see below    Wound Therapy - Functional Problem List difficultin using arm due to pain    Factors Delaying/Impairing Wound Healing Diabetes Mellitus;Infection - systemic/local    Hydrotherapy  Plan Debridement;Dressing change;Patient/family education    Wound Therapy - Frequency 2X / week   for 4 weeks   Wound Therapy - Current Recommendations PT    Wound Plan debridement and dressing change.    Dressing  vaseline to periphery, medihoney to wound, 4x4, kling, coban and netting            PATIENT EDUCATION: Education details: Keep dressing dry try not to remove until he returns on Monday.  Person educated: Patient Education method: Explanation, Demonstration, and Handouts Education comprehension: verbalized understanding   HOME EXERCISE PROGRAM: N/A at this time.    GOALS: Goals reviewed with patient? No  SHORT TERM GOALS: Target date: 06/22/24  Pt wound to be 100% granulated Baseline: Goal status: INITIAL  2.  Pt pain to be no greater that 5/10 Baseline:  Goal status: INITIAL  LONG TERM GOALS: Target date: 06/23/24  Pt wound to be healed Baseline:  Goal status: INITIAL  2.  Pt to have no pain in LT forearm  Baseline:  Goal status: INITIAL   ASSESSMENT:  CLINICAL IMPRESSION: Patient is an 89 y.o. male who was seen today for physical therapy evaluation and treatment for a non-healing wound of his Lt forearm.  PT lost his balance and ran his arm down his dresser taking the skin off.  He was treated at Urgent Care, antibiotics were  prescribed as well as referring to physical therapy.  Pt has a layer of slough covering his wound and will benefit from skilled PT to create a positive wound healing environment.    OBJECTIVE IMPAIRMENTS: increased fascial restrictions, pain, and decreased skin integrity.   ACTIVITY LIMITATIONS: dressing and hygiene/grooming  PARTICIPATION LIMITATIONS: yard work  PERSONAL FACTORS: Age, Fitness, Time since onset of injury/illness/exacerbation, and 1-2 comorbidities: CAD, DM are also affecting patient's functional outcome.   REHAB POTENTIAL: Good  CLINICAL DECISION MAKING: Stable/uncomplicated  EVALUATION COMPLEXITY: Low  PLAN: PT FREQUENCY: 2x/week  PT DURATION: 4 weeks  PLANNED INTERVENTIONS: 97110-Therapeutic exercises, 97535- Self Care, 02859- Manual therapy, and 97597- Wound care (first 20 sq cm)  PLAN FOR NEXT SESSION: continue with appropriate wound care changing dressings as wound environment dictates. Check ROM if ROM is decreased begin AROM exercises to regain ROM.   Montie Metro, PT CLT [phone]  06/09/2024, 4:41 PM

## 2024-06-09 ENCOUNTER — Other Ambulatory Visit: Payer: Self-pay

## 2024-06-09 ENCOUNTER — Ambulatory Visit (HOSPITAL_COMMUNITY): Attending: Preventative Medicine | Admitting: Physical Therapy

## 2024-06-09 DIAGNOSIS — S51802D Unspecified open wound of left forearm, subsequent encounter: Secondary | ICD-10-CM | POA: Insufficient documentation

## 2024-06-09 DIAGNOSIS — M79602 Pain in left arm: Secondary | ICD-10-CM | POA: Diagnosis present

## 2024-06-12 ENCOUNTER — Encounter (HOSPITAL_COMMUNITY): Payer: Self-pay

## 2024-06-12 ENCOUNTER — Ambulatory Visit (HOSPITAL_COMMUNITY)

## 2024-06-12 DIAGNOSIS — S51802D Unspecified open wound of left forearm, subsequent encounter: Secondary | ICD-10-CM

## 2024-06-12 DIAGNOSIS — M79602 Pain in left arm: Secondary | ICD-10-CM

## 2024-06-12 NOTE — Therapy (Signed)
 OUTPATIENT PHYSICAL THERAPY Wound EVALUATION   Patient Name: Steve Bailey MRN: [id number] DOB:03/13/1934, 89 y.o., male Today's Date: 06/12/2024   PCP: [facility]  REFERRING PROVIDER: Clemencia Pac, MD  END OF SESSION:  PT End of Session - 06/12/24 1103     Visit Number 2    Number of Visits 8    Date for PT Re-Evaluation 07/09/24    Authorization Type medicare/ BCBS    PT Start Time 1018    PT Stop Time 1050    PT Time Calculation (min) 32 min    Activity Tolerance Patient tolerated treatment well    Behavior During Therapy WFL for tasks assessed/performed          Past Medical History:  Diagnosis Date   AAA (abdominal aortic aneurysm) (HCC) followed by dr laurence   last duplex 06/28/2018 4.6 cm,  asymptomatic   Anticoagulant long-term use    eliquis    Benign localized prostatic hyperplasia with lower urinary tract symptoms (LUTS)    CAD (coronary artery disease)    s/p CABG in 1997, patent grafts by cath in 2005, low-risk NST in 2012 and 12/2020   DDD (degenerative disc disease), cervical    Full dentures    History of radiation therapy 11/24/12-01/18/13   Prostate 78Gy/56fx   HOH (hard of hearing)    both   Hyperlipidemia    Hyperlipidemia    Hypertension    Permanent atrial fibrillation (HCC) 09/2012   Phimosis    severe   Proliferative diabetic retinopathy (HCC)    both eyes   Prostate cancer [facility]) urologist-- dr alvaro   dx 09-02-2012-- Stage T2a, Gleason 3+4=7, PSA 8.12, vol 103 cc--- completed external beam radiation 02/ 2014;  recurrent 2015 , started hormone therapy   S/P CABG x 3 1997   Strains to urinate    Type 2 diabetes mellitus treated with insulin  [facility])    endocrinologist-- dr von   Past Surgical History:  Procedure Laterality Date   CARDIAC CATHETERIZATION  01-18-2004   dr tisa   patent grafts, ef 55%, minimal anterior hypokinesis,  severe total occlusion of LCFx and LAD,  severe disease of D2 and segmental RCA    CARDIOVASCULAR STRESS TEST  01/07/2011   normal nuclear study w/ no ischemia/  normal LV function and wall motion,  ef 60%   CARPAL TUNNEL RELEASE Right    CATARACT EXTRACTION W/ INTRAOCULAR LENS  IMPLANT, BILATERAL  left 2006;  right 2010   CIRCUMCISION N/A 07/28/2018   Procedure: CIRCUMCISION ADULT;  Surgeon: [name], MD;  Location: [facility];  Service: Urology;  Laterality: N/A;   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   CORONARY ANGIOPLASTY  1990   LAD   CORONARY ARTERY BYPASS GRAFT  11-1995   shows distal LAD disease, without recurrent symptoms of angina with questionable mild apical ischemia but with normal EF    ESOPHAGOGASTRODUODENOSCOPY (EGD) WITH ESOPHAGEAL DILATION  05/2012   KNEE ARTHROSCOPY Left    TRANSTHORACIC ECHOCARDIOGRAM  10/10/2012   ef 55%, akinesis of the inferolateral wall,  grade 2 diastolic dysfunction/  trivial AR/  mild MR and TR/  mild LAE/  mild RVSF reduced and mild dilated RV   Patient Active Problem List   Diagnosis Date Noted   Exudative age-related macular degeneration of left eye with active choroidal neovascularization (HCC) 12/14/2022   Fuchs' corneal dystrophy of left eye 12/14/2022   Status post corneal transplant 12/14/2022   Pancytopenia (HCC) 08/27/2022   Actinic  keratoses 08/09/2019   Proliferative diabetic retinopathy of both eyes with macular edema associated with type 2 diabetes mellitus (HCC) 02/25/2016   Pseudophakia of both eyes 02/25/2016   Crushing injury of right hand 12/25/2015   AAA (abdominal aortic aneurysm) without rupture (HCC) 12/18/2015   Non compliance w medication regimen 01/08/2014   Atrial fibrillation (HCC)    Prostate cancer (HCC)    Adult onset hypothyroidism 10/10/2012   Hypertension 10/10/2012   Diabetic macular edema (HCC) 07/22/2012   Macula scar of posterior pole 07/22/2012   Coronary artery disease 03/11/2012   Hyperlipidemia 03/11/2012   Type 2 diabetes mellitus with hyperlipidemia (HCC)  03/11/2012   Proliferative diabetic retinopathy (HCC) 12/29/2011    ONSET DATE: 05/29/24  REFERRING DIAG: Lt forearm abnormal wound healing  THERAPY DIAG:  Open wound Left forearm Lt arm pain   Rationale for Evaluation and Treatment: Rehabilitation    Media Information   Document Information  Photos  Left arm wound  06/12/2024 00:01  Attached To:  Outpatient Rehab on 06/12/24 with Renae Steve PARAS, PTA  Source Information  Renae Steve PARAS, PTA  [facility]    Wound Therapy - 06/12/24 0001     Subjective Pt arrived with dressings intact, daugther with him today.    Patient and Family Stated Goals wound to heal    Date of Onset 05/29/24    Prior Treatments urgent care, antibiotic, self care    Pain Scale 0-10    Pain Score 7     Pain Type Acute pain    Pain Location Arm    Pain Orientation Left;Lower    Pain Descriptors / Indicators Burning;Throbbing    Patients Stated Pain Goal 0    Evaluation and Treatment Procedures Explained to Patient/Family Yes    Evaluation and Treatment Procedures agreed to    Wound Properties Date First Assessed: 06/09/24 Time First Assessed: 1555 Present on Original Admission: Yes Primary Wound Type: Traumatic Secondary Wound Type - Traumatic: Abrasion Location: Arm Location Orientation: Left   Wound Image Images linked: 1    Site / Wound Assessment Dry    Peri-wound Assessment Intact    Drainage Description Serosanguineous    Drainage Amount Scant    Treatments Cleansed;Moisturizing cream   debridement using scalpel and forceps .  PT 80% graniulated ; 20% slough.   Dressing Type Gauze (Comment)   Medihoney, 4x4, vaseline/lotion perimeter, kerlix, coban, netting   Dressing Changed Changed    Dressing Status Old drainage    Wound Properties Date First Assessed: 06/09/24 Time First Assessed: 1600 Present on Original Admission: Yes Primary Wound Type: Traumatic Location: Arm , superior to larger wound  Location Orientation:  Left;Proximal   Site / Wound Assessment Granulation tissue;Dry    Peri-wound Assessment Intact    Drainage Description No odor    Drainage Amount None    Treatments Cleansed   debridement; granulation at 85%, slough 15%   Dressing Type Gauze (Comment)   vaseline to periphery, medihoney to wound, 4x4, kling, coban and netting   Dressing Changed Changed    Dressing Status Old drainage    Wound Therapy - Clinical Statement see below    Wound Therapy - Functional Problem List difficultin using arm due to pain    Factors Delaying/Impairing Wound Healing Diabetes Mellitus;Infection - systemic/local    Hydrotherapy Plan Debridement;Dressing change;Patient/family education    Wound Therapy - Frequency 2X / week   for 4 weeks   Wound Therapy - Current Recommendations PT  Wound Plan debridement and dressing change.    Dressing  vaseline to periphery, medihoney to wound, 4x4, kling, coban and netting               PATIENT EDUCATION: Education details: Keep dressing dry try not to remove until he returns on Monday.  Person educated: Patient Education method: Explanation, Demonstration, and Handouts Education comprehension: verbalized understanding   HOME EXERCISE PROGRAM: N/A at this time.    GOALS: Goals reviewed with patient? No  SHORT TERM GOALS: Target date: 06/22/24  Pt wound to be 100% granulated Baseline: Goal status: INITIAL  2.  Pt pain to be no greater that 5/10 Baseline:  Goal status: INITIAL  LONG TERM GOALS: Target date: 06/23/24  Pt wound to be healed Baseline:  Goal status: INITIAL  2.  Pt to have no pain in LT forearm  Baseline:  Goal status: INITIAL   ASSESSMENT:  CLINICAL IMPRESSION: 06/12/24:  Selective debridement for removal of slough from wound bed to promote healing.  Continued with medihoney, 4x4, kerlix, coban.  Instructed to keep dressings intact unless gets wet, remove and redress.  Encouraged pt to move finger, wrist, elbow and shoulder  regularly, did not assess shoulder ROM this session, plan to next session.    Eval:  Patient is an 89 y.o. male who was seen today for physical therapy evaluation and treatment for a non-healing wound of his Lt forearm.  PT lost his balance and ran his arm down his dresser taking the skin off.  He was treated at Urgent Care, antibiotics were prescribed as well as referring to physical therapy.  Pt has a layer of slough covering his wound and will benefit from skilled PT to create a positive wound healing environment.    OBJECTIVE IMPAIRMENTS: increased fascial restrictions, pain, and decreased skin integrity.   ACTIVITY LIMITATIONS: dressing and hygiene/grooming  PARTICIPATION LIMITATIONS: yard work  PERSONAL FACTORS: Age, Fitness, Time since onset of injury/illness/exacerbation, and 1-2 comorbidities: CAD, DM are also affecting patient's functional outcome.   REHAB POTENTIAL: Good  CLINICAL DECISION MAKING: Stable/uncomplicated  EVALUATION COMPLEXITY: Low  PLAN: PT FREQUENCY: 2x/week  PT DURATION: 4 weeks  PLANNED INTERVENTIONS: 97110-Therapeutic exercises, 97535- Self Care, 02859- Manual therapy, and 97597- Wound care (first 20 sq cm)  PLAN FOR NEXT SESSION: continue with appropriate wound care changing dressings as wound environment dictates. Check ROM if ROM is decreased begin AROM exercises to regain ROM.    Steve Bailey, LPTA/CLT; Steve Bailey [phone]  06/12/2024, 11:08 AM

## 2024-06-12 NOTE — Progress Notes (Signed)
 Patient name: Steve Bailey MRN: [id number] DOB: 1934/07/19 Sex: male  REASON FOR VISIT: 6 month f/u AAA   HPI: Steve Bailey is a 89 y.o. male with history of diabetes, hypertension, hyperlipidemia, prostate cancer, A. Fib on eliquis , known abdominal aortic aneurysm, coronary disease status post CABG that presents for 6 month follow-up AAA.  Previously seen on 05/18/2023 with 5.5 cm AAA by duplex and was sent for CT.  CT confirm the aneurysm was smaller at 5 cm.  On last follow-up from 11/30/2023 no change in duplex findings with a 5.5 cm AAA the same diameter as prior ultrasounds before CT.  No new abdominal pain.  No new concerns today.  Did have an injury to his left arm going to the wound clinic.  Past Medical History:  Diagnosis Date   AAA (abdominal aortic aneurysm) (HCC) followed by dr Steve Bailey   last duplex 06/28/2018 4.6 cm,  asymptomatic   Anticoagulant long-term use    eliquis    Benign localized prostatic hyperplasia with lower urinary tract symptoms (LUTS)    CAD (coronary artery disease)    s/p CABG in 1997, patent grafts by cath in 2005, low-risk NST in 2012 and 12/2020   DDD (degenerative disc disease), cervical    Full dentures    History of radiation therapy 11/24/12-01/18/13   Prostate 78Gy/77fx   HOH (hard of hearing)    both   Hyperlipidemia    Hyperlipidemia    Hypertension    Permanent atrial fibrillation (HCC) 09/2012   Phimosis    severe   Proliferative diabetic retinopathy (HCC)    both eyes   Prostate cancer [facility]) urologist-- dr Steve   dx 09-02-2012-- Stage T2a, Gleason 3+4=7, PSA 8.12, vol 103 cc--- completed external beam radiation 02/ 2014;  recurrent 2015 , started hormone therapy   S/P CABG x 3 1997   Strains to urinate    Type 2 diabetes mellitus treated with insulin  [facility])    endocrinologist-- dr von    Past Surgical History:  Procedure Laterality Date   CARDIAC CATHETERIZATION  01-18-2004   dr tisa   patent grafts, ef 55%, minimal anterior  hypokinesis,  severe total occlusion of LCFx and LAD,  severe disease of D2 and segmental RCA   CARDIOVASCULAR STRESS TEST  01/07/2011   normal nuclear study w/ no ischemia/  normal LV function and wall motion,  ef 60%   CARPAL TUNNEL RELEASE Right    CATARACT EXTRACTION W/ INTRAOCULAR LENS  IMPLANT, BILATERAL  left 2006;  right 2010   CIRCUMCISION N/A 07/28/2018   Procedure: CIRCUMCISION ADULT;  Surgeon: Steve Hummer, MD;  Location: [facility];  Service: Urology;  Laterality: N/A;   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   CORONARY ANGIOPLASTY  1990   LAD   CORONARY ARTERY BYPASS GRAFT  11-1995   shows distal LAD disease, without recurrent symptoms of angina with questionable mild apical ischemia but with normal EF    ESOPHAGOGASTRODUODENOSCOPY (EGD) WITH ESOPHAGEAL DILATION  05/2012   KNEE ARTHROSCOPY Left    TRANSTHORACIC ECHOCARDIOGRAM  10/10/2012   ef 55%, akinesis of the inferolateral wall,  grade 2 diastolic dysfunction/  trivial AR/  mild MR and TR/  mild LAE/  mild RVSF reduced and mild dilated RV    Family History  Problem Relation Age of Onset   Stroke Father    Heart attack Mother    Heart disease Mother    Cancer Sister        lung  Cancer Brother        prostate   Diabetes Brother    Stroke Brother    Stroke Brother    Heart disease Brother    Heart disease Brother    Heart attack Sister    Heart disease Sister    Heart attack Sister    Cancer Sister        breast   Heart disease Sister    Heart attack Sister    Heart disease Sister    Heart disease Sister     SOCIAL HISTORY: Social History   Tobacco Use   Smoking status: Former    Current packs/day: 0.00    Types: Cigarettes    Start date: 10/14/1959    Quit date: 10/14/1979    Years since quitting: 44.6   Smokeless tobacco: Former    Types: Chew    Quit date: 11/23/2012  Substance Use Topics   Alcohol use: No    Allergies  Allergen Reactions   Cephalexin  Nausea And Vomiting    Tramadol  Palpitations    Current Outpatient Medications  Medication Sig Dispense Refill   acetaminophen  (TYLENOL ) 500 MG tablet Take 1,300 mg by mouth at bedtime.     alfuzosin (UROXATRAL) 10 MG 24 hr tablet Take 10 mg by mouth at bedtime. Reported on 12/18/2015     amLODipine  (NORVASC ) 10 MG tablet TAKE 1 TABLET DAILY 90 tablet 3   atorvastatin  (LIPITOR) 40 MG tablet TAKE 1 TABLET DAILY 90 tablet 3   Blood Glucose Monitoring Suppl (FREESTYLE LITE) w/Device KIT 1 Device by Does not apply route 2 (two) times daily. 1 kit 0   Cinnamon 500 MG capsule Take 500 mg by mouth 2 (two) times daily.     Continuous Glucose Sensor (FREESTYLE LIBRE 3 SENSOR) MISC 1 each by Does not apply route every 14 (fourteen) days. 6 each 1   ELIQUIS  5 MG TABS tablet TAKE 1 TABLET TWICE A DAY 180 tablet 3   ezetimibe  (ZETIA ) 10 MG tablet TAKE 1 TABLET DAILY 90 tablet 3   ferrous sulfate 325 (65 FE) MG tablet Take 1 tablet by mouth daily.     finasteride (PROSCAR) 5 MG tablet Take 5 mg by mouth every morning. Reported on 12/18/2015     Glucagon  (GVOKE HYPOPEN  1-PACK) 1 MG/0.2ML SOAJ Inject 1 mg into the skin as needed (low blood sugar with impaired consciousness). 0.4 mL 2   glucose blood (FREESTYLE LITE) test strip USE TO TEST TWICE A DAY 100 strip 2   Insulin  Lispro Prot & Lispro (HUMALOG  MIX 75/25 KWIKPEN) (75-25) 100 UNIT/ML Kwikpen 12 units with breakfast and 8 units with supper. 30 mL 4   Insulin  Pen Needle (PEN NEEDLES) 31G X 5 MM MISC 1 each by Does not apply route 2 (two) times daily. 180 each 2   lisinopril  (ZESTRIL ) 20 MG tablet TAKE 1 TABLET DAILY 90 tablet 3   metFORMIN  (GLUCOPHAGE -XR) 750 MG 24 hr tablet Take 1 tablet (750 mg total) by mouth 2 (two) times daily with a meal. (Patient not taking: Reported on 05/01/2024) 180 tablet 3   nitroGLYCERIN  (NITROSTAT ) 0.4 MG SL tablet Place 1 tablet (0.4 mg total) under the tongue every 5 (five) minutes as needed for chest pain. 25 tablet 3   Omega-3 Fatty Acids (OMEGA-3  FISH OIL PO) Take 1 capsule by mouth daily.     pantoprazole  (PROTONIX ) 40 MG tablet TAKE 1 TABLET DAILY 90 tablet 3   prednisoLONE acetate (PRED FORTE) 1 % ophthalmic  suspension PLACE 1 DROP INTO THE RIGHT EYE DAILY.     No current facility-administered medications for this visit.    REVIEW OF SYSTEMS:  [X]  denotes positive finding, [ ]  denotes negative finding Cardiac  Comments:  Chest pain or chest pressure:    Shortness of breath upon exertion:    Short of breath when lying flat:    Irregular heart rhythm:        Vascular    Pain in calf, thigh, or hip brought on by ambulation:    Pain in feet at night that wakes you up from your sleep:     Blood clot in your veins:    Leg swelling:         Pulmonary    Oxygen at home:    Productive cough:     Wheezing:         Neurologic    Sudden weakness in arms or legs:     Sudden numbness in arms or legs:     Sudden onset of difficulty speaking or slurred speech:    Temporary loss of vision in one eye:     Problems with dizziness:         Gastrointestinal    Blood in stool:     Vomited blood:         Genitourinary    Burning when urinating:     Blood in urine:        Psychiatric    Major depression:         Hematologic    Bleeding problems:    Problems with blood clotting too easily:        Skin    Rashes or ulcers:        Constitutional    Fever or chills:      PHYSICAL EXAM: There were no vitals filed for this visit.     GENERAL: The patient is a well-nourished male, in no acute distress. The vital signs are documented above. CARDIAC: There is a regular rate and rhythm.  VASCULAR:  2+ palpable femoral pulse bilaterally No palpable pedal pulses PULMONARY: No respiratory distress. ABDOMEN: Soft and non-tender.  No pain with palpation of aneurysm. MUSCULOSKELETAL: There are no major deformities or cyanosis. NEUROLOGIC: No focal weakness or paresthesias are detected. SKIN: There are no ulcers or rashes  noted. PSYCHIATRIC: The patient has a normal affect.  DATA:   AAA duplex shows aneurysm 5.5 cm --> 5.56 cm over past 6 months by duplex  CTA 05/11/23: 5.0 cm AAA  Assessment/Plan:  89 y.o. male with history of diabetes, hypertension, hyperlipidemia, prostate cancer, A. Fib on eliquis , known abdominal aortic aneurysm, coronary disease status post CABG that presents for 6 month follow-up AAA.  Previously seen on 05/18/2023 with 5.5 cm AAA by duplex and was sent for CT.  CT confirm the aneurysm was smaller at 5 cm.  Duplex today shows continued slow growth in his aneurysm now measuring near 5.6 cm by duplex.  Difficult to know if there is true growth in his aneurysm as prior CT over a year ago showed a smaller aneurysm.  I will get an updated CT without contrast that we can get a true aneurysm measurement and if this is 5.5 cm discussed would pursue stent graft repair.  Lonni DOROTHA Gaskins, MD [facility] Office: [phone]

## 2024-06-13 ENCOUNTER — Ambulatory Visit (HOSPITAL_COMMUNITY)
Admission: RE | Admit: 2024-06-13 | Discharge: 2024-06-13 | Disposition: A | Discharge status: Home / Self Care | Source: Ambulatory Visit | Attending: Vascular Surgery | Admitting: Vascular Surgery

## 2024-06-13 ENCOUNTER — Ambulatory Visit (INDEPENDENT_AMBULATORY_CARE_PROVIDER_SITE_OTHER): Attending: Vascular Surgery | Admitting: Vascular Surgery

## 2024-06-13 ENCOUNTER — Encounter: Payer: Self-pay | Admitting: Vascular Surgery

## 2024-06-13 VITALS — BP 142/82 | HR 87 | Temp 97.6°F | Resp 18 | Ht 72.0 in | Wt 197.8 lb

## 2024-06-13 DIAGNOSIS — I7143 Infrarenal abdominal aortic aneurysm, without rupture: Secondary | ICD-10-CM | POA: Diagnosis not present

## 2024-06-14 ENCOUNTER — Other Ambulatory Visit: Payer: Self-pay

## 2024-06-14 DIAGNOSIS — I7143 Infrarenal abdominal aortic aneurysm, without rupture: Secondary | ICD-10-CM

## 2024-06-15 ENCOUNTER — Ambulatory Visit (HOSPITAL_COMMUNITY): Admitting: Physical Therapy

## 2024-06-15 DIAGNOSIS — S51802D Unspecified open wound of left forearm, subsequent encounter: Secondary | ICD-10-CM

## 2024-06-15 DIAGNOSIS — M79602 Pain in left arm: Secondary | ICD-10-CM

## 2024-06-15 NOTE — Therapy (Signed)
 OUTPATIENT PHYSICAL THERAPY Wound EVALUATION   Patient Name: Steve Bailey MRN: [id number] DOB:1934-07-01, 89 y.o., male Today's Date: 06/15/2024   PCP: [facility]  REFERRING PROVIDER: Clemencia Pac, MD  END OF SESSION:  PT End of Session - 06/15/24 1639     Visit Number 3    Number of Visits 8    Date for PT Re-Evaluation 07/09/24    Authorization Type medicare/ BCBS    PT Start Time 1600    PT Stop Time 1620    PT Time Calculation (min) 20 min    Activity Tolerance Patient tolerated treatment well    Behavior During Therapy [facility] for tasks assessed/performed          Past Medical History:  Diagnosis Date   AAA (abdominal aortic aneurysm) (HCC) followed by dr laurence   last duplex 06/28/2018 4.6 cm,  asymptomatic   Anticoagulant long-term use    eliquis    Benign localized prostatic hyperplasia with lower urinary tract symptoms (LUTS)    CAD (coronary artery disease)    s/p CABG in 1997, patent grafts by cath in 2005, low-risk NST in 2012 and 12/2020   DDD (degenerative disc disease), cervical    Full dentures    History of radiation therapy 11/24/12-01/18/13   Prostate 78Gy/11fx   HOH (hard of hearing)    both   Hyperlipidemia    Hyperlipidemia    Hypertension    Permanent atrial fibrillation (HCC) 09/2012   Phimosis    severe   Proliferative diabetic retinopathy (HCC)    both eyes   Prostate cancer [facility]) urologist-- dr alvaro   dx 09-02-2012-- Stage T2a, Gleason 3+4=7, PSA 8.12, vol 103 cc--- completed external beam radiation 02/ 2014;  recurrent 2015 , started hormone therapy   S/P CABG x 3 1997   Strains to urinate    Type 2 diabetes mellitus treated with insulin  [facility])    endocrinologist-- dr von   Past Surgical History:  Procedure Laterality Date   CARDIAC CATHETERIZATION  01-18-2004   dr tisa   patent grafts, ef 55%, minimal anterior hypokinesis,  severe total occlusion of LCFx and LAD,  severe disease of D2 and segmental RCA    CARDIOVASCULAR STRESS TEST  01/07/2011   normal nuclear study w/ no ischemia/  normal LV function and wall motion,  ef 60%   CARPAL TUNNEL RELEASE Right    CATARACT EXTRACTION W/ INTRAOCULAR LENS  IMPLANT, BILATERAL  left 2006;  right 2010   CIRCUMCISION N/A 07/28/2018   Procedure: CIRCUMCISION ADULT;  Surgeon: [name], MD;  Location: [facility];  Service: Urology;  Laterality: N/A;   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   CORONARY ANGIOPLASTY  1990   LAD   CORONARY ARTERY BYPASS GRAFT  11-1995   shows distal LAD disease, without recurrent symptoms of angina with questionable mild apical ischemia but with normal EF    ESOPHAGOGASTRODUODENOSCOPY (EGD) WITH ESOPHAGEAL DILATION  05/2012   KNEE ARTHROSCOPY Left    TRANSTHORACIC ECHOCARDIOGRAM  10/10/2012   ef 55%, akinesis of the inferolateral wall,  grade 2 diastolic dysfunction/  trivial AR/  mild MR and TR/  mild LAE/  mild RVSF reduced and mild dilated RV   Patient Active Problem List   Diagnosis Date Noted   Exudative age-related macular degeneration of left eye with active choroidal neovascularization (HCC) 12/14/2022   Fuchs' corneal dystrophy of left eye 12/14/2022   Status post corneal transplant 12/14/2022   Pancytopenia (HCC) 08/27/2022   Actinic  keratoses 08/09/2019   Proliferative diabetic retinopathy of both eyes with macular edema associated with type 2 diabetes mellitus (HCC) 02/25/2016   Pseudophakia of both eyes 02/25/2016   Crushing injury of right hand 12/25/2015   AAA (abdominal aortic aneurysm) without rupture (HCC) 12/18/2015   Non compliance w medication regimen 01/08/2014   Atrial fibrillation (HCC)    Prostate cancer (HCC)    Adult onset hypothyroidism 10/10/2012   Hypertension 10/10/2012   Diabetic macular edema (HCC) 07/22/2012   Macula scar of posterior pole 07/22/2012   Coronary artery disease 03/11/2012   Hyperlipidemia 03/11/2012   Type 2 diabetes mellitus with hyperlipidemia (HCC)  03/11/2012   Proliferative diabetic retinopathy (HCC) 12/29/2011    ONSET DATE: 05/29/24  REFERRING DIAG: Lt forearm abnormal wound healing  THERAPY DIAG:  Open wound Left forearm Lt arm pain   Rationale for Evaluation and Treatment: Rehabilitation      Wound Therapy - 06/15/24 0001     Subjective PT states that there was too much dressing on his wound.    Patient and Family Stated Goals wound to heal    Date of Onset 05/29/24    Prior Treatments urgent care, antibiotic, self care    Pain Scale 0-10    Pain Score 0-No pain    Evaluation and Treatment Procedures Explained to Patient/Family Yes    Evaluation and Treatment Procedures agreed to    Wound Properties Date First Assessed: 06/09/24 Time First Assessed: 1555 Present on Original Admission: Yes Primary Wound Type: Traumatic Secondary Wound Type - Traumatic: Abrasion Location: Arm Location Orientation: Left   Site / Wound Assessment Dry    Peri-wound Assessment Intact    Wound Length (cm) 5 cm    Wound Width (cm) 4 cm    Wound Surface Area (cm^2) 15.71 cm^2    Drainage Description Serosanguineous    Drainage Amount Scant    Treatments Cleansed;Site care;Other (Comment)   debridement   Dressing Type Gauze (Comment)   Medihoney, 4x4, vaseline/lotion perimeter, kerlix, coban, netting   Dressing Status Old drainage    Wound Properties Date First Assessed: 06/09/24 Time First Assessed: 1600 Present on Original Admission: Yes Primary Wound Type: Traumatic Location: Arm , superior to larger wound  Location Orientation: Left;Proximal   Site / Wound Assessment Granulation tissue;Dry    Peri-wound Assessment Intact    Wound Length (cm) 2 cm    Wound Width (cm) 0.5 cm    Wound Surface Area (cm^2) 0.79 cm^2    Drainage Description No odor    Drainage Amount None    Treatments Cleansed    Dressing Type Gauze (Comment)   vaseline to periphery, medihoney to wound, 4x4, kling, coban and netting   Dressing Status Old drainage     Wound Therapy - Clinical Statement see below    Wound Therapy - Functional Problem List difficultin using arm due to pain    Factors Delaying/Impairing Wound Healing Diabetes Mellitus;Infection - systemic/local    Hydrotherapy Plan Debridement;Dressing change;Patient/family education    Wound Therapy - Frequency 2X / week   for 4 weeks   Wound Therapy - Current Recommendations PT    Wound Plan debridement and dressing change.    Dressing  vaseline to periphery, xeroform f/b 4x4 , 3 kling and netting.               PATIENT EDUCATION: Education details: Keep dressing dry try not to remove until he returns on Monday.  Person educated: Patient Education method: Explanation, Demonstration,  and Handouts Education comprehension: verbalized understanding   HOME EXERCISE PROGRAM: N/A at this time.    GOALS: Goals reviewed with patient? No  SHORT TERM GOALS: Target date: 06/22/24  Pt wound to be 100% granulated Baseline: Goal status: MET following debridement  2.  Pt pain to be no greater that 5/10 Baseline:  Goal status: MET  LONG TERM GOALS: Target date: 06/23/24  Pt wound to be healed Baseline:  Goal status: IN PROGRESS  2.  Pt to have no pain in LT forearm  Baseline:  Goal status: MET   ASSESSMENT:  CLINICAL IMPRESSION: Pt wound has approximated significantly.  Changed dressing to xeroform due to dryness of wound.  Therapist checked ROM which is functional.  Most likely pt will be ready for discharge next treatment.  OBJECTIVE IMPAIRMENTS: increased fascial restrictions, pain, and decreased skin integrity.   ACTIVITY LIMITATIONS: dressing and hygiene/grooming  PARTICIPATION LIMITATIONS: yard work  PERSONAL FACTORS: Age, Fitness, Time since onset of injury/illness/exacerbation, and 1-2 comorbidities: CAD, DM are also affecting patient's functional outcome.   REHAB POTENTIAL: Good  CLINICAL DECISION MAKING: Stable/uncomplicated  EVALUATION COMPLEXITY:  Low  PLAN: PT FREQUENCY: 2x/week  PT DURATION: 4 weeks  PLANNED INTERVENTIONS: 97110-Therapeutic exercises, 97535- Self Care, 02859- Manual therapy, and 97597- Wound care (first 20 sq cm)  PLAN FOR NEXT SESSION: Most probable Discharge Montie Metro, PT CLT [phone]  06/15/2024, 4:42 PM

## 2024-06-19 ENCOUNTER — Ambulatory Visit (HOSPITAL_COMMUNITY)
Admission: RE | Admit: 2024-06-19 | Discharge: 2024-06-19 | Disposition: A | Discharge status: Home / Self Care | Source: Ambulatory Visit | Attending: Vascular Surgery | Admitting: Vascular Surgery

## 2024-06-19 DIAGNOSIS — I7143 Infrarenal abdominal aortic aneurysm, without rupture: Secondary | ICD-10-CM | POA: Insufficient documentation

## 2024-06-20 ENCOUNTER — Ambulatory Visit (HOSPITAL_COMMUNITY): Admitting: Physical Therapy

## 2024-06-20 ENCOUNTER — Ambulatory Visit: Admitting: Vascular Surgery

## 2024-06-20 DIAGNOSIS — S51802D Unspecified open wound of left forearm, subsequent encounter: Secondary | ICD-10-CM | POA: Diagnosis not present

## 2024-06-20 DIAGNOSIS — M79602 Pain in left arm: Secondary | ICD-10-CM

## 2024-06-20 NOTE — Therapy (Signed)
 OUTPATIENT PHYSICAL THERAPY Wound Treatment   Patient Name: Steve Bailey MRN: [id number] DOB:1934/09/14, 89 y.o., male Today's Date: 06/20/2024   PCP: [facility]  REFERRING PROVIDER: Clemencia Pac, MD  END OF SESSION:  PT End of Session - 06/20/24 1539     Visit Number 4    Number of Visits 4    Date for PT Re-Evaluation 07/09/24    Authorization Type medicare/ BCBS    PT Start Time 1445    PT Stop Time 1458    PT Time Calculation (min) 13 min    Activity Tolerance Patient tolerated treatment well    Behavior During Therapy WFL for tasks assessed/performed          Past Medical History:  Diagnosis Date   AAA (abdominal aortic aneurysm) (HCC) followed by dr laurence   last duplex 06/28/2018 4.6 cm,  asymptomatic   Anticoagulant long-term use    eliquis    Benign localized prostatic hyperplasia with lower urinary tract symptoms (LUTS)    CAD (coronary artery disease)    s/p CABG in 1997, patent grafts by cath in 2005, low-risk NST in 2012 and 12/2020   DDD (degenerative disc disease), cervical    Full dentures    History of radiation therapy 11/24/12-01/18/13   Prostate 78Gy/50fx   HOH (hard of hearing)    both   Hyperlipidemia    Hyperlipidemia    Hypertension    Permanent atrial fibrillation (HCC) 09/2012   Phimosis    severe   Proliferative diabetic retinopathy (HCC)    both eyes   Prostate cancer [facility]) urologist-- dr alvaro   dx 09-02-2012-- Stage T2a, Gleason 3+4=7, PSA 8.12, vol 103 cc--- completed external beam radiation 02/ 2014;  recurrent 2015 , started hormone therapy   S/P CABG x 3 1997   Strains to urinate    Type 2 diabetes mellitus treated with insulin  [facility])    endocrinologist-- dr von   Past Surgical History:  Procedure Laterality Date   CARDIAC CATHETERIZATION  01-18-2004   dr tisa   patent grafts, ef 55%, minimal anterior hypokinesis,  severe total occlusion of LCFx and LAD,  severe disease of D2 and segmental RCA    CARDIOVASCULAR STRESS TEST  01/07/2011   normal nuclear study w/ no ischemia/  normal LV function and wall motion,  ef 60%   CARPAL TUNNEL RELEASE Right    CATARACT EXTRACTION W/ INTRAOCULAR LENS  IMPLANT, BILATERAL  left 2006;  right 2010   CIRCUMCISION N/A 07/28/2018   Procedure: CIRCUMCISION ADULT;  Surgeon: [name], MD;  Location[facility]  Service: Urology;  Laterality: N/A;   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   CORONARY ANGIOPLASTY  1990   LAD   CORONARY ARTERY BYPASS GRAFT  11-1995   shows distal LAD disease, without recurrent symptoms of angina with questionable mild apical ischemia but with normal EF    ESOPHAGOGASTRODUODENOSCOPY (EGD) WITH ESOPHAGEAL DILATION  05/2012   KNEE ARTHROSCOPY Left    TRANSTHORACIC ECHOCARDIOGRAM  10/10/2012   ef 55%, akinesis of the inferolateral wall,  grade 2 diastolic dysfunction/  trivial AR/  mild MR and TR/  mild LAE/  mild RVSF reduced and mild dilated RV   Patient Active Problem List   Diagnosis Date Noted   Exudative age-related macular degeneration of left eye with active choroidal neovascularization (HCC) 12/14/2022   Fuchs' corneal dystrophy of left eye 12/14/2022   Status post corneal transplant 12/14/2022   Pancytopenia (HCC) 08/27/2022   Actinic  keratoses 08/09/2019   Proliferative diabetic retinopathy of both eyes with macular edema associated with type 2 diabetes mellitus (HCC) 02/25/2016   Pseudophakia of both eyes 02/25/2016   Crushing injury of right hand 12/25/2015   AAA (abdominal aortic aneurysm) without rupture (HCC) 12/18/2015   Non compliance w medication regimen 01/08/2014   Atrial fibrillation (HCC)    Prostate cancer (HCC)    Adult onset hypothyroidism 10/10/2012   Hypertension 10/10/2012   Diabetic macular edema (HCC) 07/22/2012   Macula scar of posterior pole 07/22/2012   Coronary artery disease 03/11/2012   Hyperlipidemia 03/11/2012   Type 2 diabetes mellitus with hyperlipidemia (HCC)  03/11/2012   Proliferative diabetic retinopathy (HCC) 12/29/2011    ONSET DATE: 05/29/24  REFERRING DIAG: Lt forearm abnormal wound healing  THERAPY DIAG:  Open wound Left forearm Lt arm pain   Rationale for Evaluation and Treatment: Rehabilitation      Wound Therapy - 06/20/24 0001     Subjective PT states he feels very well    Patient and Family Stated Goals wound to heal    Date of Onset 05/29/24    Prior Treatments urgent care, antibiotic, self care    Pain Scale 0-10    Evaluation and Treatment Procedures Explained to Patient/Family Yes    Evaluation and Treatment Procedures agreed to    Wound Properties Date First Assessed: 06/09/24 Time First Assessed: 1555 Present on Original Admission: Yes Primary Wound Type: Traumatic Secondary Wound Type - Traumatic: Abrasion Location: Arm Location Orientation: Left Wound Outcome: Healed Final Assessment Date: 06/20/24 Final Assessment Time: 1450   Dressing Type --   Medihoney, 4x4, vaseline/lotion perimeter, kerlix, coban, netting   Wound Properties Date First Assessed: 06/09/24 Time First Assessed: 1600 Present on Original Admission: Yes Primary Wound Type: Traumatic Location: Arm , superior to larger wound  Location Orientation: Left;Proximal Wound Outcome: Healed Final Assessment Date: 06/20/24 Final Assessment Time: 1450   Dressing Type --   vaseline to periphery, medihoney to wound, 4x4, kling, coban and netting   Wound Therapy - Clinical Statement see below    Wound Therapy - Functional Problem List difficultin using arm due to pain    Factors Delaying/Impairing Wound Healing Diabetes Mellitus;Infection - systemic/local    Hydrotherapy Plan Debridement;Dressing change;Patient/family education    Wound Therapy - Frequency 2X / week   for 4 weeks   Wound Therapy - Current Recommendations PT    Wound Plan Discharge    Dressing  Therapist placed 4x4 followed by 3 kling and netting at request of daughter due to dog jumping up on  patient.               PATIENT EDUCATION: Education details: Keep dressing dry try not to remove until he returns on Monday.  Person educated: Patient Education method: Explanation, Demonstration, and Handouts Education comprehension: verbalized understanding   HOME EXERCISE PROGRAM: N/A at this time.    GOALS: Goals reviewed with patient? No  SHORT TERM GOALS: Target date: 06/22/24  Pt wound to be 100% granulated Baseline: Goal status: MET following debridement  2.  Pt pain to be no greater that 5/10 Baseline:  Goal status: MET  LONG TERM GOALS: Target date: 06/23/24  Pt wound to be healed Baseline:  Goal status: MET  2.  Pt to have no pain in LT forearm  Baseline:  Goal status: MET   ASSESSMENT:  CLINICAL IMPRESSION: PT wounds have healed.  There was devitalized tissue that was easily removed with a 4x4.  Therapist explained that this new skin would be more susceptible to sunburn and the pt should use 70-100 spf for the first month to prevent burning of the skin.   OBJECTIVE IMPAIRMENTS: increased fascial restrictions, pain, and decreased skin integrity.   ACTIVITY LIMITATIONS: dressing and hygiene/grooming  PARTICIPATION LIMITATIONS: yard work  PERSONAL FACTORS: Age, Fitness, Time since onset of injury/illness/exacerbation, and 1-2 comorbidities: CAD, DM are also affecting patient's functional outcome.   REHAB POTENTIAL: Good  CLINICAL DECISION MAKING: Stable/uncomplicated  EVALUATION COMPLEXITY: Low  PLAN: PT FREQUENCY: 2x/week  PT DURATION: 4 weeks  PLANNED INTERVENTIONS: 97110-Therapeutic exercises, 97535- Self Care, 02859- Manual therapy, and 97597- Wound care (first 20 sq cm)  PLAN FOR NEXT SESSION: N/A discharge today  Montie Metro, PT CLT [phone]  06/20/2024, 3:42 PM

## 2024-07-13 ENCOUNTER — Ambulatory Visit (HOSPITAL_COMMUNITY): Admitting: Physical Therapy

## 2024-07-17 NOTE — Progress Notes (Unsigned)
 Patient name: Steve Bailey MRN: [id number] DOB: 1934/01/21 Sex: male  REASON FOR VISIT: F/U after CTA for AAA  HPI: Steve Bailey is a 89 y.o. male with history of diabetes, hypertension, hyperlipidemia, prostate cancer, A. Fib on eliquis , known abdominal aortic aneurysm, coronary disease status post CABG that presents for follow-up after CTA for further evaluation of his AAA.    Last clinic visit duplex suggest continued growth of his aneurysm now 5.6 cm.  I sent him for CT.    Past Medical History:  Diagnosis Date   AAA (abdominal aortic aneurysm) (HCC) followed by dr laurence   last duplex 06/28/2018 4.6 cm,  asymptomatic   Anticoagulant long-term use    eliquis    Benign localized prostatic hyperplasia with lower urinary tract symptoms (LUTS)    CAD (coronary artery disease)    s/p CABG in 1997, patent grafts by cath in 2005, low-risk NST in 2012 and 12/2020   DDD (degenerative disc disease), cervical    Full dentures    History of radiation therapy 11/24/12-01/18/13   Prostate 78Gy/20fx   HOH (hard of hearing)    both   Hyperlipidemia    Hyperlipidemia    Hypertension    Permanent atrial fibrillation (HCC) 09/2012   Phimosis    severe   Proliferative diabetic retinopathy (HCC)    both eyes   Prostate cancer [facility]) urologist-- dr alvaro   dx 09-02-2012-- Stage T2a, Gleason 3+4=7, PSA 8.12, vol 103 cc--- completed external beam radiation 02/ 2014;  recurrent 2015 , started hormone therapy   S/P CABG x 3 1997   Strains to urinate    Type 2 diabetes mellitus treated with insulin  [facility])    endocrinologist-- dr von    Past Surgical History:  Procedure Laterality Date   CARDIAC CATHETERIZATION  01-18-2004   dr tisa   patent grafts, ef 55%, minimal anterior hypokinesis,  severe total occlusion of LCFx and LAD,  severe disease of D2 and segmental RCA   CARDIOVASCULAR STRESS TEST  01/07/2011   normal nuclear study w/ no ischemia/  normal LV function and wall motion,  ef 60%    CARPAL TUNNEL RELEASE Right    CATARACT EXTRACTION W/ INTRAOCULAR LENS  IMPLANT, BILATERAL  left 2006;  right 2010   CIRCUMCISION N/A 07/28/2018   Procedure: CIRCUMCISION ADULT;  Surgeon: [name], MD;  Location: [facility];  Service: Urology;  Laterality: N/A;   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   CORONARY ANGIOPLASTY  1990   LAD   CORONARY ARTERY BYPASS GRAFT  11-1995   shows distal LAD disease, without recurrent symptoms of angina with questionable mild apical ischemia but with normal EF    ESOPHAGOGASTRODUODENOSCOPY (EGD) WITH ESOPHAGEAL DILATION  05/2012   KNEE ARTHROSCOPY Left    TRANSTHORACIC ECHOCARDIOGRAM  10/10/2012   ef 55%, akinesis of the inferolateral wall,  grade 2 diastolic dysfunction/  trivial AR/  mild MR and TR/  mild LAE/  mild RVSF reduced and mild dilated RV    Family History  Problem Relation Age of Onset   Stroke Father    Heart attack Mother    Heart disease Mother    Cancer Sister        lung   Cancer Brother        prostate   Diabetes Brother    Stroke Brother    Stroke Brother    Heart disease Brother    Heart disease Brother    Heart attack Sister  Heart disease Sister    Heart attack Sister    Cancer Sister        breast   Heart disease Sister    Heart attack Sister    Heart disease Sister    Heart disease Sister     SOCIAL HISTORY: Social History   Tobacco Use   Smoking status: Former    Current packs/day: 0.00    Types: Cigarettes    Start date: 10/14/1959    Quit date: 10/14/1979    Years since quitting: 44.7   Smokeless tobacco: Former    Types: Chew    Quit date: 11/23/2012  Substance Use Topics   Alcohol use: No    Allergies  Allergen Reactions   Cephalexin  Nausea And Vomiting   Tramadol  Palpitations    Current Outpatient Medications  Medication Sig Dispense Refill   acetaminophen  (TYLENOL ) 500 MG tablet Take 1,300 mg by mouth at bedtime.     alfuzosin (UROXATRAL) 10 MG 24 hr tablet Take 10 mg  by mouth at bedtime. Reported on 12/18/2015     amLODipine  (NORVASC ) 10 MG tablet TAKE 1 TABLET DAILY 90 tablet 3   atorvastatin  (LIPITOR) 40 MG tablet TAKE 1 TABLET DAILY 90 tablet 3   Blood Glucose Monitoring Suppl (FREESTYLE LITE) w/Device KIT 1 Device by Does not apply route 2 (two) times daily. 1 kit 0   Cinnamon 500 MG capsule Take 500 mg by mouth 2 (two) times daily.     Continuous Glucose Sensor (FREESTYLE LIBRE 3 SENSOR) MISC 1 each by Does not apply route every 14 (fourteen) days. 6 each 1   ELIQUIS  5 MG TABS tablet TAKE 1 TABLET TWICE A DAY 180 tablet 3   ezetimibe  (ZETIA ) 10 MG tablet TAKE 1 TABLET DAILY 90 tablet 3   ferrous sulfate 325 (65 FE) MG tablet Take 1 tablet by mouth daily.     finasteride (PROSCAR) 5 MG tablet Take 5 mg by mouth every morning. Reported on 12/18/2015     Glucagon  (GVOKE HYPOPEN  1-PACK) 1 MG/0.2ML SOAJ Inject 1 mg into the skin as needed (low blood sugar with impaired consciousness). 0.4 mL 2   glucose blood (FREESTYLE LITE) test strip USE TO TEST TWICE A DAY 100 strip 2   Insulin  Lispro Prot & Lispro (HUMALOG  MIX 75/25 KWIKPEN) (75-25) 100 UNIT/ML Kwikpen 12 units with breakfast and 8 units with supper. 30 mL 4   Insulin  Pen Needle (PEN NEEDLES) 31G X 5 MM MISC 1 each by Does not apply route 2 (two) times daily. 180 each 2   lisinopril  (ZESTRIL ) 20 MG tablet TAKE 1 TABLET DAILY 90 tablet 3   metFORMIN  (GLUCOPHAGE -XR) 750 MG 24 hr tablet Take 1 tablet (750 mg total) by mouth 2 (two) times daily with a meal. (Patient not taking: Reported on 06/13/2024) 180 tablet 3   nitroGLYCERIN  (NITROSTAT ) 0.4 MG SL tablet Place 1 tablet (0.4 mg total) under the tongue every 5 (five) minutes as needed for chest pain. 25 tablet 3   Omega-3 Fatty Acids (OMEGA-3 FISH OIL PO) Take 1 capsule by mouth daily.     pantoprazole  (PROTONIX ) 40 MG tablet TAKE 1 TABLET DAILY 90 tablet 3   prednisoLONE acetate (PRED FORTE) 1 % ophthalmic suspension PLACE 1 DROP INTO THE RIGHT EYE DAILY.      No current facility-administered medications for this visit.    REVIEW OF SYSTEMS:  [X]  denotes positive finding, [ ]  denotes negative finding Cardiac  Comments:  Chest pain or chest pressure:  Shortness of breath upon exertion:    Short of breath when lying flat:    Irregular heart rhythm:        Vascular    Pain in calf, thigh, or hip brought on by ambulation:    Pain in feet at night that wakes you up from your sleep:     Blood clot in your veins:    Leg swelling:         Pulmonary    Oxygen at home:    Productive cough:     Wheezing:         Neurologic    Sudden weakness in arms or legs:     Sudden numbness in arms or legs:     Sudden onset of difficulty speaking or slurred speech:    Temporary loss of vision in one eye:     Problems with dizziness:         Gastrointestinal    Blood in stool:     Vomited blood:         Genitourinary    Burning when urinating:     Blood in urine:        Psychiatric    Major depression:         Hematologic    Bleeding problems:    Problems with blood clotting too easily:        Skin    Rashes or ulcers:        Constitutional    Fever or chills:      PHYSICAL EXAM: There were no vitals filed for this visit.     GENERAL: The patient is a well-nourished male, in no acute distress. The vital signs are documented above. CARDIAC: There is a regular rate and rhythm.  VASCULAR:  2+ palpable femoral pulse bilaterally No palpable pedal pulses PULMONARY: No respiratory distress. ABDOMEN: Soft and non-tender.  No pain with palpation of aneurysm. MUSCULOSKELETAL: There are no major deformities or cyanosis. NEUROLOGIC: No focal weakness or paresthesias are detected. SKIN: There are no ulcers or rashes noted. PSYCHIATRIC: The patient has a normal affect.  DATA:   CT abd/pelvis reviewed 06/19/24 with 5.5 cm AAA  AAA duplex 06/13/24 showed aneurysm 5.5 cm --> 5.56 cm over past 6 months by duplex  CTA 05/11/23: 5.0 cm  AAA  Assessment/Plan:  89 y.o. male with history of diabetes, hypertension, hyperlipidemia, prostate cancer, A. Fib on eliquis , known abdominal aortic aneurysm, coronary disease status post CABG that presents for follow-up after CT for further evaluation of his AAA.    Discussed his aneurysm now measures 5.5 cm after review of recent CT.  I have recommended stent graft repair.  I discussed in men we repair these greater than 5.5 cm given risk of rupture.  I discussed he currently has a 10 to 15% yearly rupture risk.  He certainly meets criteria for repair.  Discussed transfemoral access in the operating room with stent graft repair.  Risk benefits discussed including risk of vessel injury, heart attack, stroke, infection, risk of anesthesia etc. he will have to hold his Eliquis .  He is seeing his cardiologist tomorrow.  Lonni DOROTHA Gaskins, MD [facility] Office: [phone]

## 2024-07-18 ENCOUNTER — Ambulatory Visit (HOSPITAL_COMMUNITY): Admitting: Physical Therapy

## 2024-07-18 ENCOUNTER — Encounter: Payer: Self-pay | Admitting: Vascular Surgery

## 2024-07-18 ENCOUNTER — Ambulatory Visit: Attending: Vascular Surgery | Admitting: Vascular Surgery

## 2024-07-18 VITALS — BP 140/75 | HR 72 | Temp 97.5°F | Resp 18 | Ht 72.0 in | Wt 197.3 lb

## 2024-07-18 DIAGNOSIS — I7143 Infrarenal abdominal aortic aneurysm, without rupture: Secondary | ICD-10-CM | POA: Diagnosis not present

## 2024-07-19 ENCOUNTER — Encounter: Payer: Self-pay | Admitting: Internal Medicine

## 2024-07-19 ENCOUNTER — Ambulatory Visit: Attending: Internal Medicine | Admitting: Internal Medicine

## 2024-07-19 ENCOUNTER — Telehealth: Payer: Self-pay | Admitting: Internal Medicine

## 2024-07-19 ENCOUNTER — Telehealth: Payer: Self-pay

## 2024-07-19 VITALS — BP 166/70 | HR 64 | Wt 198.0 lb

## 2024-07-19 DIAGNOSIS — Z01810 Encounter for preprocedural cardiovascular examination: Secondary | ICD-10-CM | POA: Diagnosis present

## 2024-07-19 DIAGNOSIS — M7989 Other specified soft tissue disorders: Secondary | ICD-10-CM | POA: Diagnosis present

## 2024-07-19 DIAGNOSIS — I4821 Permanent atrial fibrillation: Secondary | ICD-10-CM | POA: Insufficient documentation

## 2024-07-19 DIAGNOSIS — I951 Orthostatic hypotension: Secondary | ICD-10-CM

## 2024-07-19 DIAGNOSIS — I2510 Atherosclerotic heart disease of native coronary artery without angina pectoris: Secondary | ICD-10-CM | POA: Diagnosis present

## 2024-07-19 NOTE — Patient Instructions (Addendum)
 Medication Instructions:  Take Amlodipine  and Lisinopril  at NIGHT  Labwork: None today  Testing/Procedures: Your physician has requested that you have an echocardiogram. Echocardiography is a painless test that uses sound waves to create images of your heart. It provides your doctor with information about the size and shape of your heart and how well your heart's chambers and valves are working. This procedure takes approximately one hour. There are no restrictions for this procedure. Please do NOT wear cologne, perfume, aftershave, or lotions (deodorant is allowed). Please arrive 15 minutes prior to your appointment time.  Please note: We ask at that you not bring children with you during ultrasound (echo/ vascular) testing. Due to room size and safety concerns, children are not allowed in the ultrasound rooms during exams. Our front office staff cannot provide observation of children in our lobby area while testing is being conducted. An adult accompanying a patient to their appointment will only be allowed in the ultrasound room at the discretion of the ultrasound technician under special circumstances. We apologize for any inconvenience.       Your physician has requested that you have a lexiscan  myoview . For further information please visit [email or web address] Please follow instruction sheet, as given.   Follow-Up: 6 months  Any Other Special Instructions Will Be Listed Below (If Applicable).   DRINK 5 BOTTLES OF WATER DAILY  If you need a refill on your cardiac medications before your next appointment, please call your pharmacy.

## 2024-07-19 NOTE — Progress Notes (Signed)
 Cardiology Office Note  Date: 07/19/2024   ID: LEVIATHAN MACERA, DOB Jun 11, 1934, MRN [id number]  PCP:  [facility]  Cardiologist:  None Electrophysiologist:  None   History of Present Illness: Steve Bailey is a 89 y.o. male  Patient is here for follow-up visit of CAD, A-fib, HTN with orthostatic hypotension.  Patient has dizziness from sitting to standing position.  Orthostatics today are positive for orthostatic hypotension.  Does not have any angina or DOE.  No syncope, fatigue, palpitations.  He has some leg swelling.  Past Medical History:  Diagnosis Date   AAA (abdominal aortic aneurysm) (HCC) followed by dr laurence   last duplex 06/28/2018 4.6 cm,  asymptomatic   Anticoagulant long-term use    eliquis    Benign localized prostatic hyperplasia with lower urinary tract symptoms (LUTS)    CAD (coronary artery disease)    s/p CABG in 1997, patent grafts by cath in 2005, low-risk NST in 2012 and 12/2020   DDD (degenerative disc disease), cervical    Full dentures    History of radiation therapy 11/24/12-01/18/13   Prostate 78Gy/47fx   HOH (hard of hearing)    both   Hyperlipidemia    Hyperlipidemia    Hypertension    Permanent atrial fibrillation (HCC) 09/2012   Phimosis    severe   Proliferative diabetic retinopathy (HCC)    both eyes   Prostate cancer [facility]) urologist-- dr alvaro   dx 09-02-2012-- Stage T2a, Gleason 3+4=7, PSA 8.12, vol 103 cc--- completed external beam radiation 02/ 2014;  recurrent 2015 , started hormone therapy   S/P CABG x 3 1997   Strains to urinate    Type 2 diabetes mellitus treated with insulin  [facility])    endocrinologist-- dr von    Past Surgical History:  Procedure Laterality Date   CARDIAC CATHETERIZATION  01-18-2004   dr tisa   patent grafts, ef 55%, minimal anterior hypokinesis,  severe total occlusion of LCFx and LAD,  severe disease of D2 and segmental RCA   CARDIOVASCULAR STRESS TEST  01/07/2011   normal  nuclear study w/ no ischemia/  normal LV function and wall motion,  ef 60%   CARPAL TUNNEL RELEASE Right    CATARACT EXTRACTION W/ INTRAOCULAR LENS  IMPLANT, BILATERAL  left 2006;  right 2010   CIRCUMCISION N/A 07/28/2018   Procedure: CIRCUMCISION ADULT;  Surgeon: [name], MD;  Location: [facility];  Service: Urology;  Laterality: N/A;   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   CORONARY ANGIOPLASTY  1990   LAD   CORONARY ARTERY BYPASS GRAFT  11-1995   shows distal LAD disease, without recurrent symptoms of angina with questionable mild apical ischemia but with normal EF    ESOPHAGOGASTRODUODENOSCOPY (EGD) WITH ESOPHAGEAL DILATION  05/2012   KNEE ARTHROSCOPY Left    TRANSTHORACIC ECHOCARDIOGRAM  10/10/2012   ef 55%, akinesis of the inferolateral wall,  grade 2 diastolic dysfunction/  trivial AR/  mild MR and TR/  mild LAE/  mild RVSF reduced and mild dilated RV    Current Outpatient Medications  Medication Sig Dispense Refill   acetaminophen  (TYLENOL ) 500 MG tablet Take 1,300 mg by mouth at bedtime.     alfuzosin (UROXATRAL) 10 MG 24 hr tablet Take 10 mg by mouth at bedtime. Reported on 12/18/2015     amLODipine  (NORVASC ) 10 MG tablet TAKE 1 TABLET DAILY 90 tablet 3   atorvastatin  (LIPITOR) 40 MG tablet TAKE 1 TABLET DAILY 90 tablet 3  Blood Glucose Monitoring Suppl (FREESTYLE LITE) w/Device KIT 1 Device by Does not apply route 2 (two) times daily. 1 kit 0   Cinnamon 500 MG capsule Take 500 mg by mouth 2 (two) times daily.     Continuous Glucose Sensor (FREESTYLE LIBRE 3 SENSOR) MISC 1 each by Does not apply route every 14 (fourteen) days. 6 each 1   ELIQUIS  5 MG TABS tablet TAKE 1 TABLET TWICE A DAY 180 tablet 3   ezetimibe  (ZETIA ) 10 MG tablet TAKE 1 TABLET DAILY 90 tablet 3   ferrous sulfate 325 (65 FE) MG tablet Take 1 tablet by mouth daily.     finasteride (PROSCAR) 5 MG tablet Take 5 mg by mouth every morning. Reported on 12/18/2015     Glucagon  (GVOKE HYPOPEN   1-PACK) 1 MG/0.2ML SOAJ Inject 1 mg into the skin as needed (low blood sugar with impaired consciousness). 0.4 mL 2   glucose blood (FREESTYLE LITE) test strip USE TO TEST TWICE A DAY 100 strip 2   Insulin  Lispro Prot & Lispro (HUMALOG  MIX 75/25 KWIKPEN) (75-25) 100 UNIT/ML Kwikpen 12 units with breakfast and 8 units with supper. 30 mL 4   Insulin  Pen Needle (PEN NEEDLES) 31G X 5 MM MISC 1 each by Does not apply route 2 (two) times daily. 180 each 2   lisinopril  (ZESTRIL ) 20 MG tablet TAKE 1 TABLET DAILY 90 tablet 3   metFORMIN  (GLUCOPHAGE -XR) 750 MG 24 hr tablet Take 1 tablet (750 mg total) by mouth 2 (two) times daily with a meal. 180 tablet 3   nitroGLYCERIN  (NITROSTAT ) 0.4 MG SL tablet Place 1 tablet (0.4 mg total) under the tongue every 5 (five) minutes as needed for chest pain. 25 tablet 3   Omega-3 Fatty Acids (OMEGA-3 FISH OIL PO) Take 1 capsule by mouth daily.     pantoprazole  (PROTONIX ) 40 MG tablet TAKE 1 TABLET DAILY 90 tablet 3   prednisoLONE acetate (PRED FORTE) 1 % ophthalmic suspension PLACE 1 DROP INTO THE RIGHT EYE DAILY.     No current facility-administered medications for this visit.   Allergies:  Cephalexin  and Tramadol    Social History: The patient  reports that he quit smoking about 44 years ago. His smoking use included cigarettes. He started smoking about 64 years ago. He quit smokeless tobacco use about 11 years ago.  His smokeless tobacco use included chew. He reports that he does not drink alcohol and does not use drugs.   Family History: The patient's family history includes Cancer in his brother, sister, and sister; Diabetes in his brother; Heart attack in his mother, sister, sister, and sister; Heart disease in his brother, brother, mother, sister, sister, sister, and sister; Stroke in his brother, brother, and father.   ROS:  Please see the history of present illness. Otherwise, complete review of systems is positive for none.  All other systems are reviewed and  negative.   Physical Exam: VS:  BP (!) 166/70 (BP Location: Right Arm, Cuff Size: Normal)   Pulse 64   Wt 198 lb (89.8 kg)   BMI 26.85 kg/m , BMI Body mass index is 26.85 kg/m.  Wt Readings from Last 3 Encounters:  07/19/24 198 lb (89.8 kg)  07/18/24 197 lb 4.8 oz (89.5 kg)  06/13/24 197 lb 12.8 oz (89.7 kg)    General: Patient appears comfortable at rest. HEENT: Conjunctiva and lids normal, oropharynx clear with moist mucosa. Neck: Supple, no elevated JVP or carotid bruits, no thyromegaly. Lungs: Clear to auscultation, nonlabored  breathing at rest. Cardiac: Regular rate and rhythm, no S3 or significant systolic murmur, no pericardial rub. Abdomen: Soft, nontender, no hepatomegaly, bowel sounds present, no guarding or rebound. Extremities: Mild pitting edema, distal pulses 2+. Skin: Warm and dry. Musculoskeletal: No kyphosis. Neuropsychiatric: Alert and oriented x3, affect grossly appropriate.  Recent Labwork: 05/01/2024: BUN 19; Creat 1.10; Potassium 4.2; Sodium 142     Component Value Date/Time   CHOL 150 01/07/2023 0926   TRIG 101.0 01/07/2023 0926   HDL 51.80 01/07/2023 0926   CHOLHDL 3 01/07/2023 0926   VLDL 20.2 01/07/2023 0926   LDLCALC 78 01/07/2023 0926     Assessment and Plan:  Pre-op cardiac risk stratification for EVAR - Obtain Echo and Lexiscan .  Preop recommendations pending Echo and Lexiscan . - Hold Eliquis  for 2 to 3 days prior to EVAR and resume when safe from bleeding standpoint.  Orthostatic hypotension with supine HTN - Orthostatics positive for orthostatic hypotension today in the clinic. - Strongly encourage p.o. hydration.  DC from sitting to standing position. - Take current antihypertensive medications at bedtime, amlodipine  10 mg once daily and lisinopril  20 mg once daily.  CAD s/p CABG in 1997 - Low risk NST in 2022.  Repeat Lexiscan . - Asymptomatic.  No angina or DOE. - Not on aspirin due to Eliquis  use, continue atorvastatin  40 mg  nightly and Zetia  10 mg once daily.  Permanent atrial fibrillation - Asymptomatic. - EKG today showed rate controlled atrial fibrillation, HR 60s. - Not on rate controlling agents. - Continue Eliquis  5 mg twice daily.  AAA 5.5 cm - Pending EVAR, follows up with vascular surgery.   I spent 30 minutes reviewing prior records, imaging, test, more than 3 labs, discussion of preop clearance recommendations, CAD, permanent A-fib, HTN with the patient and documentation.   Medication Adjustments/Labs and Tests Ordered: Current medicines are reviewed at length with the patient today.  Concerns regarding medicines are outlined above.    Disposition:  Follow up 6 months   Signed, Lymon Kidney Arleta Maywood, MD, 07/19/2024 4:06 PM    [facility] at [facility] [address] [place], [place] [address]

## 2024-07-19 NOTE — Telephone Encounter (Signed)
 1st attempt to reach pt regarding surgical clearance and the need for an IN OFFICE appointment.  Left pt a detailed message to call back and get that scheduled.

## 2024-07-19 NOTE — Telephone Encounter (Signed)
   Name: Steve Bailey  DOB: 06-Mar-1934  MRN: [id number]  Primary Cardiologist: None  Chart reviewed as part of pre-operative protocol coverage. Because of Steve Bailey's past medical history and time since last visit, Steve Bailey will require a follow-up in-office visit in order to better assess preoperative cardiovascular risk.  Pre-op covering staff: - Please schedule appointment and call patient to inform them. If patient already had an upcoming appointment within acceptable timeframe, please add pre-op clearance to the appointment notes so provider is aware. - Please contact requesting surgeon's office via preferred method (i.e, phone, fax) to inform them of need for appointment prior to surgery.   NEEDS TO SCHEDULE TO ESTABLISH WITH DR STACIA    Jon Nat Hails, PA  07/19/2024, 2:54 PM

## 2024-07-19 NOTE — Telephone Encounter (Signed)
 Per patient's daughter, patient is having his 6 month cardiac follow up today.  They plan on asking MD for clearance prior to scheduling EVAR with Dr. Gretta.  This nurse will fax a cardiac clearance request to patient's cardiology office.

## 2024-07-19 NOTE — Telephone Encounter (Signed)
   Pre-operative Risk Assessment    Patient Name: Steve Bailey  DOB: 1934/04/23 MRN: [id number]      Request for Surgical Clearance    Procedure:  EVAR  Date of Surgery:  Clearance TBD                                 Surgeon:  Lonni Gaskins Surgeon's Group or Practice Name:  [facility] Phone number:  [phone] Fax number:  [phone]   Type of Clearance Requested:   - Medical  - Pharmacy:  Hold if applicable      Type of Anesthesia:  Not Indicated   Additional requests/questions:    SignedAlan KANDICE Corona   07/19/2024, 1:44 PM

## 2024-07-20 ENCOUNTER — Ambulatory Visit (HOSPITAL_COMMUNITY): Admitting: Physical Therapy

## 2024-07-25 ENCOUNTER — Ambulatory Visit (HOSPITAL_COMMUNITY): Admitting: Physical Therapy

## 2024-07-25 DIAGNOSIS — I951 Orthostatic hypotension: Secondary | ICD-10-CM | POA: Insufficient documentation

## 2024-07-26 ENCOUNTER — Ambulatory Visit (HOSPITAL_BASED_OUTPATIENT_CLINIC_OR_DEPARTMENT_OTHER)
Admission: RE | Admit: 2024-07-26 | Discharge: 2024-07-26 | Disposition: A | Discharge status: Home / Self Care | Source: Ambulatory Visit | Attending: Internal Medicine | Admitting: Internal Medicine

## 2024-07-26 ENCOUNTER — Other Ambulatory Visit: Payer: Self-pay | Admitting: Physician Assistant

## 2024-07-26 ENCOUNTER — Ambulatory Visit (HOSPITAL_COMMUNITY)
Admission: RE | Admit: 2024-07-26 | Discharge: 2024-07-26 | Disposition: A | Discharge status: Home / Self Care | Source: Ambulatory Visit | Attending: Cardiology | Admitting: Cardiology

## 2024-07-26 DIAGNOSIS — I2510 Atherosclerotic heart disease of native coronary artery without angina pectoris: Secondary | ICD-10-CM | POA: Insufficient documentation

## 2024-07-26 LAB — NM MYOCAR MULTI W/SPECT W/WALL MOTION / EF
LV dias vol: 104 mL (ref 62–150)
LV sys vol: 37 mL (ref 4.2–5.8)
MPHR: 132 {beats}/min
Nuc Stress EF: 64 %
Peak HR: 94 {beats}/min
Percent HR: 71 %
RATE: 0.4
Rest HR: 75 {beats}/min
Rest Nuclear Isotope Dose: 10.4 mCi
SDS: 3
SRS: 1
SSS: 4
ST Depression (mm): 0 mm
Stress Nuclear Isotope Dose: 29 mCi
TID: 0.9

## 2024-07-26 MED ORDER — REGADENOSON 0.4 MG/5ML IV SOLN
INTRAVENOUS | Status: AC
  Administered 2024-07-26: 0.4 mg via INTRAVENOUS
  Filled 2024-07-26: qty 5

## 2024-07-26 MED ORDER — TECHNETIUM TC 99M TETROFOSMIN IV KIT
10.0000 | PACK | Freq: Once | INTRAVENOUS | Status: AC | PRN
  Administered 2024-07-26: 10.4 via INTRAVENOUS

## 2024-07-26 MED ORDER — SODIUM CHLORIDE FLUSH 0.9 % IV SOLN
INTRAVENOUS | Status: AC
  Administered 2024-07-26: 10 mL via INTRAVENOUS
  Filled 2024-07-26: qty 10

## 2024-07-26 MED ORDER — TECHNETIUM TC 99M TETROFOSMIN IV KIT
30.0000 | PACK | Freq: Once | INTRAVENOUS | Status: AC | PRN
  Administered 2024-07-26: 29 via INTRAVENOUS

## 2024-07-26 NOTE — Telephone Encounter (Signed)
 OV preop clearance scheduled

## 2024-07-26 NOTE — Progress Notes (Signed)
     [place] KATHEE Irving presented for a Lexiscan  nuclear stress test today.  I Lorette CINDERELLA Kapur, PA-C, provided direct supervision and was present during the stress portion of the study today, which was completed without significant symptoms, immediate complications, or acute ST/T changes on ECG.  Stress imaging is pending at this time.  Preliminary ECG findings may be listed in the chart, but the stress test result will not be finalized until perfusion imaging is complete.  Lorette CINDERELLA Kapur, PA-C  07/26/2024, 11:02 AM

## 2024-07-27 ENCOUNTER — Ambulatory Visit (HOSPITAL_COMMUNITY): Admitting: Physical Therapy

## 2024-07-27 ENCOUNTER — Ambulatory Visit: Payer: Self-pay | Admitting: Internal Medicine

## 2024-08-03 NOTE — Progress Notes (Signed)
 Date of Service: 08/03/2024 Patient DOB: 04/16/1934   Subjective:     Patient ID: Steve Bailey is a 89 y.o. male. Patient comes in for Medicare Annual Wellness Visit Subsequent, Diabetes (Today blood sugars 115), Hypothyroidism, Hypertension, Hyperlipidemia, and Coronary Artery Disease The patient is an 89yo male who presents for annual Medicare wellness visit and medical evaluation. The patient has a history of:         -Abdominal aortic aneurysm: Currently seeing vascular surgery Dr. Lonni Gaskins 06/11/2024 CT abdomen/pelvis showed: 1. Infrarenal abdominal aortic aneurysm measuring 5.5 x 5.3 cm, increased from    5.4 x 5.0 cm on May 11, 2023. Pt is going to get stent graft repair        -hearing aids: has hearing aids but does not wear them.          -Pancytopenia: Currently on iron supplements.              -Vitamin B12 deficiency: Currently getting monthly injections         -Atrial fibrillation: Currently on Eliquis  5 mg twice a day, seeing cardiology Laymon Qua PA, 09/25 where patient had low risk nuclear stress test        -prostate cancer: urologist Dr. Alvaro (had undetectable PSA in 05/25), treated wtih radiation but then developed reoccurance, currently  on alfuzosin 10mg  daily and finasteride 5 mg daily, Currently on Eligard  injections.       -insomnia: using over the counter night time cough syrup. .          -Diabetic retinopathy bilaterally with macular edema: Currently seeing eye doctor Dr. 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          -Coronary artery disease: Status post CABG, currently seeing cardiology          -Dyslipidemia: Currently on atorvastatin  40 mg daily         -lives alone: daughter lives next to him; has cellphone.           -Diabetes mellitus: Currently seeing endocrinology Dr. Jean, currently on Humulin  70/30 18 units in AM and 14 units at dinner; 03/12/23 6.0    -abnormal thyroid  function: slightly elevated tsh in 09/25;   -irritated skin lesions on his back     C-scope: Over 67 years of age, no bloody or black stools.    PSA: History of prostate cancer seeing urology, 10/23 (0.09)    Dexa: No recent bone fractures.    Vaccinations: Patient has had both pneumonia vaccinations, due now for shingles, RSV, and flu vaccination.  Pt did get tetanus in 07/25;   Hep C screening: Over 10 years of age    Labs:    08/01/2024 elevated TSH of 5.577, low vitamin B12 at 234, A1c is 6.3, normal vitamin D at 34.4, CBC had low white blood cell count 3.7 from 3.9, low hemoglobin 11.5 from 11.9, low platelets of 109 from 116, CMP had elevated blood sugar 152, normal lipids.   05/25 CMP had mildly elevated blood sugars with history of diabetes.  Patient's kidney function has decreased (creatinine 1.45 from 1.03).  Patient CBC shows mildly low white blood cell count (3.9 from 3.8), mildly low hemoglobin (11.9 from 11.1) and mildly low platelets (116 from 138) which are all chronic and stable.   05/25 kidney function has improved and is close to baseline.   09/24  CMP showed elevated blood sugars with history of diabetes currently seeing endocrinology.  Patient's cholesterol, vitamin B12, and iron labs are normal.  Patient CBC showed mildly low white blood cell count (3.8 from 4.3), mildly low hemoglobin (11.1 from 11.6), and mildly low platelets (138 from 142) which are all chronic and stable, likely from prior radiation treatment   11/23 CBC showed improvement in both the white blood cell count (4.3 from 3.6) and platelets (142 from 131) which are almost back to normal now.   Patient's hemoglobin is mildly low (11.6 from 11.5) which is chronic and stable    10/23 cholesterol and iron labs are normal.  Patient's PSA lab is 0.09 which is slightly higher than the undetectable PSA level in May 2023.  Please send patient's PSA lab to his urologist Dr. Alvaro, CMP had elevated blood sugars with history of diabetes.  Patient CBC shows low white blood cell count (3.6 from 5.2) and low platelet count (131 from 170) which are both new.  Patient CBC was mildly low hemoglobin (11.5 from 1.6) which is chronic and stable.      07/23 A1c 7.1,    03/23 CMP had elevated blood sugar 190,    11/22 normal lipids and normal TSH    Misc:    Review of Systems Review of Systems  Constitutional:  Negative for chills, fatigue and fever.  HENT:  Positive for hearing loss. Negative for congestion, rhinorrhea and sore throat.   Eyes:  Negative for pain and visual disturbance.  Respiratory:  Negative for cough, shortness of breath and wheezing.   Cardiovascular:  Negative for chest pain, palpitations and leg swelling.  Gastrointestinal:  Negative for abdominal distention, abdominal pain, anal bleeding, blood in stool, constipation, diarrhea, nausea, rectal pain and vomiting.  Genitourinary:  Negative for decreased urine volume, dysuria and hematuria.  Musculoskeletal:  Negative for arthralgias, back pain, gait problem, joint swelling, myalgias, neck pain and neck stiffness.  Skin:  Negative for rash and wound.  Neurological:  Negative for dizziness, seizures, syncope, weakness and headaches.  Hematological:  Negative for adenopathy. Does not bruise/bleed easily.  Psychiatric/Behavioral:  Negative for agitation, behavioral problems, sleep disturbance and suicidal ideas. The patient is not nervous/anxious.      Medical History[1]  Surgical History[2]    Allergies[3]    Social History   Social History Narrative  . Not on file    Family History[4]    Objective:  BP 140/64    Pulse 60   Temp 97.8 F (36.6 C)   Ht 1.778 m (5' 10)   Wt 90.9 kg (200 lb 6.4 oz)   SpO2 92%   BMI 28.75 kg/m   Physical Exam Physical Exam Constitutional:      General: He is not in acute distress.    Appearance: Normal appearance. He is not ill-appearing.  HENT:     Head: Normocephalic and atraumatic.     Right Ear: Tympanic membrane, ear canal and external ear normal. There is no impacted cerumen.     Left Ear: Tympanic membrane, ear canal and external ear normal. There is no impacted cerumen.     Nose: Nose normal. No congestion or rhinorrhea.     Mouth/Throat:  Mouth: Mucous membranes are moist.     Pharynx: No oropharyngeal exudate or posterior oropharyngeal erythema.  Eyes:     General: No scleral icterus.       Right eye: No discharge.        Left eye: No discharge.     Extraocular Movements: Extraocular movements intact.     Conjunctiva/sclera: Conjunctivae normal.     Pupils: Pupils are equal, round, and reactive to light.  Neck:     Vascular: No carotid bruit.  Cardiovascular:     Rate and Rhythm: Normal rate and regular rhythm.     Heart sounds: No murmur heard. Pulmonary:     Effort: Pulmonary effort is normal. No respiratory distress.     Breath sounds: Normal breath sounds. No stridor. No wheezing, rhonchi or rales.  Chest:     Chest wall: No tenderness.  Abdominal:     General: There is no distension.     Palpations: Abdomen is soft. There is no mass.     Tenderness: There is no abdominal tenderness. There is no right CVA tenderness, left CVA tenderness, guarding or rebound.     Hernia: No hernia is present.  Musculoskeletal:        General: No swelling, tenderness, deformity or signs of injury.     Cervical back: Neck supple. No tenderness.     Right lower leg: No edema.     Left lower leg: No edema.  Lymphadenopathy:     Cervical: No cervical adenopathy.  Skin:    Coloration: Skin is not jaundiced or pale.     Findings: Lesion present. No  bruising, erythema or rash.     Comments: 3 irritated SK lesions noted on the mid back;   Neurological:     General: No focal deficit present.     Mental Status: He is alert and oriented to person, place, and time.     Cranial Nerves: No cranial nerve deficit.     Motor: No weakness.     Gait: Gait normal.  Psychiatric:        Mood and Affect: Mood normal.        Behavior: Behavior normal.        Thought Content: Thought content normal.        Judgment: Judgment normal.      Cryotherapy, skin lesion  Date/Time: 08/03/2024 1:00 PM  Performed by: Shawn P Lazoff, DO Authorized by: Shawn P Lazoff, DO  Local anesthesia used: no  Anesthesia: Local anesthesia used: no  Sedation: Patient sedated: no  Comments: Cryotherapy Risks including redness, blistering, pain, no improvement, recurrence, future procedures versus benefits was discussed and an informed verbal consent was obtained; using liquid nitrogen, the 3 areas on the mid thoracic area were frozen twice without any complications or complaints.          Labs: Recent Results (from the past week)  Lipid Panel   Collection Time: 08/01/24  9:45 AM  Result Value Ref Range   Cholesterol, Total, Lipid Panel 100 <200 mg/dL   Triglycerides, Lipid Panel 65 <150 mg/dL   HDL Cholesterol - Lipid Panel 51 (L) >=60 mg/dL   LDL Cholesterol, Calculated 35 <100 mg/dL   Non-HDL Cholesterol 49 mg/dL  Comprehensive Metabolic Panel   Collection Time: 08/01/24  9:45 AM  Result Value Ref Range   Sodium 141 136 - 145 mmol/L   Potassium 4.3 3.5 - 5.1 mmol/L   Chloride 106 98 - 107 mmol/L   CO2 25  21 - 31 mmol/L   Anion Gap 10 6 - 14 mmol/L   Glucose, Random 152 (H) 70 - 99 mg/dL   Blood Urea  Nitrogen (BUN) 18 7 - 25 mg/dL   Creatinine 8.92 9.29 - 1.30 mg/dL   eGFR 67 >40 fO/fpw/8.26f7   Albumin 4.0 3.5 - 5.7 g/dL   Total Protein 6.1 (L) 6.4 - 8.9 g/dL   Bilirubin, Total 0.8 0.3 - 1.0 mg/dL   Alkaline Phosphatase (ALP) 80 34 - 104  U/L   Aspartate Aminotransferase (AST) 24 13 - 39 U/L   Alanine Aminotransferase (ALT) 25 7 - 52 U/L   Calcium  9.1 8.6 - 10.3 mg/dL   BUN/Creatinine Ratio    Vitamin D, 25-Hydroxy   Collection Time: 08/01/24  9:45 AM  Result Value Ref Range   Vitamin D 25-Hydroxy 34.4 30.0 - 100.0 ng/mL  Hemoglobin A1C With Estimated Average Glucose   Collection Time: 08/01/24  9:45 AM  Result Value Ref Range   Hemoglobin A1c 6.3 (H) <5.7 %   Estimated Average Glucose 134 mg/dL  Vitamin B12   Collection Time: 08/01/24  9:45 AM  Result Value Ref Range   Vitamin B-12 234 180 - 914 pg/mL  TSH   Collection Time: 08/01/24  9:45 AM  Result Value Ref Range   TSH 5.577 (H) 0.450 - 5.330 uIU/mL  CBC with Differential   Collection Time: 08/01/24  9:45 AM  Result Value Ref Range   WBC 3.70 (L) 4.40 - 11.00 10*3/uL   RBC 3.36 (L) 4.50 - 5.90 10*6/uL   Hemoglobin 11.5 (L) 14.0 - 17.5 g/dL   Hematocrit 66.9 (L) 58.4 - 50.4 %   Mean Corpuscular Volume (MCV) 98.2 (H) 80.0 - 96.0 fL   Mean Corpuscular Hemoglobin (MCH) 34.3 (H) 27.5 - 33.2 pg   Mean Corpuscular Hemoglobin Conc (MCHC) 34.9 33.0 - 37.0 g/dL   Red Cell Distribution Width (RDW) 14.1 12.3 - 17.0 %   Platelet Count (PLT) 109 (L) 150 - 450 10*3/uL   Mean Platelet Volume (MPV) 11.2 (H) 6.8 - 10.2 fL   Neutrophils % 61 %   Lymphocytes % 28 %   Monocytes % 9 %   Eosinophils % 2 %   Basophils % 0 %   nRBC % 0 %   Neutrophils Absolute 2.30 1.80 - 7.80 10*3/uL   Lymphocytes # 1.00 1.00 - 4.80 10*3/uL   Monocytes # 0.30 0.00 - 0.80 10*3/uL   Eosinophils # 0.10 0.00 - 0.50 10*3/uL   Basophils # 0.00 0.00 - 0.20 10*3/uL   nRBC Absolute 0.00 <=0.00 10*3/uL    Assessment/Plan:  1. Encounter for Medicare annual wellness exam (Primary) Medicare questions were answered and reviewed. Went over recent lab results. Gave the flu vaccine and Vitamin B12 injection today.  Go to the pharmamcy if you would ever want the shingles and/or RSV vaccine.   2.  Abdominal aortic aneurysm (AAA) without rupture, unspecified part Patient is going to have aneurysmal stent placement.  Follow-up with cardiology and vascular surgeon as directed.  3. Pancytopenia (CMD) Chronic and stable continue to observe.  4. Vitamin B12 deficiency Currently on monthly vitamin B12 injections. Gave vitamin B12 injection today. - cyanocobalamin  (VITAMIN B12) injection 1,000 mcg  5. Atrial fibrillation, unspecified type    (CMD) Appears to be stable, continue medication follow with cardiology as directed.  6. Insomnia, unspecified type Stable with over-the-counter sleep aid, call if sleep issue gets worse.  7. Proliferative diabetic retinopathy of both eyes with macular  edema associated with type 2 diabetes mellitus    (CMD) Continue to follow with the eye doctor as directed.  8. Essential hypertension controlled, continue current medication  9. Coronary artery disease involving native coronary artery of native heart without angina pectoris Continue medication and follow-up cardiology as directed.  10. Dyslipidemia Controlled, continue atorvastatin  as directed.  11. Type 2 diabetes mellitus with both eyes affected by proliferative retinopathy and macular edema, with long-term current use of insulin     (CMD) Controlled, continue medication follow with endocrinology as directed.  12. Inflamed seborrheic keratosis It appears that patient had 3 inflamed seborrheic keratosis on the back that were treated with cryotherapy.  Call skin lesions do not improve/resolve or recur.  13. Abnormal thyroid  function test Added a free T4. Talk to your endocrinologist about your abnormal thyroid  lab.  14. Need for vaccination Get a flu vaccine today. - Flu, High-Dose, Trivalent, PF IM    Follow-up in 6 months or sooner if needed.  Call for any questions/concerns.  Return in 1 year (on 08/04/2025) for Medicare Annual Wellness Visit .  Electronically signed by: Shawn P  Lazoff, DO 08/03/2024 1:23 PM  This document was created using the aid of voice recognition Dragon dictation software.    [facility] Medicare Wellness Visit Type:: Subsequent Annual Wellness Visit  Name: Steve Bailey Date of Birth: 1934/07/04 Age: 89 y.o. MRN: [id number] Visit Date: 08/03/2024  History obtained from: patient  Living Arrangements/Support System/Health Assessment/Pain/Stress Marital status: widowed Number of children: 1 Occupation: retired Living arrangements: (!) lives alone Does the patient have a support system (family, friend, church, Conservation officer, nature, Catering manager)?: Yes Patient rates overall health status as: (Patient-Rptd) (P) good Do you have any dental concerns?: No In the past month, have you experienced a change in your bladder control?: No   Do you have any difficulty obtaining your medications?: No   Do you have trouble consistently taking or remembering to take all of your medications as prescribed?: No Patient rates overall stress level as: None Does stress affect daily life?: (!) Yes Typical amount of pain: none Does pain affect daily life?: No Are you currently prescribed opioids?: No                Depression Screening  Behavioral Health Screening  Patient Health Questionnaire-2 Score: 0 (08/03/2024  1:06 PM)      Patient's Depression screening/score = Negative    Depression Plan: Normal/Negative Screening    Social History (Tobacco/Drugs/Sexual Activity) Taurean reports that he quit smoking about 47 years ago. His smoking use included cigarettes. He has never been exposed to tobacco smoke. He quit smokeless tobacco use about 12 years ago. Tobacco Use?: No How many times in the past year have you used a recreational drug or used a prescription medication for nonmedical reasons?: None Risk factors for sexually transmitted infections (i.e., multiple sexual partners): No Are you  bothered by sexual problems?: No  Alcohol Screening How often do you have a drink containing alcohol?: Never How many standard drinks containing alcohol do you have on a typical day?: Never, 1 or 2 drinks How often do you have six or more drinks on one occasion?: Never Audit-C Score: 0  Physical Activity Regular exercise?: Yes Exercise frequency (times per week): 2 Exercise intensity: light (like slow walking)  Diet How many meals a day?: 3 Eats fruit and vegetables daily?: Yes Most meals are obtained by: preparing their own meals  Home and Transportation Safety All rugs have non-skid backing?: Yes All stairs or steps have railings?: Yes Grab bars in the bathtub or shower?: Yes Have non-skid surface in bathtub or shower?: (!) No Good home lighting?: Yes Regular seat belt use?: Yes      Activities of Daily Living Feed self?: Yes Bathe self?: Yes Dress self?: Yes Use toilet without assistance?: Yes Walk without assistance?: Yes    Instrumental Activities of Daily Living Manage finances?: Yes Shop for themselves?: Yes Prepare meals?: Yes Use the telephone?: Yes Manage medications?: Yes   Performs basic housework/laundry?: Yes Drives?: No Primary transportation is: (Patient-Rptd) (P) driving, family or friends  Hearing Concerns about hearing?: (!) Yes Uses hearing aids?: (!) Yes Hear whispered voice? (Observed): (!) No  Vision Concerns about vision?: (!) Yes Vision exam performed?: Yes  Fall Risk Is the patient ambulatory?: Yes One or more falls in the last year:: No Feels unsteady when walking:: No  Cognitive Assessment Has a diagnosis of dementia or cognitive impairment?: No Are there any memory concerns by the patient, others, or providers?: No              Advance Directives Living will?: Yes Advance directive information provided to patient: Yes Healthcare POA?: (!) No       Who is your in case of emergency contact?: Dickey Jenkins Goldberg Relationship to patient: Adult child Emergency contact's phone number: [phone]   Other History I reviewed and updated the following risk factors and conditions as appropriate: Reviewed/Updated: Problem List, Medical History, Surgical History, Family History, Medications, Allergies Reviewed/Updated: Vital Signs (height, weight, and BP), Immunizations, Health Maintenance Patient Care Team Updated: Done  Vital Signs BP 140/64   Pulse 60   Temp 97.8 F (36.6 C)   Ht 1.778 m (5' 10)   Wt 90.9 kg (200 lb 6.4 oz)   SpO2 92%   BMI 28.75 kg/m   Screening and Immunizations Health Maintenance Status       Date Due Completion Dates   DTaP/Tdap/Td Vaccines (1 - Tdap) Never done ---   ZOSTER VACCINE (1 of 2) Never done ---   Adult RSV (60+ Years or Pregnancy) (1 - 1-dose 75+ series) Never done ---   COVID-19 Vaccine (1 - 2024-25 season) Never done ---   Influenza Vaccine (1) 06/23/2024 07/29/2022, 09/05/2021   Depression Screening 03/22/2025 03/22/2024   Diabetes: Foot Exam 05/31/2025 05/31/2024   Diabetes: Hemoglobin A1C 08/01/2025 08/01/2024, 08/01/2024   Comprehensive Annual Visit 08/03/2025 08/03/2024, 08/03/2023   Medicare Annual Wellness (AWV) Subsequent Visits 08/03/2025 08/03/2024, 08/03/2023       Immunization History  Administered Date(s) Administered  . Influenza, High-dose Seasonal, Quadrivalent, Preservative Free 09/05/2021, 07/29/2022  . Influenza, Unspecified 08/17/2014, 08/30/2015, 08/10/2016, 08/17/2017, 08/24/2018, 08/24/2019, 09/19/2020  . Pneumococcal Conjugate 13-Valent 08/17/2017  . Pneumococcal Polysaccharide Vaccine, 23 Valent (PNEUMOVAX-23) 2Y+ 07/17/2019    Assessment/Plan: Subsequent Annual Wellness Visit: The topics above were reviewed with the patient.  Healthy lifestyle principles reviewed.  Recommendations provided when indicated.  Follow up 1 year for next wellness visit.  Orders Placed This Encounter  Procedures  . [facility] HISTORICAL DIABETES FOOT  EXAM    No orders of the defined types were placed in this encounter.   Patient Care Team: Shawn P Lazoff, DO as PCP - General (Family Medicine) Shawn P Lazoff, DO as PCP - Attributed  Electronically signed by: Shawn P Lazoff, DO 08/03/2024 1:21 PM       [1] Past Medical History: Diagnosis Date  .  Arthritis 1985  . Cancer    (CMD) 2014  . Crushing injury of right hand 12/25/2015   Last Assessment & Plan:  Patient had his hand crushed while working on a car yesterday. It is quite swollen and bruised. He can move all his fingers. He refused to go to the emergency room. I offered to order an x-ray and refer him to an orthopedic doctor but he really fuses. He says he may go to the urgent care and have it checked out. I urged him to do that today.  . Diabetes mellitus    (CMD)    Med Hx: Diabetes mellitus;   . Diabetic retinopathy    (CMD)   . HL (hearing loss) 2010  . Hypertension   . Visual impairment 2000  [2] Past Surgical History: Procedure Laterality Date  . CARDIAC SURGERY  1997  . CARDIAC VALVE REPLACEMENT  1997  . CATARACT EXTRACTION     Procedure: CATARACT EXTRACTION  . CORONARY ARTERY BYPASS GRAFT     Procedure: CORONARY ARTERY BYPASS GRAFT  . DESCEMETS STRIPPING AUTOMATED ENDOTHELIAL KERATOPLASTY Right 12/16/2017   Procedure: DESCEMETS MEMBRANE ENDOTHELIAL KERATOPLASTY;  Surgeon: Donnice Sickles, MD;  Location: [facility]IN OR;  Service: Ophthalmology;  Laterality: Right;  . EYE SURGERY  2018  . KNEE SURGERY     Procedure: KNEE SURGERY  [3] Allergies Allergen Reactions  . Cephalexin  GI Intolerance  [4] Family History Problem Relation Name Age of Onset  . Heart disease Mother Zelda   . Heart disease Father Particia   . Stroke Father [place]   . Prostate cancer Brother [place]   . Diabetes Brother [place]   . Hearing loss Brother [place]   . Heart disease Brother [place]   . Early death Sister Gabriella        Brain bleed  . Hearing loss Brother Donnie   . Heart disease  Brother Donnie   . Hearing loss Sister Alice   . Heart disease Sister Alice   . Hypertension Sister Sharyne   . Heart disease Brother [place]   . Heart disease Brother [place]   . Heart disease Sister [place]   . Heart disease Sister [place]   . Heart disease Sister Nichole   . Hypertension Sister Nichole   . Kidney disease Sister Nichole   . Stroke Brother [place]   . Anesthesia problems Neg Hx

## 2024-08-08 ENCOUNTER — Ambulatory Visit: Admitting: Endocrinology

## 2024-08-08 ENCOUNTER — Encounter: Payer: Self-pay | Admitting: Endocrinology

## 2024-08-08 VITALS — BP 132/82 | HR 62 | Resp 20 | Ht 72.0 in | Wt 203.8 lb

## 2024-08-08 DIAGNOSIS — E113593 Type 2 diabetes mellitus with proliferative diabetic retinopathy without macular edema, bilateral: Secondary | ICD-10-CM

## 2024-08-08 DIAGNOSIS — E1169 Type 2 diabetes mellitus with other specified complication: Secondary | ICD-10-CM

## 2024-08-08 DIAGNOSIS — Z794 Long term (current) use of insulin: Secondary | ICD-10-CM

## 2024-08-08 DIAGNOSIS — R7989 Other specified abnormal findings of blood chemistry: Secondary | ICD-10-CM

## 2024-08-08 MED ORDER — INSULIN LISPRO PROT & LISPRO (75-25 MIX) 100 UNIT/ML KWIKPEN: PEN_INJECTOR | SUBCUTANEOUS | 4 refills | Status: AC

## 2024-08-08 NOTE — Progress Notes (Signed)
 Outpatient Endocrinology Note [place] Pershing Skidmore, MD  08/08/24  Patient's Name: Steve Bailey    DOB: [birth year]    MRN: [id number]                                                    REASON OF VISIT: Follow up of type 2 diabetes mellitus  PCP: [facility]  HISTORY OF PRESENT ILLNESS:        AMROM ORE is a 89 y.o. old male with past medical history listed below, is here for follow up for type 2 diabetes mellitus.   Pertinent Diabetes History: Patient was diagnosed with type 2 diabetes mellitus in 1995.  He is on insulin  therapy and has controlled type 2 diabetes mellitus.  Chronic Diabetes Complications : Retinopathy: yes, proliferative diabetic retinopathy bilateral eyes. Following with ophthalmology regularly, [facility].  He has corneal dystrophy and macular edema as well as retinopathy. Nephropathy: no, on ACE/ARB /lisinopril . Peripheral neuropathy: yes, numbness / .  Coronary artery disease: yes, s/p CABG in 1997.  Stroke: no  Relevant comorbidities and cardiovascular risk factors: Obesity: no Body mass index is 27.64 kg/m.  Hypertension: Yes  Hyperlipidemia : Yes, on statin   Current / Home Diabetic regimen includes: Humalog  mix 75/25 KwikPen 15 units with breakfast- 6 - 10 units with supper.  Prior diabetic medications: Metformin  extended release was stopped due to abdominal discomfort.  Glycemic data:    CONTINUOUS GLUCOSE MONITORING SYSTEM (CGMS) INTERPRETATION: At today's visit, we reviewed CGM downloads. The full report is scanned in the media. Reviewing the CGM trends, blood glucose are as follows:  FreeStyle Libre 3 CGM-  Sensor Download (Sensor download was reviewed and summarized below.) Dates: September 3 to August 08, 2024, 14 days. Sensor Average: 136  Glucose Management Indicator: 6.6%  % data captured: 97%    Previous:    Interpretation: Mostly acceptable blood sugar.  He has trending down low normal  and rarely blood sugar in 60s in the early morning.  Blood sugar during the afternoon and in the evening are acceptable.  No concerning hypoglycemia.  Percentages of hypoglycemia has decreased compared to last visit.  Hypoglycemia: Patient has minor hypoglycemic episodes. Patient has hypoglycemia awareness.  Factors modifying glucose control: 1.  Diabetic diet assessment: 3 meals a day.  2.  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  3.  Medication compliance: compliant all of the time.  # Patient and daughter has question about if insulin  pump would be appropriate for him.  Discussed about how pumps works, it still require involvement of the patient actively including insulin  bolusing for meals.  At this time he has been requiring help from the daughter to change sensor site.  Discussed that probably the current regimen of insulin  twice a day would be the best option for him.  Patient and daughter agreed not to consider for insulin  pump therapy at this time.  As discussed in visit in March 2025.  # History of hypothyroidism : Patient used to take levothyroxine  25 mcg daily based on chart review was stopped in 2019 and was started on 2013.  Patient remained euthyroid without thyroid  medication in the past.  Interval history  Patient had lab on August 01, 2024 at [facility], reviewed at [facility] hemoglobin A1c was 6.3%.  CGM data as reviewed above.  He has rare mild hypoglycemia in the early morning otherwise mostly acceptable blood sugar.  Diabetes regimen as reviewed above.  He has been taking insulin  15 units in the morning however he takes 6 to 10 units of insulin  with supper based on blood sugar level and meal size.  On the lab on September 9 at outside facility at [facility] he had TSH of 5.577 with upper normal limit of 5.330.  He has no new symptoms.  He is accompanied by daughter in the clinic today.  REVIEW OF SYSTEMS As per history of present illness.    PAST MEDICAL HISTORY: Past Medical History:  Diagnosis Date   AAA (abdominal aortic aneurysm) (HCC) followed by dr laurence   last duplex 06/28/2018 4.6 cm,  asymptomatic   Anticoagulant long-term use    eliquis    Benign localized prostatic hyperplasia with lower urinary tract symptoms (LUTS)    CAD (coronary artery disease)    s/p CABG in 1997, patent grafts by cath in 2005, low-risk NST in 2012 and 12/2020   DDD (degenerative disc disease), cervical    Full dentures    History of radiation therapy 11/24/12-01/18/13   Prostate 78Gy/40fx   HOH (hard of hearing)    both   Hyperlipidemia    Hyperlipidemia    Hypertension    Permanent atrial fibrillation (HCC) 09/2012   Phimosis    severe   Proliferative diabetic retinopathy (HCC)    both eyes   Prostate cancer [facility]) urologist-- dr alvaro   dx 09-02-2012-- Stage T2a, Gleason 3+4=7, PSA 8.12, vol 103 cc--- completed external beam radiation 02/ 2014;  recurrent 2015 , started hormone therapy   S/P CABG x 3 1997   Strains to urinate    Type 2 diabetes mellitus treated with insulin  [facility])    endocrinologist-- dr von    PAST SURGICAL HISTORY: Past Surgical History:  Procedure Laterality Date   CARDIAC CATHETERIZATION  01-18-2004   dr tisa   patent grafts, ef 55%, minimal anterior hypokinesis,  severe total occlusion of LCFx and LAD,  severe disease of D2 and segmental RCA   CARDIOVASCULAR STRESS TEST  01/07/2011   normal nuclear study w/ no ischemia/  normal LV function and wall motion,  ef 60%   CARPAL TUNNEL RELEASE Right    CATARACT EXTRACTION W/ INTRAOCULAR LENS  IMPLANT, BILATERAL  left 2006;  right 2010   CIRCUMCISION N/A 07/28/2018   Procedure: CIRCUMCISION ADULT;  Surgeon: [name], MD;  Location: [facility];  Service: Urology;  Laterality: N/A;   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   CORONARY ANGIOPLASTY  1990   LAD   CORONARY ARTERY BYPASS GRAFT  11-1995   shows distal LAD disease, without recurrent  symptoms of angina with questionable mild apical ischemia but with normal EF    ESOPHAGOGASTRODUODENOSCOPY (EGD) WITH ESOPHAGEAL DILATION  05/2012   KNEE ARTHROSCOPY Left    TRANSTHORACIC ECHOCARDIOGRAM  10/10/2012   ef 55%, akinesis of the inferolateral wall,  grade 2 diastolic dysfunction/  trivial AR/  mild MR and TR/  mild LAE/  mild RVSF reduced and mild dilated RV    ALLERGIES: Allergies  Allergen Reactions   Cephalexin  Nausea And Vomiting   Tramadol  Palpitations    FAMILY HISTORY:  Family History  Problem Relation Age of Onset   Stroke Father    Heart attack Mother    Heart disease Mother    Cancer Sister  lung   Cancer Brother        prostate   Diabetes Brother    Stroke Brother    Stroke Brother    Heart disease Brother    Heart disease Brother    Heart attack Sister    Heart disease Sister    Heart attack Sister    Cancer Sister        breast   Heart disease Sister    Heart attack Sister    Heart disease Sister    Heart disease Sister     SOCIAL HISTORY: Social History   Socioeconomic History   Marital status: Widowed    Spouse name: Not on file   Number of children: 1   Years of education: Not on file   Highest education level: Not on file  Occupational History   Not on file  Tobacco Use   Smoking status: Former    Current packs/day: 0.00    Types: Cigarettes    Start date: 10/14/1959    Quit date: 10/14/1979    Years since quitting: 44.8   Smokeless tobacco: Former    Types: Chew    Quit date: 11/23/2012  Vaping Use   Vaping status: Never Used  Substance and Sexual Activity   Alcohol use: No   Drug use: No   Sexual activity: Not on file  Other Topics Concern   Not on file  Social History Narrative   Not on file   Social Drivers of Health   Financial Resource Strain: Not on file  Food Insecurity: Unknown (08/01/2024)   Received from [facility]   Hunger Vital Sign    Within the past 12 months, you worried that your food would  run out before you got money to buy more: Patient declined to answer    Within the past 12 months, the food you bought just didn't last and you didn't have money to get more. : Patient declined to answer  Transportation Needs: Not on file (08/01/2024)  Physical Activity: Not on file  Stress: Not on file  Social Connections: Not on file    MEDICATIONS:  Current Outpatient Medications  Medication Sig Dispense Refill   acetaminophen  (TYLENOL ) 500 MG tablet Take 1,300 mg by mouth at bedtime.     alfuzosin (UROXATRAL) 10 MG 24 hr tablet Take 10 mg by mouth at bedtime. Reported on 12/18/2015     amLODipine  (NORVASC ) 10 MG tablet TAKE 1 TABLET DAILY 90 tablet 3   atorvastatin  (LIPITOR) 40 MG tablet TAKE 1 TABLET DAILY 90 tablet 3   Blood Glucose Monitoring Suppl (FREESTYLE LITE) w/Device KIT 1 Device by Does not apply route 2 (two) times daily. 1 kit 0   Cinnamon 500 MG capsule Take 500 mg by mouth 2 (two) times daily.     Continuous Glucose Sensor (FREESTYLE LIBRE 3 SENSOR) MISC 1 each by Does not apply route every 14 (fourteen) days. 6 each 1   ELIQUIS  5 MG TABS tablet TAKE 1 TABLET TWICE A DAY 180 tablet 3   ezetimibe  (ZETIA ) 10 MG tablet TAKE 1 TABLET DAILY 90 tablet 3   ferrous sulfate 325 (65 FE) MG tablet Take 1 tablet by mouth daily.     finasteride (PROSCAR) 5 MG tablet Take 5 mg by mouth every morning. Reported on 12/18/2015     Glucagon  (GVOKE HYPOPEN  1-PACK) 1 MG/0.2ML SOAJ Inject 1 mg into the skin as needed (low blood sugar with impaired consciousness). 0.4 mL 2   glucose blood (  FREESTYLE LITE) test strip USE TO TEST TWICE A DAY 100 strip 2   Insulin  Pen Needle (PEN NEEDLES) 31G X 5 MM MISC 1 each by Does not apply route 2 (two) times daily. 180 each 2   lisinopril  (ZESTRIL ) 20 MG tablet TAKE 1 TABLET DAILY 90 tablet 3   metFORMIN  (GLUCOPHAGE -XR) 750 MG 24 hr tablet Take 1 tablet (750 mg total) by mouth 2 (two) times daily with a meal. 180 tablet 3   nitroGLYCERIN  (NITROSTAT ) 0.4 MG SL  tablet Place 1 tablet (0.4 mg total) under the tongue every 5 (five) minutes as needed for chest pain. 25 tablet 3   Omega-3 Fatty Acids (OMEGA-3 FISH OIL PO) Take 1 capsule by mouth daily.     pantoprazole  (PROTONIX ) 40 MG tablet TAKE 1 TABLET DAILY 90 tablet 3   prednisoLONE acetate (PRED FORTE) 1 % ophthalmic suspension PLACE 1 DROP INTO THE RIGHT EYE DAILY.     Insulin  Lispro Prot & Lispro (HUMALOG  MIX 75/25 KWIKPEN) (75-25) 100 UNIT/ML Kwikpen 15 units with breakfast and 6-8 units with supper. 30 mL 4   No current facility-administered medications for this visit.    PHYSICAL EXAM: Vitals:   08/08/24 1540  BP: 132/82  Pulse: 62  Resp: 20  SpO2: 99%  Weight: 203 lb 12.8 oz (92.4 kg)  Height: 6' (1.829 m)    Body mass index is 27.64 kg/m.  Wt Readings from Last 3 Encounters:  08/08/24 203 lb 12.8 oz (92.4 kg)  07/19/24 198 lb (89.8 kg)  07/18/24 197 lb 4.8 oz (89.5 kg)    General: Well developed, well nourished male in no apparent distress.  HEENT: AT/[place], no external lesions. Hearing impairment.  Eyes: Conjunctiva clear and no icterus. Neck: Neck supple  Lungs: Respirations not labored Neurologic: Alert, oriented, normal speech Extremities / Skin: Dry.   Psychiatric: Does not appear depressed or anxious  Diabetic Foot Exam - Simple   No data filed    LABS Reviewed Lab Results  Component Value Date   HGBA1C 6.3 (A) 05/01/2024   HGBA1C 6.0 (A) 02/01/2024   HGBA1C 5.6 09/20/2023   Lab Results  Component Value Date   FRUCTOSAMINE 251 04/27/2019   Lab Results  Component Value Date   CHOL 150 01/07/2023   HDL 51.80 01/07/2023   LDLCALC 78 01/07/2023   TRIG 101.0 01/07/2023   CHOLHDL 3 01/07/2023   Lab Results  Component Value Date   MICRALBCREAT 70 (H) 05/01/2024   Lab Results  Component Value Date   CREATININE 1.10 05/01/2024   Lab Results  Component Value Date   GFR 50.61 (L) 01/07/2023    ASSESSMENT / PLAN  1. Type 2 diabetes mellitus with  other specified complication, with long-term current use of insulin  (HCC)   2. Proliferative diabetic retinopathy of both eyes associated with type 2 diabetes mellitus, unspecified proliferative retinopathy type (HCC)   3. Elevated TSH     Diabetes Mellitus type 2, complicated by diabetic retinopathy and neuropathy. - Diabetic status / severity: Controlled with rare hypoglycemia.  Lab Results  Component Value Date   HGBA1C 6.3 (A) 05/01/2024    - Hemoglobin A1c goal : <7.5%  It is okay to have permissive hyperglycemia with hemoglobin A1c close to 7.5%, to avoid hypoglycemia.  Hypoglycemia has decreased after gradually decreasing dose of insulin .   Medications: See below.  Discussed about taking less insulin  for the supper to avoid early morning or overnight hypoglycemia.  Discussed that it is okay to have  blood sugar in the range of 100 - 150 overnight and early morning.  Diabetes regimen: Humalog  mix 75/25 insulin  continue 15 units with breakfast and decrease 6- 10 units to 6 -8 units with supper.  Advised to take less Humalog  in the morning when there is anticipated increased physical activity or skipping meal in the afternoon.  - Home glucose testing: CGM Freestyle libre 3 and check as needed.  - Discussed/ Gave Hypoglycemia treatment plan.  Always carry glucose and take glucose tablets to correct hypoglycemia may take 2 to 4 tablets at a time.  Glucagon  Emergency Kit prescribed.  Discussed about optimal glucose around 100-150 range in the morning fasting and in between the meals and up to 200 range after meals.  # Consult : Refer to podiatry.  # Annual urine for microalbuminuria/ creatinine ratio, + microalbuminuria currently.  Currently on lisinopril .  Will continue to monitor.   Last  Lab Results  Component Value Date   MICRALBCREAT 70 (H) 05/01/2024    # Foot check nightly / neuropathy.  Will refer to podiatry.  # Patient had diabetic retinopathy, regularly  following with ophthalmology at [facility].  2. Blood pressure  -  BP Readings from Last 1 Encounters:  08/08/24 132/82    - Control is in target.  - No change in current plans.    3. Lipid status / Hyperlipidemia - Last  Lab Results  Component Value Date   LDLCALC 78 01/07/2023   - Continue atorvastatin  40 mg daily.  Managed by PCP/cardiology.  # Elevated TSH -He had mildly elevated TSH recently.  It is appropriate physiologically based on his age.  No thyroid  hormone replacement is required.  Diagnoses and all orders for this visit:  Type 2 diabetes mellitus with other specified complication, with long-term current use of insulin  (HCC) -     Ambulatory referral to Podiatry -     Insulin  Lispro Prot & Lispro (HUMALOG  MIX 75/25 KWIKPEN) (75-25) 100 UNIT/ML Kwikpen; 15 units with breakfast and 6-8 units with supper.  Proliferative diabetic retinopathy of both eyes associated with type 2 diabetes mellitus, unspecified proliferative retinopathy type (HCC)  Elevated TSH    DISPOSITION Follow up in clinic in 3 months suggested.   All questions answered and patient verbalized understanding of the plan.  [place] Rubena Roseman, MD [facility] [address] [place], [address] [place], [place] [address] Phone # [phone]  At least part of this note was generated using voice recognition software. Inadvertent word errors may have occurred, which were not recognized during the proofreading process.

## 2024-08-09 ENCOUNTER — Ambulatory Visit (HOSPITAL_COMMUNITY)
Admission: RE | Admit: 2024-08-09 | Discharge: 2024-08-09 | Disposition: A | Discharge status: Home / Self Care | Source: Ambulatory Visit | Attending: Internal Medicine | Admitting: Internal Medicine

## 2024-08-09 DIAGNOSIS — M7989 Other specified soft tissue disorders: Secondary | ICD-10-CM | POA: Insufficient documentation

## 2024-08-09 DIAGNOSIS — Z01810 Encounter for preprocedural cardiovascular examination: Secondary | ICD-10-CM | POA: Diagnosis not present

## 2024-08-09 DIAGNOSIS — I2510 Atherosclerotic heart disease of native coronary artery without angina pectoris: Secondary | ICD-10-CM | POA: Diagnosis not present

## 2024-08-09 DIAGNOSIS — I4821 Permanent atrial fibrillation: Secondary | ICD-10-CM | POA: Insufficient documentation

## 2024-08-09 LAB — ECHOCARDIOGRAM COMPLETE
Area-P 1/2: 4.89 cm2
S' Lateral: 3.1 cm

## 2024-08-09 NOTE — Progress Notes (Signed)
*  PRELIMINARY RESULTS* Echocardiogram 2D Echocardiogram has been performed.  Teresa Aida PARAS 08/09/2024, 10:16 AM

## 2024-08-15 ENCOUNTER — Other Ambulatory Visit: Payer: Self-pay

## 2024-08-16 NOTE — Progress Notes (Unsigned)
 Cardiology Office Note   Date:  08/17/2024  ID:  Steve Bailey, DOB Nov 27, 1933, MRN [id number] PCP: Bailey, Steve P, DO  [facility] Providers Cardiologist:  None   History of Present Illness Steve Bailey is a 89 y.o. male with a past medical history of CAD, A-fib, HTN orthostatic hypotension here for follow-up appointment.  Was last seen by our office 07/19/2024 and endorsed some dizziness from sitting to standing.  Orthostatics were positive at that visit.  Denied any angina or DOE.  No syncope, fatigue, or palpitations.  Did endorse some leg swelling.  At that visit hydration was strongly encouraged and he was asymptomatic from an atrial fibrillation standpoint.  He was being seen for preop clearance and recommendations for Eliquis  hold 2 to 3 days prior to EVAR was made.  Echocardiogram and Lexi scan were both ordered for preop evaluation.  Testing as listed below.  Today, he presents with a hx of coronary artery disease and atrial fibrillation who presents for pre-operative clearance for abdominal aortic aneurysm repair. He was referred by Steve Bailey for evaluation and clearance for vascular surgery.  He experiences shortness of breath with exertion, particularly when walking uphill, ongoing for a couple of years. A recent stress test indicated a small area of ischemia. An echocardiogram showed strong heart pump function with some valve leaks and atrial dilation.  He has atrial fibrillation but does not feel his irregular heartbeat. He experiences dizziness, particularly when looking up or standing quickly, which has led to falls. He takes meclizine as needed for dizziness, which he finds helpful.  He has a history of coronary artery disease, having had stents placed and open-heart surgery in the past. He manages his diabetes with insulin  and uses a continuous glucose monitor, having reduced his insulin  dosage since using the monitor.  Reports no chest pain, pressure, or  tightness. No edema, orthopnea, PND. Reports no palpitations.   Discussed the use of AI scribe software for clinical note transcription with the patient, who gave verbal consent to proceed.  ROS: pertinent ROS in HPI  Studies Reviewed  Stress test 9//25  Stress test findings are equivocal. Small area of ischemia noted. Overall, patient is at low to moderate risk for EVAR procedure.  EKG Interpretation Date/Time:  Thursday August 17 2024 14:04:52 EDT Ventricular Rate:  75 PR Interval:    QRS Duration:  80 QT Interval:  418 QTC Calculation: 466 R Axis:   -8  Text Interpretation: Atrial fibrillation Minimal voltage criteria for LVH, may be normal variant ( R in aVL ) Possible Inferior infarct , age undetermined When compared with ECG of 19-Jul-2024 16:02, Borderline criteria for Inferior infarct are now Present Confirmed by Steve Bailey [phone]) on 08/17/2024 5:14:00 PM    Echocardiogram 08/09/2024 IMPRESSIONS     1. Left ventricular ejection fraction, by estimation, is 55 to 60%. The  left ventricle has normal function. The left ventricle has no regional  wall motion abnormalities. There is moderate left ventricular hypertrophy  of the basal-septal segment. Left  ventricular diastolic function could not be evaluated.   2. Right ventricular systolic function is normal. The right ventricular  size is normal. There is moderately elevated pulmonary artery systolic  pressure. The estimated right ventricular systolic pressure is 53.4 mmHg.   3. Left atrial size was severely dilated.   4. Right atrial size was moderately dilated.   5. The mitral valve is normal in structure. Mild mitral valve  regurgitation. No evidence of mitral  stenosis.   6. Tricuspid valve regurgitation is mild to moderate.   7. The aortic valve is tricuspid. There is moderate calcification of the  aortic valve. There is moderate thickening of the aortic valve. Aortic  valve regurgitation is mild. Aortic valve  sclerosis/calcification is  present, without any evidence of aortic  stenosis.   8. Aortic dilatation noted. There is mild dilatation of the aortic root,  measuring 41 mm.   9. The inferior vena cava is dilated in size with >50% respiratory  variability, suggesting right atrial pressure of 8 mmHg.       Physical Exam VS:  BP (!) 155/81   Pulse 75   Ht 6' (1.829 m)   Wt 200 lb (90.7 kg)   SpO2 97%   BMI 27.12 kg/m        Wt Readings from Last 3 Encounters:  08/17/24 200 lb (90.7 kg)  08/08/24 203 lb 12.8 oz (92.4 kg)  07/19/24 198 lb (89.8 kg)    GEN: Well nourished, well developed in no acute distress NECK: No JVD; No carotid bruits CARDIAC: RRR, no murmurs, rubs, gallops RESPIRATORY:  Clear to auscultation without rales, wheezing or rhonchi  ABDOMEN: Soft, non-tender, non-distended EXTREMITIES:  No edema; No deformity   ASSESSMENT AND PLAN  Preop clearance Steve Bailey's perioperative risk of a major cardiac event is 11% according to the Revised Cardiac Risk Index (RCRI).  Therefore, he is at high risk for perioperative complications.   His functional capacity is fair at 4.4 METs according to the Duke Activity Status Index (DASI). Recommendations: According to ACC/AHA guidelines, no further cardiovascular testing needed.  The patient may proceed to surgery at acceptable risk.   Stress test and echocardiogram reviewed with the patient today  Antiplatelet and/or Anticoagulation Recommendations: - Hold Eliquis  for 2 to 3 days prior to EVAR and resume when safe from bleeding standpoint.   Abdominal aortic aneurysm, 5.3 x 5.5 cm Aneurysm requires urgent endovascular repair due to rupture risk. Surgery less risky than rupture. - Proceed with endovascular repair on October 8th. - Contact Steve Bailey office for earlier surgery date if possible.   Permanent atrial fibrillation, rate controlled AFib with controlled rate at 75 bpm. Echocardiogram showed atrial dilation, common in  AFib.  Chronic diastolic heart failure Heart failure with preserved ejection fraction (55-60%). Echocardiogram consistent with history, showing mild valve leaks and atrial dilation.  Edema of lower extremities Edema of unclear etiology. - Encourage use of compression stockings. - Elevate legs to reduce edema.  Orthostatic hypotension with recurrent dizziness and falls Dizziness and falls likely due to orthostatic hypotension, possibly vertigo. Meclizine effective, suggesting non-cardiac cause. Dehydration may contribute. - Encourage hydration. - Consider compression stockings. - Monitor blood pressure to balance symptoms.  Type 2 diabetes mellitus, insulin  treated Diabetes managed with insulin . Recent switch from metformin  to insulin  improved control with continuous glucose monitoring. - Continue current insulin  regimen and glucose monitoring.  Gastroesophageal reflux disease Intermittent abdominal pain and burning managed with Protonix . - Continue Protonix  for acid reflux management.      Dispo: He can return in 6 months with [place], PA-C  Signed, Orren LOISE Fabry, PA-C

## 2024-08-17 ENCOUNTER — Ambulatory Visit (INDEPENDENT_AMBULATORY_CARE_PROVIDER_SITE_OTHER): Admitting: Podiatry

## 2024-08-17 ENCOUNTER — Ambulatory Visit: Attending: Physician Assistant | Admitting: Physician Assistant

## 2024-08-17 ENCOUNTER — Encounter: Payer: Self-pay | Admitting: Physician Assistant

## 2024-08-17 ENCOUNTER — Encounter: Payer: Self-pay | Admitting: Podiatry

## 2024-08-17 VITALS — BP 155/81 | HR 75 | Ht 72.0 in | Wt 200.0 lb

## 2024-08-17 DIAGNOSIS — E785 Hyperlipidemia, unspecified: Secondary | ICD-10-CM | POA: Diagnosis present

## 2024-08-17 DIAGNOSIS — M79671 Pain in right foot: Secondary | ICD-10-CM | POA: Diagnosis not present

## 2024-08-17 DIAGNOSIS — I2510 Atherosclerotic heart disease of native coronary artery without angina pectoris: Secondary | ICD-10-CM | POA: Diagnosis not present

## 2024-08-17 DIAGNOSIS — Z01810 Encounter for preprocedural cardiovascular examination: Secondary | ICD-10-CM | POA: Diagnosis not present

## 2024-08-17 DIAGNOSIS — M79672 Pain in left foot: Secondary | ICD-10-CM

## 2024-08-17 DIAGNOSIS — B351 Tinea unguium: Secondary | ICD-10-CM | POA: Diagnosis not present

## 2024-08-17 DIAGNOSIS — I951 Orthostatic hypotension: Secondary | ICD-10-CM | POA: Diagnosis not present

## 2024-08-17 DIAGNOSIS — I4821 Permanent atrial fibrillation: Secondary | ICD-10-CM | POA: Insufficient documentation

## 2024-08-17 DIAGNOSIS — M7672 Peroneal tendinitis, left leg: Secondary | ICD-10-CM

## 2024-08-17 NOTE — Progress Notes (Signed)
 Patient presents for evaluation and treatment of tenderness and some redness around nails feet.  Has some pain at times on the posterior aspect of the ankle on the left.  Says little bit sore right now.  Some complaint of pain around the first metatarsal phalangeal joint bilaterally.  Tenderness around toes with walking and wearing shoes.  Has a burning in his feet at night  Physical exam:  General appearance: Alert, pleasant, and in no acute distress.  Vascular: Pedal pulses: DP 2 to/4 B/L, PT 1/4 B/L.  Moderate edema lower legs bilaterally.  Capillary refill time immediate bilateral   Neurologic: Diminished vibratory sensation in feet bilaterally.  Light touch intact bilaterally.  Monofilament sensation intact bilaterally.  Decreased Achilles tendon reflex bilaterally.  Dermatologic:  Nails thickened, disfigured, discolored 1-5 BL with subungual debris.  Redness and hypertrophic nail folds along nail folds bilaterally but no signs of drainage or infection.  Musculoskeletal:  Soreness along the peroneal tendons retromalleolar area left.  Normal muscle strength lower extremity bilaterally.  Hammertoes 2 through 5 bilaterally.  Hallux valgus bilaterally.   Diagnosis: 1. Painful onychomycotic nails 1 through 5 bilaterally. 2. Pain toes 1 through 5 bilaterally. 3.  Peroneal tendinitis left  Plan: -New patient office visit for evaluation and management level 3.  Modifier 25. - Discussed with him the peroneal tendinitis recommend using topical Voltaren on it and icing the area.  Wear good stable shoe.  Some of this could be some neuropathic pain also. -Debrided onychomycotic nails 1 through 5 bilaterally.  Sharply debrided nails with nail clipper and reduced with a power bur.  -Rx for diabetic shoes and custom insoles bilaterally, dispense 1 pair each  Return 3 months [facility]

## 2024-08-17 NOTE — Patient Instructions (Signed)
 Medication Instructions:  Your physician recommends that you continue on your current medications as directed. Please refer to the Current Medication list given to you today.  *If you need a refill on your cardiac medications before your next appointment, please call your pharmacy*   Follow-Up: At [facility], you and your health needs are our priority.  As part of our continuing mission to provide you with exceptional heart care, our providers are all part of one team.  This team includes your primary Cardiologist (physician) and Advanced Practice Providers or APPs (Physician Assistants and Nurse Practitioners) who all work together to provide you with the care you need, when you need it.  Your next appointment:   6 month(s)  Provider:   Laymon Qua, PA-C    We recommend signing up for the patient portal called MyChart.  Sign up information is provided on this After Visit Summary.  MyChart is used to connect with patients for Virtual Visits (Telemedicine).  Patients are able to view lab/test results, encounter notes, upcoming appointments, etc.  Non-urgent messages can be sent to your provider as well.   To learn more about what you can do with MyChart, go to [email or web address].   Other Instructions Please monitor your blood pressure

## 2024-08-18 NOTE — Telephone Encounter (Signed)
 CORRECT FAX# [phone].

## 2024-08-18 NOTE — Telephone Encounter (Signed)
 I will fax notes from Orren Fabry, [facility] to requesting office.

## 2024-08-24 ENCOUNTER — Other Ambulatory Visit: Payer: Self-pay

## 2024-08-24 ENCOUNTER — Other Ambulatory Visit: Payer: Self-pay | Admitting: Lab

## 2024-08-24 DIAGNOSIS — M7672 Peroneal tendinitis, left leg: Secondary | ICD-10-CM

## 2024-08-24 DIAGNOSIS — I714 Abdominal aortic aneurysm, without rupture, unspecified: Secondary | ICD-10-CM

## 2024-08-24 DIAGNOSIS — I7143 Infrarenal abdominal aortic aneurysm, without rupture: Secondary | ICD-10-CM

## 2024-08-24 DIAGNOSIS — M79671 Pain in right foot: Secondary | ICD-10-CM

## 2024-08-24 DIAGNOSIS — T801XXA Vascular complications following infusion, transfusion and therapeutic injection, initial encounter: Secondary | ICD-10-CM

## 2024-08-24 DIAGNOSIS — B351 Tinea unguium: Secondary | ICD-10-CM

## 2024-08-24 NOTE — Progress Notes (Signed)
 dme

## 2024-08-24 NOTE — Progress Notes (Signed)
 Pre operative orders entered.

## 2024-08-24 NOTE — Progress Notes (Signed)
 Surgical Instructions   Your procedure is scheduled on Wednesday, October 8th, 2025. Report to [facility] Main Entrance A at 6:30 A.M., then check in with the Admitting office. Any questions or running late day of surgery: call [phone]  Questions prior to your surgery date: call [phone], Monday-Friday, 8am-4pm. If you experience any cold or flu symptoms such as cough, fever, chills, shortness of breath, etc. between now and your scheduled surgery, please notify us  at the above number.     Remember:  Do not eat or drink after midnight the night before your surgery    Take these medicines the morning of surgery with A SIP OF WATER: Amlodipine  (Norvasc ) Atorvastatin  (Lipitor) Ezetimibe  (Zetia ) Finasteride (Proscar) Pantoprazole  (Protonix ) Prednisolone Acetate Eye drop Sodium Chloride  (Muro 128) eye drop   May take these medicines IF NEEDED: Acetaminophen  (Tylenol ) Nitroglycerin  (Nitrostat ) - please call [phone] after taking this medication   Per your surgeon's instructions, Eliquis  should be stopped 3 days prior to your surgery.  Your last dose of Eliquis  should be on Saturday, October 4th.    One week prior to surgery, STOP taking any Aspirin (unless otherwise instructed by your surgeon) Aleve, Naproxen, Ibuprofen, Motrin, Advil, Goody's, BC's, all herbal medications, fish oil, and non-prescription vitamins.   WHAT DO I DO ABOUT MY DIABETES MEDICATION?   THE NIGHT BEFORE SURGERY, take _4__ units of _Insulin Lispro Prot & Lispro (Humalog  Mix 75/25)__insulin.       THE MORNING OF SURGERY, do NOT take Insulin  Lispro Prot & Lispro (Humalog  Mix 75/25).     HOW TO MANAGE YOUR DIABETES BEFORE AND AFTER SURGERY  Why is it important to control my blood sugar before and after surgery? Improving blood sugar levels before and after surgery helps healing and can limit problems. A way of improving blood sugar control is eating a healthy diet by:  Eating less sugar  and carbohydrates  Increasing activity/exercise  Talking with your doctor about reaching your blood sugar goals High blood sugars (greater than 180 mg/dL) can raise your risk of infections and slow your recovery, so you will need to focus on controlling your diabetes during the weeks before surgery. Make sure that the doctor who takes care of your diabetes knows about your planned surgery including the date and location.  How do I manage my blood sugar before surgery? Check your blood sugar at least 4 times a day, starting 2 days before surgery, to make sure that the level is not too high or low.  Check your blood sugar the morning of your surgery when you wake up and every 2 hours until you get to the Short Stay unit.  If your blood sugar is less than 70 mg/dL, you will need to treat for low blood sugar: Do not take insulin . Treat a low blood sugar (less than 70 mg/dL) with  cup of clear juice (cranberry or apple), 4 glucose tablets, OR glucose gel. Recheck blood sugar in 15 minutes after treatment (to make sure it is greater than 70 mg/dL). If your blood sugar is not greater than 70 mg/dL on recheck, call [phone] for further instructions. Report your blood sugar to the short stay nurse when you get to Short Stay.  If you are admitted to the hospital after surgery: Your blood sugar will be checked by the staff and you will probably be given insulin  after surgery (instead of oral diabetes medicines) to make sure you have good blood sugar levels. The goal for blood sugar control after  surgery is 80-180 mg/dL.                      Do NOT Smoke (Tobacco/Vaping) for 24 hours prior to your procedure.  If you use a CPAP at night, you may bring your mask/headgear for your overnight stay.   You will be asked to remove any contacts, glasses, piercing's, hearing aid's, dentures/partials prior to surgery. Please bring cases for these items if needed.    Patients discharged the day of surgery  will not be allowed to drive home, and someone needs to stay with them for 24 hours.  SURGICAL WAITING ROOM VISITATION Patients may have no more than 2 support people in the waiting area - these visitors may rotate.   Pre-op nurse will coordinate an appropriate time for 1 ADULT support person, who may not rotate, to accompany patient in pre-op.  Children under the age of 7 must have an adult with them who is not the patient and must remain in the main waiting area with an adult.  If the patient needs to stay at the hospital during part of their recovery, the visitor guidelines for inpatient rooms apply.  Please refer to the [facility] website for the visitor guidelines for any additional information.   If you received a COVID test during your pre-op visit  it is requested that you wear a mask when out in public, stay away from anyone that may not be feeling well and notify your surgeon if you develop symptoms. If you have been in contact with anyone that has tested positive in the last 10 days please notify you surgeon.      Pre-operative CHG Bathing Instructions   You can play a key role in reducing the risk of infection after surgery. Your skin needs to be as free of germs as possible. You can reduce the number of germs on your skin by washing with CHG (chlorhexidine gluconate) soap before surgery. CHG is an antiseptic soap that kills germs and continues to kill germs even after washing.   DO NOT use if you have an allergy to chlorhexidine/CHG or antibacterial soaps. If your skin becomes reddened or irritated, stop using the CHG and notify one of our RNs at [phone].              TAKE A SHOWER THE NIGHT BEFORE SURGERY AND THE DAY OF SURGERY    Please keep in mind the following:  DO NOT shave, including legs and underarms, 48 hours prior to surgery.   You may shave your face before/day of surgery.  Place clean sheets on your bed the night before surgery Use a clean washcloth (not  used since being washed) for each shower. DO NOT sleep with pet's night before surgery.  CHG Shower Instructions:  Wash your face and private area with normal soap. If you choose to wash your hair, wash first with your normal shampoo.  After you use shampoo/soap, rinse your hair and body thoroughly to remove shampoo/soap residue.  Turn the water OFF and apply half the bottle of CHG soap to a CLEAN washcloth.  Apply CHG soap ONLY FROM YOUR NECK DOWN TO YOUR TOES (washing for 3-5 minutes)  DO NOT use CHG soap on face, private areas, open wounds, or sores.  Pay special attention to the area where your surgery is being performed.  If you are having back surgery, having someone wash your back for you may be helpful. Wait 2 minutes after  CHG soap is applied, then you may rinse off the CHG soap.  Pat dry with a clean towel  Put on clean pajamas    Additional instructions for the day of surgery: DO NOT APPLY any lotions, deodorants, cologne, or perfumes.   Do not wear jewelry or makeup Do not wear nail polish, gel polish, artificial nails, or any other type of covering on natural nails (fingers and toes) Do not bring valuables to the hospital. [facility] is not responsible for valuables/personal belongings. Put on clean/comfortable clothes.  Please brush your teeth.  Ask your nurse before applying any prescription medications to the skin.

## 2024-08-25 ENCOUNTER — Encounter (HOSPITAL_COMMUNITY)
Admission: RE | Admit: 2024-08-25 | Discharge: 2024-08-25 | Disposition: A | Discharge status: Home / Self Care | Source: Ambulatory Visit | Attending: Vascular Surgery | Admitting: Vascular Surgery

## 2024-08-25 ENCOUNTER — Other Ambulatory Visit: Payer: Self-pay

## 2024-08-25 ENCOUNTER — Encounter (HOSPITAL_COMMUNITY): Payer: Self-pay

## 2024-08-25 VITALS — BP 143/72 | HR 70 | Temp 98.4°F | Resp 18 | Ht 72.0 in | Wt 198.0 lb

## 2024-08-25 DIAGNOSIS — Z01818 Encounter for other preprocedural examination: Secondary | ICD-10-CM

## 2024-08-25 DIAGNOSIS — Z01812 Encounter for preprocedural laboratory examination: Secondary | ICD-10-CM | POA: Diagnosis present

## 2024-08-25 DIAGNOSIS — Z7901 Long term (current) use of anticoagulants: Secondary | ICD-10-CM | POA: Insufficient documentation

## 2024-08-25 DIAGNOSIS — K219 Gastro-esophageal reflux disease without esophagitis: Secondary | ICD-10-CM | POA: Diagnosis not present

## 2024-08-25 DIAGNOSIS — Z794 Long term (current) use of insulin: Secondary | ICD-10-CM | POA: Insufficient documentation

## 2024-08-25 DIAGNOSIS — Z8546 Personal history of malignant neoplasm of prostate: Secondary | ICD-10-CM | POA: Insufficient documentation

## 2024-08-25 DIAGNOSIS — Y849 Medical procedure, unspecified as the cause of abnormal reaction of the patient, or of later complication, without mention of misadventure at the time of the procedure: Secondary | ICD-10-CM | POA: Insufficient documentation

## 2024-08-25 DIAGNOSIS — I4821 Permanent atrial fibrillation: Secondary | ICD-10-CM | POA: Insufficient documentation

## 2024-08-25 DIAGNOSIS — I083 Combined rheumatic disorders of mitral, aortic and tricuspid valves: Secondary | ICD-10-CM | POA: Diagnosis not present

## 2024-08-25 DIAGNOSIS — Z951 Presence of aortocoronary bypass graft: Secondary | ICD-10-CM | POA: Diagnosis not present

## 2024-08-25 DIAGNOSIS — I2510 Atherosclerotic heart disease of native coronary artery without angina pectoris: Secondary | ICD-10-CM | POA: Insufficient documentation

## 2024-08-25 DIAGNOSIS — I714 Abdominal aortic aneurysm, without rupture, unspecified: Secondary | ICD-10-CM | POA: Insufficient documentation

## 2024-08-25 DIAGNOSIS — E119 Type 2 diabetes mellitus without complications: Secondary | ICD-10-CM | POA: Diagnosis not present

## 2024-08-25 DIAGNOSIS — Z87891 Personal history of nicotine dependence: Secondary | ICD-10-CM | POA: Insufficient documentation

## 2024-08-25 DIAGNOSIS — Z79899 Other long term (current) drug therapy: Secondary | ICD-10-CM | POA: Diagnosis not present

## 2024-08-25 DIAGNOSIS — T801XXA Vascular complications following infusion, transfusion and therapeutic injection, initial encounter: Secondary | ICD-10-CM | POA: Insufficient documentation

## 2024-08-25 DIAGNOSIS — I7143 Infrarenal abdominal aortic aneurysm, without rupture: Secondary | ICD-10-CM | POA: Diagnosis not present

## 2024-08-25 LAB — URINALYSIS, ROUTINE W REFLEX MICROSCOPIC
Bilirubin Urine: NEGATIVE
Glucose, UA: NEGATIVE mg/dL
Hgb urine dipstick: NEGATIVE
Ketones, ur: NEGATIVE mg/dL
Leukocytes,Ua: NEGATIVE
Nitrite: NEGATIVE
Protein, ur: NEGATIVE mg/dL
Specific Gravity, Urine: 1.021 (ref 1.005–1.030)
pH: 5 (ref 5.0–8.0)

## 2024-08-25 LAB — COMPREHENSIVE METABOLIC PANEL WITH GFR
ALT: 21 U/L (ref 0–44)
AST: 23 U/L (ref 15–41)
Albumin: 3.8 g/dL (ref 3.5–5.0)
Alkaline Phosphatase: 80 U/L (ref 38–126)
Anion gap: 10 (ref 5–15)
BUN: 20 mg/dL (ref 8–23)
CO2: 26 mmol/L (ref 22–32)
Calcium: 9 mg/dL (ref 8.9–10.3)
Chloride: 105 mmol/L (ref 98–111)
Creatinine, Ser: 1.37 mg/dL — ABNORMAL HIGH (ref 0.61–1.24)
GFR, Estimated: 50 mL/min — ABNORMAL LOW (ref >60)
Glucose, Bld: 165 mg/dL — ABNORMAL HIGH (ref 70–99)
Potassium: 4.1 mmol/L (ref 3.5–5.1)
Sodium: 141 mmol/L (ref 135–145)
Total Bilirubin: 1.1 mg/dL (ref 0.0–1.2)
Total Protein: 6.7 g/dL (ref 6.5–8.1)

## 2024-08-25 LAB — CBC
HCT: 34.2 % — ABNORMAL LOW (ref 39.0–52.0)
Hemoglobin: 11.6 g/dL — ABNORMAL LOW (ref 13.0–17.0)
MCH: 34 pg (ref 26.0–34.0)
MCHC: 33.9 g/dL (ref 30.0–36.0)
MCV: 100.3 fL — ABNORMAL HIGH (ref 80.0–100.0)
Platelets: 137 K/uL — ABNORMAL LOW (ref 150–400)
RBC: 3.41 MIL/uL — ABNORMAL LOW (ref 4.22–5.81)
RDW: 13.2 % (ref 11.5–15.5)
WBC: 4.9 K/uL (ref 4.0–10.5)
nRBC: 0 % (ref 0.0–0.2)

## 2024-08-25 LAB — TYPE AND SCREEN
ABO/RH(D): O POS
Antibody Screen: NEGATIVE

## 2024-08-25 LAB — SURGICAL PCR SCREEN
MRSA, PCR: NEGATIVE
Staphylococcus aureus: POSITIVE — AB

## 2024-08-25 LAB — APTT: aPTT: 32 s (ref 24–36)

## 2024-08-25 LAB — GLUCOSE, CAPILLARY: Glucose-Capillary: 167 mg/dL — ABNORMAL HIGH (ref 70–99)

## 2024-08-25 NOTE — Progress Notes (Signed)
 PCP - Lazoff, Elouise SQUIBB, DO  Cardiologist - Lucien Orren SAILOR, PA-C   PPM/ICD - denies Device Orders - n/a Rep Notified - n/a  Chest x-ray - denies EKG - 08-17-24 Stress Test - 07-26-24 ECHO - 08-09-24 Cardiac Cath - 2005  Sleep Study - denies CPAP - n/a  Fasting Blood Sugar - Per patient and daughter blood sugar ranges 150. Blood sugar at PAT 150. Checks Blood Sugar patient has a Libre3 left arm Last A1c was 6.3 on 08-01-24  Last dose of GLP1 agonist-  denies GLP1 instructions: n/a  Blood Thinner Instructions:ELIQUIS  last dose 08-26-24 Aspirin Instructions:denies  ERAS Protcol -NPO   COVID TEST- n/a   Anesthesia review: yes hx of HTN, CAD, DM.  Patient denies shortness of breath, fever, cough and chest pain at PAT appointment   All instructions explained to the patient, with a verbal understanding of the material. Patient agrees to go over the instructions while at home for a better understanding. Patient also instructed to self quarantine after being tested for COVID-19. The opportunity to ask questions was provided.

## 2024-08-28 NOTE — Progress Notes (Signed)
 Anesthesia Chart Review:  89 year old male follows cardiology for history of CAD s/p CABG 1997, permanent atrial fibrillation on Eliquis , orthostatic hypotension (also on meclizine for potential noncardiac cause of dizziness).  Echo 07/2024 showed LVEF 55 to 60%, normal wall motion, normal RV systolic function, moderately elevated RVSP 53.4 mmHg, mild mitral regurgitation, mild aortic insufficiency, mild dilation of aortic root 41 mm.  Nuclear stress 07/26/2024 low risk.  Seen by Orren Fabry, PA-C 08/17/2024 for preop evaluation.  Per note, Preop clearance Mr. Barba's perioperative risk of a major cardiac event is 11% according to the Revised Cardiac Risk Index (RCRI).  Therefore, he is at high risk for perioperative complications.   His functional capacity is fair at 4.4 METs according to the Duke Activity Status Index (DASI). Recommendations: According to ACC/AHA guidelines, no further cardiovascular testing needed.  The patient may proceed to surgery at acceptable risk.  Stress test and echocardiogram reviewed with the patient todayWas good because everything sort of here is just Putting it together  Antiplatelet and/or Anticoagulation Recommendations: - Hold Eliquis  for 2 to 3 days prior to EVAR and resume when safe from bleeding standpoint.   Patient reports last dose of Eliquis  08/26/2024.  Follows with vascular surgery for history of AAA.  CT abdomen pelvis 06/19/2024 with 5.5 cm AAA.  EVAR recommended.  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  Preop labs reviewed, creatinine mildly elevated 1.37, mild anemia hemoglobin 11.6.  EKG 08/17/2024: Atrial fibrillation.  Rate 75. Minimal voltage criteria for LVH, may be normal variant. Possible Inferior infarct , age undetermined  TTE 08/09/2024: 1. Left ventricular ejection fraction, by  estimation, is 55 to 60%. The  left ventricle has normal function. The left ventricle has no regional  wall motion abnormalities. There is moderate left ventricular hypertrophy  of the basal-septal segment. Left  ventricular diastolic function could not be evaluated.   2. Right ventricular systolic function is normal. The right ventricular  size is normal. There is moderately elevated pulmonary artery systolic  pressure. The estimated right ventricular systolic pressure is 53.4 mmHg.   3. Left atrial size was severely dilated.   4. Right atrial size was moderately dilated.   5. The mitral valve is normal in structure. Mild mitral valve  regurgitation. No evidence of mitral stenosis.   6. Tricuspid valve regurgitation is mild to moderate.   7. The aortic valve is tricuspid. There is moderate calcification of the  aortic valve. There is moderate thickening of the aortic valve. Aortic  valve regurgitation is mild. Aortic valve sclerosis/calcification is  present, without any evidence of aortic  stenosis.   8. Aortic dilatation noted. There is mild dilatation of the aortic root,  measuring 41 mm.   9. The inferior vena cava is dilated in size with >50% respiratory  variability, suggesting right atrial pressure of 8 mmHg.   Nuclear stress 07/26/2024:    Findings are equivocal. The study is low risk.   No ST deviation was noted. The ECG was negative for ischemia.   LV perfusion is equivocal.  Small, mild intensity, variable basal inferolateral defect reflecting either diaphragmatic attenuation versus a very small region of ischemia.   Left ventricular function is normal. Nuclear stress EF: 64%.     Lynwood Geofm RIGGERS [facility] Short Stay Center/Anesthesiology Phone [phone] 08/28/2024 12:09 PM

## 2024-08-28 NOTE — Anesthesia Preprocedure Evaluation (Signed)
 Anesthesia Evaluation  Patient identified by MRN, date of birth, ID band Patient awake    Reviewed: Allergy & Precautions, H&P , NPO status , Patient's Chart, lab work & pertinent test results  Airway Mallampati: II   Neck ROM: full    Dental   Pulmonary former smoker   breath sounds clear to auscultation       Cardiovascular hypertension, + CAD, + CABG and + Peripheral Vascular Disease  + dysrhythmias Atrial Fibrillation  Rhythm:regular Rate:Normal  AAA   Neuro/Psych    GI/Hepatic   Endo/Other  diabetes, Type 2Hypothyroidism    Renal/GU    Prostate CA    Musculoskeletal   Abdominal   Peds  Hematology   Anesthesia Other Findings   Reproductive/Obstetrics                              Anesthesia Physical Anesthesia Plan  ASA: 3  Anesthesia Plan: General   Post-op Pain Management:    Induction: Intravenous  PONV Risk Score and Plan: 2 and Ondansetron , Dexamethasone  and Treatment may vary due to age or medical condition  Airway Management Planned: Oral ETT  Additional Equipment: Arterial line  Intra-op Plan:   Post-operative Plan: Extubation in OR  Informed Consent: I have reviewed the patients History and Physical, chart, labs and discussed the procedure including the risks, benefits and alternatives for the proposed anesthesia with the patient or authorized representative who has indicated his/her understanding and acceptance.     Dental advisory given  Plan Discussed with: CRNA, Anesthesiologist and Surgeon  Anesthesia Plan Comments: (PAT note by Lynwood Hope, PA-C:  89 year old male follows cardiology for history of CAD s/p CABG 1997, permanent atrial fibrillation on Eliquis , orthostatic hypotension (also on meclizine for potential noncardiac cause of dizziness).  Echo 07/2024 showed LVEF 55 to 60%, normal wall motion, normal RV systolic function, moderately elevated RVSP  53.4 mmHg, mild mitral regurgitation, mild aortic insufficiency, mild dilation of aortic root 41 mm.  Nuclear stress 07/26/2024 low risk.  Seen by Orren Fabry, PA-C 08/17/2024 for preop evaluation.  Per note, Preop clearance Mr. Adams's perioperative risk of a major cardiac event is 11% according to the Revised Cardiac Risk Index (RCRI).  Therefore, he is at high risk for perioperative complications.   His functional capacity is fair at 4.4 METs according to the Duke Activity Status Index (DASI). Recommendations: According to ACC/AHA guidelines, no further cardiovascular testing needed.  The patient may proceed to surgery at acceptable risk.  Stress test and echocardiogram reviewed with the patient todayWas good because everything sort of here is just Putting it together  Antiplatelet and/or Anticoagulation Recommendations: - Hold Eliquis  for 2 to 3 days prior to EVAR and resume when safe from bleeding standpoint.  Patient reports last dose of Eliquis  08/26/2024.  Follows with vascular surgery for history of AAA.  CT abdomen pelvis 06/19/2024 with 5.5 cm AAA.  EVAR recommended.  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  Preop labs reviewed, creatinine mildly elevated 1.37, mild anemia hemoglobin 11.6.  EKG 08/17/2024: Atrial fibrillation.  Rate 75. Minimal voltage criteria for LVH, may be normal variant. Possible Inferior infarct , age undetermined  TTE 08/09/2024: 1. Left ventricular ejection fraction, by estimation, is 55 to 60%. The  left ventricle has normal function. The left ventricle has  no regional  wall motion abnormalities. There is moderate left ventricular hypertrophy  of the basal-septal segment. Left  ventricular diastolic function could not be evaluated.  2. Right ventricular systolic function is normal. The right ventricular   size is normal. There is moderately elevated pulmonary artery systolic  pressure. The estimated right ventricular systolic pressure is 53.4 mmHg.  3. Left atrial size was severely dilated.  4. Right atrial size was moderately dilated.  5. The mitral valve is normal in structure. Mild mitral valve  regurgitation. No evidence of mitral stenosis.  6. Tricuspid valve regurgitation is mild to moderate.  7. The aortic valve is tricuspid. There is moderate calcification of the  aortic valve. There is moderate thickening of the aortic valve. Aortic  valve regurgitation is mild. Aortic valve sclerosis/calcification is  present, without any evidence of aortic  stenosis.  8. Aortic dilatation noted. There is mild dilatation of the aortic root,  measuring 41 mm.  9. The inferior vena cava is dilated in size with >50% respiratory  variability, suggesting right atrial pressure of 8 mmHg.   Nuclear stress 07/26/2024:    Findings are equivocal. The study is low risk.   No ST deviation was noted. The ECG was negative for ischemia.   LV perfusion is equivocal.  Small, mild intensity, variable basal inferolateral defect reflecting either diaphragmatic attenuation versus a very small region of ischemia.   Left ventricular function is normal. Nuclear stress EF: 64%.  )         Anesthesia Quick Evaluation

## 2024-08-30 ENCOUNTER — Telehealth: Payer: Self-pay | Admitting: Endocrinology

## 2024-08-30 ENCOUNTER — Inpatient Hospital Stay (HOSPITAL_COMMUNITY): Payer: Self-pay | Admitting: Physician Assistant

## 2024-08-30 ENCOUNTER — Inpatient Hospital Stay (HOSPITAL_COMMUNITY)

## 2024-08-30 ENCOUNTER — Other Ambulatory Visit: Payer: Self-pay

## 2024-08-30 ENCOUNTER — Encounter (HOSPITAL_COMMUNITY)
Admission: RE | Disposition: A | Discharge status: Home Health | Payer: Self-pay | Source: Home / Self Care | Attending: Vascular Surgery

## 2024-08-30 ENCOUNTER — Inpatient Hospital Stay (HOSPITAL_COMMUNITY)
Admission: RE | Admit: 2024-08-30 | Discharge: 2024-08-31 | DRG: 269 | Disposition: A | Discharge status: Home Health | Attending: Vascular Surgery | Admitting: Vascular Surgery

## 2024-08-30 ENCOUNTER — Encounter (HOSPITAL_COMMUNITY): Payer: Self-pay | Admitting: Vascular Surgery

## 2024-08-30 DIAGNOSIS — Z885 Allergy status to narcotic agent status: Secondary | ICD-10-CM | POA: Diagnosis not present

## 2024-08-30 DIAGNOSIS — Z8546 Personal history of malignant neoplasm of prostate: Secondary | ICD-10-CM

## 2024-08-30 DIAGNOSIS — I10 Essential (primary) hypertension: Secondary | ICD-10-CM | POA: Diagnosis present

## 2024-08-30 DIAGNOSIS — Z87891 Personal history of nicotine dependence: Secondary | ICD-10-CM | POA: Diagnosis not present

## 2024-08-30 DIAGNOSIS — E113593 Type 2 diabetes mellitus with proliferative diabetic retinopathy without macular edema, bilateral: Secondary | ICD-10-CM | POA: Diagnosis present

## 2024-08-30 DIAGNOSIS — H9193 Unspecified hearing loss, bilateral: Secondary | ICD-10-CM | POA: Diagnosis present

## 2024-08-30 DIAGNOSIS — E1169 Type 2 diabetes mellitus with other specified complication: Secondary | ICD-10-CM | POA: Diagnosis present

## 2024-08-30 DIAGNOSIS — E785 Hyperlipidemia, unspecified: Secondary | ICD-10-CM | POA: Diagnosis present

## 2024-08-30 DIAGNOSIS — Z9861 Coronary angioplasty status: Secondary | ICD-10-CM

## 2024-08-30 DIAGNOSIS — Z833 Family history of diabetes mellitus: Secondary | ICD-10-CM | POA: Diagnosis not present

## 2024-08-30 DIAGNOSIS — Z79899 Other long term (current) drug therapy: Secondary | ICD-10-CM

## 2024-08-30 DIAGNOSIS — Z947 Corneal transplant status: Secondary | ICD-10-CM

## 2024-08-30 DIAGNOSIS — I7143 Infrarenal abdominal aortic aneurysm, without rupture: Principal | ICD-10-CM | POA: Diagnosis present

## 2024-08-30 DIAGNOSIS — I4821 Permanent atrial fibrillation: Secondary | ICD-10-CM | POA: Diagnosis present

## 2024-08-30 DIAGNOSIS — Z9841 Cataract extraction status, right eye: Secondary | ICD-10-CM

## 2024-08-30 DIAGNOSIS — Z923 Personal history of irradiation: Secondary | ICD-10-CM | POA: Diagnosis not present

## 2024-08-30 DIAGNOSIS — Z951 Presence of aortocoronary bypass graft: Secondary | ICD-10-CM | POA: Diagnosis not present

## 2024-08-30 DIAGNOSIS — Z8249 Family history of ischemic heart disease and other diseases of the circulatory system: Secondary | ICD-10-CM

## 2024-08-30 DIAGNOSIS — Z881 Allergy status to other antibiotic agents status: Secondary | ICD-10-CM

## 2024-08-30 DIAGNOSIS — Z8679 Personal history of other diseases of the circulatory system: Principal | ICD-10-CM

## 2024-08-30 DIAGNOSIS — Z7901 Long term (current) use of anticoagulants: Secondary | ICD-10-CM | POA: Diagnosis not present

## 2024-08-30 DIAGNOSIS — I2510 Atherosclerotic heart disease of native coronary artery without angina pectoris: Secondary | ICD-10-CM | POA: Diagnosis present

## 2024-08-30 DIAGNOSIS — Z794 Long term (current) use of insulin: Secondary | ICD-10-CM

## 2024-08-30 DIAGNOSIS — Z7984 Long term (current) use of oral hypoglycemic drugs: Secondary | ICD-10-CM | POA: Diagnosis not present

## 2024-08-30 DIAGNOSIS — Z961 Presence of intraocular lens: Secondary | ICD-10-CM | POA: Diagnosis present

## 2024-08-30 DIAGNOSIS — Z9842 Cataract extraction status, left eye: Secondary | ICD-10-CM | POA: Diagnosis not present

## 2024-08-30 HISTORY — PX: ABDOMINAL AORTIC ENDOVASCULAR STENT GRAFT: SHX5707

## 2024-08-30 LAB — BASIC METABOLIC PANEL WITH GFR
Anion gap: 8 (ref 5–15)
BUN: 20 mg/dL (ref 8–23)
CO2: 21 mmol/L — ABNORMAL LOW (ref 22–32)
Calcium: 8.4 mg/dL — ABNORMAL LOW (ref 8.9–10.3)
Chloride: 107 mmol/L (ref 98–111)
Creatinine, Ser: 1.28 mg/dL — ABNORMAL HIGH (ref 0.61–1.24)
GFR, Estimated: 54 mL/min — ABNORMAL LOW (ref >60)
Glucose, Bld: 177 mg/dL — ABNORMAL HIGH (ref 70–99)
Potassium: 3.5 mmol/L (ref 3.5–5.1)
Sodium: 136 mmol/L (ref 135–145)

## 2024-08-30 LAB — GLUCOSE, CAPILLARY
Glucose-Capillary: 157 mg/dL — ABNORMAL HIGH (ref 70–99)
Glucose-Capillary: 167 mg/dL — ABNORMAL HIGH (ref 70–99)
Glucose-Capillary: 209 mg/dL — ABNORMAL HIGH (ref 70–99)
Glucose-Capillary: 313 mg/dL — ABNORMAL HIGH (ref 70–99)
Glucose-Capillary: 277 mg/dL — ABNORMAL HIGH (ref 70–99)

## 2024-08-30 LAB — CBC
HCT: 29.1 % — ABNORMAL LOW (ref 39.0–52.0)
Hemoglobin: 9.8 g/dL — ABNORMAL LOW (ref 13.0–17.0)
MCH: 33.8 pg (ref 26.0–34.0)
MCHC: 33.7 g/dL (ref 30.0–36.0)
MCV: 100.3 fL — ABNORMAL HIGH (ref 80.0–100.0)
Platelets: 89 K/uL — ABNORMAL LOW (ref 150–400)
RBC: 2.9 MIL/uL — ABNORMAL LOW (ref 4.22–5.81)
RDW: 13.1 % (ref 11.5–15.5)
WBC: 4.9 K/uL (ref 4.0–10.5)
nRBC: 0 % (ref 0.0–0.2)

## 2024-08-30 LAB — PROTIME-INR
INR: 1.2 (ref 0.8–1.2)
Prothrombin Time: 16.1 s — ABNORMAL HIGH (ref 11.4–15.2)

## 2024-08-30 LAB — APTT: aPTT: 38 s — ABNORMAL HIGH (ref 24–36)

## 2024-08-30 LAB — MAGNESIUM: Magnesium: 1.6 mg/dL — ABNORMAL LOW (ref 1.7–2.4)

## 2024-08-30 LAB — ABO/RH: ABO/RH(D): O POS

## 2024-08-30 SURGERY — INSERTION, ENDOVASCULAR STENT GRAFT, AORTA, ABDOMINAL
Anesthesia: General

## 2024-08-30 MED ORDER — CHLORHEXIDINE GLUCONATE 0.12 % MT SOLN
15.0000 mL | Freq: Once | OROMUCOSAL | Status: AC
  Administered 2024-08-30: 15 mL via OROMUCOSAL
  Filled 2024-08-30: qty 15

## 2024-08-30 MED ORDER — FENTANYL CITRATE (PF) 100 MCG/2ML IJ SOLN
INTRAMUSCULAR | Status: AC
  Filled 2024-08-30: qty 2

## 2024-08-30 MED ORDER — HEPARIN SODIUM (PORCINE) 1000 UNIT/ML IJ SOLN
INTRAMUSCULAR | Status: DC | PRN
  Administered 2024-08-30: 9000 [IU] via INTRAVENOUS
  Administered 2024-08-30: 3000 [IU] via INTRAVENOUS

## 2024-08-30 MED ORDER — FENTANYL CITRATE (PF) 250 MCG/5ML IJ SOLN
INTRAMUSCULAR | Status: DC | PRN
  Administered 2024-08-30: 100 ug via INTRAVENOUS

## 2024-08-30 MED ORDER — PROTAMINE SULFATE 10 MG/ML IV SOLN
INTRAVENOUS | Status: DC | PRN
  Administered 2024-08-30: 50 mg via INTRAVENOUS

## 2024-08-30 MED ORDER — DEXAMETHASONE SODIUM PHOSPHATE 10 MG/ML IJ SOLN
INTRAMUSCULAR | Status: DC | PRN
  Administered 2024-08-30: 10 mg via INTRAVENOUS

## 2024-08-30 MED ORDER — POLYETHYLENE GLYCOL 3350 17 G PO PACK: 17.0000 g | PACK | Freq: Every day | ORAL | Status: DC | PRN

## 2024-08-30 MED ORDER — SUGAMMADEX SODIUM 200 MG/2ML IV SOLN
INTRAVENOUS | Status: DC | PRN
  Administered 2024-08-30: 200 mg via INTRAVENOUS

## 2024-08-30 MED ORDER — PANTOPRAZOLE SODIUM 40 MG PO TBEC
40.0000 mg | DELAYED_RELEASE_TABLET | Freq: Every day | ORAL | Status: DC
  Administered 2024-08-31: 40 mg via ORAL
  Filled 2024-08-30: qty 1

## 2024-08-30 MED ORDER — PHENYLEPHRINE HCL-NACL 20-0.9 MG/250ML-% IV SOLN
INTRAVENOUS | Status: DC | PRN
  Administered 2024-08-30: 50 ug/min via INTRAVENOUS

## 2024-08-30 MED ORDER — PROPOFOL 10 MG/ML IV BOLUS
INTRAVENOUS | Status: AC
  Filled 2024-08-30: qty 20

## 2024-08-30 MED ORDER — ALFUZOSIN HCL ER 10 MG PO TB24
10.0000 mg | ORAL_TABLET | Freq: Every day | ORAL | Status: DC
Start: 2024-08-30 — End: 2024-08-31
  Filled 2024-08-30: qty 1

## 2024-08-30 MED ORDER — ACETAMINOPHEN 650 MG RE SUPP: 325.0000 mg | RECTAL | Status: DC | PRN

## 2024-08-30 MED ORDER — EZETIMIBE 10 MG PO TABS
10.0000 mg | ORAL_TABLET | Freq: Every day | ORAL | Status: DC
  Administered 2024-08-31: 10 mg via ORAL
  Filled 2024-08-30: qty 1

## 2024-08-30 MED ORDER — LIDOCAINE 2% (20 MG/ML) 5 ML SYRINGE
INTRAMUSCULAR | Status: AC
  Filled 2024-08-30: qty 5

## 2024-08-30 MED ORDER — ONDANSETRON HCL 4 MG/2ML IJ SOLN
INTRAMUSCULAR | Status: DC | PRN
Start: 2024-08-30 — End: 2024-08-30
  Administered 2024-08-30: 4 mg via INTRAVENOUS

## 2024-08-30 MED ORDER — DOCUSATE SODIUM 100 MG PO CAPS: 100.0000 mg | ORAL_CAPSULE | Freq: Every day | ORAL | Status: DC

## 2024-08-30 MED ORDER — PHENYLEPHRINE 80 MCG/ML (10ML) SYRINGE FOR IV PUSH (FOR BLOOD PRESSURE SUPPORT)
PREFILLED_SYRINGE | INTRAVENOUS | Status: AC
  Filled 2024-08-30: qty 10

## 2024-08-30 MED ORDER — DEXAMETHASONE SODIUM PHOSPHATE 10 MG/ML IJ SOLN
INTRAMUSCULAR | Status: AC
  Filled 2024-08-30: qty 1

## 2024-08-30 MED ORDER — SODIUM CHLORIDE 0.9 % IV SOLN: INTRAVENOUS | Status: DC

## 2024-08-30 MED ORDER — HEPARIN 6000 UNIT IRRIGATION SOLUTION
Status: DC | PRN
  Administered 2024-08-30: 1

## 2024-08-30 MED ORDER — ROCURONIUM BROMIDE 10 MG/ML (PF) SYRINGE
PREFILLED_SYRINGE | INTRAVENOUS | Status: AC
Start: 2024-08-30 — End: 2024-08-30
  Filled 2024-08-30: qty 10

## 2024-08-30 MED ORDER — INSULIN ASPART 100 UNIT/ML IJ SOLN: 0.0000 [IU] | INTRAMUSCULAR | Status: DC | PRN

## 2024-08-30 MED ORDER — 0.9 % SODIUM CHLORIDE (POUR BTL) OPTIME
TOPICAL | Status: DC | PRN
  Administered 2024-08-30: 1000 mL

## 2024-08-30 MED ORDER — LISINOPRIL 20 MG PO TABS: 20.0000 mg | ORAL_TABLET | Freq: Every day | ORAL | Status: DC

## 2024-08-30 MED ORDER — ACETAMINOPHEN 325 MG PO TABS
325.0000 mg | ORAL_TABLET | ORAL | Status: DC | PRN
  Administered 2024-08-30: 650 mg via ORAL
  Filled 2024-08-30: qty 2

## 2024-08-30 MED ORDER — PROTAMINE SULFATE 10 MG/ML IV SOLN
INTRAVENOUS | Status: AC
Start: 2024-08-30 — End: 2024-08-30
  Filled 2024-08-30: qty 5

## 2024-08-30 MED ORDER — OXYCODONE HCL 5 MG PO TABS: 5.0000 mg | ORAL_TABLET | Freq: Once | ORAL | Status: DC | PRN

## 2024-08-30 MED ORDER — LIDOCAINE 2% (20 MG/ML) 5 ML SYRINGE
INTRAMUSCULAR | Status: DC | PRN
  Administered 2024-08-30: 60 mg via INTRAVENOUS

## 2024-08-30 MED ORDER — LACTATED RINGERS IV SOLN
INTRAVENOUS | Status: DC
Start: 2024-08-30 — End: 2024-08-30

## 2024-08-30 MED ORDER — HEPARIN 6000 UNIT IRRIGATION SOLUTION
Status: AC
Start: 2024-08-30 — End: 2024-08-30
  Filled 2024-08-30: qty 500

## 2024-08-30 MED ORDER — HYDRALAZINE HCL 20 MG/ML IJ SOLN
10.0000 mg | Freq: Once | INTRAMUSCULAR | Status: DC
Start: 2024-08-30 — End: 2024-08-31

## 2024-08-30 MED ORDER — POTASSIUM CHLORIDE CRYS ER 20 MEQ PO TBCR: 40.0000 meq | EXTENDED_RELEASE_TABLET | Freq: Every day | ORAL | Status: DC | PRN

## 2024-08-30 MED ORDER — VANCOMYCIN HCL IN DEXTROSE 1-5 GM/200ML-% IV SOLN
1000.0000 mg | INTRAVENOUS | Status: AC
  Administered 2024-08-30: 1000 mg via INTRAVENOUS
  Filled 2024-08-30: qty 200

## 2024-08-30 MED ORDER — PROPOFOL 10 MG/ML IV BOLUS
INTRAVENOUS | Status: DC | PRN
  Administered 2024-08-30: 130 mg via INTRAVENOUS

## 2024-08-30 MED ORDER — CHLORHEXIDINE GLUCONATE CLOTH 2 % EX PADS: 6.0000 | MEDICATED_PAD | Freq: Once | CUTANEOUS | Status: DC

## 2024-08-30 MED ORDER — IODIXANOL 320 MG/ML IV SOLN
INTRAVENOUS | Status: DC | PRN
  Administered 2024-08-30: 85 mL via INTRA_ARTERIAL

## 2024-08-30 MED ORDER — ATORVASTATIN CALCIUM 40 MG PO TABS
40.0000 mg | ORAL_TABLET | Freq: Every day | ORAL | Status: DC
  Administered 2024-08-30 – 2024-08-31 (×2): 40 mg via ORAL
  Filled 2024-08-30 (×2): qty 1

## 2024-08-30 MED ORDER — ROCURONIUM BROMIDE 10 MG/ML (PF) SYRINGE
PREFILLED_SYRINGE | INTRAVENOUS | Status: DC | PRN
  Administered 2024-08-30: 50 mg via INTRAVENOUS

## 2024-08-30 MED ORDER — FINASTERIDE 5 MG PO TABS
5.0000 mg | ORAL_TABLET | Freq: Every morning | ORAL | Status: DC
  Administered 2024-08-31: 5 mg via ORAL
  Filled 2024-08-30: qty 1

## 2024-08-30 MED ORDER — ORAL CARE MOUTH RINSE
15.0000 mL | Freq: Once | OROMUCOSAL | Status: AC
Start: 2024-08-30 — End: 2024-08-30

## 2024-08-30 MED ORDER — MORPHINE SULFATE (PF) 2 MG/ML IV SOLN: 2.0000 mg | INTRAVENOUS | Status: DC | PRN

## 2024-08-30 MED ORDER — HEPARIN SODIUM (PORCINE) 1000 UNIT/ML IJ SOLN
INTRAMUSCULAR | Status: AC
  Filled 2024-08-30: qty 1

## 2024-08-30 MED ORDER — INSULIN ASPART 100 UNIT/ML IJ SOLN
0.0000 [IU] | Freq: Three times a day (TID) | INTRAMUSCULAR | Status: DC
  Administered 2024-08-30: 11 [IU] via SUBCUTANEOUS
  Administered 2024-08-31: 3 [IU] via SUBCUTANEOUS

## 2024-08-30 MED ORDER — OXYCODONE HCL 5 MG/5ML PO SOLN: 5.0000 mg | Freq: Once | ORAL | Status: DC | PRN

## 2024-08-30 MED ORDER — FENTANYL CITRATE (PF) 100 MCG/2ML IJ SOLN
25.0000 ug | INTRAMUSCULAR | Status: DC | PRN
  Administered 2024-08-30: 25 ug via INTRAVENOUS

## 2024-08-30 MED ORDER — ONDANSETRON HCL 4 MG/2ML IJ SOLN
INTRAMUSCULAR | Status: AC
  Filled 2024-08-30: qty 2

## 2024-08-30 MED ORDER — OXYCODONE-ACETAMINOPHEN 5-325 MG PO TABS: 1.0000 | ORAL_TABLET | ORAL | Status: DC | PRN

## 2024-08-30 MED ORDER — FENTANYL CITRATE (PF) 250 MCG/5ML IJ SOLN
INTRAMUSCULAR | Status: AC
  Filled 2024-08-30: qty 5

## 2024-08-30 MED ORDER — GLYCOPYRROLATE 0.2 MG/ML IJ SOLN
INTRAMUSCULAR | Status: DC | PRN
Start: 2024-08-30 — End: 2024-08-30
  Administered 2024-08-30: .2 mg via INTRAVENOUS

## 2024-08-30 MED ORDER — HYDRALAZINE HCL 20 MG/ML IJ SOLN
10.0000 mg | Freq: Once | INTRAMUSCULAR | Status: AC
  Administered 2024-08-30: 10 mg via INTRAVENOUS
  Filled 2024-08-30: qty 1

## 2024-08-30 MED ORDER — HEPARIN SODIUM (PORCINE) 5000 UNIT/ML IJ SOLN
5000.0000 [IU] | Freq: Two times a day (BID) | INTRAMUSCULAR | Status: DC
  Administered 2024-08-31: 5000 [IU] via SUBCUTANEOUS
  Filled 2024-08-30: qty 1

## 2024-08-30 MED ORDER — ONDANSETRON HCL 4 MG/2ML IJ SOLN: 4.0000 mg | Freq: Four times a day (QID) | INTRAMUSCULAR | Status: DC | PRN

## 2024-08-30 MED ORDER — AMLODIPINE BESYLATE 10 MG PO TABS
10.0000 mg | ORAL_TABLET | Freq: Every day | ORAL | Status: DC
  Administered 2024-08-30 – 2024-08-31 (×2): 10 mg via ORAL
  Filled 2024-08-30 (×2): qty 1

## 2024-08-30 MED ORDER — METOPROLOL TARTRATE 5 MG/5ML IV SOLN: 2.5000 mg | INTRAVENOUS | Status: DC | PRN

## 2024-08-30 MED ORDER — PHENOL 1.4 % MT LIQD: 1.0000 | OROMUCOSAL | Status: DC | PRN

## 2024-08-30 SURGICAL SUPPLY — 40 items
BAG COUNTER SPONGE SURGICOUNT (BAG) ×1 IMPLANT
CANISTER SUCTION 3000ML PPV (SUCTIONS) ×1 IMPLANT
CATH BEACON 5.038 65CM KMP-01 (CATHETERS) ×1 IMPLANT
CATH OMNI FLUSH .035X70CM (CATHETERS) ×1 IMPLANT
DERMABOND ADVANCED .7 DNX12 (GAUZE/BANDAGES/DRESSINGS) ×1 IMPLANT
DEVICE CLOSURE PERCLS PRGLD 6F (Vascular Products) ×4 IMPLANT
DEVICE TORQUE KENDALL .025-038 (MISCELLANEOUS) IMPLANT
DRSG TEGADERM 2-3/8X2-3/4 SM (GAUZE/BANDAGES/DRESSINGS) ×2 IMPLANT
ELECTRODE REM PT RTRN 9FT ADLT (ELECTROSURGICAL) ×2 IMPLANT
EXCLUDER TNK LEG 28MX14X16 (Endovascular Graft) IMPLANT
EXTENDER ENDOPROSTHESIS 14X7 (Endovascular Graft) IMPLANT
GAUZE SPONGE 2X2 8PLY STRL LF (GAUZE/BANDAGES/DRESSINGS) ×1 IMPLANT
GLOVE BIO SURGEON STRL SZ7.5 (GLOVE) ×1 IMPLANT
GLOVE BIOGEL PI IND STRL 7.0 (GLOVE) IMPLANT
GLOVE BIOGEL PI IND STRL 8 (GLOVE) ×1 IMPLANT
GOWN STRL REUS W/ TWL LRG LVL3 (GOWN DISPOSABLE) ×3 IMPLANT
GOWN STRL REUS W/ TWL XL LVL3 (GOWN DISPOSABLE) ×1 IMPLANT
GRAFT BALLN CATH 65CM (BALLOONS) ×1 IMPLANT
GUIDEWIRE ANGLED .035X150CM (WIRE) IMPLANT
KIT BASIN OR (CUSTOM PROCEDURE TRAY) ×1 IMPLANT
KIT TURNOVER KIT B (KITS) ×1 IMPLANT
LEG CONTRALATERAL 16X14.5X14 (Vascular Products) IMPLANT
PACK ENDOVASCULAR (PACKS) ×1 IMPLANT
PAD ARMBOARD POSITIONER FOAM (MISCELLANEOUS) ×2 IMPLANT
SET MICROPUNCTURE 5F STIFF (MISCELLANEOUS) ×1 IMPLANT
SHEATH BRITE TIP 8FR 23CM (SHEATH) ×1 IMPLANT
SHEATH DRYSEAL FLEX 12FR 33CM (SHEATH) IMPLANT
SHEATH DRYSEAL FLEX 18FR 33CM (SHEATH) IMPLANT
SHEATH PINNACLE 8F 10CM (SHEATH) ×1 IMPLANT
SOLN 0.9% NACL 1000 ML (IV SOLUTION) ×1 IMPLANT
SOLN 0.9% NACL POUR BTL 1000ML (IV SOLUTION) ×1 IMPLANT
STOPCOCK MORSE 400PSI 3WAY (MISCELLANEOUS) ×1 IMPLANT
SUT MNCRL AB 4-0 PS2 18 (SUTURE) ×1 IMPLANT
SYR 20ML LL LF (SYRINGE) ×1 IMPLANT
SYR MEDRAD MARK V 150ML (SYRINGE) IMPLANT
TOWEL GREEN STERILE (TOWEL DISPOSABLE) ×1 IMPLANT
TRAY FOLEY MTR SLVR 16FR STAT (SET/KITS/TRAYS/PACK) ×1 IMPLANT
TUBING HIGH PRESSURE 120CM (CONNECTOR) ×1 IMPLANT
WIRE AMPLATZ SS-J .035X180CM (WIRE) ×2 IMPLANT
WIRE BENTSON .035X145CM (WIRE) ×2 IMPLANT

## 2024-08-30 NOTE — H&P (Signed)
 History and Physical Interval Note:  08/30/2024 7:59 AM  Steve Bailey  has presented today for surgery, with the diagnosis of infrarenal AAA w/o rupture.  The various methods of treatment have been discussed with the patient and family. After consideration of risks, benefits and other options for treatment, the patient has consented to  Procedure(s): INSERTION, ENDOVASCULAR STENT GRAFT, AORTA, ABDOMINAL (N/A) as a surgical intervention.  The patient's history has been reviewed, patient examined, no change in status, stable for surgery.  I have reviewed the patient's chart and labs.  Questions were answered to the patient's satisfaction.    EVAR for AAA  Lonni JINNY Gaskins     Patient name: Steve Bailey           MRN: [id number]        DOB: [birth year]            Sex: male   REASON FOR VISIT: F/U after CTA for AAA   HPI: Steve Bailey is a 89 y.o. male with history of diabetes, hypertension, hyperlipidemia, prostate cancer, A. Fib on eliquis , known abdominal aortic aneurysm, coronary disease status post CABG that presents for follow-up after CTA for further evaluation of his AAA.     Last clinic visit duplex suggest continued growth of his aneurysm now 5.6 cm.  I sent him for CT.           Past Medical History:  Diagnosis Date   AAA (abdominal aortic aneurysm) (HCC) followed by dr laurence    last duplex 06/28/2018 4.6 cm,  asymptomatic   Anticoagulant long-term use      eliquis    Benign localized prostatic hyperplasia with lower urinary tract symptoms (LUTS)     CAD (coronary artery disease)      s/p CABG in 1997, patent grafts by cath in 2005, low-risk NST in 2012 and 12/2020   DDD (degenerative disc disease), cervical     Full dentures     History of radiation therapy 11/24/12-01/18/13    Prostate 78Gy/75fx   HOH (hard of hearing)      both   Hyperlipidemia     Hyperlipidemia     Hypertension     Permanent atrial fibrillation (HCC) 09/2012   Phimosis      severe    Proliferative diabetic retinopathy (HCC)      both eyes   Prostate cancer [facility]) urologist-- dr alvaro    dx 09-02-2012-- Stage T2a, Gleason 3+4=7, PSA 8.12, vol 103 cc--- completed external beam radiation 02/ 2014;  recurrent 2015 , started hormone therapy   S/P CABG x 3 1997   Strains to urinate     Type 2 diabetes mellitus treated with insulin  [facility])      endocrinologist-- dr von               Past Surgical History:  Procedure Laterality Date   CARDIAC CATHETERIZATION   01-18-2004   dr tisa    patent grafts, ef 55%, minimal anterior hypokinesis,  severe total occlusion of LCFx and LAD,  severe disease of D2 and segmental RCA   CARDIOVASCULAR STRESS TEST   01/07/2011    normal nuclear study w/ no ischemia/  normal LV function and wall motion,  ef 60%   CARPAL TUNNEL RELEASE Right     CATARACT EXTRACTION W/ INTRAOCULAR LENS  IMPLANT, BILATERAL   left 2006;  right 2010   CIRCUMCISION N/A 07/28/2018    Procedure: CIRCUMCISION ADULT;  Surgeon: [name], MD;  Location:  [facility];  Service: Urology;  Laterality: N/A;   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   CORONARY ANGIOPLASTY   1990    LAD   CORONARY ARTERY BYPASS GRAFT   11-1995    shows distal LAD disease, without recurrent symptoms of angina with questionable mild apical ischemia but with normal EF    ESOPHAGOGASTRODUODENOSCOPY (EGD) WITH ESOPHAGEAL DILATION   05/2012   KNEE ARTHROSCOPY Left     TRANSTHORACIC ECHOCARDIOGRAM   10/10/2012    ef 55%, akinesis of the inferolateral wall,  grade 2 diastolic dysfunction/  trivial AR/  mild MR and TR/  mild LAE/  mild RVSF reduced and mild dilated RV               Family History  Problem Relation Age of Onset   Stroke Father     Heart attack Mother     Heart disease Mother     Cancer Sister          lung   Cancer Brother          prostate   Diabetes Brother     Stroke Brother     Stroke Brother     Heart disease Brother     Heart disease Brother      Heart attack Sister     Heart disease Sister     Heart attack Sister     Cancer Sister          breast   Heart disease Sister     Heart attack Sister     Heart disease Sister     Heart disease Sister            SOCIAL HISTORY: Social History         Tobacco Use   Smoking status: Former      Current packs/day: 0.00      Types: Cigarettes      Start date: 10/14/1959      Quit date: 10/14/1979      Years since quitting: 44.7   Smokeless tobacco: Former      Types: Chew      Quit date: 11/23/2012  Substance Use Topics   Alcohol use: No      Allergies      Allergies  Allergen Reactions   Cephalexin  Nausea And Vomiting   Tramadol  Palpitations              Current Outpatient Medications  Medication Sig Dispense Refill   acetaminophen  (TYLENOL ) 500 MG tablet Take 1,300 mg by mouth at bedtime.       alfuzosin (UROXATRAL) 10 MG 24 hr tablet Take 10 mg by mouth at bedtime. Reported on 12/18/2015       amLODipine  (NORVASC ) 10 MG tablet TAKE 1 TABLET DAILY 90 tablet 3   atorvastatin  (LIPITOR) 40 MG tablet TAKE 1 TABLET DAILY 90 tablet 3   Blood Glucose Monitoring Suppl (FREESTYLE LITE) w/Device KIT 1 Device by Does not apply route 2 (two) times daily. 1 kit 0   Cinnamon 500 MG capsule Take 500 mg by mouth 2 (two) times daily.       Continuous Glucose Sensor (FREESTYLE LIBRE 3 SENSOR) MISC 1 each by Does not apply route every 14 (fourteen) days. 6 each 1   ELIQUIS  5 MG TABS tablet TAKE 1 TABLET TWICE A DAY 180 tablet 3   ezetimibe  (ZETIA ) 10 MG tablet TAKE 1 TABLET DAILY 90 tablet 3   ferrous sulfate 325 (65 FE) MG tablet  Take 1 tablet by mouth daily.       finasteride (PROSCAR) 5 MG tablet Take 5 mg by mouth every morning. Reported on 12/18/2015       Glucagon  (GVOKE HYPOPEN  1-PACK) 1 MG/0.2ML SOAJ Inject 1 mg into the skin as needed (low blood sugar with impaired consciousness). 0.4 mL 2   glucose blood (FREESTYLE LITE) test strip USE TO TEST TWICE A DAY 100 strip 2   Insulin   Lispro Prot & Lispro (HUMALOG  MIX 75/25 KWIKPEN) (75-25) 100 UNIT/ML Kwikpen 12 units with breakfast and 8 units with supper. 30 mL 4   Insulin  Pen Needle (PEN NEEDLES) 31G X 5 MM MISC 1 each by Does not apply route 2 (two) times daily. 180 each 2   lisinopril  (ZESTRIL ) 20 MG tablet TAKE 1 TABLET DAILY 90 tablet 3   metFORMIN  (GLUCOPHAGE -XR) 750 MG 24 hr tablet Take 1 tablet (750 mg total) by mouth 2 (two) times daily with a meal. (Patient not taking: Reported on 06/13/2024) 180 tablet 3   nitroGLYCERIN  (NITROSTAT ) 0.4 MG SL tablet Place 1 tablet (0.4 mg total) under the tongue every 5 (five) minutes as needed for chest pain. 25 tablet 3   Omega-3 Fatty Acids (OMEGA-3 FISH OIL PO) Take 1 capsule by mouth daily.       pantoprazole  (PROTONIX ) 40 MG tablet TAKE 1 TABLET DAILY 90 tablet 3   prednisoLONE acetate (PRED FORTE) 1 % ophthalmic suspension PLACE 1 DROP INTO THE RIGHT EYE DAILY.          No current facility-administered medications for this visit.        REVIEW OF SYSTEMS:  [X]  denotes positive finding, [ ]  denotes negative finding Cardiac   Comments:  Chest pain or chest pressure:      Shortness of breath upon exertion:      Short of breath when lying flat:      Irregular heart rhythm:             Vascular      Pain in calf, thigh, or hip brought on by ambulation:      Pain in feet at night that wakes you up from your sleep:       Blood clot in your veins:      Leg swelling:              Pulmonary      Oxygen at home:      Productive cough:       Wheezing:              Neurologic      Sudden weakness in arms or legs:       Sudden numbness in arms or legs:       Sudden onset of difficulty speaking or slurred speech:      Temporary loss of vision in one eye:       Problems with dizziness:              Gastrointestinal      Blood in stool:       Vomited blood:              Genitourinary      Burning when urinating:       Blood in urine:             Psychiatric       Major depression:              Hematologic  Bleeding problems:      Problems with blood clotting too easily:             Skin      Rashes or ulcers:             Constitutional      Fever or chills:          PHYSICAL EXAM: There were no vitals filed for this visit.         GENERAL: The patient is a well-nourished male, in no acute distress. The vital signs are documented above. CARDIAC: There is a regular rate and rhythm.  VASCULAR:  2+ palpable femoral pulse bilaterally No palpable pedal pulses PULMONARY: No respiratory distress. ABDOMEN: Soft and non-tender.  No pain with palpation of aneurysm. MUSCULOSKELETAL: There are no major deformities or cyanosis. NEUROLOGIC: No focal weakness or paresthesias are detected. SKIN: There are no ulcers or rashes noted. PSYCHIATRIC: The patient has a normal affect.   DATA:    CT abd/pelvis reviewed 06/19/24 with 5.5 cm AAA   AAA duplex 06/13/24 showed aneurysm 5.5 cm --> 5.56 cm over past 6 months by duplex   CTA 05/11/23: 5.0 cm AAA   Assessment/Plan:   89 y.o. male with history of diabetes, hypertension, hyperlipidemia, prostate cancer, A. Fib on eliquis , known abdominal aortic aneurysm, coronary disease status post CABG that presents for follow-up after CT for further evaluation of his AAA.     Discussed his aneurysm now measures 5.5 cm after review of recent CT.  I have recommended stent graft repair.  I discussed in men we repair these greater than 5.5 cm given risk of rupture.  I discussed he currently has a 10 to 15% yearly rupture risk.  He certainly meets criteria for repair.  Discussed transfemoral access in the operating room with stent graft repair.  Risk benefits discussed including risk of vessel injury, heart attack, stroke, infection, risk of anesthesia etc. he will have to hold his Eliquis .  He is seeing his cardiologist tomorrow.   Lonni DOROTHA Gaskins, MD [facility] [place] Office:  [phone]

## 2024-08-30 NOTE — Anesthesia Procedure Notes (Signed)
 Arterial Line Insertion Start/End10/06/2024 8:15 AM Performed by: Maryclare Cornet, MD, Alen Bernardino BIRCH, CRNA, CRNA  Patient location: Pre-op. Preanesthetic checklist: patient identified, IV checked, site marked, risks and benefits discussed, surgical consent, monitors and equipment checked, pre-op evaluation, timeout performed and anesthesia consent Lidocaine  1% used for infiltration Right, radial was placed Catheter size: 20 G Hand hygiene performed  and maximum sterile barriers used   Attempts: 1 Following insertion, dressing applied. Post procedure assessment: normal and unchanged  Patient tolerated the procedure well with no immediate complications.

## 2024-08-30 NOTE — Telephone Encounter (Signed)
 Paperwork collected from folder

## 2024-08-30 NOTE — Anesthesia Procedure Notes (Signed)
 Procedure Name: Intubation Date/Time: 08/30/2024 8:47 AM  Performed by: Alen Motto D, CRNAPre-anesthesia Checklist: Patient identified, Emergency Drugs available, Suction available and Patient being monitored Patient Re-evaluated:Patient Re-evaluated prior to induction Oxygen Delivery Method: Circle System Utilized Preoxygenation: Pre-oxygenation with 100% oxygen Induction Type: IV induction Ventilation: Mask ventilation without difficulty and Oral airway inserted - appropriate to patient size Laryngoscope Size: Cleotilde and 3 Grade View: Grade I Tube type: Oral Tube size: 7.5 mm Number of attempts: 1 Airway Equipment and Method: Stylet and Oral airway Placement Confirmation: ETT inserted through vocal cords under direct vision, positive ETCO2 and breath sounds checked- equal and bilateral Secured at: 23 cm Tube secured with: Tape Dental Injury: Teeth and Oropharynx as per pre-operative assessment

## 2024-08-30 NOTE — Transfer of Care (Signed)
 Immediate Anesthesia Transfer of Care Note  Patient: Steve Bailey  Procedure(s) Performed: INSERTION, ENDOVASCULAR STENT GRAFT, AORTA, ABDOMINAL  Patient Location: PACU  Anesthesia Type:General  Level of Consciousness: awake, alert , and oriented  Airway & Oxygen Therapy: Patient Spontanous Breathing and Patient connected to face mask oxygen  Post-op Assessment: Report given to RN and Post -op Vital signs reviewed and stable  Post vital signs: Reviewed and stable  Last Vitals:  Vitals Value Taken Time  BP    Temp    Pulse 69 08/30/24 10:30  Resp 13 08/30/24 10:30  SpO2 90 % 08/30/24 10:30  Vitals shown include unfiled device data.  Last Pain:  Vitals:   08/30/24 0705  TempSrc: Oral  PainSc: 0-No pain         Complications: There were no known notable events for this encounter.

## 2024-08-30 NOTE — Op Note (Signed)
 Date: August 30, 2024  Preoperative diagnosis: 5.5 cm infrarenal abdominal aortic aneurysm  Postoperative diagnosis: Same  Procedure: 1.  Ultrasound-guided access bilateral common femoral arteries for delivery of endograft with Perclose closure 2.  Abdominal aortogram with catheter selection of aorta 3.  Repair of abdominal aortic aneurysm with aortobiiliac stent graft (main body right with a 28 mm x 14 mm x 16 cm, left iliac extension with a 14 mm x 14 cm, and right iliac extension with a 14 mm x 7 cm)  Surgeon: Dr. Lonni DOROTHA Gaskins, MD  Assistant: OR staff  Indications: 89 year old male seen with a 5.5 cm infrarenal abdominal aortic aneurysm.  He presents today for stent graft repair after risks benefits discussed.  Findings: Ultrasound-guided access bilateral common femoral arteries with delivery of Perclose closure.  18 French Gore dry seal was placed in the right common femoral artery and a 12 French Gore dry seal placed in the left common femoral artery.  Main body device was delivered up the right which was a 28 mm x 14 mm x 16 cm deployed below the left renal (lowest renal artery).  After the gate was cannulated on the left, I extended with a 14 mm x 14 cm limb into the common iliac artery with preservation of the hypogastric.  On the right I did have to extend with a 14 mm x 7 cm with preservation of the hypogastric.  No evidence of endoleak at completion with exclusion of aneurysm.  Anesthesia: General  Details: Patient was taken to the operating room after informed consent was obtained.  Placed on the operative table supine position.  General endotracheal anesthesia was induced.  The bilateral groins and abdominal wall were then prepped and draped standard sterile fashion.  Antibiotics were given and timeout performed.  Initially started in the right groin and used ultrasound to evaluate the common femoral artery that was patent and image was saved.  This was accessed with micro  access needle placed a microwire and then I made a stab incision with 11 blade scalpel and hemostat I then placed a micro sheath Bentson wire dilated with an 8 French dilator and deployed Perclose closure devices at 10:00 and 2:00 in the right common femoral artery.  I did the same steps in the left common femoral artery again accessing this with ultrasound guidance placing Perclose closure at 10:00 and 2:00.  I then put 8 French sheath in both groins.  Patient given 100 units/kg IV heparin.  ACT was checked and maintained greater than 250.  I then used a KMP to exchanged for stiff Amplatz wires in both groins.  I upsized to an 2 [place] Gore dry seal sheath in the right common femoral artery and a 12 French Gore dry seal sheath in the left common femoral artery.  I then delivered the main body device up the right.  The pigtail catheter was delivered up the left.  We got a abdominal aortogram with then identified the lowest left renal.  The main body device was then deployed just below the left renal down to the gate.  I then came from the left groin with a buddy wire and I was able to cannulate the gate with a KMP catheter and a soft angled Glidewire.  I then spun my catheter in the main body device to confirm I was in the true lumen.  I then placed an Amplatz wire and then used a marker pigtail catheter and got a retrograde sheath shot on  the left identify the hypogastric.  We extended with a limb on the left into the common iliac artery with preservation of the hypogastric.  I then finished up with the main body on the right.  This was little bit short we did a retrograde sheath shot to identify the hypogastric and then extended with a second limb on the right into the common iliac with preservation of the hypogastric.  I then used my MOB balloon and treated all proximal distal landing zones and overlap components.  Final imaging showed exclusion of the aneurysm with no endoleak.  We then removed the 18 [place]  Gore dry seal from the right groin and tied down the Perclose's at 10:00 and 2:00 with good hemostasis and I did take a microcatheter for a groin shot and this looked widely patent.  We then completed tying down the Perclose devices.  I then did the same steps in the left groin to remove the 12 [place] Gore dry seal sheath and tying down the Perclose's with good hemostasis and taking a microcatheter for groin shot.  Patient had good PT Doppler signals bilaterally.  Protamine was given for reversal.  Taken to recovery in stable condition and I did cut the sutures in both groins used 4-0 Monocryl and Dermabond.  Complication: None  Condition: Stable  Lonni DOROTHA Gaskins, MD [facility] [place] Office: [phone]   Lonni JINNY Gaskins

## 2024-08-30 NOTE — Progress Notes (Signed)
Pt arrived from ...PACU.., A/ox ..4.pt denies any pain, MD aware,CCMD called. CHG bath given,no further needs at this time    

## 2024-08-30 NOTE — Telephone Encounter (Signed)
 Patient's daughter, Dickey Goldberg, came in to office today and brought a [employer] for Persons with Diabetes- Statement of Certifying Physician form to be completed.  The form is in Dr. Eugenio folder in the front office.

## 2024-08-31 ENCOUNTER — Encounter (HOSPITAL_COMMUNITY): Payer: Self-pay | Admitting: Vascular Surgery

## 2024-08-31 DIAGNOSIS — Z9889 Other specified postprocedural states: Secondary | ICD-10-CM

## 2024-08-31 DIAGNOSIS — Z95828 Presence of other vascular implants and grafts: Secondary | ICD-10-CM

## 2024-08-31 LAB — BASIC METABOLIC PANEL WITH GFR
Anion gap: 10 (ref 5–15)
BUN: 21 mg/dL (ref 8–23)
CO2: 23 mmol/L (ref 22–32)
Calcium: 9 mg/dL (ref 8.9–10.3)
Chloride: 104 mmol/L (ref 98–111)
Creatinine, Ser: 1.4 mg/dL — ABNORMAL HIGH (ref 0.61–1.24)
GFR, Estimated: 48 mL/min — ABNORMAL LOW (ref >60)
Glucose, Bld: 208 mg/dL — ABNORMAL HIGH (ref 70–99)
Potassium: 4.3 mmol/L (ref 3.5–5.1)
Sodium: 137 mmol/L (ref 135–145)

## 2024-08-31 LAB — CBC
HCT: 28.9 % — ABNORMAL LOW (ref 39.0–52.0)
Hemoglobin: 10.1 g/dL — ABNORMAL LOW (ref 13.0–17.0)
MCH: 34.4 pg — ABNORMAL HIGH (ref 26.0–34.0)
MCHC: 34.9 g/dL (ref 30.0–36.0)
MCV: 98.3 fL (ref 80.0–100.0)
Platelets: 87 K/uL — ABNORMAL LOW (ref 150–400)
RBC: 2.94 MIL/uL — ABNORMAL LOW (ref 4.22–5.81)
RDW: 12.8 % (ref 11.5–15.5)
WBC: 7.4 K/uL (ref 4.0–10.5)
nRBC: 0 % (ref 0.0–0.2)

## 2024-08-31 LAB — GLUCOSE, CAPILLARY: Glucose-Capillary: 192 mg/dL — ABNORMAL HIGH (ref 70–99)

## 2024-08-31 LAB — POCT ACTIVATED CLOTTING TIME
Activated Clotting Time: 124 s
Activated Clotting Time: 239 s
Activated Clotting Time: 251 s

## 2024-08-31 NOTE — Progress Notes (Incomplete)
 BP elevated x 3 this a.m.  Cassandra RN notified.  Per [facility] PA cleared to continue, he will resume BP regime at home..  Patient Hard of hearing and denies pain or discomfort.  Sitting up in bed.  No distress noted.

## 2024-08-31 NOTE — TOC Transition Note (Signed)
 Transition of Care (TOC) - Discharge Note [name] RN, BSN Inpatient Care Management Unit 4E- RN Case Manager See Treatment Team for direct phone #   Patient Details  Name: Steve Bailey MRN: [id number] Date of Birth: Oct 15, 1934  Transition of Care [facility]) CM/SW Contact:  Steve Bailey Hurst, RN Phone Number: 08/31/2024, 11:07 AM   Clinical Narrative:    Pt stable for transition home today, CM notified by [facility] liaison that [facility] office made pre-op referral for [facility] needs. Liaison will follow up with pt post discharge   Family to transport home.   No further CM needs noted.    Final next level of care: Home w Home Health Services Barriers to Discharge: No Barriers Identified   Patient Goals and CMS Choice Patient states their goals for this hospitalization and ongoing recovery are:: return home          Discharge Placement                 Home      Discharge Plan and Services Additional resources added to the After Visit Summary for     Discharge Planning Services: CM Consult            DME Arranged: N/A DME Agency: NA         HH Agency: [facility] Date HH Agency Contacted: 08/31/24 Time HH Agency Contacted: 0930 Representative spoke with at [facility] Agency: [facility]  Social Drivers of Health (SDOH) Interventions SDOH Screenings   Food Insecurity: Unknown (08/01/2024)   Received from [facility]  Housing: Low Risk  (08/01/2024)   Received from [facility]  Utilities: Unknown (08/01/2024)   Received from [facility]  Tobacco Use: Medium Risk (08/30/2024)     Readmission Risk Interventions    08/31/2024   11:07 AM  Readmission Risk Prevention Plan  Post Dischage Appt Complete  Medication Screening Complete  Transportation Screening Complete

## 2024-08-31 NOTE — Anesthesia Postprocedure Evaluation (Signed)
 Anesthesia Post Note  Patient: Steve Bailey  Procedure(s) Performed: INSERTION, ENDOVASCULAR STENT GRAFT, AORTA, ABDOMINAL     Patient location during evaluation: PACU Anesthesia Type: General Level of consciousness: awake and alert Pain management: pain level controlled Vital Signs Assessment: post-procedure vital signs reviewed and stable Respiratory status: spontaneous breathing, nonlabored ventilation, respiratory function stable and patient connected to nasal cannula oxygen Cardiovascular status: blood pressure returned to baseline and stable Postop Assessment: no apparent nausea or vomiting Anesthetic complications: no   There were no known notable events for this encounter.  Last Vitals:  Vitals:   08/31/24 0944 08/31/24 0945  BP: (!) 176/58 (!) 185/63  Pulse:    Resp:    Temp:    SpO2: 99%     Last Pain:  Vitals:   08/31/24 0748  TempSrc: Oral  PainSc:                  Ingrid Shifrin S

## 2024-08-31 NOTE — Discharge Instructions (Addendum)

## 2024-08-31 NOTE — Progress Notes (Signed)
 Discharge  Daughter verbally understands discharge POC.  Extensive teaching regarding home care, site care, healthy eating.  Additional education included in AVS.  PIV's removed Pressure dressings applied to B/l hands.  CCMD/Tele/ Dorian.    Daughter spoke with Surgeon at bedside.    Volunteer called to [facility].

## 2024-08-31 NOTE — Progress Notes (Addendum)
  Progress Note    08/31/2024 8:06 AM 1 Day Post-Op  Subjective:  tired says he did not sleep all night, no other complaints. No back or abdominal pain. No pain in legs   Vitals:   08/31/24 0200 08/31/24 0748  BP: (!) 162/53 (!) 180/70  Pulse: (!) 48 73  Resp: 16 18  Temp:  98.7 F (37.1 C)  SpO2: 97% 100%   Physical Exam: Cardiac:  regular Lungs:  non labored Incisions:  B groin incisions c/d/I soft without hematoma Extremities:  Brisk DP signals bilaterally Abdomen:  soft, non distended, non tender Neurologic: alert and oriented   CBC    Component Value Date/Time   WBC 7.4 08/31/2024 0300   RBC 2.94 (L) 08/31/2024 0300   HGB 10.1 (L) 08/31/2024 0300   HCT 28.9 (L) 08/31/2024 0300   PLT 87 (L) 08/31/2024 0300   MCV 98.3 08/31/2024 0300   MCH 34.4 (H) 08/31/2024 0300   MCHC 34.9 08/31/2024 0300   RDW 12.8 08/31/2024 0300   LYMPHSABS 0.9 01/24/2021 1204   MONOABS 0.3 01/24/2021 1204   EOSABS 0.1 01/24/2021 1204   BASOSABS 0.0 01/24/2021 1204    BMET    Component Value Date/Time   NA 137 08/31/2024 0300   NA 140 09/11/2016 1645   K 4.3 08/31/2024 0300   CL 104 08/31/2024 0300   CO2 23 08/31/2024 0300   GLUCOSE 208 (H) 08/31/2024 0300   BUN 21 08/31/2024 0300   BUN 23 09/11/2016 1645   CREATININE 1.40 (H) 08/31/2024 0300   CREATININE 1.10 05/01/2024 1438   CALCIUM  9.0 08/31/2024 0300   GFRNONAA 48 (L) 08/31/2024 0300   GFRAA 69 09/11/2016 1645    INR    Component Value Date/Time   INR 1.2 08/30/2024 1045     Intake/Output Summary (Last 24 hours) at 08/31/2024 0806 Last data filed at 08/31/2024 0743 Gross per 24 hour  Intake 500 ml  Output 1175 ml  Net -675 ml     Assessment/Plan:  89 y.o. male is s/p 1.  Ultrasound-guided access bilateral common femoral arteries for delivery of endograft with Perclose closure 2.  Abdominal aortogram with catheter selection of aorta 3.  Repair of abdominal aortic aneurysm with aortobiiliac stent graft (main  body right with a 28 mm x 14 mm x 16 cm, left iliac extension with a 14 mm x 14 cm, and right iliac extension with a 14 mm x 7 cm) 1 Day Post-Op   B groin access sites soft without hematoma BLE well perfused and warm with doppler DP signals Scr about baseline 1.4 H&H stable Will have him ambulate this morning Will plan to resume his home Eliquis  tomorrow He is otherwise stable for discharge home later today once he has ambulated Follow up will be arranged in 1 month with CTA abdomen/ pelvis    Teretha Damme, PA-C [facility] [phone] 08/31/2024 8:06 AM  I have seen and evaluated the patient. I agree with the PA note as documented above.  Postop day 1 status post aortobiiliac stent graft for 5.5 cm abdominal aortic aneurysm.  Groins look good without hematoma.  Brisk PT signals bilaterally.  Labs reassuring.  Plan discharge today with follow-up in 1 month with CTA.  Can resume his Eliquis  tomorrow as we discussed.  Lonni DOROTHA Gaskins, MD [facility] [place] Office: [phone]

## 2024-09-05 NOTE — Progress Notes (Signed)
 [facility] Discharge Summary   Steve Bailey May 10, 1934 89 y.o. male  MRN: [id number]  Admission Date: 08/30/2024  Discharge Date: 08/31/2024  Physician: Lonni Gaskins, MD  Admission Diagnosis: Infrarenal abdominal aortic aneurysm (AAA) without rupture [I71.43] S/P AAA repair [Z98.890, Z86.79]  Hospital Course:  The patient was admitted to the hospital and taken to the operating room on 08/30/2024 and underwent: 1.  Ultrasound-guided access bilateral common femoral arteries for delivery of endograft with Perclose closure 2.  Abdominal aortogram with catheter selection of aorta 3.  Repair of abdominal aortic aneurysm with aortobiiliac stent graft (main body right with a 28 mm x 14 mm x 16 cm, left iliac extension with a 14 mm x 14 cm, and right iliac extension with a 14 mm x 7 cm)  The pt tolerated the procedure well and was transported to the PACU in good condition.   The remainder of the hospital course consisted of increasing mobilization and increasing intake of solids without difficulty.  He remained without any back or abdominal pain. Bilateral groin incisions soft without swelling or hematoma. Hemodynamically stable. Serum Creatinine at baseline. Tolerated ambulation without issues. He remained stable for discharge home. He will resume his home medications including Eliquis . Eliquis  to be restarted 09/01/24. Patient to follow up with Dr. Gaskins in 1 month with CTA abdomen/ pelvis.    CBC    Component Value Date/Time   WBC 7.4 08/31/2024 0300   RBC 2.94 (L) 08/31/2024 0300   HGB 10.1 (L) 08/31/2024 0300   HCT 28.9 (L) 08/31/2024 0300   PLT 87 (L) 08/31/2024 0300   MCV 98.3 08/31/2024 0300   MCH 34.4 (H) 08/31/2024 0300   MCHC 34.9 08/31/2024 0300   RDW 12.8 08/31/2024 0300   LYMPHSABS 0.9 01/24/2021 1204   MONOABS 0.3 01/24/2021 1204   EOSABS 0.1 01/24/2021 1204   BASOSABS 0.0 01/24/2021 1204    BMET    Component Value Date/Time   NA 137 08/31/2024 0300   NA 140  09/11/2016 1645   K 4.3 08/31/2024 0300   CL 104 08/31/2024 0300   CO2 23 08/31/2024 0300   GLUCOSE 208 (H) 08/31/2024 0300   BUN 21 08/31/2024 0300   BUN 23 09/11/2016 1645   CREATININE 1.40 (H) 08/31/2024 0300   CREATININE 1.10 05/01/2024 1438   CALCIUM  9.0 08/31/2024 0300   GFRNONAA 48 (L) 08/31/2024 0300   GFRAA 69 09/11/2016 1645       Discharge Instructions     ABDOMINAL PROCEDURE/ANEURYSM REPAIR/AORTO-BIFEMORAL BYPASS:  Call MD for increased abdominal pain; cramping diarrhea; nausea/vomiting   Complete by: As directed    Call MD for:  redness, tenderness, or signs of infection (pain, swelling, bleeding, redness, odor or green/yellow discharge around incision site)   Complete by: As directed    Call MD for:  severe or increased pain, loss or decreased feeling  in affected limb(s)   Complete by: As directed    Call MD for:  temperature >100.5   Complete by: As directed    Discharge instructions   Complete by: As directed    Resume home Eliquis  on 09/01/2024   Driving Restrictions   Complete by: As directed    No driving for 2 days   Increase activity slowly   Complete by: As directed    Walk with assistance use walker or cane as needed   Lifting restrictions   Complete by: As directed    No lifting for 2 weeks   Resume previous diet  Complete by: As directed    may wash over wound with mild soap and water   Complete by: As directed        Discharge Diagnosis:  Infrarenal abdominal aortic aneurysm (AAA) without rupture [I71.43] S/P AAA repair [S01.109, Z86.79]  Secondary Diagnosis: Patient Active Problem List   Diagnosis Date Noted   S/P AAA repair 08/30/2024   Orthostatic hypotension 07/25/2024   Preop cardiovascular exam 07/19/2024   Exudative age-related macular degeneration of left eye with active choroidal neovascularization (HCC) 12/14/2022   Fuchs' corneal dystrophy of left eye 12/14/2022   Status post corneal transplant 12/14/2022    Pancytopenia (HCC) 08/27/2022   Actinic keratoses 08/09/2019   Proliferative diabetic retinopathy of both eyes with macular edema associated with type 2 diabetes mellitus (HCC) 02/25/2016   Pseudophakia of both eyes 02/25/2016   Crushing injury of right hand 12/25/2015   AAA (abdominal aortic aneurysm) without rupture 12/18/2015   Non compliance w medication regimen 01/08/2014   Atrial fibrillation (HCC)    Prostate cancer (HCC)    Adult onset hypothyroidism 10/10/2012   Hypertension 10/10/2012   Diabetic macular edema (HCC) 07/22/2012   Macula scar of posterior pole 07/22/2012   Coronary artery disease 03/11/2012   Hyperlipidemia 03/11/2012   Type 2 diabetes mellitus with hyperlipidemia (HCC) 03/11/2012   Proliferative diabetic retinopathy (HCC) 12/29/2011   Past Medical History:  Diagnosis Date   AAA (abdominal aortic aneurysm) followed by dr laurence   last duplex 06/28/2018 4.6 cm,  asymptomatic   Anticoagulant long-term use    eliquis    Benign localized prostatic hyperplasia with lower urinary tract symptoms (LUTS)    CAD (coronary artery disease)    s/p CABG in 1997, patent grafts by cath in 2005, low-risk NST in 2012 and 12/2020   DDD (degenerative disc disease), cervical    Full dentures    History of radiation therapy 11/24/12-01/18/13   Prostate 78Gy/56fx   HOH (hard of hearing)    both   Hyperlipidemia    Hyperlipidemia    Hypertension    Permanent atrial fibrillation (HCC) 09/2012   Phimosis    severe   Proliferative diabetic retinopathy (HCC)    both eyes   Prostate cancer [facility]) urologist-- dr alvaro   dx 09-02-2012-- Stage T2a, Gleason 3+4=7, PSA 8.12, vol 103 cc--- completed external beam radiation 02/ 2014;  recurrent 2015 , started hormone therapy   S/P CABG x 3 1997   Strains to urinate    Type 2 diabetes mellitus treated with insulin  [facility])    endocrinologist-- dr von     Allergies as of 08/31/2024       Reactions   Cephalexin  Nausea And Vomiting    Tramadol  Palpitations        Medication List     TAKE these medications    acetaminophen  650 MG CR tablet Commonly known as: TYLENOL  Take 1,300 mg by mouth at bedtime.   alfuzosin 10 MG 24 hr tablet Commonly known as: UROXATRAL Take 10 mg by mouth at bedtime. Reported on 12/18/2015   amLODipine  10 MG tablet Commonly known as: NORVASC  TAKE 1 TABLET DAILY   atorvastatin  40 MG tablet Commonly known as: LIPITOR TAKE 1 TABLET DAILY   Cinnamon 500 MG capsule Take 500 mg by mouth 2 (two) times daily.   Eliquis  5 MG Tabs tablet Generic drug: apixaban  TAKE 1 TABLET TWICE A DAY   ezetimibe  10 MG tablet Commonly known as: ZETIA  TAKE 1 TABLET DAILY   ferrous sulfate 325 (  65 FE) MG tablet Take 325 mg by mouth every other day.   finasteride 5 MG tablet Commonly known as: PROSCAR Take 5 mg by mouth every morning.   FreeStyle Libre 3 Sensor Misc 1 each by Does not apply route every 14 (fourteen) days.   FREESTYLE LITE test strip Generic drug: glucose blood USE TO TEST TWICE A DAY   FreeStyle Lite w/Device Kit 1 Device by Does not apply route 2 (two) times daily.   Gvoke HypoPen  1-Pack 1 MG/0.2ML Soaj Generic drug: Glucagon  Inject 1 mg into the skin as needed (low blood sugar with impaired consciousness).   Insulin  Lispro Prot & Lispro (75-25) 100 UNIT/ML Kwikpen Commonly known as: HumaLOG  Mix 75/25 KwikPen 15 units with breakfast and 6-8 units with supper.   lisinopril  20 MG tablet Commonly known as: ZESTRIL  TAKE 1 TABLET DAILY   nitroGLYCERIN  0.4 MG SL tablet Commonly known as: NITROSTAT  Place 1 tablet (0.4 mg total) under the tongue every 5 (five) minutes as needed for chest pain.   OMEGA-3 FISH OIL PO Take 1 capsule by mouth daily.   pantoprazole  40 MG tablet Commonly known as: PROTONIX  TAKE 1 TABLET DAILY   Pen Needles 31G X 5 MM Misc 1 each by Does not apply route 2 (two) times daily.   prednisoLONE acetate 1 % ophthalmic suspension Commonly known  as: PRED FORTE Place 1 drop into the right eye in the morning and at bedtime.   sodium chloride  5 % ophthalmic solution Commonly known as: MURO 128 Place 1 drop into the right eye in the morning, at noon, in the evening, and at bedtime.        Discharge Instructions:   [facility]  Discharge Instructions Endovascular Aortic Aneurysm Repair  Please refer to the following instructions for your post-procedure care. Your surgeon or Physician Assistant will discuss any changes with you.  Activity  You are encouraged to walk as much as you can. You can slowly return to normal activities but must avoid strenuous activity and heavy lifting until your doctor tells you it's OK. Avoid activities such as vacuuming or swinging a gold club. It is normal to feel tired for several weeks after your surgery. Do not drive until your doctor gives the OK and you are no longer taking prescription pain medications. It is also normal to have difficulty with sleep habits, eating, and bowel movements after surgery. These will go away with time.  Bathing/Showering  You may shower after you go home. If you have an incision, do not soak in a bathtub, hot tub, or swim until the incision heals completely.  Incision Care  Shower every day. Clean your incision with mild soap and water. Pat the area dry with a clean towel. You do not need a bandage unless otherwise instructed. Do not apply any ointments or creams to your incision. If you clothing is irritating, you may cover your incision with a dry gauze pad.  Diet  Resume your normal diet. There are no special food restrictions following this procedure. A low fat/low cholesterol diet is recommended for all patients with vascular disease. In order to heal from your surgery, it is CRITICAL to get adequate nutrition. Your body requires vitamins, minerals, and protein. Vegetables are the best source of vitamins and minerals. Vegetables  also provide the perfect balance of protein. Processed food has little nutritional value, so try to avoid this.  Medications  Resume taking all of your medications unless your doctor  or Physician Assistnat tells you not to. If your incision is causing pain, you may take over-the-counter pain relievers such as acetaminophen  (Tylenol ). If you were prescribed a stronger pain medication, please be aware these medications can cause nausea and constipation. Prevent nausea by taking the medication with a snack or meal. Avoid constipation by drinking plenty of fluids and eating foods with a high amount of fiber, such as fruits, vegetables, and grains. Do not take Tylenol  if you are taking prescription pain medications.   Follow up  Our office will schedule a follow-up appointment with a C.T. scan 3-4 weeks after your surgery.  Please call us  immediately for any of the following conditions  Severe or worsening pain in your legs or feet or in your abdomen back or chest. Increased pain, redness, drainage (pus) from your incision sit. Increased abdominal pain, bloating, nausea, vomiting or persistent diarrhea. Fever of 101 degrees or higher. Swelling in your leg (s),  Reduce your risk of vascular disease  Stop smoking. If you would like help call [facility] at 1-800-QUIT-NOW (315-165-5867) or [place] at [phone]. Manage your cholesterol Maintain a desired weight Control your diabetes Keep your blood pressure down  If you have questions, please call the office at [phone].    Prescriptions given: None  Disposition: Home  Patient's condition: is Good  Follow up: 1. Dr. Gretta in 4 weeks with CTA protocol   Teretha Damme, PA-C [facility] [phone] 09/05/2024  8:07 AM   - For VQI Registry use - Post-op:  Time to Extubation: [X]  In OR, [ ]  < 12 hrs, [ ]  12-24 hrs, [ ]  >=24 hrs Vasopressors Req. Post-op: No MI: No., [ ]  Troponin only, [ ]  EKG or  Clinical New Arrhythmia: No CHF: No ICU Stay: no days in ICU Transfusion: No     If yes, 0 units given  Complications: Resp failure: No., [ ]  Pneumonia, [ ]  Ventilator Chg in renal function: No., [ ]  Inc. Cr > 0.5, [ ]  Temp. Dialysis,  [ ]  Permanent dialysis Leg ischemia: No., no Surgery needed, [ ]  Yes, Surgery needed,  [ ]  Amputation Bowel ischemia: No., [ ]  Medical Rx, [ ]  Surgical Rx Wound complication: No., [ ]  Superficial separation/infection, [ ]  Return to OR Return to OR: No  Return to OR for bleeding: No Stroke: No., [ ]  Minor, [ ]  Major  Discharge medications: Statin use:  Yes  ASA use:  No  Plavix use:  No  Beta blocker use:  No  ARB use:  No ACEI use:  Yes CCB use:  Yes

## 2024-09-05 NOTE — Discharge Summary (Signed)
 Date of Service: 08/31/2024 11:21 AM   Signed     Expand All Collapse All   EVAR Discharge Summary     Steve Bailey [birth year] 89 y.o. male   MRN: [id number]   Admission Date: 08/30/2024   Discharge Date: 08/31/2024   Physician: Lonni Gaskins, MD   Admission Diagnosis: Infrarenal abdominal aortic aneurysm (AAA) without rupture [I71.43] S/P AAA repair [Z98.890, Z86.79]   Hospital Course:  The patient was admitted to the hospital and taken to the operating room on 08/30/2024 and underwent: 1.  Ultrasound-guided access bilateral common femoral arteries for delivery of endograft with Perclose closure 2.  Abdominal aortogram with catheter selection of aorta 3.  Repair of abdominal aortic aneurysm with aortobiiliac stent graft (main body right with a 28 mm x 14 mm x 16 cm, left iliac extension with a 14 mm x 14 cm, and right iliac extension with a 14 mm x 7 cm)   The pt tolerated the procedure well and was transported to the PACU in good condition.    The remainder of the hospital course consisted of increasing mobilization and increasing intake of solids without difficulty.   He remained without any back or abdominal pain. Bilateral groin incisions soft without swelling or hematoma. Hemodynamically stable. Serum Creatinine at baseline. Tolerated ambulation without issues. He remained stable for discharge home. He will resume his home medications including Eliquis . Eliquis  to be restarted 09/01/24. Patient to follow up with Dr. Gaskins in 1 month with CTA abdomen/ pelvis.     CBC Labs (Brief)          Component Value Date/Time    WBC 7.4 08/31/2024 0300    RBC 2.94 (L) 08/31/2024 0300    HGB 10.1 (L) 08/31/2024 0300    HCT 28.9 (L) 08/31/2024 0300    PLT 87 (L) 08/31/2024 0300    MCV 98.3 08/31/2024 0300    MCH 34.4 (H) 08/31/2024 0300    MCHC 34.9 08/31/2024 0300    RDW 12.8 08/31/2024 0300    LYMPHSABS 0.9 01/24/2021 1204    MONOABS 0.3 01/24/2021 1204    EOSABS  0.1 01/24/2021 1204    BASOSABS 0.0 01/24/2021 1204        BMET Labs (Brief)          Component Value Date/Time    NA 137 08/31/2024 0300    NA 140 09/11/2016 1645    K 4.3 08/31/2024 0300    CL 104 08/31/2024 0300    CO2 23 08/31/2024 0300    GLUCOSE 208 (H) 08/31/2024 0300    BUN 21 08/31/2024 0300    BUN 23 09/11/2016 1645    CREATININE 1.40 (H) 08/31/2024 0300    CREATININE 1.10 05/01/2024 1438    CALCIUM  9.0 08/31/2024 0300    GFRNONAA 48 (L) 08/31/2024 0300    GFRAA 69 09/11/2016 1645              Discharge Instructions       ABDOMINAL PROCEDURE/ANEURYSM REPAIR/AORTO-BIFEMORAL BYPASS:  Call MD for increased abdominal pain; cramping diarrhea; nausea/vomiting   Complete by: As directed      Call MD for:  redness, tenderness, or signs of infection (pain, swelling, bleeding, redness, odor or green/yellow discharge around incision site)   Complete by: As directed      Call MD for:  severe or increased pain, loss or decreased feeling  in affected limb(s)   Complete by: As directed      Call MD for:  temperature >100.5   Complete by: As directed      Discharge instructions   Complete by: As directed      Resume home Eliquis  on 09/01/2024    Driving Restrictions   Complete by: As directed      No driving for 2 days    Increase activity slowly   Complete by: As directed      Walk with assistance use walker or cane as needed    Lifting restrictions   Complete by: As directed      No lifting for 2 weeks    Resume previous diet   Complete by: As directed      may wash over wound with mild soap and water   Complete by: As directed             Discharge Diagnosis:  Infrarenal abdominal aortic aneurysm (AAA) without rupture [I71.43] S/P AAA repair [S01.109, Z86.79]   Secondary Diagnosis:     Patient Active Problem List    Diagnosis Date Noted   S/P AAA repair 08/30/2024   Orthostatic hypotension 07/25/2024   Preop cardiovascular exam 07/19/2024   Exudative  age-related macular degeneration of left eye with active choroidal neovascularization (HCC) 12/14/2022   Fuchs' corneal dystrophy of left eye 12/14/2022   Status post corneal transplant 12/14/2022   Pancytopenia (HCC) 08/27/2022   Actinic keratoses 08/09/2019   Proliferative diabetic retinopathy of both eyes with macular edema associated with type 2 diabetes mellitus (HCC) 02/25/2016   Pseudophakia of both eyes 02/25/2016   Crushing injury of right hand 12/25/2015   AAA (abdominal aortic aneurysm) without rupture 12/18/2015   Non compliance w medication regimen 01/08/2014   Atrial fibrillation (HCC)     Prostate cancer (HCC)     Adult onset hypothyroidism 10/10/2012   Hypertension 10/10/2012   Diabetic macular edema (HCC) 07/22/2012   Macula scar of posterior pole 07/22/2012   Coronary artery disease 03/11/2012   Hyperlipidemia 03/11/2012   Type 2 diabetes mellitus with hyperlipidemia (HCC) 03/11/2012   Proliferative diabetic retinopathy (HCC) 12/29/2011        Past Medical History:  Diagnosis Date   AAA (abdominal aortic aneurysm) followed by dr laurence    last duplex 06/28/2018 4.6 cm,  asymptomatic   Anticoagulant long-term use      eliquis    Benign localized prostatic hyperplasia with lower urinary tract symptoms (LUTS)     CAD (coronary artery disease)      s/p CABG in 1997, patent grafts by cath in 2005, low-risk NST in 2012 and 12/2020   DDD (degenerative disc disease), cervical     Full dentures     History of radiation therapy 11/24/12-01/18/13    Prostate 78Gy/29fx   HOH (hard of hearing)      both   Hyperlipidemia     Hyperlipidemia     Hypertension     Permanent atrial fibrillation (HCC) 09/2012   Phimosis      severe   Proliferative diabetic retinopathy (HCC)      both eyes   Prostate cancer [facility]) urologist-- dr alvaro    dx 09-02-2012-- Stage T2a, Gleason 3+4=7, PSA 8.12, vol 103 cc--- completed external beam radiation 02/ 2014;  recurrent 2015 , started hormone  therapy   S/P CABG x 3 1997   Strains to urinate     Type 2 diabetes mellitus treated with insulin  [facility])      endocrinologist-- dr von  Allergies as of 08/31/2024         Reactions    Cephalexin  Nausea And Vomiting    Tramadol  Palpitations            Medication List       TAKE these medications     acetaminophen  650 MG CR tablet Commonly known as: TYLENOL  Take 1,300 mg by mouth at bedtime.    alfuzosin 10 MG 24 hr tablet Commonly known as: UROXATRAL Take 10 mg by mouth at bedtime. Reported on 12/18/2015    amLODipine  10 MG tablet Commonly known as: NORVASC  TAKE 1 TABLET DAILY    atorvastatin  40 MG tablet Commonly known as: LIPITOR TAKE 1 TABLET DAILY    Cinnamon 500 MG capsule Take 500 mg by mouth 2 (two) times daily.    Eliquis  5 MG Tabs tablet Generic drug: apixaban  TAKE 1 TABLET TWICE A DAY    ezetimibe  10 MG tablet Commonly known as: ZETIA  TAKE 1 TABLET DAILY    ferrous sulfate 325 (65 FE) MG tablet Take 325 mg by mouth every other day.    finasteride 5 MG tablet Commonly known as: PROSCAR Take 5 mg by mouth every morning.    FreeStyle Libre 3 Sensor Misc 1 each by Does not apply route every 14 (fourteen) days.    FREESTYLE LITE test strip Generic drug: glucose blood USE TO TEST TWICE A DAY    FreeStyle Lite w/Device Kit 1 Device by Does not apply route 2 (two) times daily.    Gvoke HypoPen  1-Pack 1 MG/0.2ML Soaj Generic drug: Glucagon  Inject 1 mg into the skin as needed (low blood sugar with impaired consciousness).    Insulin  Lispro Prot & Lispro (75-25) 100 UNIT/ML Kwikpen Commonly known as: HumaLOG  Mix 75/25 KwikPen 15 units with breakfast and 6-8 units with supper.    lisinopril  20 MG tablet Commonly known as: ZESTRIL  TAKE 1 TABLET DAILY    nitroGLYCERIN  0.4 MG SL tablet Commonly known as: NITROSTAT  Place 1 tablet (0.4 mg total) under the tongue every 5 (five) minutes as needed for chest pain.    OMEGA-3 FISH OIL  PO Take 1 capsule by mouth daily.    pantoprazole  40 MG tablet Commonly known as: PROTONIX  TAKE 1 TABLET DAILY    Pen Needles 31G X 5 MM Misc 1 each by Does not apply route 2 (two) times daily.    prednisoLONE acetate 1 % ophthalmic suspension Commonly known as: PRED FORTE Place 1 drop into the right eye in the morning and at bedtime.    sodium chloride  5 % ophthalmic solution Commonly known as: MURO 128 Place 1 drop into the right eye in the morning, at noon, in the evening, and at bedtime.             Discharge Instructions:    [facility]  Discharge Instructions Endovascular Aortic Aneurysm Repair   Please refer to the following instructions for your post-procedure care. Your surgeon or Physician Assistant will discuss any changes with you.   Activity   You are encouraged to walk as much as you can. You can slowly return to normal activities but must avoid strenuous activity and heavy lifting until your doctor tells you it's OK. Avoid activities such as vacuuming or swinging a gold club. It is normal to feel tired for several weeks after your surgery. Do not drive until your doctor gives the OK and you are no longer taking prescription pain medications. It is also normal  to have difficulty with sleep habits, eating, and bowel movements after surgery. These will go away with time.   Bathing/Showering   You may shower after you go home. If you have an incision, do not soak in a bathtub, hot tub, or swim until the incision heals completely.   Incision Care   Shower every day. Clean your incision with mild soap and water. Pat the area dry with a clean towel. You do not need a bandage unless otherwise instructed. Do not apply any ointments or creams to your incision. If you clothing is irritating, you may cover your incision with a dry gauze pad.   Diet   Resume your normal diet. There are no special food restrictions following this procedure.  A low fat/low cholesterol diet is recommended for all patients with vascular disease. In order to heal from your surgery, it is CRITICAL to get adequate nutrition. Your body requires vitamins, minerals, and protein. Vegetables are the best source of vitamins and minerals. Vegetables also provide the perfect balance of protein. Processed food has little nutritional value, so try to avoid this.   Medications   Resume taking all of your medications unless your doctor or Physician Assistnat tells you not to. If your incision is causing pain, you may take over-the-counter pain relievers such as acetaminophen  (Tylenol ). If you were prescribed a stronger pain medication, please be aware these medications can cause nausea and constipation. Prevent nausea by taking the medication with a snack or meal. Avoid constipation by drinking plenty of fluids and eating foods with a high amount of fiber, such as fruits, vegetables, and grains. Do not take Tylenol  if you are taking prescription pain medications.     Follow up   Our office will schedule a follow-up appointment with a C.T. scan 3-4 weeks after your surgery.   Please call us  immediately for any of the following conditions   Severe or worsening pain in your legs or feet or in your abdomen back or chest. Increased pain, redness, drainage (pus) from your incision sit. Increased abdominal pain, bloating, nausea, vomiting or persistent diarrhea. Fever of 101 degrees or higher. Swelling in your leg (s),   Reduce your risk of vascular disease   Stop smoking. If you would like help call [facility] at 1-800-QUIT-NOW (973 433 5395) or [facility] at [phone]. Manage your cholesterol Maintain a desired weight Control your diabetes Keep your blood pressure down   If you have questions, please call the office at [phone].       Prescriptions given: None   Disposition: Home   Patient's condition: is Good   Follow up: 1. Dr. Gretta in 4  weeks with CTA protocol     Teretha Damme, PA-C [facility] [phone] 09/05/2024  8:07 AM     - For VQI Registry use - Post-op:  Time to Extubation: [X]  In OR, [ ]  < 12 hrs, [ ]  12-24 hrs, [ ]  >=24 hrs Vasopressors Req. Post-op: No MI: No., [ ]  Troponin only, [ ]  EKG or Clinical New Arrhythmia: No CHF: No ICU Stay: no days in ICU Transfusion: No             If yes, 0 units given   Complications: Resp failure: No., [ ]  Pneumonia, [ ]  Ventilator Chg in renal function: No., [ ]  Inc. Cr > 0.5, [ ]  Temp. Dialysis,  [ ]  Permanent dialysis Leg ischemia: No., no Surgery needed, [ ]  Yes, Surgery needed,  [ ]  Amputation Bowel ischemia:  No., [ ]  Medical Rx, [ ]  Surgical Rx Wound complication: No., [ ]  Superficial separation/infection, [ ]  Return to OR Return to OR: No        Return to OR for bleeding: No Stroke: No., [ ]  Minor, [ ]  Major   Discharge medications: Statin use:  Yes          ASA use:  No  Plavix use:  No            Beta blocker use:  No            ARB use:  No ACEI use:  Yes CCB use:  Yes

## 2024-09-20 ENCOUNTER — Other Ambulatory Visit (HOSPITAL_COMMUNITY): Payer: Self-pay | Admitting: Urology

## 2024-09-20 DIAGNOSIS — C61 Malignant neoplasm of prostate: Secondary | ICD-10-CM

## 2024-09-25 ENCOUNTER — Other Ambulatory Visit: Payer: Self-pay

## 2024-09-25 DIAGNOSIS — I7143 Infrarenal abdominal aortic aneurysm, without rupture: Secondary | ICD-10-CM

## 2024-10-09 ENCOUNTER — Other Ambulatory Visit: Payer: Self-pay | Admitting: Student

## 2024-10-11 ENCOUNTER — Ambulatory Visit (HOSPITAL_COMMUNITY)
Admission: RE | Admit: 2024-10-11 | Discharge: 2024-10-11 | Disposition: A | Discharge status: Home / Self Care | Source: Ambulatory Visit | Attending: Urology | Admitting: Urology

## 2024-10-11 ENCOUNTER — Encounter (HOSPITAL_COMMUNITY): Payer: Self-pay

## 2024-10-11 ENCOUNTER — Ambulatory Visit (HOSPITAL_COMMUNITY)

## 2024-10-11 DIAGNOSIS — C61 Malignant neoplasm of prostate: Secondary | ICD-10-CM | POA: Diagnosis present

## 2024-10-11 DIAGNOSIS — I7143 Infrarenal abdominal aortic aneurysm, without rupture: Secondary | ICD-10-CM | POA: Insufficient documentation

## 2024-10-11 MED ORDER — IOHEXOL 350 MG/ML SOLN
100.0000 mL | Freq: Once | INTRAVENOUS | Status: AC | PRN
  Administered 2024-10-11: 100 mL via INTRAVENOUS

## 2024-10-11 MED ORDER — TECHNETIUM TC 99M MEDRONATE IV KIT
20.0000 | PACK | Freq: Once | INTRAVENOUS | Status: AC | PRN
  Administered 2024-10-11: 21.2 via INTRAVENOUS

## 2024-10-11 MED ORDER — SODIUM CHLORIDE (PF) 0.9 % IJ SOLN
INTRAMUSCULAR | Status: AC
  Filled 2024-10-11: qty 50

## 2024-10-31 ENCOUNTER — Encounter: Payer: Self-pay | Admitting: Vascular Surgery

## 2024-10-31 ENCOUNTER — Ambulatory Visit: Attending: Vascular Surgery | Admitting: Vascular Surgery

## 2024-10-31 VITALS — BP 142/65 | HR 62 | Temp 97.2°F | Resp 20 | Ht 72.0 in | Wt 200.8 lb

## 2024-10-31 DIAGNOSIS — I7143 Infrarenal abdominal aortic aneurysm, without rupture: Secondary | ICD-10-CM

## 2024-10-31 NOTE — Progress Notes (Signed)
 Patient name: Steve Bailey MRN: [id number] DOB: December 31, 1933 Sex: male  REASON FOR VISIT: Postop after EVAR  HPI: Steve Bailey is a 89 y.o. male with multiple risk factors that presents for postop check after EVAR.  On 08/30/2024 he underwent EVAR for a 5.5 cm abdominal aortic aneurysm.  He reports no new concerns today.  Has done well at home.  Groins healed up without issue.  Past Medical History:  Diagnosis Date   AAA (abdominal aortic aneurysm) followed by dr laurence   last duplex 06/28/2018 4.6 cm,  asymptomatic   Anticoagulant long-term use    eliquis    Benign localized prostatic hyperplasia with lower urinary tract symptoms (LUTS)    CAD (coronary artery disease)    s/p CABG in 1997, patent grafts by cath in 2005, low-risk NST in 2012 and 12/2020   DDD (degenerative disc disease), cervical    Full dentures    History of radiation therapy 11/24/12-01/18/13   Prostate 78Gy/88fx   HOH (hard of hearing)    both   Hyperlipidemia    Hyperlipidemia    Hypertension    Permanent atrial fibrillation (HCC) 09/2012   Phimosis    severe   Proliferative diabetic retinopathy (HCC)    both eyes   Prostate cancer [facility]) urologist-- dr alvaro   dx 09-02-2012-- Stage T2a, Gleason 3+4=7, PSA 8.12, vol 103 cc--- completed external beam radiation 02/ 2014;  recurrent 2015 , started hormone therapy   S/P CABG x 3 1997   Strains to urinate    Type 2 diabetes mellitus treated with insulin  [facility])    endocrinologist-- dr von    Past Surgical History:  Procedure Laterality Date   ABDOMINAL AORTIC ENDOVASCULAR STENT GRAFT N/A 08/30/2024   Procedure: INSERTION, ENDOVASCULAR STENT GRAFT, AORTA, ABDOMINAL;  Surgeon: Gretta Lonni PARAS, MD;  Location: [facility] OR;  Service: Vascular;  Laterality: N/A;   CARDIAC CATHETERIZATION  01-18-2004   dr tisa   patent grafts, ef 55%, minimal anterior hypokinesis,  severe total occlusion of LCFx and LAD,  severe disease of D2 and segmental RCA   CARDIOVASCULAR STRESS  TEST  01/07/2011   normal nuclear study w/ no ischemia/  normal LV function and wall motion,  ef 60%   CARPAL TUNNEL RELEASE Right    CATARACT EXTRACTION W/ INTRAOCULAR LENS  IMPLANT, BILATERAL  left 2006;  right 2010   CIRCUMCISION N/A 07/28/2018   Procedure: CIRCUMCISION ADULT;  Surgeon: [name], MD;  Location: [facility];  Service: Urology;  Laterality: N/A;   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   CORONARY ANGIOPLASTY  1990   LAD   CORONARY ARTERY BYPASS GRAFT  11-1995   shows distal LAD disease, without recurrent symptoms of angina with questionable mild apical ischemia but with normal EF    ESOPHAGOGASTRODUODENOSCOPY (EGD) WITH ESOPHAGEAL DILATION  05/2012   KNEE ARTHROSCOPY Left    TRANSTHORACIC ECHOCARDIOGRAM  10/10/2012   ef 55%, akinesis of the inferolateral wall,  grade 2 diastolic dysfunction/  trivial AR/  mild MR and TR/  mild LAE/  mild RVSF reduced and mild dilated RV    Family History  Problem Relation Age of Onset   Stroke Father    Heart attack Mother    Heart disease Mother    Cancer Sister        lung   Cancer Brother        prostate   Diabetes Brother    Stroke Brother    Stroke Brother  Heart disease Brother    Heart disease Brother    Heart attack Sister    Heart disease Sister    Heart attack Sister    Cancer Sister        breast   Heart disease Sister    Heart attack Sister    Heart disease Sister    Heart disease Sister     SOCIAL HISTORY: Social History   Tobacco Use   Smoking status: Former    Current packs/day: 0.00    Types: Cigarettes    Start date: 10/14/1959    Quit date: 10/14/1979    Years since quitting: 45.0   Smokeless tobacco: Former    Types: Chew    Quit date: 11/23/2012  Substance Use Topics   Alcohol use: No    Allergies  Allergen Reactions   Cephalexin  Nausea And Vomiting   Tramadol  Palpitations    Current Outpatient Medications  Medication Sig Dispense Refill   acetaminophen  (TYLENOL )  650 MG CR tablet Take 1,300 mg by mouth at bedtime.     alfuzosin  (UROXATRAL ) 10 MG 24 hr tablet Take 10 mg by mouth at bedtime. Reported on 12/18/2015     amLODipine  (NORVASC ) 10 MG tablet TAKE 1 TABLET DAILY 90 tablet 3   atorvastatin  (LIPITOR) 40 MG tablet TAKE 1 TABLET DAILY 90 tablet 3   Blood Glucose Monitoring Suppl (FREESTYLE LITE) w/Device KIT 1 Device by Does not apply route 2 (two) times daily. 1 kit 0   Cinnamon 500 MG capsule Take 500 mg by mouth 2 (two) times daily.     Continuous Glucose Sensor (FREESTYLE LIBRE 3 SENSOR) MISC 1 each by Does not apply route every 14 (fourteen) days. 6 each 1   ELIQUIS  5 MG TABS tablet TAKE 1 TABLET TWICE A DAY 180 tablet 3   ezetimibe  (ZETIA ) 10 MG tablet TAKE 1 TABLET DAILY 90 tablet 3   ferrous sulfate 325 (65 FE) MG tablet Take 325 mg by mouth every other day.     finasteride  (PROSCAR ) 5 MG tablet Take 5 mg by mouth every morning.     Glucagon  (GVOKE HYPOPEN  1-PACK) 1 MG/0.2ML SOAJ Inject 1 mg into the skin as needed (low blood sugar with impaired consciousness). 0.4 mL 2   glucose blood (FREESTYLE LITE) test strip USE TO TEST TWICE A DAY 100 strip 2   Insulin  Lispro Prot & Lispro (HUMALOG  MIX 75/25 KWIKPEN) (75-25) 100 UNIT/ML Kwikpen 15 units with breakfast and 6-8 units with supper. 30 mL 4   Insulin  Pen Needle (PEN NEEDLES) 31G X 5 MM MISC 1 each by Does not apply route 2 (two) times daily. 180 each 2   lisinopril  (ZESTRIL ) 20 MG tablet TAKE 1 TABLET DAILY 90 tablet 3   nitroGLYCERIN  (NITROSTAT ) 0.4 MG SL tablet Place 1 tablet (0.4 mg total) under the tongue every 5 (five) minutes as needed for chest pain. 25 tablet 3   Omega-3 Fatty Acids (OMEGA-3 FISH OIL PO) Take 1 capsule by mouth daily.     pantoprazole  (PROTONIX ) 40 MG tablet TAKE 1 TABLET DAILY 90 tablet 3   prednisoLONE acetate (PRED FORTE) 1 % ophthalmic suspension Place 1 drop into the right eye in the morning and at bedtime.     sodium chloride  (MURO 128) 5 % ophthalmic solution  Place 1 drop into the right eye in the morning, at noon, in the evening, and at bedtime.     No current facility-administered medications for this visit.    REVIEW OF  SYSTEMS:  [X]  denotes positive finding, [ ]  denotes negative finding Cardiac  Comments:  Chest pain or chest pressure:    Shortness of breath upon exertion:    Short of breath when lying flat:    Irregular heart rhythm:        Vascular    Pain in calf, thigh, or hip brought on by ambulation:    Pain in feet at night that wakes you up from your sleep:     Blood clot in your veins:    Leg swelling:         Pulmonary    Oxygen at home:    Productive cough:     Wheezing:         Neurologic    Sudden weakness in arms or legs:     Sudden numbness in arms or legs:     Sudden onset of difficulty speaking or slurred speech:    Temporary loss of vision in one eye:     Problems with dizziness:         Gastrointestinal    Blood in stool:     Vomited blood:         Genitourinary    Burning when urinating:     Blood in urine:        Psychiatric    Major depression:         Hematologic    Bleeding problems:    Problems with blood clotting too easily:        Skin    Rashes or ulcers:        Constitutional    Fever or chills:      PHYSICAL EXAM: Vitals:   10/31/24 1057  BP: (!) 142/65  Pulse: 62  Resp: 20  Temp: (!) 97.2 F (36.2 C)  TempSrc: Temporal  SpO2: 100%  Weight: 200 lb 12.8 oz (91.1 kg)  Height: 6' (1.829 m)    GENERAL: The patient is a well-nourished male, in no acute distress. The vital signs are documented above. CARDIAC: There is a regular rate and rhythm.  VASCULAR:  Bilateral femoral pulses palpable Feet are warm PULMONARY: No respiratory distress. ABDOMEN: Soft and non-tender. MUSCULOSKELETAL: There are no major deformities or cyanosis. NEUROLOGIC: No focal weakness or paresthesias are detected. PSYCHIATRIC: The patient has a normal affect.  DATA:   CTA reviewed from  10/11/2024 with aortobiiliac stent graft in good position with a type II endoleak likely from lumbar  Assessment/Plan:   89 y.o. male with multiple risk factors that presents for postop check after EVAR.  On 08/30/2024 he underwent EVAR for a 5.5 cm abdominal aortic aneurysm.  Discussed CT looks good with excellent position of the stent graft.  There is a type II endoleak likely from likely lumbar artery off the aneurysm and discussed these are common in up to 25% of cases.  No further intervention required unless there is significant growth in the aneurysm sac.  Will follow-up in 6 months with EVAR duplex.   Lonni DOROTHA Gaskins, MD [facility] [place] Office: [phone]

## 2024-11-01 ENCOUNTER — Other Ambulatory Visit: Payer: Self-pay

## 2024-11-01 DIAGNOSIS — I7143 Infrarenal abdominal aortic aneurysm, without rupture: Secondary | ICD-10-CM

## 2024-11-09 ENCOUNTER — Ambulatory Visit (INDEPENDENT_AMBULATORY_CARE_PROVIDER_SITE_OTHER): Admitting: Endocrinology

## 2024-11-09 ENCOUNTER — Encounter: Payer: Self-pay | Admitting: Endocrinology

## 2024-11-09 ENCOUNTER — Ambulatory Visit: Payer: Self-pay | Admitting: Endocrinology

## 2024-11-09 VITALS — BP 150/70 | Ht 72.0 in | Wt 201.0 lb

## 2024-11-09 DIAGNOSIS — Z794 Long term (current) use of insulin: Secondary | ICD-10-CM | POA: Diagnosis not present

## 2024-11-09 DIAGNOSIS — E1169 Type 2 diabetes mellitus with other specified complication: Secondary | ICD-10-CM | POA: Diagnosis not present

## 2024-11-09 LAB — POCT GLYCOSYLATED HEMOGLOBIN (HGB A1C): Hemoglobin A1C: 6.4 % — AB (ref 4.0–5.6)

## 2024-11-09 NOTE — Progress Notes (Unsigned)
 Outpatient Endocrinology Note Ladon Heney, MD  11/09/2024  Patient's Name: Steve Bailey    DOB: 15-Aug-1934    MRN: [id number]                                                    REASON OF VISIT: Follow up of type 2 diabetes mellitus  PCP: Lazoff, Shawn P, DO  HISTORY OF PRESENT ILLNESS:        Steve Bailey is a 89 y.o. old male with past medical history listed below, is here for follow up for type 2 diabetes mellitus.   Pertinent Diabetes History: Patient was diagnosed with type 2 diabetes mellitus in 1995.  He is on insulin  therapy and has controlled type 2 diabetes mellitus.  Chronic Diabetes Complications : Retinopathy: yes, proliferative diabetic retinopathy bilateral eyes. Following with ophthalmology regularly, [facility].  He has corneal dystrophy and macular edema as well as retinopathy. Nephropathy: no, on ACE/ARB /lisinopril . Peripheral neuropathy: yes, numbness / .  Coronary artery disease: yes, s/p CABG in 1997.  Stroke: no  Relevant comorbidities and cardiovascular risk factors: Obesity: no Body mass index is 27.26 kg/m.  Hypertension: Yes  Hyperlipidemia : Yes, on statin   Current / Home Diabetic regimen includes: Humalog  mix 75/25 KwikPen 15 units with breakfast- 6 - 10 units with supper.  Prior diabetic medications: Metformin  extended release was stopped due to abdominal discomfort.  Glycemic data:    CONTINUOUS GLUCOSE MONITORING SYSTEM (CGMS) INTERPRETATION: At today's visit, we reviewed CGM downloads. The full report is scanned in the media. Reviewing the CGM trends, blood glucose are as follows:  FreeStyle Libre 3 CGM-  Sensor Download (Sensor download was reviewed and summarized below.) Dates: September 3 to August 08, 2024, 14 days. Sensor Average: 136  Glucose Management Indicator: 6.6%  % data captured: 97%    Previous:    Interpretation: Mostly acceptable blood sugar.  He has trending down low normal and rarely blood sugar  in 60s in the early morning.  Blood sugar during the afternoon and in the evening are acceptable.  No concerning hypoglycemia.  Percentages of hypoglycemia has decreased compared to last visit.  Hypoglycemia: Patient has minor hypoglycemic episodes. Patient has hypoglycemia awareness.  Factors modifying glucose control: 1.  Diabetic diet assessment: 3 meals a day.  2.  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  3.  Medication compliance: compliant all of the time.  # Patient and daughter has question about if insulin  pump would be appropriate for him.  Discussed about how pumps works, it still require involvement of the patient actively including insulin  bolusing for meals.  At this time he has been requiring help from the daughter to change sensor site.  Discussed that probably the current regimen of insulin  twice a day would be the best option for him.  Patient and daughter agreed not to consider for insulin  pump therapy at this time.  As discussed in visit in March 2025.  # History of hypothyroidism : Patient used to take levothyroxine  25 mcg daily based on chart review was stopped in 2019 and was started on 2013.  Patient remained euthyroid without thyroid  medication in the past.  Interval history  Patient had lab on August 01, 2024 at [facility], reviewed at [facility] hemoglobin A1c was 6.3%.  CGM data as reviewed above.  He has rare mild hypoglycemia in the early morning otherwise mostly acceptable blood sugar.  Diabetes regimen as reviewed above.  He has been taking insulin  15 units in the morning however he takes 6 to 10 units of insulin  with supper based on blood sugar level and meal size.  On the lab on September 9 at outside facility at [facility] he had TSH of 5.577 with upper normal limit of 5.330.  He has no new symptoms.  He is accompanied by daughter in the clinic today.  REVIEW OF SYSTEMS As per history of present illness.   PAST MEDICAL  HISTORY: Past Medical History:  Diagnosis Date   AAA (abdominal aortic aneurysm) followed by dr laurence   last duplex 06/28/2018 4.6 cm,  asymptomatic   Anticoagulant long-term use    eliquis    Benign localized prostatic hyperplasia with lower urinary tract symptoms (LUTS)    CAD (coronary artery disease)    s/p CABG in 1997, patent grafts by cath in 2005, low-risk NST in 2012 and 12/2020   DDD (degenerative disc disease), cervical    Full dentures    History of radiation therapy 11/24/12-01/18/13   Prostate 78Gy/30fx   HOH (hard of hearing)    both   Hyperlipidemia    Hyperlipidemia    Hypertension    Permanent atrial fibrillation (HCC) 09/2012   Phimosis    severe   Proliferative diabetic retinopathy (HCC)    both eyes   Prostate cancer [facility]) urologist-- dr alvaro   dx 09-02-2012-- Stage T2a, Gleason 3+4=7, PSA 8.12, vol 103 cc--- completed external beam radiation 02/ 2014;  recurrent 2015 , started hormone therapy   S/P CABG x 3 1997   Strains to urinate    Type 2 diabetes mellitus treated with insulin  [facility])    endocrinologist-- dr von    PAST SURGICAL HISTORY: Past Surgical History:  Procedure Laterality Date   ABDOMINAL AORTIC ENDOVASCULAR STENT GRAFT N/A 08/30/2024   Procedure: INSERTION, ENDOVASCULAR STENT GRAFT, AORTA, ABDOMINAL;  Surgeon: Gretta Lonni PARAS, MD;  Location: [facility] OR;  Service: Vascular;  Laterality: N/A;   CARDIAC CATHETERIZATION  01-18-2004   dr tisa   patent grafts, ef 55%, minimal anterior hypokinesis,  severe total occlusion of LCFx and LAD,  severe disease of D2 and segmental RCA   CARDIOVASCULAR STRESS TEST  01/07/2011   normal nuclear study w/ no ischemia/  normal LV function and wall motion,  ef 60%   CARPAL TUNNEL RELEASE Right    CATARACT EXTRACTION W/ INTRAOCULAR LENS  IMPLANT, BILATERAL  left 2006;  right 2010   CIRCUMCISION N/A 07/28/2018   Procedure: CIRCUMCISION ADULT;  Surgeon: [name], MD;  Location[facility];   Service: Urology;  Laterality: N/A;   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   CORONARY ANGIOPLASTY  1990   LAD   CORONARY ARTERY BYPASS GRAFT  11-1995   shows distal LAD disease, without recurrent symptoms of angina with questionable mild apical ischemia but with normal EF    ESOPHAGOGASTRODUODENOSCOPY (EGD) WITH ESOPHAGEAL DILATION  05/2012   KNEE ARTHROSCOPY Left    TRANSTHORACIC ECHOCARDIOGRAM  10/10/2012   ef 55%, akinesis of the inferolateral wall,  grade 2 diastolic dysfunction/  trivial AR/  mild MR and TR/  mild LAE/  mild RVSF reduced and mild dilated RV    ALLERGIES: Allergies  Allergen Reactions   Cephalexin  Nausea And Vomiting   Tramadol  Palpitations    FAMILY HISTORY:  Family History  Problem Relation Age of Onset   Stroke Father    Heart attack Mother    Heart disease Mother    Cancer Sister        lung   Cancer Brother        prostate   Diabetes Brother    Stroke Brother    Stroke Brother    Heart disease Brother    Heart disease Brother    Heart attack Sister    Heart disease Sister    Heart attack Sister    Cancer Sister        breast   Heart disease Sister    Heart attack Sister    Heart disease Sister    Heart disease Sister     SOCIAL HISTORY: Social History   Socioeconomic History   Marital status: Widowed    Spouse name: Not on file   Number of children: 1   Years of education: Not on file   Highest education level: Not on file  Occupational History   Not on file  Tobacco Use   Smoking status: Former    Current packs/day: 0.00    Types: Cigarettes    Start date: 10/14/1959    Quit date: 10/14/1979    Years since quitting: 45.1   Smokeless tobacco: Former    Types: Chew    Quit date: 11/23/2012  Vaping Use   Vaping status: Never Used  Substance and Sexual Activity   Alcohol use: No   Drug use: No   Sexual activity: Not on file  Other Topics Concern   Not on file  Social History Narrative   Not on file   Social Drivers of  Health   Tobacco Use: Medium Risk (11/09/2024)   Patient History    Smoking Tobacco Use: Former    Smokeless Tobacco Use: Former    Passive Exposure: Not on Actuary Strain: Not on file  Food Insecurity: Unknown (08/01/2024)   Received from [facility]   Epic    Within the past 12 months, you worried that your food would run out before you got money to buy more: Patient declined to answer    Within the past 12 months, the food you bought just didn't last and you didn't have money to get more. : Patient declined to answer  Transportation Needs: Not on file (08/01/2024)  Physical Activity: Not on file  Stress: Not on file  Social Connections: Not on file  Depression (EYV7-0): Not on file  Alcohol Screen: Not on file  Housing: Low Risk (08/01/2024)   Received from [facility]   Epic    What is your living situation today?: Pt declined to answer    Think about the place you live. Do you have problems with any of the following? Choose all that apply:: None/None on this list  Utilities: Unknown (08/01/2024)   Received from [facility]   Utilities    In the past 12 months has the electric, gas, oil, or water company threatened to shut off services in your home? : Patient declined to answer  Health Literacy: Not on file    MEDICATIONS:  Current Outpatient Medications  Medication Sig Dispense Refill   acetaminophen  (TYLENOL ) 650 MG CR tablet Take 1,300 mg by mouth at bedtime.     alfuzosin  (UROXATRAL ) 10 MG 24 hr tablet Take 10 mg by mouth at bedtime. Reported on 12/18/2015     amLODipine  (NORVASC ) 10 MG tablet TAKE 1 TABLET DAILY 90  tablet 3   atorvastatin  (LIPITOR) 40 MG tablet TAKE 1 TABLET DAILY 90 tablet 3   Blood Glucose Monitoring Suppl (FREESTYLE LITE) w/Device KIT 1 Device by Does not apply route 2 (two) times daily. 1 kit 0   Cinnamon 500 MG capsule Take 500 mg by mouth 2 (two) times daily.     Continuous Glucose Sensor (FREESTYLE LIBRE 3 SENSOR) MISC 1 each by  Does not apply route every 14 (fourteen) days. 6 each 1   ELIQUIS  5 MG TABS tablet TAKE 1 TABLET TWICE A DAY 180 tablet 3   ezetimibe  (ZETIA ) 10 MG tablet TAKE 1 TABLET DAILY 90 tablet 3   ferrous sulfate 325 (65 FE) MG tablet Take 325 mg by mouth every other day.     finasteride  (PROSCAR ) 5 MG tablet Take 5 mg by mouth every morning.     Glucagon  (GVOKE HYPOPEN  1-PACK) 1 MG/0.2ML SOAJ Inject 1 mg into the skin as needed (low blood sugar with impaired consciousness). 0.4 mL 2   glucose blood (FREESTYLE LITE) test strip USE TO TEST TWICE A DAY 100 strip 2   Insulin  Lispro Prot & Lispro (HUMALOG  MIX 75/25 KWIKPEN) (75-25) 100 UNIT/ML Kwikpen 15 units with breakfast and 6-8 units with supper. 30 mL 4   Insulin  Pen Needle (PEN NEEDLES) 31G X 5 MM MISC 1 each by Does not apply route 2 (two) times daily. 180 each 2   lisinopril  (ZESTRIL ) 20 MG tablet TAKE 1 TABLET DAILY 90 tablet 3   nitroGLYCERIN  (NITROSTAT ) 0.4 MG SL tablet Place 1 tablet (0.4 mg total) under the tongue every 5 (five) minutes as needed for chest pain. 25 tablet 3   Omega-3 Fatty Acids (OMEGA-3 FISH OIL PO) Take 1 capsule by mouth daily.     pantoprazole  (PROTONIX ) 40 MG tablet TAKE 1 TABLET DAILY 90 tablet 3   prednisoLONE acetate (PRED FORTE) 1 % ophthalmic suspension Place 1 drop into the right eye in the morning and at bedtime.     sodium chloride  (MURO 128) 5 % ophthalmic solution Place 1 drop into the right eye in the morning, at noon, in the evening, and at bedtime.     No current facility-administered medications for this visit.    PHYSICAL EXAM: Vitals:   11/09/24 1545  BP: (!) 150/70  Weight: 201 lb (91.2 kg)  Height: 6' (1.829 m)    Body mass index is 27.26 kg/m.  Wt Readings from Last 3 Encounters:  11/09/24 201 lb (91.2 kg)  10/31/24 200 lb 12.8 oz (91.1 kg)  08/30/24 196 lb 3.4 oz (89 kg)    General: Well developed, well nourished male in no apparent distress.  HEENT: AT/[place], no external lesions. Hearing  impairment.  Eyes: Conjunctiva clear and no icterus. Neck: Neck supple  Lungs: Respirations not labored Neurologic: Alert, oriented, normal speech Extremities / Skin: Dry.   Psychiatric: Does not appear depressed or anxious  Diabetic Foot Exam - Simple   Simple Foot Form Diabetic Foot exam was performed with the following findings: Yes 11/09/2024  4:09 PM  Visual Inspection No deformities, no ulcerations, no other skin breakdown bilaterally: Yes See comments: Yes Sensation Testing Intact to touch and monofilament testing bilaterally: Yes Pulse Check Posterior Tibialis and Dorsalis pulse intact bilaterally: Yes Comments Dystrophic nails+, no ulcer. Callus b/l.     LABS Reviewed Lab Results  Component Value Date   HGBA1C 6.4 (A) 11/09/2024   HGBA1C 6.3 (A) 05/01/2024   HGBA1C 6.0 (A) 02/01/2024   Lab  Results  Component Value Date   FRUCTOSAMINE 251 04/27/2019   Lab Results  Component Value Date   CHOL 150 01/07/2023   HDL 51.80 01/07/2023   LDLCALC 78 01/07/2023   TRIG 101.0 01/07/2023   CHOLHDL 3 01/07/2023   Lab Results  Component Value Date   MICRALBCREAT 70 (H) 05/01/2024   Lab Results  Component Value Date   CREATININE 1.40 (H) 08/31/2024   Lab Results  Component Value Date   GFR 50.61 (L) 01/07/2023    ASSESSMENT / PLAN  1. Type 2 diabetes mellitus with other specified complication, with long-term current use of insulin  (HCC)     Diabetes Mellitus type 2, complicated by diabetic retinopathy and neuropathy. - Diabetic status / severity: Controlled with rare hypoglycemia.  Lab Results  Component Value Date   HGBA1C 6.4 (A) 11/09/2024    - Hemoglobin A1c goal : <7.5%  It is okay to have permissive hyperglycemia with hemoglobin A1c close to 7.5%, to avoid hypoglycemia.  Hypoglycemia has decreased after gradually decreasing dose of insulin .   Medications: See below.  Discussed about taking less insulin  for the supper to avoid early morning or  overnight hypoglycemia.  Discussed that it is okay to have blood sugar in the range of 100 - 150 overnight and early morning.  Diabetes regimen: Humalog  mix 75/25 insulin  continue 15 units with breakfast and decrease 6- 10 units to 6 -8 units with supper.  Advised to take less Humalog  in the morning when there is anticipated increased physical activity or skipping meal in the afternoon.  - Home glucose testing: CGM Freestyle libre 3 and check as needed.  - Discussed/ Gave Hypoglycemia treatment plan.  Always carry glucose and take glucose tablets to correct hypoglycemia may take 2 to 4 tablets at a time.  Glucagon  Emergency Kit prescribed.  Discussed about optimal glucose around 100-150 range in the morning fasting and in between the meals and up to 200 range after meals.  # Consult : Refer to podiatry.  # Annual urine for microalbuminuria/ creatinine ratio, + microalbuminuria currently.  Currently on lisinopril .  Will continue to monitor.   Last  Lab Results  Component Value Date   MICRALBCREAT 70 (H) 05/01/2024    # Foot check nightly / neuropathy.  Will refer to podiatry.  # Patient had diabetic retinopathy, regularly following with ophthalmology at [facility].  2. Blood pressure  -  BP Readings from Last 1 Encounters:  11/09/24 (!) 150/70    - Control is in target.  - No change in current plans.    3. Lipid status / Hyperlipidemia - Last  Lab Results  Component Value Date   LDLCALC 78 01/07/2023   - Continue atorvastatin  40 mg daily.  Managed by PCP/cardiology.  # Elevated TSH -He had mildly elevated TSH recently.  It is appropriate physiologically based on his age.  No thyroid  hormone replacement is required.  Saquan was seen today for diabetes.  Diagnoses and all orders for this visit:  Type 2 diabetes mellitus with other specified complication, with long-term current use of insulin  (HCC) -     POCT glycosylated hemoglobin (Hb A1C)    DISPOSITION Follow  up in clinic in 3 months suggested.   All questions answered and patient verbalized understanding of the plan.  Chaz Ronning, MD [facility] Endocrinology [facility] [address] [place], [address] [place], [place] [address] Phone # [phone]  At least part of this note was generated using voice recognition software.  Inadvertent word errors may have occurred, which were not recognized during the proofreading process.

## 2024-11-09 NOTE — Patient Instructions (Addendum)
 Diabetes regimen: Stay on Humalog  mix 75/25 take 10-15 units with breakfast and 8-10 units with supper. No more than 30 units/day.

## 2024-12-26 ENCOUNTER — Telehealth (HOSPITAL_BASED_OUTPATIENT_CLINIC_OR_DEPARTMENT_OTHER): Payer: Self-pay

## 2024-12-26 DIAGNOSIS — Z01818 Encounter for other preprocedural examination: Secondary | ICD-10-CM

## 2024-12-26 NOTE — Telephone Encounter (Signed)
 Please order CBC and BMP. Will need before pharmacy can weigh in on Eliquis .  He does have an appointment with Laymon Qua, PA on 01/12/25 that can be for pre op clearance.    Thanks Yarelly Kuba

## 2024-12-26 NOTE — Telephone Encounter (Signed)
"  ° °  Pre-operative Risk Assessment    Patient Name: Steve Bailey  DOB: [birth year] MRN: [id number]   Date of last office visit: 08/17/2024 with Orren Fabry, PA-C Date of next office visit: 01/12/2025 with Laymon Qua, PA-C  Request for Surgical Clearance    Procedure:  Partial cornea transplant OD (right eye)   Date of Surgery:  Clearance 01/18/25                                   Surgeon:  Dr. Adina Sickles Surgeon's Group or Practice Name:  [facility] Department of Opthalmology  Phone number:  [phone] Fax number:  [phone]   Type of Clearance Requested:   - Medical  - Pharmacy:  Hold Apixaban  (Eliquis ) Request stopping: 3 days prior; Date could be stopped: midnight following surgery   Type of Anesthesia:  Not Indicated   Additional requests/questions:  None  Signed, Patrcia Iverson CROME   12/26/2024, 7:52 AM   "

## 2024-12-26 NOTE — Telephone Encounter (Signed)
 DPR ok to s/w the pt's daughter Dickey Goldberg. We discussed about the pt needing labs to be done for preop clearance before his appt 01/12/25 with Laymon Qua, PAC. I gave Dickey the 2 locations in [place] for [employer]. I will place the orders and release to [employer]. Dickey has been informed the labs are not fasting so her dad can eat and drink prior to his lab work.   We agreed that the pt will get labs done before mid next week.

## 2024-12-28 ENCOUNTER — Ambulatory Visit: Payer: Self-pay | Admitting: Pharmacist

## 2024-12-28 LAB — BASIC METABOLIC PANEL WITH GFR
BUN/Creatinine Ratio: 16 (ref 10–24)
BUN: 23 mg/dL (ref 8–27)
CO2: 22 mmol/L (ref 20–29)
Calcium: 9 mg/dL (ref 8.6–10.2)
Chloride: 104 mmol/L (ref 96–106)
Creatinine, Ser: 1.42 mg/dL — ABNORMAL HIGH (ref 0.76–1.27)
Glucose: 177 mg/dL — ABNORMAL HIGH (ref 70–99)
Potassium: 4.9 mmol/L (ref 3.5–5.2)
Sodium: 140 mmol/L (ref 134–144)
eGFR: 48 mL/min/{1.73_m2} — ABNORMAL LOW

## 2024-12-28 LAB — CBC WITH DIFFERENTIAL/PLATELET
Basophils Absolute: 0 10*3/uL (ref 0.0–0.2)
Basos: 0 %
EOS (ABSOLUTE): 0.1 10*3/uL (ref 0.0–0.4)
Eos: 1 %
Hematocrit: 34.2 % — ABNORMAL LOW (ref 37.5–51.0)
Hemoglobin: 11.2 g/dL — ABNORMAL LOW (ref 13.0–17.7)
Immature Grans (Abs): 0 10*3/uL (ref 0.0–0.1)
Immature Granulocytes: 0 %
Lymphocytes Absolute: 1 10*3/uL (ref 0.7–3.1)
Lymphs: 22 %
MCH: 33.7 pg — ABNORMAL HIGH (ref 26.6–33.0)
MCHC: 32.7 g/dL (ref 31.5–35.7)
MCV: 103 fL — ABNORMAL HIGH (ref 79–97)
Monocytes Absolute: 0.3 10*3/uL (ref 0.1–0.9)
Monocytes: 6 %
Neutrophils Absolute: 3.3 10*3/uL (ref 1.4–7.0)
Neutrophils: 71 %
Platelets: 159 10*3/uL (ref 150–450)
RBC: 3.32 x10E6/uL — ABNORMAL LOW (ref 4.14–5.80)
RDW: 13.4 % (ref 11.6–15.4)
WBC: 4.7 10*3/uL (ref 3.4–10.8)

## 2024-12-28 NOTE — Telephone Encounter (Signed)
 Patient with diagnosis of afib on Eliquis  for anticoagulation.    Procedure: Partial cornea transplant OD (right eye)  Date of procedure: 01/18/25   CHA2DS2-VASc Score = 5   This indicates a 7.2% annual risk of stroke. The patient's score is based upon: CHF History: 0 HTN History: 1 Diabetes History: 1 Stroke History: 0 Vascular Disease History: 1 Age Score: 2 Gender Score: 0      CrCl 46 ml/min Platelet count 159  Patient has not had an Afib/aflutter ablation in the last 3 months, DCCV within the last 4 weeks or a watchman implanted in the last 45 days   Per office protocol, patient can hold Eliquis  for 2 days prior to procedure.    **This guidance is not considered finalized until pre-operative APP has relayed final recommendations.**

## 2025-01-12 ENCOUNTER — Ambulatory Visit: Admitting: Student

## 2025-01-19 ENCOUNTER — Ambulatory Visit: Admitting: Student

## 2025-02-07 ENCOUNTER — Ambulatory Visit: Admitting: Endocrinology

## 2025-05-01 ENCOUNTER — Ambulatory Visit: Admitting: Vascular Surgery

## 2025-05-01 ENCOUNTER — Ambulatory Visit (HOSPITAL_COMMUNITY)
